# Patient Record
Sex: Male | Born: 1950
Health system: Southern US, Community
[De-identification: ages and names within clinical notes are randomized; demographics above are authoritative.]

## PROBLEM LIST (undated history)

## (undated) DIAGNOSIS — E119 Type 2 diabetes mellitus without complications: Secondary | ICD-10-CM

## (undated) DIAGNOSIS — I10 Essential (primary) hypertension: Secondary | ICD-10-CM

## (undated) DIAGNOSIS — I255 Ischemic cardiomyopathy: Secondary | ICD-10-CM

## (undated) DIAGNOSIS — Z1159 Encounter for screening for other viral diseases: Secondary | ICD-10-CM

## (undated) DIAGNOSIS — I25119 Atherosclerotic heart disease of native coronary artery with unspecified angina pectoris: Secondary | ICD-10-CM

## (undated) DIAGNOSIS — K219 Gastro-esophageal reflux disease without esophagitis: Secondary | ICD-10-CM

## (undated) DIAGNOSIS — E7800 Pure hypercholesterolemia, unspecified: Secondary | ICD-10-CM

## (undated) DIAGNOSIS — N400 Enlarged prostate without lower urinary tract symptoms: Secondary | ICD-10-CM

## (undated) DIAGNOSIS — Z87442 Personal history of urinary calculi: Secondary | ICD-10-CM

## (undated) DIAGNOSIS — R310 Gross hematuria: Secondary | ICD-10-CM

## (undated) DIAGNOSIS — E785 Hyperlipidemia, unspecified: Secondary | ICD-10-CM

## (undated) DIAGNOSIS — Z951 Presence of aortocoronary bypass graft: Secondary | ICD-10-CM

## (undated) DIAGNOSIS — I2510 Atherosclerotic heart disease of native coronary artery without angina pectoris: Secondary | ICD-10-CM

## (undated) DIAGNOSIS — C801 Malignant (primary) neoplasm, unspecified: Secondary | ICD-10-CM

## (undated) DIAGNOSIS — R351 Nocturia: Secondary | ICD-10-CM

## (undated) DIAGNOSIS — N200 Calculus of kidney: Secondary | ICD-10-CM

## (undated) DIAGNOSIS — R350 Frequency of micturition: Secondary | ICD-10-CM

## (undated) DIAGNOSIS — Z87898 Personal history of other specified conditions: Secondary | ICD-10-CM

## (undated) DIAGNOSIS — I252 Old myocardial infarction: Secondary | ICD-10-CM

## (undated) DIAGNOSIS — I509 Heart failure, unspecified: Secondary | ICD-10-CM

## (undated) DIAGNOSIS — I219 Acute myocardial infarction, unspecified: Secondary | ICD-10-CM

## (undated) DIAGNOSIS — Z8679 Personal history of other diseases of the circulatory system: Secondary | ICD-10-CM

---

## 1974-09-12 HISTORY — PX: URETEROLITHOTOMY: SHX71

## 1976-09-12 HISTORY — PX: KNEE ARTHROSCOPY: SUR90

## 1977-09-12 HISTORY — PX: PERCUTANEOUS NEPHROSTOLITHOTOMY: SHX2207

## 1999-10-08 HISTORY — PX: OTHER SURGICAL HISTORY: SHX169

## 2000-09-12 HISTORY — PX: OTHER SURGICAL HISTORY: SHX169

## 2001-03-21 ENCOUNTER — Encounter: Admission: RE | Admit: 2001-03-21 | Discharge: 2001-03-21 | Payer: Self-pay | Admitting: Family Medicine

## 2001-03-21 ENCOUNTER — Encounter: Payer: Self-pay | Admitting: Family Medicine

## 2004-09-12 DIAGNOSIS — Z951 Presence of aortocoronary bypass graft: Secondary | ICD-10-CM

## 2004-09-12 HISTORY — DX: Presence of aortocoronary bypass graft: Z95.1

## 2004-10-11 ENCOUNTER — Ambulatory Visit (HOSPITAL_BASED_OUTPATIENT_CLINIC_OR_DEPARTMENT_OTHER): Admission: RE | Admit: 2004-10-11 | Discharge: 2004-10-11 | Payer: Self-pay | Admitting: Orthopedic Surgery

## 2004-10-11 ENCOUNTER — Ambulatory Visit (HOSPITAL_COMMUNITY): Admission: RE | Admit: 2004-10-11 | Discharge: 2004-10-11 | Payer: Self-pay | Admitting: Orthopedic Surgery

## 2004-10-11 HISTORY — PX: SHOULDER ARTHROSCOPY W/ SUBACROMIAL DECOMPRESSION AND DISTAL CLAVICLE EXCISION: SHX2401

## 2005-04-12 HISTORY — PX: CORONARY ARTERY BYPASS GRAFT: SHX141

## 2005-05-19 ENCOUNTER — Encounter (HOSPITAL_COMMUNITY): Admission: RE | Admit: 2005-05-19 | Discharge: 2005-08-17 | Payer: Self-pay | Admitting: Cardiology

## 2005-09-02 ENCOUNTER — Encounter: Admission: RE | Admit: 2005-09-02 | Discharge: 2005-09-02 | Payer: Self-pay | Admitting: Family Medicine

## 2005-09-12 DIAGNOSIS — Z8679 Personal history of other diseases of the circulatory system: Secondary | ICD-10-CM

## 2005-09-12 HISTORY — DX: Personal history of other diseases of the circulatory system: Z86.79

## 2007-11-02 ENCOUNTER — Emergency Department (HOSPITAL_COMMUNITY): Admission: EM | Admit: 2007-11-02 | Discharge: 2007-11-02 | Payer: Self-pay | Admitting: Emergency Medicine

## 2008-01-17 HISTORY — PX: CARDIAC CATHETERIZATION: SHX172

## 2008-01-29 ENCOUNTER — Encounter: Admission: RE | Admit: 2008-01-29 | Discharge: 2008-01-29 | Payer: Self-pay | Admitting: Sports Medicine

## 2008-02-22 ENCOUNTER — Encounter: Admission: RE | Admit: 2008-02-22 | Discharge: 2008-02-22 | Payer: Self-pay | Admitting: Sports Medicine

## 2008-08-26 ENCOUNTER — Ambulatory Visit (HOSPITAL_COMMUNITY): Admission: RE | Admit: 2008-08-26 | Discharge: 2008-08-26 | Payer: Self-pay | Admitting: Orthopedic Surgery

## 2008-09-10 ENCOUNTER — Inpatient Hospital Stay (HOSPITAL_COMMUNITY): Admission: RE | Admit: 2008-09-10 | Discharge: 2008-09-11 | Payer: Self-pay | Admitting: Orthopedic Surgery

## 2008-09-10 HISTORY — PX: LUMBAR FUSION: SHX111

## 2009-05-23 ENCOUNTER — Emergency Department (HOSPITAL_COMMUNITY): Admission: EM | Admit: 2009-05-23 | Discharge: 2009-05-23 | Payer: Self-pay | Admitting: Emergency Medicine

## 2009-05-26 ENCOUNTER — Ambulatory Visit (HOSPITAL_COMMUNITY): Admission: RE | Admit: 2009-05-26 | Discharge: 2009-05-26 | Payer: Self-pay | Admitting: Urology

## 2009-10-07 ENCOUNTER — Ambulatory Visit (HOSPITAL_COMMUNITY): Admission: RE | Admit: 2009-10-07 | Discharge: 2009-10-07 | Payer: Self-pay | Admitting: Urology

## 2009-10-15 ENCOUNTER — Ambulatory Visit (HOSPITAL_COMMUNITY): Admission: RE | Admit: 2009-10-15 | Discharge: 2009-10-15 | Payer: Self-pay | Admitting: Urology

## 2009-10-15 HISTORY — PX: EXTRACORPOREAL SHOCK WAVE LITHOTRIPSY: SHX1557

## 2010-01-14 ENCOUNTER — Ambulatory Visit (HOSPITAL_COMMUNITY): Admission: RE | Admit: 2010-01-14 | Discharge: 2010-01-14 | Payer: Self-pay | Admitting: Urology

## 2010-01-14 ENCOUNTER — Emergency Department (HOSPITAL_COMMUNITY): Admission: EM | Admit: 2010-01-14 | Discharge: 2010-01-14 | Payer: Self-pay | Admitting: Emergency Medicine

## 2010-01-14 HISTORY — PX: OTHER SURGICAL HISTORY: SHX169

## 2010-01-19 HISTORY — PX: TURP VAPORIZATION: SUR1397

## 2010-01-20 ENCOUNTER — Inpatient Hospital Stay (HOSPITAL_COMMUNITY): Admission: RE | Admit: 2010-01-20 | Discharge: 2010-01-21 | Payer: Self-pay | Admitting: Urology

## 2010-11-28 LAB — COMPREHENSIVE METABOLIC PANEL
AST: 24 U/L (ref 0–37)
Albumin: 3.9 g/dL (ref 3.5–5.2)
Alkaline Phosphatase: 68 U/L (ref 39–117)
Chloride: 105 mEq/L (ref 96–112)
GFR calc Af Amer: >60 mL/min (ref >60)
Potassium: 5.2 mEq/L — ABNORMAL HIGH (ref 3.5–5.1)
Total Bilirubin: 1.5 mg/dL — ABNORMAL HIGH (ref 0.3–1.2)

## 2010-11-28 LAB — GLUCOSE, CAPILLARY: Glucose-Capillary: 154 mg/dL — ABNORMAL HIGH (ref 70–99)

## 2010-11-30 LAB — BASIC METABOLIC PANEL
BUN: 10 mg/dL (ref 6–23)
BUN: 12 mg/dL (ref 6–23)
CO2: 28 mEq/L (ref 19–32)
CO2: 28 mEq/L (ref 19–32)
Chloride: 105 mEq/L (ref 96–112)
GFR calc non Af Amer: >60 mL/min (ref >60)
Glucose, Bld: 145 mg/dL — ABNORMAL HIGH (ref 70–99)
Glucose, Bld: 190 mg/dL — ABNORMAL HIGH (ref 70–99)
Potassium: 4 mEq/L (ref 3.5–5.1)
Potassium: 5 mEq/L (ref 3.5–5.1)
Sodium: 137 mEq/L (ref 135–145)

## 2010-11-30 LAB — COMPREHENSIVE METABOLIC PANEL
AST: 22 U/L (ref 0–37)
Albumin: 3.8 g/dL (ref 3.5–5.2)
Alkaline Phosphatase: 66 U/L (ref 39–117)
Chloride: 106 mEq/L (ref 96–112)
Creatinine, Ser: 1 mg/dL (ref 0.4–1.5)
GFR calc Af Amer: >60 mL/min (ref >60)
Potassium: 4.3 mEq/L (ref 3.5–5.1)
Total Bilirubin: 1.8 mg/dL — ABNORMAL HIGH (ref 0.3–1.2)

## 2010-11-30 LAB — GLUCOSE, CAPILLARY
Glucose-Capillary: 134 mg/dL — ABNORMAL HIGH (ref 70–99)
Glucose-Capillary: 140 mg/dL — ABNORMAL HIGH (ref 70–99)
Glucose-Capillary: 160 mg/dL — ABNORMAL HIGH (ref 70–99)
Glucose-Capillary: 160 mg/dL — ABNORMAL HIGH (ref 70–99)
Glucose-Capillary: 167 mg/dL — ABNORMAL HIGH (ref 70–99)
Glucose-Capillary: 201 mg/dL — ABNORMAL HIGH (ref 70–99)

## 2010-11-30 LAB — URINALYSIS, ROUTINE W REFLEX MICROSCOPIC
Glucose, UA: NEGATIVE mg/dL
Nitrite: NEGATIVE
Protein, ur: >300 mg/dL — AB

## 2010-11-30 LAB — URINE MICROSCOPIC-ADD ON

## 2010-12-17 LAB — COMPREHENSIVE METABOLIC PANEL
ALT: 20 U/L (ref 0–53)
AST: 23 U/L (ref 0–37)
CO2: 27 mEq/L (ref 19–32)
Calcium: 8.7 mg/dL (ref 8.4–10.5)
Chloride: 103 mEq/L (ref 96–112)
Creatinine, Ser: 1.41 mg/dL (ref 0.4–1.5)
GFR calc non Af Amer: 52 mL/min — ABNORMAL LOW (ref >60)
Glucose, Bld: 167 mg/dL — ABNORMAL HIGH (ref 70–99)
Total Bilirubin: 2.3 mg/dL — ABNORMAL HIGH (ref 0.3–1.2)

## 2010-12-17 LAB — LIPASE, BLOOD: Lipase: 20 U/L (ref 11–59)

## 2010-12-17 LAB — CBC
HCT: 40.8 % (ref 39.0–52.0)
Hemoglobin: 13.9 g/dL (ref 13.0–17.0)
MCHC: 34.2 g/dL (ref 30.0–36.0)
MCV: 90.8 fL (ref 78.0–100.0)
RBC: 4.49 MIL/uL (ref 4.22–5.81)
WBC: 10.1 10*3/uL (ref 4.0–10.5)

## 2010-12-17 LAB — URINE CULTURE
Colony Count: NO GROWTH
Culture: NO GROWTH

## 2010-12-17 LAB — URINALYSIS, ROUTINE W REFLEX MICROSCOPIC
Bilirubin Urine: NEGATIVE
Ketones, ur: NEGATIVE mg/dL
Nitrite: NEGATIVE
Urobilinogen, UA: 1 mg/dL (ref 0.0–1.0)
pH: 6.5 (ref 5.0–8.0)

## 2010-12-17 LAB — DIFFERENTIAL
Basophils Absolute: 0 10*3/uL (ref 0.0–0.1)
Eosinophils Absolute: 0.2 10*3/uL (ref 0.0–0.7)
Eosinophils Relative: 2 % (ref 0–5)
Lymphocytes Relative: 10 % — ABNORMAL LOW (ref 12–46)
Lymphs Abs: 1 10*3/uL (ref 0.7–4.0)
Neutrophils Relative %: 81 % — ABNORMAL HIGH (ref 43–77)

## 2010-12-17 LAB — GLUCOSE, CAPILLARY: Glucose-Capillary: 155 mg/dL — ABNORMAL HIGH (ref 70–99)

## 2011-01-25 NOTE — Op Note (Signed)
NAMEXYLER, METZE NO.:  [id number]   MEDICAL RECORD NO.:  [id number]          PATIENT TYPE:  INP   LOCATION:  5039                         FACILITY:  [facility]   PHYSICIAN:  Dimas Alexandria, MD      DATE OF BIRTH:  1950-12-21   DATE OF PROCEDURE:  09/10/2008  DATE OF DISCHARGE:                               OPERATIVE REPORT   SURGEON:  Dimas Alexandria, MD   ASSISTANT:  Gerri Lins, PA-C   PREOPERATIVE DIAGNOSIS:  L4-L5 stenosis.   POSTOPERATIVE DIAGNOSIS:  L4-L5 stenosis.   PROCEDURE:  Insertion of X-STOP (12 mm), L4-L5.   OPERATIVE NOTE:  The patient was placed under general endotracheal  anesthesia.  Sequential compression devices were placed on both lower  extremities.  He was positioned prone on the Wink frame.  Care was  taken to position the upper extremities so as to avoid hyperflexion and  abduction of the shoulders and so as to avoid hyperflexion of the  elbows.  The upper extremities were padded from hands to axilla with  foam.  The thighs, knees, shins, ankles were carefully supported on  pillows.  The patient received intravenous prophylactic antibiotics.   The lumbar area was prepped with DuraPrep and draped in rectangular  fashion.  The drapes were secured with Ioban.   A time-out was held at which point in time the usual information was  confirmed with regards to the patient's identity, underlying diagnosis,  proposed procedure, etc. etc.   With lateral fluoroscopy, the L4-L5 level was identified.  A midline  incision was made at that level after injecting the paraspinal fascia  and subcutaneous tissue with a mixture 0.5% plain Marcaine and 1%  lidocaine.  Dissection was carried down to the spinous processes at the  L4-L5 level.  We were careful to not disrupt the supraspinous ligament.  Paraspinal muscles were reflected bilaterally to about the medial  portion of the facet joints.   We then placed an initial small dilator between the  base of the L4 and  L5 spinous process and confirmed level again under lateral fluoroscopic  guidance.  We then placed the next fixed diameter dilator without  difficulty.  We then placed the expandable dilator.  The interspinous  space could be easily expanded to 12 mm with 2 fingers pressure on the  instrument.  At 14 mm, there was a definite marked increase in the  amount of pressure required and it was my sense that we would disrupt  the interspinous and supraspinous ligaments beyond 14 mm.  We chose a 12-  mm barrel X-STOP titanium prosthesis.  This was inserted without  difficulty and position confirmed with AP and lateral fluoroscopic views  after we had attached the wing to the left side having inserted the  barrel from the right side.   There was insignificant bleeding, less than 20 mL.  The thoracolumbar  fascia was closed using interrupted 0-Vicryl sutures.  Subcutaneous  tissue was closed using inverted 2-0 Vicryl sutures.  The skin was  closed using subcuticular  3-0 running undyed Vicryl suture.  The wound  edges were reinforced using  Steri-Strips.  An antibiotic ointment dressing was applied and secured  with OpSite.  The patient was transferred to his bed.  At the time of  dictation, he has not been examined in the recovery room.      Dimas Alexandria, MD  Electronically Signed     MT/MEDQ  D:  09/10/2008  T:  09/10/2008  Job:  [id number]

## 2011-01-28 NOTE — Op Note (Signed)
NAMETRANCE, TREFRY               ACCOUNT NO.:  [id number]   MEDICAL RECORD NO.:  [id number]          PATIENT TYPE:  AMB   LOCATION:  [place]                          FACILITY:  [facility]   PHYSICIAN:  Robert A. Noemi Chapel, M.D. DATE OF BIRTH:  04-15-1951   DATE OF PROCEDURE:  10/11/2004  DATE OF DISCHARGE:                                 OPERATIVE REPORT   PREOPERATIVE DIAGNOSES:  1.  Right shoulder partial rotator cuff tear.  2.  Right shoulder adhesive capsulitis.  3.  Right shoulder impingement.  4.  Right shoulder acromioclavicular joint spurring and degenerative joint      disease.   POSTOPERATIVE DIAGNOSES:  1.  Right shoulder partial rotator cuff tear.  2.  Right shoulder adhesive capsulitis.  3.  Right shoulder impingement.  4.  Right shoulder acromioclavicular joint spurring and degenerative joint      disease.   PROCEDURES:  1.  Right shoulder examination under anesthesia, followed by  manipulation.  2.  Right shoulder arthroscopic debridement, partial rotator cuff tear.  3.  Right shoulder arthroscopic lysis of adhesions.  4.  Right shoulder subacromial decompression.  5.  Right shoulder distal clavicle excision.   SURGEON:  Audree Camel. Noemi Chapel, M.D.   ASSISTANT:  Matthew Saras, P.A.   ANESTHESIA:  General.   OPERATIVE TIME:  45 minutes.   COMPLICATIONS:  None.   INDICATION FOR PROCEDURE:  Mr. Rohm is a 60 year old gentleman who was  injured at work on June 14, 2004, injuring his right shoulder at that  time.  Persistent pain with loss of motion with an MRI exam documenting  partial rotator cuff tear with impingement and AC joint spurring and  adhesive capsulitis, who has failed conservative care and is now to undergo  arthroscopy.   DESCRIPTION:  Mr. Keener was brought to the operating room on October 11, 2004, after an interscalene block had been placed in the holding room by  anesthesia for postoperative pain control.  He was placed on the operating  table  in supine position.  After being placed under general anesthesia, his  right shoulder was examined.  Initial range of motion showed forward flexion  of 90, abduction of 80, internal and external rotation of 50 degrees.  A  gentle manipulation was carried out, breaking up adhesions and improving  forward flexion to 175, abduction to 175, internal and external rotation of  85 degrees.  The shoulder remained stable to ligamentous exam.  He was then  placed in the beach chair position and the shoulder and arm were prepped  using sterile DuraPrep and draped using sterile technique.  He received  Ancef 1 g IV preoperatively for prophylaxis. Initially through a posterior  arthroscopic portal, the arthroscope with a pump attached was placed in  through an anterior portal, and arthroscopic probe was placed.  On initial  inspection the articular cartilage in the glenohumeral joint was intact.  The anterior labrum was intact.  Superior labrum, moderate fraying, which  was debrided partial.  Biceps tendon anchor and biceps tendon were intact.  The inferior labrum and anterior inferior glenohumeral  ligament were intact.  The posterior ligament was intact.  The rotator cuff on the articular  surface showed a partial tear of 25%, which was debrided.  It was otherwise  intact.  There was a large amount of hemorrhagic synovitis consistent with  his adhesive capsulitis, and this was partially debrided and the small  synovial bleeders were cauterized.  The inferior capsular recess was free of  pathology.  After this was done, the subacromial space was entered and a  lateral arthroscopic portal was made.  A large amount of bursitis was  resected.  The rotator cuff showed partial tearing on the bursal surface, 30-  40%, which was debrided.  Impingement was noted and a subacromial  decompression was carried out, removing 6-8 mm of the undersurface of the  anterior, anterolateral and anteromedial acromion, and CA  ligament release  carried out as well.  The [facility] joint showed significant spurring and  degenerative changes, and the distal 5-6 mm of clavicle was resected with a  6 mm bur.  After this was done, the shoulder could be brought through a full  range of motion with no impingement on the rotator cuff.  At this point it  was felt that all pathology had been satisfactorily addressed.  The  instruments were removed.  The portals were closed with 4-0 nylon suture,  sterile dressings and a sling applied, with the patient awakened and taken  to the recovery room in stable condition.   FOLLOW-UP CARE:  Mr. Weister will be followed as an outpatient on Percocet and  Naprosyn with early aggressive physical therapy.  He will be seen back in  the office in a week for sutures out and follow-up.      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

## 2011-06-17 LAB — URINALYSIS, ROUTINE W REFLEX MICROSCOPIC
Ketones, ur: NEGATIVE mg/dL
Nitrite: NEGATIVE
Protein, ur: NEGATIVE mg/dL
Protein, ur: NEGATIVE mg/dL
Urobilinogen, UA: 2 mg/dL — ABNORMAL HIGH (ref 0.0–1.0)
pH: 7 (ref 5.0–8.0)

## 2011-06-17 LAB — BASIC METABOLIC PANEL
BUN: 15 mg/dL (ref 6–23)
CO2: 29 mEq/L (ref 19–32)
Calcium: 8.1 mg/dL — ABNORMAL LOW (ref 8.4–10.5)
Chloride: 104 mEq/L (ref 96–112)
Creatinine, Ser: 0.95 mg/dL (ref 0.4–1.5)
Creatinine, Ser: 1.12 mg/dL (ref 0.4–1.5)
GFR calc Af Amer: >60 mL/min (ref >60)
GFR calc non Af Amer: >60 mL/min (ref >60)

## 2011-06-17 LAB — DIFFERENTIAL
Basophils Absolute: 0 10*3/uL (ref 0.0–0.1)
Basophils Relative: 0 % (ref 0–1)
Eosinophils Absolute: 0.1 10*3/uL (ref 0.0–0.7)
Lymphocytes Relative: 17 % (ref 12–46)
Lymphs Abs: 1.2 10*3/uL (ref 0.7–4.0)
Monocytes Absolute: 0.5 10*3/uL (ref 0.1–1.0)
Neutro Abs: 5 10*3/uL (ref 1.7–7.7)
Neutrophils Relative %: 70 % (ref 43–77)

## 2011-06-17 LAB — CBC
HCT: 42.6 % (ref 39.0–52.0)
Hemoglobin: 14.3 g/dL (ref 13.0–17.0)
MCHC: 33.5 g/dL (ref 30.0–36.0)
MCHC: 34.2 g/dL (ref 30.0–36.0)
MCV: 89.7 fL (ref 78.0–100.0)
MCV: 90 fL (ref 78.0–100.0)
Platelets: 168 10*3/uL (ref 150–400)
Platelets: 173 10*3/uL (ref 150–400)
RBC: 3.98 MIL/uL — ABNORMAL LOW (ref 4.22–5.81)
RBC: 4.73 MIL/uL (ref 4.22–5.81)
RDW: 14.4 % (ref 11.5–15.5)
RDW: 14.7 % (ref 11.5–15.5)
WBC: 7 10*3/uL (ref 4.0–10.5)
WBC: 8.7 10*3/uL (ref 4.0–10.5)

## 2011-06-17 LAB — VITAMIN D 25 HYDROXY (VIT D DEFICIENCY, FRACTURES): Vit D, 25-Hydroxy: 20 ng/mL — ABNORMAL LOW (ref 30–89)

## 2011-06-17 LAB — GLUCOSE, CAPILLARY
Glucose-Capillary: 102 mg/dL — ABNORMAL HIGH (ref 70–99)
Glucose-Capillary: 109 mg/dL — ABNORMAL HIGH (ref 70–99)
Glucose-Capillary: 197 mg/dL — ABNORMAL HIGH (ref 70–99)
Glucose-Capillary: 87 mg/dL (ref 70–99)

## 2011-06-17 LAB — COMPREHENSIVE METABOLIC PANEL
Albumin: 3.9 g/dL (ref 3.5–5.2)
BUN: 13 mg/dL (ref 6–23)
Calcium: 9.3 mg/dL (ref 8.4–10.5)
Chloride: 106 mEq/L (ref 96–112)
Creatinine, Ser: 0.92 mg/dL (ref 0.4–1.5)
GFR calc Af Amer: >60 mL/min (ref >60)
Total Bilirubin: 1.9 mg/dL — ABNORMAL HIGH (ref 0.3–1.2)
Total Protein: 7 g/dL (ref 6.0–8.3)

## 2011-06-17 LAB — HEPATIC FUNCTION PANEL
Albumin: 3.8 g/dL (ref 3.5–5.2)
Total Bilirubin: 2 mg/dL — ABNORMAL HIGH (ref 0.3–1.2)
Total Protein: 6.7 g/dL (ref 6.0–8.3)

## 2011-06-17 LAB — TYPE AND SCREEN
ABO/RH(D): A POS
Antibody Screen: NEGATIVE

## 2011-06-17 LAB — PROTIME-INR
INR: 1 (ref 0.00–1.49)
Prothrombin Time: 13.5 seconds (ref 11.6–15.2)

## 2011-06-17 LAB — URINE MICROSCOPIC-ADD ON

## 2011-06-17 LAB — APTT: aPTT: 31 seconds (ref 24–37)

## 2012-01-09 DIAGNOSIS — I519 Heart disease, unspecified: Secondary | ICD-10-CM | POA: Insufficient documentation

## 2012-01-09 DIAGNOSIS — I219 Acute myocardial infarction, unspecified: Secondary | ICD-10-CM | POA: Insufficient documentation

## 2012-01-09 DIAGNOSIS — I5189 Other ill-defined heart diseases: Secondary | ICD-10-CM | POA: Insufficient documentation

## 2012-01-09 HISTORY — PX: TRANSTHORACIC ECHOCARDIOGRAM: SHX275

## 2012-10-12 ENCOUNTER — Other Ambulatory Visit: Payer: Self-pay | Admitting: Urology

## 2012-10-15 ENCOUNTER — Encounter (HOSPITAL_BASED_OUTPATIENT_CLINIC_OR_DEPARTMENT_OTHER): Payer: Self-pay | Admitting: *Deleted

## 2012-10-15 NOTE — Progress Notes (Signed)
NPO AFTER MN. ARRIVES AT 0700. NEEDS ISTAT AND CXR.  LAST OFFICE NOTE, STRESS TEST AND EKG TO BE FAXED FROM DR Frederico Hamman AT [facility]. WILL TAKE COREG AM OF SURG W/ SIP OF WATER.

## 2012-10-15 NOTE — H&P (Signed)
Russell For Visit     Seen today for painless gross hematuria X 3 days.   Active Problems Problems  1. Benign Prostatic Hypertrophy With Urinary Obstruction 600.01 2. Foreign Body In The Bladder 939.0 3. Gross Hematuria 599.71 4. Nephrolithiasis Of Both Kidneys 592.0  History of Present Illness              62 YO male patient of Russell Trevino seen today for complaint of painless gross hematuria X 3 days.   GU HX:  S/P TURP on 01/18/10.  He had been on Avodart because of his very large prostate but now has been off X 2+ yrs.  Post TURP epididymitis.    Hx of stones. Stone analysis [id number]: calicum oxlate.  Hx of persistent microhematuria.       Hx of progressive ED with no effective erections and he has tried samples of a PDE5 without success.     Interval HX:  Today states that he developed painless gross hematuria 3 days ago. This am difficulty voiding and passing small clots. No flank pain, no recent passage of stone material, or f/c. Does take Plavix 75 mg 1 po daily but did not take this am. H/O remote tobacco use >25 yrs ago.   Past Medical History Problems  1. History of  Abdominal Pain In The Right Upper Belly (RUQ) 789.01 2. History of  Acute Urinary Retention 788.20 3. History of  Bladder Calculus V13.01 4. History of  Coronary Artery Disease V12.59 5. History of  Diabetes Mellitus 250.00 6. History of  Distal Ureteral Stone On The Right 592.1 7. History of  Epididymitis Left 604.90 8. History of  Gross Hematuria 599.71 9. History of  Nausea With Vomiting 787.01 10. History of  Nephrolithiasis V13.01 11. History of  Nephrolithiasis Of The Right Kidney V13.01 12. History of  Pyuria 791.9 13. History of  Urge Incontinence Of Urine 788.31  Surgical History Problems  1. History of  Back Surgery 2009 2. History of  CABG (CABG) V45.81 3. History of  Cystoscopy With Complicated Removal Of Object 4. History of  Cystoscopy With Insertion Of Ureteral Stent Right 5. History of   Cystoscopy With Removal Of Object 6. History of  Cystoscopy With Ureteroscopy 7. History of  Lithotomy Left 8. History of  Lithotripsy 9. History of  Transurethral Resection Of Prostate (TURP)  Current Meds 1. Avodart 0.5 MG Oral Capsule; Take one (1) capsule(s) once daily; Therapy: 20Jun2011 to  (Evaluate:26Jan2013)  Requested for: [phone]; Last Rx:01Feb2012; Status: ACTIVE -  Renewal Denied 2. HumaLOG Mix 75/25 KwikPen (75-25) 100 UNIT/ML Subcutaneous Suspension; Therapy:  [id number] to 3. Janumet TABS; Therapy: (Recorded:13Sep2010) to 4. Lipitor TABS; Therapy: (Recorded:13Sep2010) to 5. Lisinopril TABS; Therapy: (Recorded:13Sep2010) to 6. Plavix TABS; Therapy: (Recorded:13Sep2010) to  Family History Problems  1. Family history of  Death In The Family Father died age 59-heart attack 2. Maternal history of  Diabetes Mellitus V18.0 3. Paternal history of  Diabetes Mellitus V18.0 4. Family history of  Family Health Status Number Of Children 1 son and 3 daughters 63. Paternal history of  Nephrolithiasis 6. Maternal history of  Renal Failure  Social History Problems  1. Being A Social Drinker 2. Caffeine Use 4 per day 3. Former Smoker smoked less than a ppd for 5 yrs and quit 23 yrs ago 4. Marital History - Currently Married 5. Occupation: retired  Review of Systems Genitourinary, constitutional, skin, eye, otolaryngeal, hematologic/lymphatic, cardiovascular, pulmonary, endocrine, musculoskeletal, gastrointestinal, neurological and psychiatric system(s) were  reviewed and pertinent findings if present are noted.  Genitourinary: difficulty starting the urinary stream, hematuria and initiating urination requires straining.    Vitals Vital Signs [Data Includes: Last 1 Day]  [id number] 08:24AM  Blood Pressure: 151 / 83 Temperature: 98.1 F Heart Rate: 70  Physical Exam Constitutional: Well nourished and well developed . No acute distress. The patient appears well hydrated.   Abdomen: The abdomen is obese. The abdomen is soft and nontender. No suprapubic tenderness. No CVA tenderness. No hernias are palpable.  Rectal: Rectal exam demonstrates normal sphincter tone, the anus is normal on inspection. and no tenderness. Estimated prostate size is 3+. Normal rectal tone, no rectal masses, prostate is smooth, symmetric and non-tender. The perineum is normal on inspection, no perineal tenderness.  Genitourinary: Examination of the penis demonstrates a normal meatus. The penis is circumcised. The scrotum is normal in appearance. The right testis is palpably normal, not enlarged and non-tender. The left testis is normal, not enlarged and non-tender.  Skin: Normal skin turgor and normal skin color and pigmentation.  Neuro/Psych:. Mood and affect are appropriate.    Results/Data Urine [Data Includes: Last 1 Day]   [id number]  COLOR RED   APPEARANCE TURBID   SQUAMOUS EPITHELIAL/HPF RARE   WBC 0-2 WBC/hpf  RBC TNTC RBC/hpf  BACTERIA FEW   CRYSTALS NONE SEEN   CASTS NONE SEEN    The following images/tracing/specimen were independently visualized:  CT hematuria protocol: bilateral non-obstructing renal calculi. Bilateral renal cysts. Large prostate seen indenting bladder with large bladder mass vs clot material. Post contrast shows no filling defect of bilateral ureters.  The following clinical lab reports were reviewed:  Stat CBC/diff, BUN/creatitine, CMET, PSA, PT/INR, PTT.  PVR: Ultrasound PVR 122 ml.    Assessment Assessed  1. Gross Hematuria 599.71 2. Benign Prostatic Hypertrophy With Urinary Obstruction 600.01 3. Foreign Body In The Bladder 939.0 4. Nephrolithiasis Of Both Kidneys 592.0      Will have pt resume Avodart 0.5 mg 1 po daily. Will hold Plavix X 5 days. Will send urine for cytology/reflex to [facility]. Instruted to push po fluids to flush bladder. Will have pt RTC for cysto w/Dr. Jeffie Pollock. Instructed to go to [facility] ER if he develops clot retention.   Plan  Benign  Prostatic Hypertrophy With Urinary Obstruction (600.01)  1. Avodart 0.5 MG Oral Capsule; TAKE 1 CAPSULE EVERY DAY; Therapy: [id number] to (Last  [id number])  Requested for: [id number]; Edited 2. PSA REFLEX TO FREE  Done: [id number] 3. PVR U/S  Done: [id number] Foreign Body In The Bladder (939.0)  4. Follow-up Office  Follow-up  Requested for: [id number] Gross Hematuria (599.71)  5. CBC W/DIFF  Done: [id number] 08:46AM 6. CMP-CT  Done: [id number] 08:49AM 7. COMPREHENSIVE METABOLIC PANEL 8. PROTHROMBIN TIME (PT)  Done: [id number] 08:46AM 9. VENIPUNCTURE  [id number] Gross Hematuria (599.71), Foreign Body In The Bladder (939.0)  10. Cysto  Requested for: [id number] 11. URINE CYTOLOGY W/REFLEX FISH  Done: [id number]         Stat CBC/diff, BUN/creatitine, CMET, PT/INR, PTT, and PSA today Avodart 0.5 mg 1 po daily #30/2RF RTC for cysto

## 2012-10-16 ENCOUNTER — Ambulatory Visit (HOSPITAL_COMMUNITY): Payer: BC Managed Care – PPO

## 2012-10-16 ENCOUNTER — Ambulatory Visit (HOSPITAL_BASED_OUTPATIENT_CLINIC_OR_DEPARTMENT_OTHER)
Admission: RE | Admit: 2012-10-16 | Discharge: 2012-10-16 | Disposition: A | Discharge status: Home / Self Care | Payer: BC Managed Care – PPO | Source: Ambulatory Visit | Attending: Urology | Admitting: Urology

## 2012-10-16 ENCOUNTER — Ambulatory Visit (HOSPITAL_BASED_OUTPATIENT_CLINIC_OR_DEPARTMENT_OTHER): Payer: BC Managed Care – PPO | Admitting: Anesthesiology

## 2012-10-16 ENCOUNTER — Encounter (HOSPITAL_BASED_OUTPATIENT_CLINIC_OR_DEPARTMENT_OTHER): Payer: Self-pay | Admitting: *Deleted

## 2012-10-16 ENCOUNTER — Encounter (HOSPITAL_BASED_OUTPATIENT_CLINIC_OR_DEPARTMENT_OTHER): Payer: Self-pay | Admitting: Anesthesiology

## 2012-10-16 ENCOUNTER — Encounter (HOSPITAL_BASED_OUTPATIENT_CLINIC_OR_DEPARTMENT_OTHER)
Admission: RE | Disposition: A | Discharge status: Home / Self Care | Payer: Self-pay | Source: Ambulatory Visit | Attending: Urology

## 2012-10-16 DIAGNOSIS — Z951 Presence of aortocoronary bypass graft: Secondary | ICD-10-CM | POA: Insufficient documentation

## 2012-10-16 DIAGNOSIS — C61 Malignant neoplasm of prostate: Secondary | ICD-10-CM | POA: Insufficient documentation

## 2012-10-16 DIAGNOSIS — N201 Calculus of ureter: Secondary | ICD-10-CM | POA: Insufficient documentation

## 2012-10-16 DIAGNOSIS — Z01810 Encounter for preprocedural cardiovascular examination: Secondary | ICD-10-CM | POA: Insufficient documentation

## 2012-10-16 DIAGNOSIS — N4283 Cyst of prostate: Secondary | ICD-10-CM | POA: Insufficient documentation

## 2012-10-16 DIAGNOSIS — E119 Type 2 diabetes mellitus without complications: Secondary | ICD-10-CM | POA: Insufficient documentation

## 2012-10-16 DIAGNOSIS — R972 Elevated prostate specific antigen [PSA]: Secondary | ICD-10-CM | POA: Insufficient documentation

## 2012-10-16 DIAGNOSIS — I2510 Atherosclerotic heart disease of native coronary artery without angina pectoris: Secondary | ICD-10-CM | POA: Insufficient documentation

## 2012-10-16 DIAGNOSIS — Z79899 Other long term (current) drug therapy: Secondary | ICD-10-CM | POA: Insufficient documentation

## 2012-10-16 DIAGNOSIS — N3289 Other specified disorders of bladder: Secondary | ICD-10-CM | POA: Insufficient documentation

## 2012-10-16 HISTORY — DX: Gross hematuria: R31.0

## 2012-10-16 HISTORY — DX: Type 2 diabetes mellitus without complications: E11.9

## 2012-10-16 HISTORY — DX: Personal history of other diseases of the circulatory system: Z86.79

## 2012-10-16 HISTORY — DX: Old myocardial infarction: I25.2

## 2012-10-16 HISTORY — DX: Ischemic cardiomyopathy: I25.5

## 2012-10-16 HISTORY — DX: Atherosclerotic heart disease of native coronary artery without angina pectoris: I25.10

## 2012-10-16 HISTORY — DX: Calculus of kidney: N20.0

## 2012-10-16 HISTORY — DX: Nocturia: R35.1

## 2012-10-16 HISTORY — DX: Presence of aortocoronary bypass graft: Z95.1

## 2012-10-16 HISTORY — DX: Personal history of other specified conditions: Z87.898

## 2012-10-16 HISTORY — PX: CYSTOSCOPY: SHX5120

## 2012-10-16 HISTORY — DX: Hyperlipidemia, unspecified: E78.5

## 2012-10-16 HISTORY — PX: PROSTATE BIOPSY: SHX241

## 2012-10-16 HISTORY — DX: Benign prostatic hyperplasia without lower urinary tract symptoms: N40.0

## 2012-10-16 HISTORY — DX: Personal history of urinary calculi: Z87.442

## 2012-10-16 HISTORY — DX: Frequency of micturition: R35.0

## 2012-10-16 LAB — POCT I-STAT, CHEM 8
HCT: 35 % — ABNORMAL LOW (ref 39.0–52.0)
Hemoglobin: 11.9 g/dL — ABNORMAL LOW (ref 13.0–17.0)
Potassium: 3.9 mEq/L (ref 3.5–5.1)
Sodium: 139 mEq/L (ref 135–145)

## 2012-10-16 SURGERY — CYSTOSCOPY
Anesthesia: General | Site: Prostate | Wound class: Clean Contaminated

## 2012-10-16 MED ORDER — CEFAZOLIN SODIUM 1-5 GM-% IV SOLN
1.0000 g | INTRAVENOUS | Status: DC
  Filled 2012-10-16: qty 50

## 2012-10-16 MED ORDER — SODIUM CHLORIDE 0.9 % IV SOLN
250.0000 mL | INTRAVENOUS | Status: DC | PRN
  Filled 2012-10-16: qty 250

## 2012-10-16 MED ORDER — SODIUM CHLORIDE 0.9 % IJ SOLN
3.0000 mL | INTRAMUSCULAR | Status: DC | PRN
  Filled 2012-10-16: qty 3

## 2012-10-16 MED ORDER — ACETAMINOPHEN 325 MG PO TABS
650.0000 mg | ORAL_TABLET | ORAL | Status: DC | PRN
  Filled 2012-10-16: qty 2

## 2012-10-16 MED ORDER — PROMETHAZINE HCL 25 MG/ML IJ SOLN
6.2500 mg | INTRAMUSCULAR | Status: DC | PRN
  Filled 2012-10-16: qty 1

## 2012-10-16 MED ORDER — FENTANYL CITRATE 0.05 MG/ML IJ SOLN
25.0000 ug | INTRAMUSCULAR | Status: DC | PRN
  Filled 2012-10-16: qty 1

## 2012-10-16 MED ORDER — LIDOCAINE HCL (CARDIAC) 20 MG/ML IV SOLN
INTRAVENOUS | Status: DC | PRN
  Administered 2012-10-16: 100 mg via INTRAVENOUS

## 2012-10-16 MED ORDER — CEFAZOLIN SODIUM-DEXTROSE 2-3 GM-% IV SOLR
2.0000 g | INTRAVENOUS | Status: AC
  Administered 2012-10-16: 2 g via INTRAVENOUS
  Filled 2012-10-16: qty 50

## 2012-10-16 MED ORDER — TRAMADOL HCL 50 MG PO TABS: 50.0000 mg | ORAL_TABLET | Freq: Four times a day (QID) | ORAL | Status: DC | PRN

## 2012-10-16 MED ORDER — ONDANSETRON HCL 4 MG/2ML IJ SOLN
INTRAMUSCULAR | Status: DC | PRN
  Administered 2012-10-16: 4 mg via INTRAVENOUS

## 2012-10-16 MED ORDER — DEXTROSE 5 % IV SOLN
2.0000 g | INTRAVENOUS | Status: DC | PRN
  Administered 2012-10-16: 2 g via INTRAVENOUS

## 2012-10-16 MED ORDER — LACTATED RINGERS IV SOLN
INTRAVENOUS | Status: DC | PRN
  Administered 2012-10-16: 08:00:00 via INTRAVENOUS

## 2012-10-16 MED ORDER — FLEET ENEMA 7-19 GM/118ML RE ENEM
1.0000 | ENEMA | Freq: Once | RECTAL | Status: DC
  Filled 2012-10-16: qty 1

## 2012-10-16 MED ORDER — DEXTROSE 5 % IV SOLN
1.0000 g | Freq: Once | INTRAVENOUS | Status: DC
  Filled 2012-10-16: qty 10

## 2012-10-16 MED ORDER — LACTATED RINGERS IV SOLN
INTRAVENOUS | Status: DC
  Administered 2012-10-16: 08:00:00 via INTRAVENOUS
  Filled 2012-10-16: qty 1000

## 2012-10-16 MED ORDER — ACETAMINOPHEN 650 MG RE SUPP
650.0000 mg | RECTAL | Status: DC | PRN
  Filled 2012-10-16: qty 1

## 2012-10-16 MED ORDER — PROPOFOL 10 MG/ML IV BOLUS
INTRAVENOUS | Status: DC | PRN
  Administered 2012-10-16: 300 mg via INTRAVENOUS

## 2012-10-16 MED ORDER — OXYCODONE HCL 5 MG PO TABS
5.0000 mg | ORAL_TABLET | ORAL | Status: DC | PRN
  Filled 2012-10-16: qty 2

## 2012-10-16 MED ORDER — FENTANYL CITRATE 0.05 MG/ML IJ SOLN
INTRAMUSCULAR | Status: DC | PRN
  Administered 2012-10-16 (×4): 50 ug via INTRAVENOUS

## 2012-10-16 MED ORDER — CIPROFLOXACIN IN D5W 400 MG/200ML IV SOLN
INTRAVENOUS | Status: DC | PRN
  Administered 2012-10-16: 400 mg via INTRAVENOUS

## 2012-10-16 MED ORDER — KETOROLAC TROMETHAMINE 30 MG/ML IJ SOLN
INTRAMUSCULAR | Status: DC | PRN
  Administered 2012-10-16: 30 mg via INTRAVENOUS

## 2012-10-16 MED ORDER — SODIUM CHLORIDE 0.9 % IJ SOLN
3.0000 mL | Freq: Two times a day (BID) | INTRAMUSCULAR | Status: DC
  Filled 2012-10-16: qty 3

## 2012-10-16 MED ORDER — STERILE WATER FOR IRRIGATION IR SOLN
Status: DC | PRN
  Administered 2012-10-16: 3000 mL

## 2012-10-16 MED ORDER — ONDANSETRON HCL 4 MG/2ML IJ SOLN
4.0000 mg | Freq: Four times a day (QID) | INTRAMUSCULAR | Status: DC | PRN
  Filled 2012-10-16: qty 2

## 2012-10-16 MED ORDER — CIPROFLOXACIN HCL 500 MG PO TABS: 500.0000 mg | ORAL_TABLET | Freq: Two times a day (BID) | ORAL | Status: DC

## 2012-10-16 MED ORDER — MIDAZOLAM HCL 5 MG/5ML IJ SOLN
INTRAMUSCULAR | Status: DC | PRN
  Administered 2012-10-16: 2 mg via INTRAVENOUS

## 2012-10-16 MED ORDER — CEFTRIAXONE SODIUM 2 G IJ SOLR
2.0000 g | Freq: Once | INTRAMUSCULAR | Status: DC
  Filled 2012-10-16: qty 2

## 2012-10-16 SURGICAL SUPPLY — 24 items
BAG DRAIN URO-CYSTO SKYTR STRL (DRAIN) ×3 IMPLANT
BAG DRN UROCATH (DRAIN) ×2
CANISTER SUCT LVC 12 LTR MEDI- (MISCELLANEOUS) ×3 IMPLANT
CATH FOLEY 2WAY SLVR  5CC 16FR (CATHETERS)
CATH FOLEY 2WAY SLVR 5CC 16FR (CATHETERS) IMPLANT
CATH URET 5FR 28IN OPEN ENDED (CATHETERS) IMPLANT
CLOTH BEACON ORANGE TIMEOUT ST (SAFETY) ×3 IMPLANT
DRAPE CAMERA CLOSED 9X96 (DRAPES) ×3 IMPLANT
ELECT REM PT RETURN 9FT ADLT (ELECTROSURGICAL)
ELECTRODE REM PT RTRN 9FT ADLT (ELECTROSURGICAL) IMPLANT
GLOVE BIOGEL M 6.5 STRL (GLOVE) ×3 IMPLANT
GLOVE ECLIPSE 6.0 STRL STRAW (GLOVE) ×3 IMPLANT
GLOVE SURG SS PI 8.0 STRL IVOR (GLOVE) ×3 IMPLANT
GOWN STRL REIN XL XLG (GOWN DISPOSABLE) ×3 IMPLANT
GOWN XL W/COTTON TOWEL STD (GOWNS) ×3 IMPLANT
GUIDEWIRE STR DUAL SENSOR (WIRE) IMPLANT
NDL SAFETY ECLIPSE 18X1.5 (NEEDLE) IMPLANT
NEEDLE HYPO 18GX1.5 SHARP (NEEDLE)
NEEDLE HYPO 22GX1.5 SAFETY (NEEDLE) IMPLANT
NS IRRIG 500ML POUR BTL (IV SOLUTION) IMPLANT
PACK CYSTOSCOPY (CUSTOM PROCEDURE TRAY) ×3 IMPLANT
SYR 20CC LL (SYRINGE) IMPLANT
SYR BULB IRRIGATION 50ML (SYRINGE) IMPLANT
WATER STERILE IRR 3000ML UROMA (IV SOLUTION) ×3 IMPLANT

## 2012-10-16 NOTE — Anesthesia Postprocedure Evaluation (Signed)
  Anesthesia Post-op Note  Patient: Russell Trevino  Procedure(s) Performed: Procedure(s) (LRB): CYSTOSCOPY (N/A) PROSTATE BIOPSY (N/A)  Patient Location: PACU  Anesthesia Type: General  Level of Consciousness: awake and alert   Airway and Oxygen Therapy: Patient Spontanous Breathing  Post-op Pain: mild  Post-op Assessment: Post-op Vital signs reviewed, Patient's Cardiovascular Status Stable, Respiratory Function Stable, Patent Airway and No signs of Nausea or vomiting  Last Vitals:  Filed Vitals:   10/16/12 1030  BP: 143/69  Pulse: 63  Temp:   Resp: 10    Post-op Vital Signs: stable   Complications: No apparent anesthesia complications. FBS 211. He will manage this at home today.

## 2012-10-16 NOTE — Transfer of Care (Signed)
Immediate Anesthesia Transfer of Care Note  Patient: Russell Trevino  Procedure(s) Performed: Procedure(s) (LRB): CYSTOSCOPY (N/A) PROSTATE BIOPSY (N/A)  Patient Location: PACU  Anesthesia Type: General  Level of Consciousness: awake, sedated, patient cooperative and responds to stimulation  Airway & Oxygen Therapy: Patient Spontanous Breathing and Patient connected to face mask oxygen  Post-op Assessment: Report given to PACU RN, Post -op Vital signs reviewed and stable and Patient moving all extremities  Post vital signs: Reviewed and stable  Complications: No apparent anesthesia complications

## 2012-10-16 NOTE — Op Note (Signed)
NAMEJAMESROBERT, BRESCIA               ACCOUNT NO.:  [id number]  MEDICAL RECORD NO.:  [id number]  LOCATION:                                 FACILITY:  PHYSICIAN:  Marshall Cork. Jeffie Pollock, M.D.    DATE OF BIRTH:  Jul 24, 1951  DATE OF PROCEDURE: DATE OF DISCHARGE:                              OPERATIVE REPORT   PROCEDURE: 1. Prostate ultrasound and ultrasound-guided biopsy. 2. Cystoscopy with evacuation of clots and fulguration of prostatic     urethral polyps.  PREOPERATIVE DIAGNOSIS:  Gross hematuria with clots and an elevated PSA.  POSTOPERATIVE DIAGNOSIS:  Gross hematuria with clots and an elevated PSA with prostatic urethral polyp.  SURGEON:  Marshall Cork. Jeffie Pollock, M.D.  ANESTHESIA:  General.  SPECIMENS:  Fourteen core prostate biopsy.  DRAINS:  None.  BLOOD LOSS:  Minimal.  COMPLICATIONS:  None.  INDICATIONS:  Russell Trevino is a 62 year old white male with a prior history of stones and BPH with a prior transurethral electrovaporization who presented last week with gross hematuria.  He was on Plavix at that time.  A CT scan demonstrated a large clot in the bladder.  It was felt that cystoscopy was indicated to further evaluate the bleeding and that it should be done in the operating room for greater ease at clot evacuation.  He was also found on lab work from that visit to have a PSA of 10.4.  It has been unable to find any historical PSAs and his prior TUR had been done with electrovaporization, so no specimen was available, so it was felt that prostate ultrasound and biopsy were indicated.  FINDINGS OF PROCEDURE:  He was initially given 2 g of Ancef, but with the addition of the prostate ultrasound, I felt further coverage was indicated so he was given 400 mg of Cipro and 2 g of Rocephin, which is more standard for the prostate ultrasound and biopsy.  He was also given a Fleets enema.  I spoke to the patient and his wife and gave them full informed consent regarding the prostate biopsy  which was added to the schedule this morning.  He was then taken to the operating room where general anesthetic was induced.  He was placed in lithotomy position and fitted with PAS hose.  The 10 MHz transrectal ultrasound probe was then assembled and inserted and scanning was performed.  The seminal vesicles were somewhat difficult to visualize, but were unremarkable.  His prostate was markedly enlarged in a normal-appearing peripheral zone, but very, very large transitional zone.  His prostatic volume was 260.6 mL with a width of 6.35 cm, height of 8.10 cm, and a length of 9.6 cm.  Once the diagnostic scan had been performed, transrectal ultrasound- guided core biopsies were obtained using the 18-gauge spring-loaded needle.  The standard 12 core pattern of biopsies were obtained with biopsies from the lateral and medial left and right base, mid and apical prostate.  Two additional deep cores were obtained from the transitional zone, but the transitional zone in itself had a normal heterogeneous echotexture without suspicious lesions.  There was a cyst on the right base in the transitional zone.  Once the biopsies had been completed,  he did have some minor rectal bleeding.  A gauze sponge was applied to the prostate bed and pressure was held for 3 minutes.  The gauze was then left in place during the 2nd portion of the procedure.  At this point, the patient was prepped with Betadine solution and draped in usual sterile fashion and cystoscopy was performed using a 22-French scope with 12 and 70 degree lenses.  Examination revealed a normal urethra.  The external sphincter was intact.  The prostatic urethra was very elongated, approximately 7-8 cm in length with bilobar hyperplasia. There was evidence of his prior transurethral electrovaporization and he had a reasonably good prostatic TUR channel.  There were some benign- appearing polyps in the mid and proximal prostatic urethra as  well as some prominent vessels at the bladder neck.  Examination of bladder revealed mild-to-moderate trabeculation.  Ureteral orifices were unremarkable effluxing clear urine.  No stones were seen.  No tumors were seen.  I did have some difficulty getting the scope far enough into the bladder, but with the 70-degree lens in addition to the 12, I felt I had thoroughly inspected the bladder wall, I was just at the extent of the scope with his long prostate.  During the initial cystoscopy, he was noted to have some matured right clot without active bleeding, and that was evacuated from the bladder without difficulty.  No obvious point source of bleeding was noted within the bladder and it was felt that the prostatic urethra was the most likely source of blood.  Once the initial cystoscopic inspection was performed, the Bugbee electrode was used to fulgurate the prominent blood vessels and prostatic urethral polyps.  The bladder was drained and the scope was removed.  The drapes were then removed and the gauze was removed from the rectum with no further active bleeding.  He was taken down from lithotomy position.  His anesthetic was reversed and he was moved to recovery room in stable condition.  There were no complications.     Marshall Cork. Jeffie Pollock, M.D.     JJW/MEDQ  D:  10/16/2012  T:  10/16/2012  Job:  [id number]

## 2012-10-16 NOTE — Anesthesia Procedure Notes (Signed)
Procedure Name: LMA Insertion Date/Time: 10/16/2012 9:05 AM Performed by: Justice Rocher Pre-anesthesia Checklist: Patient identified, Emergency Drugs available, Suction available and Patient being monitored Patient Re-evaluated:Patient Re-evaluated prior to inductionOxygen Delivery Method: Circle System Utilized Preoxygenation: Pre-oxygenation with 100% oxygen Intubation Type: IV induction Ventilation: Mask ventilation without difficulty LMA: LMA with gastric port inserted LMA Size: 5.0 Number of attempts: 1 Airway Equipment and Method: bite block Placement Confirmation: positive ETCO2 Tube secured with: Tape Dental Injury: Teeth and Oropharynx as per pre-operative assessment

## 2012-10-16 NOTE — Anesthesia Preprocedure Evaluation (Addendum)
Anesthesia Evaluation  Patient identified by MRN, date of birth, ID band Patient awake    Reviewed: Allergy & Precautions, H&P , NPO status , Patient's Chart, lab work & pertinent test results  Airway Mallampati: II TM Distance: >3 FB Neck ROM: Full    Dental No notable dental hx.    Pulmonary neg pulmonary ROS,  breath sounds clear to auscultation  Pulmonary exam normal       Cardiovascular Exercise Tolerance: Good + CAD Rhythm:Regular Rate:Normal  Sees Dr. Frederico Hamman in [place]. Last visit 01/09/12. EF 48% at that time. No current cardiopulmonary symptoms.  S/P CABG 2006  ECG: NSR, septal q waves. CXR: cardiomegaly, lungs clear.   Neuro/Psych negative neurological ROS  negative psych ROS   GI/Hepatic negative GI ROS, Neg liver ROS,   Endo/Other  diabetes, Type 2, Oral Hypoglycemic Agents and Insulin Dependent  Renal/GU Renal diseaseKidney stones.  negative genitourinary   Musculoskeletal negative musculoskeletal ROS (+)   Abdominal (+) + obese,   Peds negative pediatric ROS (+)  Hematology negative hematology ROS (+)   Anesthesia Other Findings   Reproductive/Obstetrics negative OB ROS                         Anesthesia Physical Anesthesia Plan  ASA: III  Anesthesia Plan: General   Post-op Pain Management:    Induction: Intravenous  Airway Management Planned: LMA  Additional Equipment:   Intra-op Plan:   Post-operative Plan: Extubation in OR  Informed Consent: I have reviewed the patients History and Physical, chart, labs and discussed the procedure including the risks, benefits and alternatives for the proposed anesthesia with the patient or authorized representative who has indicated his/her understanding and acceptance.   Dental advisory given  Plan Discussed with: CRNA  Anesthesia Plan Comments:         Anesthesia Quick Evaluation

## 2012-10-16 NOTE — Brief Op Note (Signed)
10/16/2012  9:39 AM  PATIENT:  [name]  62 y.o. male  PRE-OPERATIVE DIAGNOSIS:  gross hematuria  POST-OPERATIVE DIAGNOSIS:  Elevated PSA and gross hematuria  PROCEDURE:  Procedure(s) (LRB) with comments: CYSTOSCOPY (N/A) - Cystoscopy with Clot Evacuation and fulguration bladder neck. PROSTATE BIOPSY (N/A) - Through ultrasound.  SURGEON:  Surgeon(s) and Role:    * Malka So, MD - Primary  PHYSICIAN ASSISTANT:   ASSISTANTS: none   ANESTHESIA:   general  EBL:  Total I/O In: 200 [I.V.:200] Out: -   BLOOD ADMINISTERED:none  DRAINS: none   LOCAL MEDICATIONS USED:  NONE  SPECIMEN:  Source of Specimen:  Prostate biopsy cores x 14  DISPOSITION OF SPECIMEN:  PATHOLOGY  COUNTS:  YES  TOURNIQUET:  * No tourniquets in log *  DICTATION: .Other Dictation: Dictation Number [id number]  PLAN OF CARE: Discharge to home after PACU  PATIENT DISPOSITION:  PACU - hemodynamically stable.   Delay start of Pharmacological VTE agent (>24hrs) due to surgical blood loss or risk of bleeding: not applicable

## 2012-10-16 NOTE — Interval H&P Note (Signed)
History and Physical Interval Note:  10/16/2012 7:36 AM  Russell Trevino  has presented today for surgery, with the diagnosis of gross hematuria  The various methods of treatment have been discussed with the patient and family. After consideration of risks, benefits and other options for treatment, the patient has consented to  Procedure(s) (LRB) with comments: CYSTOSCOPY (N/A) - Cystoscopy with Clot Evacuation as a surgical intervention .  The patient's history has been reviewed, patient examined, no change in status, stable for surgery.  I have reviewed the patient's chart and labs.  Questions were answered to the patient's satisfaction.     [name]  His PSA was 10 on preop labs.   His culture and cytology were negative.  He reports passing a clot and now has clear urine.    He is going to need a prostate [place] and biopsy and I will add that today.   I reviewed the risks of bleeding, infection and voiding difficulty.  He will be given a fleets enema and rocephin will be added to his preop antibiotics.

## 2012-10-17 ENCOUNTER — Encounter (HOSPITAL_BASED_OUTPATIENT_CLINIC_OR_DEPARTMENT_OTHER): Payer: Self-pay | Admitting: Urology

## 2013-03-25 DIAGNOSIS — I2510 Atherosclerotic heart disease of native coronary artery without angina pectoris: Secondary | ICD-10-CM | POA: Insufficient documentation

## 2013-03-25 DIAGNOSIS — I429 Cardiomyopathy, unspecified: Secondary | ICD-10-CM | POA: Insufficient documentation

## 2013-09-16 ENCOUNTER — Ambulatory Visit (HOSPITAL_COMMUNITY): Payer: BC Managed Care – PPO

## 2013-09-16 ENCOUNTER — Other Ambulatory Visit: Payer: Self-pay | Admitting: Urology

## 2013-09-16 ENCOUNTER — Encounter (HOSPITAL_COMMUNITY)
Admission: RE | Disposition: A | Discharge status: Home / Self Care | Payer: Self-pay | Source: Ambulatory Visit | Attending: Urology

## 2013-09-16 ENCOUNTER — Encounter (HOSPITAL_COMMUNITY): Payer: Self-pay | Admitting: General Practice

## 2013-09-16 ENCOUNTER — Ambulatory Visit (HOSPITAL_COMMUNITY)
Admission: RE | Admit: 2013-09-16 | Discharge: 2013-09-16 | Disposition: A | Discharge status: Home / Self Care | Payer: BC Managed Care – PPO | Source: Ambulatory Visit | Attending: Urology | Admitting: Urology

## 2013-09-16 DIAGNOSIS — Z794 Long term (current) use of insulin: Secondary | ICD-10-CM | POA: Insufficient documentation

## 2013-09-16 DIAGNOSIS — C61 Malignant neoplasm of prostate: Secondary | ICD-10-CM | POA: Insufficient documentation

## 2013-09-16 DIAGNOSIS — Z951 Presence of aortocoronary bypass graft: Secondary | ICD-10-CM | POA: Insufficient documentation

## 2013-09-16 DIAGNOSIS — Z79899 Other long term (current) drug therapy: Secondary | ICD-10-CM | POA: Insufficient documentation

## 2013-09-16 DIAGNOSIS — N201 Calculus of ureter: Secondary | ICD-10-CM | POA: Insufficient documentation

## 2013-09-16 DIAGNOSIS — I252 Old myocardial infarction: Secondary | ICD-10-CM | POA: Insufficient documentation

## 2013-09-16 DIAGNOSIS — I10 Essential (primary) hypertension: Secondary | ICD-10-CM | POA: Insufficient documentation

## 2013-09-16 DIAGNOSIS — E119 Type 2 diabetes mellitus without complications: Secondary | ICD-10-CM | POA: Insufficient documentation

## 2013-09-16 DIAGNOSIS — I2510 Atherosclerotic heart disease of native coronary artery without angina pectoris: Secondary | ICD-10-CM | POA: Insufficient documentation

## 2013-09-16 DIAGNOSIS — Z87891 Personal history of nicotine dependence: Secondary | ICD-10-CM | POA: Insufficient documentation

## 2013-09-16 LAB — GLUCOSE, CAPILLARY: Glucose-Capillary: 103 mg/dL — ABNORMAL HIGH (ref 70–99)

## 2013-09-16 SURGERY — LITHOTRIPSY, ESWL
Anesthesia: LOCAL | Laterality: Left

## 2013-09-16 MED ORDER — DIAZEPAM 5 MG PO TABS
10.0000 mg | ORAL_TABLET | ORAL | Status: AC
  Administered 2013-09-16: 10 mg via ORAL
  Filled 2013-09-16: qty 2

## 2013-09-16 MED ORDER — SODIUM CHLORIDE 0.9 % IV SOLN: 250.0000 mL | INTRAVENOUS | Status: DC | PRN

## 2013-09-16 MED ORDER — SODIUM CHLORIDE 0.9 % IJ SOLN: 3.0000 mL | INTRAMUSCULAR | Status: DC | PRN

## 2013-09-16 MED ORDER — DEXTROSE-NACL 5-0.45 % IV SOLN
INTRAVENOUS | Status: DC
  Administered 2013-09-16: 16:00:00 via INTRAVENOUS

## 2013-09-16 MED ORDER — CIPROFLOXACIN HCL 500 MG PO TABS
500.0000 mg | ORAL_TABLET | ORAL | Status: AC
Start: 2013-09-16 — End: 2013-09-16
  Administered 2013-09-16: 500 mg via ORAL
  Filled 2013-09-16: qty 1

## 2013-09-16 MED ORDER — SODIUM CHLORIDE 0.9 % IJ SOLN: 3.0000 mL | Freq: Two times a day (BID) | INTRAMUSCULAR | Status: DC

## 2013-09-16 MED ORDER — ACETAMINOPHEN 325 MG PO TABS: 650.0000 mg | ORAL_TABLET | ORAL | Status: DC | PRN

## 2013-09-16 MED ORDER — ACETAMINOPHEN 650 MG RE SUPP
650.0000 mg | RECTAL | Status: DC | PRN
  Filled 2013-09-16: qty 1

## 2013-09-16 MED ORDER — FENTANYL CITRATE 0.05 MG/ML IJ SOLN: 25.0000 ug | INTRAMUSCULAR | Status: DC | PRN

## 2013-09-16 MED ORDER — OXYCODONE-ACETAMINOPHEN 5-325 MG PO TABS: 1.0000 | ORAL_TABLET | ORAL | Status: DC | PRN

## 2013-09-16 MED ORDER — DIPHENHYDRAMINE HCL 25 MG PO CAPS
25.0000 mg | ORAL_CAPSULE | ORAL | Status: AC
  Administered 2013-09-16: 25 mg via ORAL
  Filled 2013-09-16: qty 1

## 2013-09-16 MED ORDER — ONDANSETRON HCL 4 MG/2ML IJ SOLN: 4.0000 mg | Freq: Four times a day (QID) | INTRAMUSCULAR | Status: DC | PRN

## 2013-09-16 MED ORDER — OXYCODONE HCL 5 MG PO TABS: 5.0000 mg | ORAL_TABLET | ORAL | Status: DC | PRN
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
## 2013-09-16 NOTE — Discharge Instructions (Signed)
Lithotripsy Care After Refer to this sheet for the next few weeks. These discharge instructions provide you with general information on caring for yourself after you leave the hospital. Your caregiver may also give you specific instructions. Your treatment has been planned according to the most current medical practices available, but unavoidable complications sometimes occur. If you have any problems or questions after discharge, please call your caregiver. AFTER THE PROCEDURE   The recovery time will vary with the procedure done.  You will be taken to the recovery area. A nurse will watch and check your progress. Once you are awake, stable, and taking fluids well, you will be allowed to go home as long as there are no problems.  Your urine may have a red tinge for a few days after treatment. Blood loss is usually minimal.  You may have soreness in the back or flank area. This usually goes away after a few days. The procedure can cause blotches or bruises on the back where the pressure wave enters the skin. These marks usually cause only minimal discomfort and should disappear in a short time.  Stone fragments should begin to pass within 24 hours of treatment. However, a delayed passage is not unusual.  You may have pain, discomfort, and feel sick to your stomach (nauseous) when the crushed (pulverized) fragments of stone are passed down the tube from the kidney to the bladder. Stone fragments can pass soon after the procedure and may last for up to 4 to 8 weeks.  A small number of patients may have severe pain when stone fragments are not able to pass, which leads to an obstruction.  If your stone is greater than 1 inch/2.5 centimeters in diameter or if you have multiple stones that have a combined diameter greater than 1 inch/2.5 centimeters, you may require more than 1 treatment.  You must have someone drive you home. HOME CARE INSTRUCTIONS   Rest at home until you feel your energy  improving.  Only take over-the-counter or prescription medicines for pain, discomfort, or fever as directed by your caregiver. Depending on the type of lithotripsy, you may need to take medicines (antibiotics) that kill germs and anti-inflammatory medicines for a few days.  Drink enough water and fluids to keep your urine clear or pale yellow. This helps "flush" your kidneys. It helps pass any remaining pieces of stone and prevents stones from coming back.  Most people can resume daily activities within 1 or 2 days after standard lithotripsy. It can take longer to recover from laser and percutaneous lithotripsy.  If the stones are in your urinary system, you may be asked to strain your urine at home to look for stones. Any stones that are found can be sent to a medical lab for examination.  Visit your caregiver for a follow-up appointment in a few weeks. Your doctor may remove your stent if you have one. Your caregiver will also check to see whether stone particles still remain. SEEK MEDICAL CARE IF:   Your pain is not relieved by medicine.  You have a lasting nauseous feeling.  You feel there is too much blood in the urine.  You develop persistent problems with frequent and/or painful urination that does not at least partially improve after 2 days following the procedure.  You have a congested cough.  You feel lightheaded.  You develop a rash or any other signs that might suggest an allergic problem.  You develop any reaction or side effects to your medicine(s).  SEEK IMMEDIATE MEDICAL CARE IF:   You experience severe back and/or flank pain.  You see nothing but blood when you urinate.  You cannot pass any urine at all.  You have a fever.  You develop shortness of breath, difficulty breathing, or chest pain.  You develop vomiting that will not stop after 6 to 8 hours.  You have a fainting episode. MAKE SURE YOU:   Understand these instructions.  Will watch your  condition.  Will get help right away if you are not doing well or get worse. Document Released: 09/18/2007 Document Revised: 11/21/2011 Document Reviewed: 03/14/2013 [facility] Patient Information 2014 [place], [place].

## 2013-11-26 ENCOUNTER — Other Ambulatory Visit: Payer: Self-pay | Admitting: Physical Medicine and Rehabilitation

## 2013-11-26 DIAGNOSIS — M545 Low back pain, unspecified: Secondary | ICD-10-CM

## 2013-12-05 ENCOUNTER — Ambulatory Visit
Admission: RE | Admit: 2013-12-05 | Discharge: 2013-12-05 | Disposition: A | Discharge status: Home / Self Care | Payer: BC Managed Care – PPO | Source: Ambulatory Visit | Attending: Physical Medicine and Rehabilitation | Admitting: Physical Medicine and Rehabilitation

## 2013-12-05 DIAGNOSIS — M545 Low back pain, unspecified: Secondary | ICD-10-CM

## 2013-12-05 MED ORDER — GADOBENATE DIMEGLUMINE 529 MG/ML IV SOLN
20.0000 mL | Freq: Once | INTRAVENOUS | Status: AC | PRN
  Administered 2013-12-05: 20 mL via INTRAVENOUS

## 2014-03-03 ENCOUNTER — Ambulatory Visit (HOSPITAL_BASED_OUTPATIENT_CLINIC_OR_DEPARTMENT_OTHER)
Admission: RE | Admit: 2014-03-03 | Discharge: 2014-03-03 | Disposition: A | Discharge status: Home / Self Care | Payer: BC Managed Care – PPO | Source: Ambulatory Visit | Attending: Physician Assistant | Admitting: Physician Assistant

## 2014-03-03 ENCOUNTER — Other Ambulatory Visit (HOSPITAL_BASED_OUTPATIENT_CLINIC_OR_DEPARTMENT_OTHER): Payer: Self-pay | Admitting: Physician Assistant

## 2014-03-03 DIAGNOSIS — H539 Unspecified visual disturbance: Secondary | ICD-10-CM | POA: Insufficient documentation

## 2014-03-03 DIAGNOSIS — I6789 Other cerebrovascular disease: Secondary | ICD-10-CM | POA: Insufficient documentation

## 2016-05-26 ENCOUNTER — Ambulatory Visit (INDEPENDENT_AMBULATORY_CARE_PROVIDER_SITE_OTHER): Payer: Medicare Other | Admitting: Internal Medicine

## 2016-05-26 ENCOUNTER — Other Ambulatory Visit (INDEPENDENT_AMBULATORY_CARE_PROVIDER_SITE_OTHER): Payer: Medicare Other

## 2016-05-26 ENCOUNTER — Encounter: Payer: Self-pay | Admitting: Internal Medicine

## 2016-05-26 VITALS — BP 198/82 | HR 64 | Temp 98.1°F | Resp 16 | Wt 246.0 lb

## 2016-05-26 DIAGNOSIS — I2510 Atherosclerotic heart disease of native coronary artery without angina pectoris: Secondary | ICD-10-CM

## 2016-05-26 DIAGNOSIS — Z794 Long term (current) use of insulin: Secondary | ICD-10-CM

## 2016-05-26 DIAGNOSIS — E1169 Type 2 diabetes mellitus with other specified complication: Secondary | ICD-10-CM | POA: Insufficient documentation

## 2016-05-26 DIAGNOSIS — Z951 Presence of aortocoronary bypass graft: Secondary | ICD-10-CM | POA: Diagnosis not present

## 2016-05-26 DIAGNOSIS — I10 Essential (primary) hypertension: Secondary | ICD-10-CM | POA: Insufficient documentation

## 2016-05-26 DIAGNOSIS — E1142 Type 2 diabetes mellitus with diabetic polyneuropathy: Secondary | ICD-10-CM

## 2016-05-26 DIAGNOSIS — E782 Mixed hyperlipidemia: Secondary | ICD-10-CM | POA: Insufficient documentation

## 2016-05-26 DIAGNOSIS — Z114 Encounter for screening for human immunodeficiency virus [HIV]: Secondary | ICD-10-CM

## 2016-05-26 DIAGNOSIS — C61 Malignant neoplasm of prostate: Secondary | ICD-10-CM

## 2016-05-26 DIAGNOSIS — Z1159 Encounter for screening for other viral diseases: Secondary | ICD-10-CM

## 2016-05-26 DIAGNOSIS — E785 Hyperlipidemia, unspecified: Secondary | ICD-10-CM

## 2016-05-26 DIAGNOSIS — I152 Hypertension secondary to endocrine disorders: Secondary | ICD-10-CM | POA: Insufficient documentation

## 2016-05-26 LAB — LIPID PANEL
CHOLESTEROL: 249 mg/dL — AB (ref 0–200)
HDL: 52.1 mg/dL (ref >39.00)
LDL Cholesterol: 159 mg/dL — ABNORMAL HIGH (ref 0–99)
NonHDL: 197.3
TRIGLYCERIDES: 190 mg/dL — AB (ref 0.0–149.0)
Total CHOL/HDL Ratio: 5
VLDL: 38 mg/dL (ref 0.0–40.0)

## 2016-05-26 LAB — TSH: TSH: 2.63 u[IU]/mL (ref 0.35–4.50)

## 2016-05-26 LAB — CBC WITH DIFFERENTIAL/PLATELET
BASOS ABS: 0 10*3/uL (ref 0.0–0.1)
BASOS PCT: 0.5 % (ref 0.0–3.0)
EOS ABS: 0.2 10*3/uL (ref 0.0–0.7)
Eosinophils Relative: 3.1 % (ref 0.0–5.0)
HCT: 43.7 % (ref 39.0–52.0)
HEMOGLOBIN: 15.1 g/dL (ref 13.0–17.0)
Lymphocytes Relative: 15.9 % (ref 12.0–46.0)
Lymphs Abs: 1.1 10*3/uL (ref 0.7–4.0)
MCHC: 34.6 g/dL (ref 30.0–36.0)
MCV: 89.7 fl (ref 78.0–100.0)
MONO ABS: 0.5 10*3/uL (ref 0.1–1.0)
Monocytes Relative: 7.3 % (ref 3.0–12.0)
NEUTROS ABS: 5 10*3/uL (ref 1.4–7.7)
Neutrophils Relative %: 73.2 % (ref 43.0–77.0)
PLATELETS: 171 10*3/uL (ref 150.0–400.0)
RBC: 4.87 Mil/uL (ref 4.22–5.81)
RDW: 14.3 % (ref 11.5–15.5)
WBC: 6.9 10*3/uL (ref 4.0–10.5)

## 2016-05-26 LAB — COMPREHENSIVE METABOLIC PANEL
ALBUMIN: 4.1 g/dL (ref 3.5–5.2)
ALT: 18 U/L (ref 0–53)
AST: 11 U/L (ref 0–37)
Alkaline Phosphatase: 87 U/L (ref 39–117)
BILIRUBIN TOTAL: 1.6 mg/dL — AB (ref 0.2–1.2)
BUN: 18 mg/dL (ref 6–23)
CALCIUM: 9.5 mg/dL (ref 8.4–10.5)
CHLORIDE: 101 meq/L (ref 96–112)
CO2: 30 mEq/L (ref 19–32)
CREATININE: 1.01 mg/dL (ref 0.40–1.50)
GFR: 78.57 mL/min (ref >60.00)
Glucose, Bld: 360 mg/dL — ABNORMAL HIGH (ref 70–99)
Potassium: 5.3 mEq/L — ABNORMAL HIGH (ref 3.5–5.1)
SODIUM: 137 meq/L (ref 135–145)
Total Protein: 7.1 g/dL (ref 6.0–8.3)

## 2016-05-26 LAB — MICROALBUMIN / CREATININE URINE RATIO
CREATININE, U: 99.3 mg/dL
MICROALB UR: 266.9 mg/dL — AB (ref 0.0–1.9)
MICROALB/CREAT RATIO: 268.8 mg/g — AB (ref 0.0–30.0)

## 2016-05-26 LAB — PSA: PSA: 4.43 ng/mL — ABNORMAL HIGH (ref 0.10–4.00)

## 2016-05-26 LAB — HEMOGLOBIN A1C: HEMOGLOBIN A1C: 10 % — AB (ref 4.6–6.5)

## 2016-05-26 MED ORDER — METFORMIN HCL 1000 MG PO TABS: 1000.0000 mg | ORAL_TABLET | Freq: Two times a day (BID) | ORAL | 1 refills | Status: DC

## 2016-05-26 MED ORDER — INSULIN NPH ISOPHANE & REGULAR (70-30) 100 UNIT/ML ~~LOC~~ SUSP: SUBCUTANEOUS | 11 refills | Status: DC

## 2016-05-26 MED ORDER — LISINOPRIL 30 MG PO TABS: 30.0000 mg | ORAL_TABLET | Freq: Every day | ORAL | 3 refills | Status: DC

## 2016-05-26 MED ORDER — FINASTERIDE 5 MG PO TABS: 5.0000 mg | ORAL_TABLET | Freq: Every day | ORAL | 3 refills | Status: DC

## 2016-05-26 MED ORDER — ATORVASTATIN CALCIUM 80 MG PO TABS: 80.0000 mg | ORAL_TABLET | Freq: Every day | ORAL | 3 refills | Status: DC

## 2016-05-26 NOTE — Assessment & Plan Note (Signed)
He states his blood pressure is often high in the doctor's office He feels his blood pressure is typically much lower than is here today and believes it is controlled usually This is his first visit here so it will not change his medication Advised him to start monitoring his blood pressure he can Stressed good control of his blood pressure Continue current medications

## 2016-05-26 NOTE — Assessment & Plan Note (Addendum)
Continue statin Check blood work

## 2016-05-26 NOTE — Patient Instructions (Signed)
  Test(s) ordered today. Your results will be released to [place] (or called to you) after review, usually within 72hours after test completion. If any changes need to be made, you will be notified at that same time.  All other Health Maintenance issues reviewed.   All recommended immunizations and age-appropriate screenings are up-to-date or discussed.  No immunizations administered today.   Medications reviewed and updated.  No changes recommended at this time.  Your prescription(s) have been submitted to your pharmacy. Please take as directed and contact our office if you believe you are having problem(s) with the medication(s).  Please followup in 3 months, sooner if needed

## 2016-05-26 NOTE — Progress Notes (Signed)
Pre visit review using our clinic review tool, if applicable. No additional management support is needed unless otherwise documented below in the visit note. 

## 2016-05-26 NOTE — Assessment & Plan Note (Signed)
Following with urology and has an appointment scheduled He requested PSA to be checked today-ordered

## 2016-05-26 NOTE — Assessment & Plan Note (Addendum)
He has a history of MI and CABG He is experiencing some chest tightness Recommended that he call his cardiologist and set up a sooner appointment for further evaluation of his chest tightness Continue aspirin, Lipitor, current blood pressure medications

## 2016-05-26 NOTE — Assessment & Plan Note (Signed)
Not well controlled, but not taking his medication appropriately Stressed taking the metformin and insulin twice daily every day Stressed regular exercise and weight loss Check A1c today with microalbumin Follow-up in 3 months and recheck A1c

## 2016-05-26 NOTE — Progress Notes (Signed)
Subjective:    Patient ID: Russell Trevino, male    DOB: [birth year], 65 y.o.   MRN: [id number]  HPI He is here to establish with a new pcp.     Diabetes: He is taking his medication daily, but often forgets his metformin in the evening.  He takes 50 units of his insulin before dinner. He sometimes takes his insulin twice a day.  He is not always compliant with a diabetic diet. He is exercising regularly. He monitors his sugars and they have been running 176 this morning without insulin yesterday or today.  His sugars can go up to the 270's on occasion. He has had one episode of hypoglycemia in the past couple of months - 60. He checks his feet daily and denies foot lesions.  He does have some tingling in his toes. He is up-to-date with an ophthalmology examination.   CAD, h/o CABG:  He is following with cardiology. He is taking his aspirin daily along with his Lipitor and carvedilol. He does experience some chest tightness at times. He last saw his cardiologist about 6 months ago. He had a suboptimal stress test approximately one year ago-he was told it looked okay, but his cardiologist would prefer him to be on the treadmill walker and he was. He is not currently exercising regularly. He does not eat a very healthy diet.  Hypertension: He is taking his medication daily. He is somewhat compliant with a low sodium diet.  He denies palpitations regular headaches. He is not exercising regularly.  He does not monitor his blood pressure at home.    Hyperlipidemia: He is taking his medication daily. He is somewhat compliant with a low fat/cholesterol diet. He is not exercising regularly. He denies myalgias.    Medications and allergies reviewed with patient and updated if appropriate.  Patient Active Problem List   Diagnosis Date Noted  . Hx of CABG 05/26/2016  . Prostate cancer ([place]) 05/26/2016  . Essential hypertension, benign 05/26/2016  . Hyperlipidemia 05/26/2016  . Cardiomyopathy ([place])  03/25/2013  . Chronic coronary artery disease 03/25/2013  . Diabetes mellitus, type II ([place]) 01/09/2012  . Left ventricular systolic dysfunction 16/38/4665  . MI (myocardial infarction) ([place]) 01/09/2012    No current outpatient prescriptions on file prior to visit.   No current facility-administered medications on file prior to visit.     Past Medical History:  Diagnosis Date  . BPH (benign prostatic hypertrophy)   . Cardiomyopathy, ischemic   . Coronary artery disease CARDIOLOGIST- DR Frederico Hamman  ([facility])--  LAST VISIT APRIL 2013--  REQUEST FOR RECORD FAXED   LAST CARDIAC CATH 2009 (GRAFTS PATENT)/ LAST STRESS TEST 2010-  PER PT OK  . Diabetes mellitus, type 2   . Frequency of urination   . Gross hematuria   . History of CHF (congestive heart failure) 2007  . History of kidney stones   . History of myocardial infarction 2006 --  NON ST ELEVATED  MI  S/P CABG  , [facility]  . History of urinary retention   . Hyperlipidemia   . Nocturia   . Renal calculi BILATERAL PER CT SCAN--  NON-OBSTRUCTIVE  . S/P CABG x 4 2006  . Urgency of urination     Past Surgical History:  Procedure Laterality Date  . CARDIAC CATHETERIZATION  01-17-2008  DR SANTOS ([facility])   GRAFTS PATENT / EF 40%  . CORONARY ARTERY BYPASS GRAFT  AUG 2006   X4  VESSEL  . CYSTO /  RIGHT URETERAL STENT PLACEMENT  10-08-1999  . CYSTO/  REMOVAL BLADDER STONES  01-14-2010  . CYSTOSCOPY  10/16/2012   Procedure: CYSTOSCOPY;  Surgeon: Malka So, MD;  Location: [facility];  Service: Urology;  Laterality: N/A;  Cystoscopy with Clot Evacuation and fulguration bladder neck.  Marland Kitchen EXTRACORPOREAL SHOCK WAVE LITHOTRIPSY  10-15-2009   RIGHT  . KNEE ARTHROSCOPY  1978   RIGHT  . LEFT SHOULDER SURGERY   2002  . LUMBAR FUSION  09-10-2008   L4 - L5  . PERCUTANEOUS NEPHROSTOLITHOTOMY  1979  . PROSTATE BIOPSY  10/16/2012   Procedure: PROSTATE BIOPSY;  Surgeon: Malka So, MD;  Location: [facility];   Service: Urology;  Laterality: N/A;  Through ultrasound.  Marland Kitchen SHOULDER ARTHROSCOPY W/ SUBACROMIAL DECOMPRESSION AND DISTAL CLAVICLE EXCISION  10-11-2004   AND PARTIAL ROTATOR CUFF REPAIR  . TRANSTHORACIC ECHOCARDIOGRAM  01-09-2012  [facility]   LV SYSTOLIC FUNCTION MILDLY REDUCED/ EF 48%/ LV FILLING PATTERN  IS IMPAIRED/ HYPOKINESIS IN THE MID AND BASALAR SEPTUM & ANTEROSEPTUM  . TURP VAPORIZATION  01-19-2010   BPH W/ BOO  . URETEROLITHOTOMY  1976    Social History   Social History  . Marital status: Married    Spouse name: N/A  . Number of children: N/A  . Years of education: N/A   Social History Main Topics  . Smoking status: Never Smoker  . Smokeless tobacco: Never Used  . Alcohol use No  . Drug use: No  . Sexual activity: Not on file   Other Topics Concern  . Not on file   Social History Narrative  . No narrative on file    No family history on file.  Review of Systems  Constitutional: Negative for chills and fever.  HENT: Positive for congestion (in morning only) and sneezing (morning only).   Eyes: Negative for visual disturbance.  Respiratory: Positive for cough, shortness of breath (sometimes) and wheezing (sometimes when sleeping).   Cardiovascular: Positive for chest pain (tightness with and without exercise) and leg swelling (leg ankle edema). Negative for palpitations.  Gastrointestinal: Negative for abdominal pain and nausea.       Rare gerd  Endocrine: Negative for polydipsia and polyuria.  Musculoskeletal: Negative for arthralgias.  Neurological: Positive for numbness (tingling in toes). Negative for dizziness, light-headedness and headaches.  Psychiatric/Behavioral: Negative for dysphoric mood. The patient is not nervous/anxious.        Objective:   Vitals:   05/26/16 0904  BP: (!) 198/82  Pulse: 64  Resp: 16  Temp: 98.1 F (36.7 C)   Filed Weights   05/26/16 0904  Weight: 246 lb (111.6 kg)   Body mass index is 33.36 kg/m.   Physical  Exam Constitutional: He appears well-developed and well-nourished. No distress.  HENT:  Head: Normocephalic and atraumatic.  Right Ear: External ear normal.  Left Ear: External ear normal.  Mouth/Throat: Oropharynx is clear and moist.  Normal ear canals and TM b/l  Eyes: Conjunctivae normal.  Neck: Neck supple. No tracheal deviation present. No thyromegaly present.  No carotid bruit  Cardiovascular: Normal rate, regular rhythm, normal heart sounds and intact distal pulses.   No murmur heard. Pulmonary/Chest: Effort normal and breath sounds normal. No respiratory distress. He has no wheezes. He has no rales.  Abdominal: Soft. Bowel sounds are normal. He exhibits no distension. There is no tenderness.  Musculoskeletal: He exhibits no edema.  Lymphadenopathy:    He has no cervical adenopathy.  Skin: Skin is  warm and dry. He is not diaphoretic.  Psychiatric: He has a normal mood and affect. His behavior is normal.         Assessment & Plan:   See Problem List for Assessment and Plan of chronic medical problems.

## 2016-05-27 LAB — HIV ANTIBODY (ROUTINE TESTING W REFLEX): HIV 1&2 Ab, 4th Generation: NONREACTIVE

## 2016-05-27 LAB — HEPATITIS C ANTIBODY: HCV AB: NEGATIVE

## 2016-05-30 ENCOUNTER — Telehealth: Payer: Self-pay | Admitting: Internal Medicine

## 2016-05-30 MED ORDER — SILDENAFIL CITRATE 20 MG PO TABS
20.0000 mg | ORAL_TABLET | Freq: Three times a day (TID) | ORAL | 2 refills | Status: DC
Start: 2016-05-30 — End: 2016-07-04

## 2016-05-30 MED ORDER — SILDENAFIL CITRATE 20 MG PO TABS: 20.0000 mg | ORAL_TABLET | Freq: Three times a day (TID) | ORAL | 2 refills | Status: DC

## 2016-05-30 NOTE — Telephone Encounter (Signed)
Patient returned phone call on labs.  Please follow back up.

## 2016-05-30 NOTE — Telephone Encounter (Signed)
Patient is also requesting refill on sildenasil 20mg  take as needed to be sent to [facility].

## 2016-05-30 NOTE — Telephone Encounter (Signed)
RX sent to pharmacy pt requested.

## 2016-05-30 NOTE — Telephone Encounter (Signed)
Please advise on refill on Sildenafil. Spoke with pt, states his last pcp was filling this.

## 2016-05-30 NOTE — Telephone Encounter (Signed)
Ok to fill 

## 2016-06-21 ENCOUNTER — Encounter: Payer: Self-pay | Admitting: Internal Medicine

## 2016-06-21 ENCOUNTER — Ambulatory Visit (INDEPENDENT_AMBULATORY_CARE_PROVIDER_SITE_OTHER): Payer: Medicare Other | Admitting: Internal Medicine

## 2016-06-21 VITALS — BP 180/90 | HR 61 | Temp 98.3°F | Resp 12 | Ht 72.0 in | Wt 256.0 lb

## 2016-06-21 DIAGNOSIS — M546 Pain in thoracic spine: Secondary | ICD-10-CM | POA: Diagnosis not present

## 2016-06-21 DIAGNOSIS — Z23 Encounter for immunization: Secondary | ICD-10-CM

## 2016-06-21 MED ORDER — CYCLOBENZAPRINE HCL 5 MG PO TABS: 5.0000 mg | ORAL_TABLET | Freq: Three times a day (TID) | ORAL | 0 refills | Status: DC | PRN

## 2016-06-21 MED ORDER — MELOXICAM 15 MG PO TABS: 15.0000 mg | ORAL_TABLET | Freq: Every day | ORAL | 0 refills | Status: DC

## 2016-06-21 NOTE — Patient Instructions (Signed)
We have sent in meloxicam for the anti-inflammatory. Take 1 pill daily (with food) for the next 1-2 weeks to help heal the muscles.   We have also sent in flexeril which is a muscle relaxer that you can use for pain up to 3 times per day.

## 2016-06-21 NOTE — Assessment & Plan Note (Signed)
Muscle sprain, rx for mobic and flexeril. Encouraged not to take medications not prescribed for pain during the episode.

## 2016-06-21 NOTE — Progress Notes (Signed)
   Subjective:    Patient ID: Russell Trevino, male    DOB: 03-11-1951, 65 y.o.   MRN: [id number]  HPI The patient is a 65 YO man coming in for back pain for about 4 days. Was doing some yard work for multiple days. He took some Oxycodone left over from a prior surgery and is out of those. They did help with the pain. He is now taking aspirin several times per day. This helps mildly but he is still having pain. In the mid back and radiates in the back but not in the arms or legs. No bowel or bladder changes. No falls. No numbness. Overall is stable since onset.    Review of Systems  Constitutional: Positive for activity change. Negative for appetite change, chills, fatigue, fever and unexpected weight change.  Respiratory: Negative.   Cardiovascular: Negative.   Gastrointestinal: Negative.   Musculoskeletal: Positive for arthralgias, back pain and myalgias. Negative for gait problem, joint swelling, neck pain and neck stiffness.  Skin: Negative.   Neurological: Negative.       Objective:   Physical Exam  Constitutional: He is oriented to person, place, and time. He appears well-developed and well-nourished.  HENT:  Head: Normocephalic and atraumatic.  Eyes: EOM are normal.  Neck: Normal range of motion.  Cardiovascular: Normal rate and regular rhythm.   Pulmonary/Chest: Effort normal and breath sounds normal. No respiratory distress. He has no wheezes.  Abdominal: Soft.  Musculoskeletal: He exhibits tenderness.  Pain in the thoracic paraspinal region right, not left, no radiation.   Neurological: He is alert and oriented to person, place, and time.  Skin: Skin is warm and dry.   Vitals:   06/21/16 0822  BP: (!) 180/90  Pulse: 61  Resp: 12  Temp: 98.3 F (36.8 C)  TempSrc: Oral  SpO2: 98%  Weight: 256 lb (116.1 kg)  Height: 6' (1.829 m)      Assessment & Plan:  Flu shot given at visit.

## 2016-06-21 NOTE — Progress Notes (Signed)
Pre visit review using our clinic review tool, if applicable. No additional management support is needed unless otherwise documented below in the visit note. 

## 2016-06-23 ENCOUNTER — Telehealth: Payer: Self-pay | Admitting: Internal Medicine

## 2016-06-23 NOTE — Telephone Encounter (Signed)
Rec'd from [place] forward 13 pages to Dr. Quay Burow

## 2016-07-01 ENCOUNTER — Telehealth: Payer: Self-pay | Admitting: *Deleted

## 2016-07-01 NOTE — Telephone Encounter (Signed)
Rec'd call pt states he is needing refill on the Sildenafil. MD only gave him #10 and he use to get 90 pills before. He states he has to take up to 5 pills at a time for med to work. Is it ok to change quantity to # 90...Russell Trevino

## 2016-07-01 NOTE — Telephone Encounter (Signed)
Yes, ok to change.  thanks

## 2016-07-04 MED ORDER — SILDENAFIL CITRATE 20 MG PO TABS: 20.0000 mg | ORAL_TABLET | Freq: Three times a day (TID) | ORAL | 2 refills | Status: DC

## 2016-07-04 NOTE — Telephone Encounter (Signed)
Called pt no answer LMOM md ok quantity change rx sent to [facility]...Russell Trevino

## 2016-07-07 ENCOUNTER — Telehealth: Payer: Self-pay | Admitting: Internal Medicine

## 2016-07-07 NOTE — Telephone Encounter (Signed)
Records received form [facility] sent interoffice to [facility] on 07/07/16. CN

## 2016-07-11 ENCOUNTER — Telehealth: Payer: Self-pay | Admitting: Internal Medicine

## 2016-07-11 NOTE — Telephone Encounter (Signed)
Rec'd from [facility], [facility], [facility] forward 25 pages to Dr.Burns

## 2016-08-17 ENCOUNTER — Ambulatory Visit (INDEPENDENT_AMBULATORY_CARE_PROVIDER_SITE_OTHER): Payer: Medicare Other | Admitting: Internal Medicine

## 2016-08-17 ENCOUNTER — Ambulatory Visit (INDEPENDENT_AMBULATORY_CARE_PROVIDER_SITE_OTHER)
Admission: RE | Admit: 2016-08-17 | Discharge: 2016-08-17 | Disposition: A | Discharge status: Home / Self Care | Payer: Medicare Other | Source: Ambulatory Visit | Attending: Internal Medicine | Admitting: Internal Medicine

## 2016-08-17 ENCOUNTER — Encounter: Payer: Self-pay | Admitting: Internal Medicine

## 2016-08-17 VITALS — BP 152/70 | HR 75 | Temp 100.8°F | Resp 16 | Wt 255.0 lb

## 2016-08-17 DIAGNOSIS — R05 Cough: Secondary | ICD-10-CM | POA: Diagnosis not present

## 2016-08-17 DIAGNOSIS — R509 Fever, unspecified: Secondary | ICD-10-CM | POA: Diagnosis not present

## 2016-08-17 DIAGNOSIS — R6889 Other general symptoms and signs: Secondary | ICD-10-CM | POA: Insufficient documentation

## 2016-08-17 DIAGNOSIS — E1142 Type 2 diabetes mellitus with diabetic polyneuropathy: Secondary | ICD-10-CM

## 2016-08-17 DIAGNOSIS — R059 Cough, unspecified: Secondary | ICD-10-CM

## 2016-08-17 DIAGNOSIS — Z794 Long term (current) use of insulin: Secondary | ICD-10-CM

## 2016-08-17 LAB — POCT INFLUENZA A/B
Influenza A, POC: NEGATIVE
Influenza B, POC: NEGATIVE

## 2016-08-17 MED ORDER — HYDROCODONE-HOMATROPINE 5-1.5 MG/5ML PO SYRP: 5.0000 mL | ORAL_SOLUTION | Freq: Three times a day (TID) | ORAL | 0 refills | Status: DC | PRN

## 2016-08-17 MED ORDER — DOXYCYCLINE HYCLATE 100 MG PO TABS: 100.0000 mg | ORAL_TABLET | Freq: Two times a day (BID) | ORAL | 0 refills | Status: DC

## 2016-08-17 NOTE — Patient Instructions (Addendum)
A chest was ordered.  Test(s) ordered today. Your results will be released to [place] (or called to you) after review, usually within 72hours after test completion. If any changes need to be made, you will be notified at that same time.   Medications reviewed and updated.  Changes include a cough syrup - Hycodan. This may make you drowsy.  An antibiotic was sent to your pharmacy -doxycyline.   Your prescription(s) have been submitted to your pharmacy. Please take as directed and contact our office if you believe you are having problem(s) with the medication(s).   Please followup if your symptoms worsen or do not improve

## 2016-08-17 NOTE — Progress Notes (Signed)
Subjective:    Patient ID: Russell Trevino, male    DOB: Dec 06, 1950, 65 y.o.   MRN: [id number]  HPI He is here for an acute visit.   Symptoms started a couple of days ago.  He has a cough and his chest hurts from coughing.  He is bringing up some phlegm.  He denies any shortness of breath or wheeze. He states fatigue, fevers up to 102, ear pain, runny nose, sore throat, diarrhea, nausea, headaches, lightheadedness/dizziness. He has been taking Excedrin, Tylenol and cough syrup that helps some.  He denies significant nasal congestion, sinus pain, abdominal pain and body aches.   Medications and allergies reviewed with patient and updated if appropriate.  Patient Active Problem List   Diagnosis Date Noted  . Thoracic back pain 06/21/2016  . Hx of CABG 05/26/2016  . Prostate cancer ([place]) 05/26/2016  . Essential hypertension, benign 05/26/2016  . Hyperlipidemia 05/26/2016  . Cardiomyopathy ([place]) 03/25/2013  . Chronic coronary artery disease 03/25/2013  . Diabetes mellitus, type II ([place]) 01/09/2012  . Left ventricular systolic dysfunction 03/11/1600  . MI (myocardial infarction) 01/09/2012    Current Outpatient Prescriptions on File Prior to Visit  Medication Sig Dispense Refill  . aspirin 81 MG tablet Take 81 mg by mouth daily.    Marland Kitchen atorvastatin (LIPITOR) 80 MG tablet Take 1 tablet (80 mg total) by mouth daily. 90 tablet 3  . carvedilol (COREG) 6.25 MG tablet Take 6.25 mg by mouth daily.    . cyclobenzaprine (FLEXERIL) 5 MG tablet Take 1 tablet (5 mg total) by mouth 3 (three) times daily as needed for muscle spasms. 60 tablet 0  . finasteride (PROSCAR) 5 MG tablet Take 1 tablet (5 mg total) by mouth daily. 90 tablet 3  . insulin NPH-regular Human (HUMULIN 70/30) (70-30) 100 UNIT/ML injection Take 50 units in AM, 45 units in PM 10 mL 11  . lisinopril (PRINIVIL,ZESTRIL) 30 MG tablet Take 1 tablet (30 mg total) by mouth daily. 90 tablet 3  . meloxicam (MOBIC) 15 MG tablet Take 1  tablet (15 mg total) by mouth daily. 30 tablet 0  . metFORMIN (GLUCOPHAGE) 1000 MG tablet Take 1 tablet (1,000 mg total) by mouth 2 (two) times daily with a meal. 180 tablet 1  . sildenafil (REVATIO) 20 MG tablet Take 1 tablet (20 mg total) by mouth 3 (three) times daily. 90 tablet 2   No current facility-administered medications on file prior to visit.     Past Medical History:  Diagnosis Date  . BPH (benign prostatic hypertrophy)   . Cardiomyopathy, ischemic   . Coronary artery disease CARDIOLOGIST- DR Frederico Hamman  ([facility])--  LAST VISIT APRIL 2013--  REQUEST FOR RECORD FAXED   LAST CARDIAC CATH 2009 (GRAFTS PATENT)/ LAST STRESS TEST 2010-  PER PT OK  . Diabetes mellitus, type 2 ([place])   . Frequency of urination   . Gross hematuria   . History of CHF (congestive heart failure) 2007  . History of kidney stones   . History of myocardial infarction 2006 --  NON ST ELEVATED  MI  S/P CABG  , [facility]  . History of urinary retention   . Hyperlipidemia   . Nocturia   . Renal calculi BILATERAL PER CT SCAN--  NON-OBSTRUCTIVE  . S/P CABG x 4 2006  . Urgency of urination     Past Surgical History:  Procedure Laterality Date  . CARDIAC CATHETERIZATION  01-17-2008  DR SANTOS ([facility])   GRAFTS PATENT / EF  40%  . CORONARY ARTERY BYPASS GRAFT  AUG 2006   X4  VESSEL  . CYSTO / RIGHT URETERAL STENT PLACEMENT  10-08-1999  . CYSTO/  REMOVAL BLADDER STONES  01-14-2010  . CYSTOSCOPY  10/16/2012   Procedure: CYSTOSCOPY;  Surgeon: Malka So, MD;  Location: [facility];  Service: Urology;  Laterality: N/A;  Cystoscopy with Clot Evacuation and fulguration bladder neck.  Marland Kitchen EXTRACORPOREAL SHOCK WAVE LITHOTRIPSY  10-15-2009   RIGHT  . KNEE ARTHROSCOPY  1978   RIGHT  . LEFT SHOULDER SURGERY   2002  . LUMBAR FUSION  09-10-2008   L4 - L5  . PERCUTANEOUS NEPHROSTOLITHOTOMY  1979  . PROSTATE BIOPSY  10/16/2012   Procedure: PROSTATE BIOPSY;  Surgeon: Malka So, MD;  Location: [facility];  Service: Urology;  Laterality: N/A;  Through ultrasound.  Marland Kitchen SHOULDER ARTHROSCOPY W/ SUBACROMIAL DECOMPRESSION AND DISTAL CLAVICLE EXCISION  10-11-2004   AND PARTIAL ROTATOR CUFF REPAIR  . TRANSTHORACIC ECHOCARDIOGRAM  01-09-2012  [facility]   LV SYSTOLIC FUNCTION MILDLY REDUCED/ EF 48%/ LV FILLING PATTERN  IS IMPAIRED/ HYPOKINESIS IN THE MID AND BASALAR SEPTUM & ANTEROSEPTUM  . TURP VAPORIZATION  01-19-2010   BPH W/ BOO  . URETEROLITHOTOMY  1976    Social History   Social History  . Marital status: Married    Spouse name: N/A  . Number of children: N/A  . Years of education: N/A   Social History Main Topics  . Smoking status: Never Smoker  . Smokeless tobacco: Never Used  . Alcohol use No  . Drug use: No  . Sexual activity: Not on file   Other Topics Concern  . Not on file   Social History Narrative  . No narrative on file    No family history on file.  Review of Systems  Constitutional: Positive for fatigue and fever (102).  HENT: Positive for ear pain, rhinorrhea and sore throat. Negative for congestion, sinus pain and sinus pressure.   Respiratory: Positive for cough. Negative for shortness of breath and wheezing.   Cardiovascular: Positive for chest pain (from coughing).  Gastrointestinal: Positive for diarrhea and nausea. Negative for abdominal pain.  Musculoskeletal: Negative for myalgias.  Neurological: Positive for dizziness, light-headedness and headaches.       Objective:   Vitals:   08/17/16 1618  BP: (!) 152/70  Pulse: 75  Resp: 16  Temp: (!) 100.8 F (38.2 C)   Filed Weights   08/17/16 1618  Weight: 255 lb (115.7 kg)   Body mass index is 34.58 kg/m.   Physical Exam GENERAL APPEARANCE: Appears stated age, moderately ill appearing, NAD EYES: conjunctiva clear, no icterus HEENT: bilateral tympanic membranes and ear canals normal, oropharynx with mild erythema, no thyromegaly, trachea midline, no cervical or supraclavicular  lymphadenopathy LUNGS: Clear to auscultation without wheeze or crackles, unlabored breathing, good air entry bilaterally HEART: Normal S1,S2 without murmurs EXTREMITIES: Without clubbing, cyanosis, or edema        Assessment & Plan:   See Problem List for Assessment and Plan of chronic medical problems.

## 2016-08-17 NOTE — Progress Notes (Signed)
Pre visit review using our clinic review tool, if applicable. No additional management support is needed unless otherwise documented below in the visit note. 

## 2016-08-17 NOTE — Assessment & Plan Note (Signed)
As above.

## 2016-08-17 NOTE — Assessment & Plan Note (Signed)
He is eating less since being sick and depending on how much he is eating he may need to decrease his insulin Monitor sugars closely Call with questions

## 2016-08-17 NOTE — Assessment & Plan Note (Signed)
Productive cough, fever and appears ill Rapid flu negative Concern for early pneumonia We'll check chest x-ray Start antibiotic-doxycycline twice a day 10 days Hycodan cough syrup-advised this will cause drowsiness Rest, fluids Continue over-the-counter medications for symptom relief Call if no improvement

## 2016-08-17 NOTE — Assessment & Plan Note (Signed)
His symptoms are consistent with a possible flu Rapid flu test negative

## 2016-08-24 LAB — HM DIABETES EYE EXAM

## 2016-08-25 ENCOUNTER — Encounter: Payer: Self-pay | Admitting: Internal Medicine

## 2016-08-30 NOTE — Progress Notes (Signed)
Subjective:    Patient ID: Russell Trevino, male    DOB: [birth year], 65 y.o.   MRN: [id number]  HPI NO SHOW   Medications and allergies reviewed with patient and updated if appropriate.  Patient Active Problem List   Diagnosis Date Noted  . Flu-like symptoms 08/17/2016  . Cough 08/17/2016  . Fever 08/17/2016  . Thoracic back pain 06/21/2016  . Hx of CABG 05/26/2016  . Prostate cancer ([place]) 05/26/2016  . Essential hypertension, benign 05/26/2016  . Hyperlipidemia 05/26/2016  . Cardiomyopathy ([place]) 03/25/2013  . Chronic coronary artery disease 03/25/2013  . Diabetes mellitus, type II ([place]) 01/09/2012  . Left ventricular systolic dysfunction 57/84/6962  . MI (myocardial infarction) 01/09/2012    Current Outpatient Prescriptions on File Prior to Visit  Medication Sig Dispense Refill  . aspirin 81 MG tablet Take 81 mg by mouth daily.    Marland Kitchen atorvastatin (LIPITOR) 80 MG tablet Take 1 tablet (80 mg total) by mouth daily. 90 tablet 3  . carvedilol (COREG) 6.25 MG tablet Take 6.25 mg by mouth daily.    . cyclobenzaprine (FLEXERIL) 5 MG tablet Take 1 tablet (5 mg total) by mouth 3 (three) times daily as needed for muscle spasms. 60 tablet 0  . doxycycline (VIBRA-TABS) 100 MG tablet Take 1 tablet (100 mg total) by mouth 2 (two) times daily. 20 tablet 0  . finasteride (PROSCAR) 5 MG tablet Take 1 tablet (5 mg total) by mouth daily. 90 tablet 3  . HYDROcodone-homatropine (HYCODAN) 5-1.5 MG/5ML syrup Take 5 mLs by mouth every 8 (eight) hours as needed for cough. 120 mL 0  . insulin NPH-regular Human (HUMULIN 70/30) (70-30) 100 UNIT/ML injection Take 50 units in AM, 45 units in PM 10 mL 11  . lisinopril (PRINIVIL,ZESTRIL) 30 MG tablet Take 1 tablet (30 mg total) by mouth daily. 90 tablet 3  . meloxicam (MOBIC) 15 MG tablet Take 1 tablet (15 mg total) by mouth daily. 30 tablet 0  . metFORMIN (GLUCOPHAGE) 1000 MG tablet Take 1 tablet (1,000 mg total) by mouth 2 (two) times daily with a meal.  180 tablet 1  . sildenafil (REVATIO) 20 MG tablet Take 1 tablet (20 mg total) by mouth 3 (three) times daily. 90 tablet 2   No current facility-administered medications on file prior to visit.     Past Medical History:  Diagnosis Date  . BPH (benign prostatic hypertrophy)   . Cardiomyopathy, ischemic   . Coronary artery disease CARDIOLOGIST- DR Frederico Hamman  ([facility])--  LAST VISIT APRIL 2013--  REQUEST FOR RECORD FAXED   LAST CARDIAC CATH 2009 (GRAFTS PATENT)/ LAST STRESS TEST 2010-  PER PT OK  . Diabetes mellitus, type 2 ([place])   . Frequency of urination   . Gross hematuria   . History of CHF (congestive heart failure) 2007  . History of kidney stones   . History of myocardial infarction 2006 --  NON ST ELEVATED  MI  S/P CABG  , [facility]  . History of urinary retention   . Hyperlipidemia   . Nocturia   . Renal calculi BILATERAL PER CT SCAN--  NON-OBSTRUCTIVE  . S/P CABG x 4 2006  . Urgency of urination     Past Surgical History:  Procedure Laterality Date  . CARDIAC CATHETERIZATION  01-17-2008  DR SANTOS ([facility])   GRAFTS PATENT / EF 40%  . CORONARY ARTERY BYPASS GRAFT  AUG 2006   X4  VESSEL  . CYSTO / RIGHT URETERAL STENT PLACEMENT  10-08-1999  .  CYSTO/  REMOVAL BLADDER STONES  01-14-2010  . CYSTOSCOPY  10/16/2012   Procedure: CYSTOSCOPY;  Surgeon: Malka So, MD;  Location[facility];  Service: Urology;  Laterality: N/A;  Cystoscopy with Clot Evacuation and fulguration bladder neck.  Marland Kitchen EXTRACORPOREAL SHOCK WAVE LITHOTRIPSY  10-15-2009   RIGHT  . KNEE ARTHROSCOPY  1978   RIGHT  . LEFT SHOULDER SURGERY   2002  . LUMBAR FUSION  09-10-2008   L4 - L5  . PERCUTANEOUS NEPHROSTOLITHOTOMY  1979  . PROSTATE BIOPSY  10/16/2012   Procedure: PROSTATE BIOPSY;  Surgeon: Malka So, MD;  Location: [facility];  Service: Urology;  Laterality: N/A;  Through ultrasound.  Marland Kitchen SHOULDER ARTHROSCOPY W/ SUBACROMIAL DECOMPRESSION AND DISTAL CLAVICLE EXCISION   10-11-2004   AND PARTIAL ROTATOR CUFF REPAIR  . TRANSTHORACIC ECHOCARDIOGRAM  01-09-2012  [facility]   LV SYSTOLIC FUNCTION MILDLY REDUCED/ EF 48%/ LV FILLING PATTERN  IS IMPAIRED/ HYPOKINESIS IN THE MID AND BASALAR SEPTUM & ANTEROSEPTUM  . TURP VAPORIZATION  01-19-2010   BPH W/ BOO  . URETEROLITHOTOMY  1976    Social History   Social History  . Marital status: Married    Spouse name: N/A  . Number of children: N/A  . Years of education: N/A   Social History Main Topics  . Smoking status: Never Smoker  . Smokeless tobacco: Never Used  . Alcohol use No  . Drug use: No  . Sexual activity: Not on file   Other Topics Concern  . Not on file   Social History Narrative  . No narrative on file    No family history on file.  Review of Systems     Objective:  There were no vitals filed for this visit. There were no vitals filed for this visit. There is no height or weight on file to calculate BMI.   Physical Exam          Assessment & Plan:       This encounter was created in error - please disregard.

## 2016-08-31 ENCOUNTER — Encounter: Payer: Medicare Other | Admitting: Internal Medicine

## 2016-10-03 DIAGNOSIS — H35033 Hypertensive retinopathy, bilateral: Secondary | ICD-10-CM | POA: Diagnosis not present

## 2016-10-03 DIAGNOSIS — H43821 Vitreomacular adhesion, right eye: Secondary | ICD-10-CM | POA: Diagnosis not present

## 2016-10-03 DIAGNOSIS — E113391 Type 2 diabetes mellitus with moderate nonproliferative diabetic retinopathy without macular edema, right eye: Secondary | ICD-10-CM | POA: Diagnosis not present

## 2016-10-03 DIAGNOSIS — E113312 Type 2 diabetes mellitus with moderate nonproliferative diabetic retinopathy with macular edema, left eye: Secondary | ICD-10-CM | POA: Diagnosis not present

## 2016-11-10 DIAGNOSIS — E119 Type 2 diabetes mellitus without complications: Secondary | ICD-10-CM | POA: Diagnosis not present

## 2016-11-10 DIAGNOSIS — I2510 Atherosclerotic heart disease of native coronary artery without angina pectoris: Secondary | ICD-10-CM | POA: Diagnosis not present

## 2016-11-10 DIAGNOSIS — R0789 Other chest pain: Secondary | ICD-10-CM | POA: Diagnosis not present

## 2016-11-10 DIAGNOSIS — Z794 Long term (current) use of insulin: Secondary | ICD-10-CM | POA: Diagnosis not present

## 2016-11-10 DIAGNOSIS — Z951 Presence of aortocoronary bypass graft: Secondary | ICD-10-CM | POA: Diagnosis not present

## 2016-11-10 DIAGNOSIS — I255 Ischemic cardiomyopathy: Secondary | ICD-10-CM | POA: Diagnosis not present

## 2016-11-10 DIAGNOSIS — I10 Essential (primary) hypertension: Secondary | ICD-10-CM | POA: Diagnosis not present

## 2016-11-10 DIAGNOSIS — Z7902 Long term (current) use of antithrombotics/antiplatelets: Secondary | ICD-10-CM | POA: Diagnosis not present

## 2016-11-10 DIAGNOSIS — Z79899 Other long term (current) drug therapy: Secondary | ICD-10-CM | POA: Diagnosis not present

## 2016-11-10 DIAGNOSIS — E785 Hyperlipidemia, unspecified: Secondary | ICD-10-CM | POA: Diagnosis not present

## 2016-11-10 DIAGNOSIS — Z7982 Long term (current) use of aspirin: Secondary | ICD-10-CM | POA: Diagnosis not present

## 2016-11-10 DIAGNOSIS — R9431 Abnormal electrocardiogram [ECG] [EKG]: Secondary | ICD-10-CM | POA: Diagnosis not present

## 2016-11-11 DIAGNOSIS — M7711 Lateral epicondylitis, right elbow: Secondary | ICD-10-CM | POA: Diagnosis not present

## 2016-11-17 DIAGNOSIS — H3582 Retinal ischemia: Secondary | ICD-10-CM | POA: Diagnosis not present

## 2016-11-17 DIAGNOSIS — H35033 Hypertensive retinopathy, bilateral: Secondary | ICD-10-CM | POA: Diagnosis not present

## 2016-11-17 DIAGNOSIS — E113413 Type 2 diabetes mellitus with severe nonproliferative diabetic retinopathy with macular edema, bilateral: Secondary | ICD-10-CM | POA: Diagnosis not present

## 2016-12-01 ENCOUNTER — Other Ambulatory Visit: Payer: Self-pay | Admitting: Internal Medicine

## 2016-12-08 ENCOUNTER — Other Ambulatory Visit: Payer: Self-pay | Admitting: Internal Medicine

## 2017-01-13 DIAGNOSIS — R310 Gross hematuria: Secondary | ICD-10-CM | POA: Diagnosis not present

## 2017-01-13 DIAGNOSIS — N200 Calculus of kidney: Secondary | ICD-10-CM | POA: Diagnosis not present

## 2017-01-26 DIAGNOSIS — N401 Enlarged prostate with lower urinary tract symptoms: Secondary | ICD-10-CM | POA: Diagnosis not present

## 2017-01-26 DIAGNOSIS — N200 Calculus of kidney: Secondary | ICD-10-CM | POA: Diagnosis not present

## 2017-01-26 DIAGNOSIS — R310 Gross hematuria: Secondary | ICD-10-CM | POA: Diagnosis not present

## 2017-05-02 ENCOUNTER — Other Ambulatory Visit: Payer: Self-pay | Admitting: Internal Medicine

## 2017-05-22 ENCOUNTER — Other Ambulatory Visit: Payer: Self-pay | Admitting: Internal Medicine

## 2017-06-06 ENCOUNTER — Other Ambulatory Visit: Payer: Self-pay | Admitting: Internal Medicine

## 2017-07-04 ENCOUNTER — Other Ambulatory Visit: Payer: Self-pay | Admitting: Internal Medicine

## 2017-07-06 ENCOUNTER — Telehealth: Payer: Self-pay | Admitting: Internal Medicine

## 2017-07-11 NOTE — Telephone Encounter (Signed)
Patient called back in to find out status.   Gave Lucy's response to patient.

## 2017-07-11 NOTE — Telephone Encounter (Signed)
Pt can wait until Thursday for renewal.../lmb

## 2017-07-11 NOTE — Telephone Encounter (Signed)
Pt called requesting this medication. He is scheduled for an appointment on 07/13/2017.

## 2017-07-12 NOTE — Progress Notes (Signed)
Subjective:    Patient ID: Russell Trevino, male    DOB: [birth year], 66 y.o.   MRN: [id number]  HPI  He is here for a physical exam.   Diabetes:  He is taking his medications daily.  He is checking his sugars and they are very high - in the 300-400's.  He eats 3 meals a day 85% of the time, the other days he eats twice a day.  He has not been exercising.  In general he avoids a lot of sugar.  He does not eat much carbohydrates.  He recently sprained his ankle and was not exercising and just sitting around and eating.  He states he gained 20 lbs in 6 weeks.    Hypertension:  He did not take his BP medication yet today, but usually takes them around 8-9 am. He does not check his BP at home.  He has experienced occasional chest pain, but cardiology several months ago.  He has SOB with exertion. He denies palpitations and regular headaches.  He has been experiencing a cough that is dry.  He has also had chest pain that cardiology did not feel was cardiac in nature and may be GERD.  He occasionally will burp a lot.  He denies GERD.  Medications and allergies reviewed with patient and updated if appropriate.  Patient Active Problem List   Diagnosis Date Noted  . Flu-like symptoms 08/17/2016  . Cough 08/17/2016  . Fever 08/17/2016  . Thoracic back pain 06/21/2016  . Hx of CABG 05/26/2016  . Prostate cancer ([place]) 05/26/2016  . Essential hypertension, benign 05/26/2016  . Hyperlipidemia 05/26/2016  . Cardiomyopathy ([place]) 03/25/2013  . Chronic coronary artery disease 03/25/2013  . Diabetes mellitus, type II ([place]) 01/09/2012  . Left ventricular systolic dysfunction 86/76/1950  . MI (myocardial infarction) ([place]) 01/09/2012    Current Outpatient Prescriptions on File Prior to Visit  Medication Sig Dispense Refill  . aspirin 81 MG tablet Take 81 mg by mouth daily.    Marland Kitchen atorvastatin (LIPITOR) 80 MG tablet Take 1 tablet (80 mg total) by mouth daily. 90 tablet 3  . BD VEO INSULIN SYR  ULTRAFINE 31G X 15/64" 1 ML MISC use as directed TO INJECT INSULIN  twice a day 10 each 0  . carvedilol (COREG) 6.25 MG tablet Take 6.25 mg by mouth daily.    . cyclobenzaprine (FLEXERIL) 5 MG tablet take 1 tablet by mouth three times a day if needed for muscle spasm 60 tablet 0  . finasteride (PROSCAR) 5 MG tablet Take 1 tablet (5 mg total) by mouth daily. 90 tablet 3  . HUMULIN 70/30 (70-30) 100 UNIT/ML injection inject 50 units subcutaneously every morning and then inject 45 units every evening 10 mL 0  . lisinopril (PRINIVIL,ZESTRIL) 30 MG tablet Take 1 tablet (30 mg total) by mouth daily. 90 tablet 3  . meloxicam (MOBIC) 15 MG tablet Take 1 tablet (15 mg total) by mouth daily. 30 tablet 0  . metFORMIN (GLUCOPHAGE) 1000 MG tablet Take 1 tablet (1,000 mg total) by mouth 2 (two) times daily with a meal. -- Office visit needed for further refills 60 tablet 0  . sildenafil (REVATIO) 20 MG tablet Take 1 tablet (20 mg total) by mouth 3 (three) times daily. -- Office visit needed for further refills 90 tablet 0   No current facility-administered medications on file prior to visit.     Past Medical History:  Diagnosis Date  . BPH (benign prostatic hypertrophy)   .  Cardiomyopathy, ischemic   . Coronary artery disease CARDIOLOGIST- DR Frederico Hamman  ([facility])--  LAST VISIT APRIL 2013--  REQUEST FOR RECORD FAXED   LAST CARDIAC CATH 2009 (GRAFTS PATENT)/ LAST STRESS TEST 2010-  PER PT OK  . Diabetes mellitus, type 2 ([place])   . Frequency of urination   . Gross hematuria   . History of CHF (congestive heart failure) 2007  . History of kidney stones   . History of myocardial infarction 2006 --  NON ST ELEVATED  MI  S/P CABG  , [facility]  . History of urinary retention   . Hyperlipidemia   . Nocturia   . Renal calculi BILATERAL PER CT SCAN--  NON-OBSTRUCTIVE  . S/P CABG x 4 2006  . Urgency of urination     Past Surgical History:  Procedure Laterality Date  . CARDIAC CATHETERIZATION  01-17-2008  DR  SANTOS ([facility])   GRAFTS PATENT / EF 40%  . CORONARY ARTERY BYPASS GRAFT  AUG 2006   X4  VESSEL  . CYSTO / RIGHT URETERAL STENT PLACEMENT  10-08-1999  . CYSTO/  REMOVAL BLADDER STONES  01-14-2010  . CYSTOSCOPY  10/16/2012   Procedure: CYSTOSCOPY;  Surgeon: Malka So, MD;  Location: [facility];  Service: Urology;  Laterality: N/A;  Cystoscopy with Clot Evacuation and fulguration bladder neck.  Marland Kitchen EXTRACORPOREAL SHOCK WAVE LITHOTRIPSY  10-15-2009   RIGHT  . KNEE ARTHROSCOPY  1978   RIGHT  . LEFT SHOULDER SURGERY   2002  . LUMBAR FUSION  09-10-2008   L4 - L5  . PERCUTANEOUS NEPHROSTOLITHOTOMY  1979  . PROSTATE BIOPSY  10/16/2012   Procedure: PROSTATE BIOPSY;  Surgeon: Malka So, MD;  Location: [facility];  Service: Urology;  Laterality: N/A;  Through ultrasound.  Marland Kitchen SHOULDER ARTHROSCOPY W/ SUBACROMIAL DECOMPRESSION AND DISTAL CLAVICLE EXCISION  10-11-2004   AND PARTIAL ROTATOR CUFF REPAIR  . TRANSTHORACIC ECHOCARDIOGRAM  01-09-2012  [facility]   LV SYSTOLIC FUNCTION MILDLY REDUCED/ EF 48%/ LV FILLING PATTERN  IS IMPAIRED/ HYPOKINESIS IN THE MID AND BASALAR SEPTUM & ANTEROSEPTUM  . TURP VAPORIZATION  01-19-2010   BPH W/ BOO  . URETEROLITHOTOMY  1976    Social History   Social History  . Marital status: Married    Spouse name: N/A  . Number of children: N/A  . Years of education: N/A   Social History Main Topics  . Smoking status: Never Smoker  . Smokeless tobacco: Never Used  . Alcohol use No  . Drug use: No  . Sexual activity: Not Asked   Other Topics Concern  . None   Social History Narrative  . None    No family history on file.  Review of Systems  Constitutional: Negative for chills and fever.  Eyes: Negative for visual disturbance.  Respiratory: Positive for cough (dry, occ) and shortness of breath (with exertion). Negative for wheezing.   Cardiovascular: Positive for chest pain (occasional). Negative for palpitations and leg  swelling.  Gastrointestinal: Negative for abdominal pain, blood in stool, constipation, diarrhea and nausea.       Burps rare, no gerd  Genitourinary: Negative for dysuria and hematuria.  Musculoskeletal: Positive for arthralgias (shoulder pain). Negative for back pain.  Skin: Negative for color change and rash.  Neurological: Positive for numbness (numbness/ tingling in feet). Negative for light-headedness and headaches.  Hematological: Bruises/bleeds easily.  Psychiatric/Behavioral: Negative for dysphoric mood. The patient is not nervous/anxious.        Objective:  Vitals:   07/13/17 0918  BP: (!) 184/80  Pulse: 65  Resp: 16  Temp: 98.6 F (37 C)  SpO2: 98%   Filed Weights   07/13/17 0918  Weight: 255 lb (115.7 kg)   Body mass index is 34.58 kg/m.  Wt Readings from Last 3 Encounters:  07/13/17 255 lb (115.7 kg)  08/17/16 255 lb (115.7 kg)  06/21/16 256 lb (116.1 kg)     Physical Exam Constitutional: He appears well-developed and well-nourished. No distress.  HENT:  Head: Normocephalic and atraumatic.  Right Ear: External ear normal.  Left Ear: External ear normal.  Mouth/Throat: Oropharynx is clear and moist.  Normal ear canals and TM b/l  Eyes: Conjunctivae and EOM are normal.  Neck: Neck supple. No tracheal deviation present. No thyromegaly present.  No carotid bruit  Cardiovascular: Normal rate, regular rhythm, normal heart sounds and intact distal pulses.   No murmur heard. Pulmonary/Chest: Effort normal and breath sounds normal. No respiratory distress. He has no wheezes. He has no rales.  Abdominal: Soft. Umbilical hernia that is reducible, non tender.  He exhibits no distension. There is no tenderness.  Genitourinary: deferred  Musculoskeletal: He exhibits no edema.  Lymphadenopathy:   He has no cervical adenopathy.  Skin: Skin is warm and dry. He is not diaphoretic.  Psychiatric: He has a normal mood and affect. His behavior is normal.           Assessment & Plan:   Physical exam: Screening blood work ordered Immunizations  prevnar today , shingrix discussed Colonoscopy  ? Due 2019 -- he will be out of town most of the next few months so we will refer to GI next year for a colonoscopy Eye exams   Up to date  EKG   Last done 2014 Exercise  None - stressed regular exercise - trying to increase Weight  Obese - stressed weight loss Skin no concerns Substance abuse  none  See Problem List for Assessment and Plan of chronic medical problems.  FU in 3 weeks.

## 2017-07-13 ENCOUNTER — Telehealth: Payer: Self-pay | Admitting: Internal Medicine

## 2017-07-13 ENCOUNTER — Ambulatory Visit (INDEPENDENT_AMBULATORY_CARE_PROVIDER_SITE_OTHER): Payer: Medicare Other | Admitting: Internal Medicine

## 2017-07-13 ENCOUNTER — Other Ambulatory Visit: Payer: Self-pay | Admitting: Internal Medicine

## 2017-07-13 ENCOUNTER — Other Ambulatory Visit (INDEPENDENT_AMBULATORY_CARE_PROVIDER_SITE_OTHER): Payer: Medicare Other

## 2017-07-13 ENCOUNTER — Encounter: Payer: Self-pay | Admitting: Internal Medicine

## 2017-07-13 VITALS — BP 184/80 | HR 65 | Temp 98.6°F | Resp 16 | Ht 72.0 in | Wt 255.0 lb

## 2017-07-13 DIAGNOSIS — Z23 Encounter for immunization: Secondary | ICD-10-CM | POA: Diagnosis not present

## 2017-07-13 DIAGNOSIS — I2510 Atherosclerotic heart disease of native coronary artery without angina pectoris: Secondary | ICD-10-CM | POA: Diagnosis not present

## 2017-07-13 DIAGNOSIS — I429 Cardiomyopathy, unspecified: Secondary | ICD-10-CM | POA: Diagnosis not present

## 2017-07-13 DIAGNOSIS — Z0000 Encounter for general adult medical examination without abnormal findings: Secondary | ICD-10-CM

## 2017-07-13 DIAGNOSIS — Z0001 Encounter for general adult medical examination with abnormal findings: Secondary | ICD-10-CM | POA: Diagnosis not present

## 2017-07-13 DIAGNOSIS — I10 Essential (primary) hypertension: Secondary | ICD-10-CM | POA: Diagnosis not present

## 2017-07-13 DIAGNOSIS — E7849 Other hyperlipidemia: Secondary | ICD-10-CM

## 2017-07-13 DIAGNOSIS — E1165 Type 2 diabetes mellitus with hyperglycemia: Secondary | ICD-10-CM

## 2017-07-13 DIAGNOSIS — K219 Gastro-esophageal reflux disease without esophagitis: Secondary | ICD-10-CM | POA: Diagnosis not present

## 2017-07-13 DIAGNOSIS — C61 Malignant neoplasm of prostate: Secondary | ICD-10-CM

## 2017-07-13 LAB — COMPREHENSIVE METABOLIC PANEL
ALBUMIN: 3.5 g/dL (ref 3.5–5.2)
ALT: 15 U/L (ref 0–53)
AST: 12 U/L (ref 0–37)
Alkaline Phosphatase: 71 U/L (ref 39–117)
BILIRUBIN TOTAL: 1.2 mg/dL (ref 0.2–1.2)
BUN: 18 mg/dL (ref 6–23)
CALCIUM: 9.5 mg/dL (ref 8.4–10.5)
CO2: 31 meq/L (ref 19–32)
CREATININE: 1.22 mg/dL (ref 0.40–1.50)
Chloride: 101 mEq/L (ref 96–112)
GFR: 62.96 mL/min (ref >60.00)
Glucose, Bld: 390 mg/dL — ABNORMAL HIGH (ref 70–99)
Potassium: 5 mEq/L (ref 3.5–5.1)
Sodium: 136 mEq/L (ref 135–145)
Total Protein: 6.6 g/dL (ref 6.0–8.3)

## 2017-07-13 LAB — CBC WITH DIFFERENTIAL/PLATELET
Basophils Absolute: 0.1 10*3/uL (ref 0.0–0.1)
Basophils Relative: 1.4 % (ref 0.0–3.0)
Eosinophils Absolute: 0.2 10*3/uL (ref 0.0–0.7)
Eosinophils Relative: 2.7 % (ref 0.0–5.0)
HCT: 41.4 % (ref 39.0–52.0)
HEMOGLOBIN: 14.3 g/dL (ref 13.0–17.0)
LYMPHS PCT: 21.1 % (ref 12.0–46.0)
Lymphs Abs: 1.4 10*3/uL (ref 0.7–4.0)
MCHC: 34.6 g/dL (ref 30.0–36.0)
MCV: 90.6 fl (ref 78.0–100.0)
MONO ABS: 0.6 10*3/uL (ref 0.1–1.0)
MONOS PCT: 8.6 % (ref 3.0–12.0)
Neutro Abs: 4.3 10*3/uL (ref 1.4–7.7)
Neutrophils Relative %: 66.2 % (ref 43.0–77.0)
Platelets: 157 10*3/uL (ref 150.0–400.0)
RBC: 4.57 Mil/uL (ref 4.22–5.81)
RDW: 13.9 % (ref 11.5–15.5)
WBC: 6.4 10*3/uL (ref 4.0–10.5)

## 2017-07-13 LAB — LIPID PANEL
CHOL/HDL RATIO: 5
CHOLESTEROL: 255 mg/dL — AB (ref 0–200)
HDL: 48.2 mg/dL (ref >39.00)
NonHDL: 206.62
Triglycerides: 268 mg/dL — ABNORMAL HIGH (ref 0.0–149.0)
VLDL: 53.6 mg/dL — ABNORMAL HIGH (ref 0.0–40.0)

## 2017-07-13 LAB — TSH: TSH: 1.86 u[IU]/mL (ref 0.35–4.50)

## 2017-07-13 LAB — HEMOGLOBIN A1C: Hgb A1c MFr Bld: 12.3 % — ABNORMAL HIGH (ref 4.6–6.5)

## 2017-07-13 LAB — LDL CHOLESTEROL, DIRECT: LDL DIRECT: 155 mg/dL

## 2017-07-13 MED ORDER — SILDENAFIL CITRATE 20 MG PO TABS: 20.0000 mg | ORAL_TABLET | Freq: Three times a day (TID) | ORAL | 0 refills | Status: DC

## 2017-07-13 MED ORDER — INSULIN NPH ISOPHANE & REGULAR (70-30) 100 UNIT/ML ~~LOC~~ SUSP: SUBCUTANEOUS | 3 refills | Status: DC

## 2017-07-13 MED ORDER — OMEPRAZOLE 20 MG PO CPDR: 20.0000 mg | DELAYED_RELEASE_CAPSULE | Freq: Every day | ORAL | 5 refills | Status: DC

## 2017-07-13 MED ORDER — AMLODIPINE BESYLATE 5 MG PO TABS: 5.0000 mg | ORAL_TABLET | Freq: Every day | ORAL | 3 refills | Status: DC

## 2017-07-13 NOTE — Assessment & Plan Note (Addendum)
Not controlled Add amlodipine 5 mg daily Continue coreg 6.25 mg BID and lisinopril 30 mg daily Increase exercise, work on weight loss FU in 3 weeks

## 2017-07-13 NOTE — Assessment & Plan Note (Signed)
Likely the cough, chest pain and burping is related to GERD Start omeprazole 20 mg daily GERD diet

## 2017-07-13 NOTE — Assessment & Plan Note (Signed)
Following with Dr Jeffie Pollock

## 2017-07-13 NOTE — Assessment & Plan Note (Signed)
Following with cardiology - visits up to date If chest pain continues advised him to discuss this with cardiology again -- possibly related to GERD so we will treat that

## 2017-07-13 NOTE — Telephone Encounter (Signed)
Patient requesting sildenafil to be sent to [facility].  Patient would like to know if this can be sent in before 5pm if possible.

## 2017-07-13 NOTE — Patient Instructions (Addendum)
Test(s) ordered today. Your results will be released to [place] (or called to you) after review, usually within 72hours after test completion. If any changes need to be made, you will be notified at that same time.  All other Health Maintenance issues reviewed.   All recommended immunizations and age-appropriate screenings are up-to-date or discussed.  prevnar immunization administered today.   Medications reviewed and updated.  Changes include starting amlodipine 5 mg for your blood pressure.  Start omeprazole 20 mg for your stomach.  Increase your insulin to 55 units and 50 units.    Your prescription(s) have been submitted to your pharmacy. Please take as directed and contact our office if you believe you are having problem(s) with the medication(s).  A referral was ordered for endocrionology  Please followup in 3 weeks    Health Maintenance, Male A healthy lifestyle and preventive care is important for your health and wellness. Ask your health care provider about what schedule of regular examinations is right for you. What should I know about weight and diet? Eat a Healthy Diet  Eat plenty of vegetables, fruits, whole grains, low-fat dairy products, and lean protein.  Do not eat a lot of foods high in solid fats, added sugars, or salt.  Maintain a Healthy Weight Regular exercise can help you achieve or maintain a healthy weight. You should:  Do at least 150 minutes of exercise each week. The exercise should increase your heart rate and make you sweat (moderate-intensity exercise).  Do strength-training exercises at least twice a week.  Watch Your Levels of Cholesterol and Blood Lipids  Have your blood tested for lipids and cholesterol every 5 years starting at 67 years of age. If you are at high risk for heart disease, you should start having your blood tested when you are 67 years old. You may need to have your cholesterol levels checked more often if: ? Your lipid or  cholesterol levels are high. ? You are older than 67 years of age. ? You are at high risk for heart disease.  What should I know about cancer screening? Many types of cancers can be detected early and may often be prevented. Lung Cancer  You should be screened every year for lung cancer if: ? You are a current smoker who has smoked for at least 30 years. ? You are a former smoker who has quit within the past 15 years.  Talk to your health care provider about your screening options, when you should start screening, and how often you should be screened.  Colorectal Cancer  Routine colorectal cancer screening usually begins at 67 years of age and should be repeated every 5-10 years until you are 67 years old. You may need to be screened more often if early forms of precancerous polyps or small growths are found. Your health care provider may recommend screening at an earlier age if you have risk factors for colon cancer.  Your health care provider may recommend using home test kits to check for hidden blood in the stool.  A small camera at the end of a tube can be used to examine your colon (sigmoidoscopy or colonoscopy). This checks for the earliest forms of colorectal cancer.  Prostate and Testicular Cancer  Depending on your age and overall health, your health care provider may do certain tests to screen for prostate and testicular cancer.  Talk to your health care provider about any symptoms or concerns you have about testicular or prostate cancer.  Skin  Cancer  Check your skin from head to toe regularly.  Tell your health care provider about any new moles or changes in moles, especially if: ? There is a change in a mole's size, shape, or color. ? You have a mole that is larger than a pencil eraser.  Always use sunscreen. Apply sunscreen liberally and repeat throughout the day.  Protect yourself by wearing long sleeves, pants, a wide-brimmed hat, and sunglasses when  outside.  What should I know about heart disease, diabetes, and high blood pressure?  If you are 40-68 years of age, have your blood pressure checked every 3-5 years. If you are 67 years of age or older, have your blood pressure checked every year. You should have your blood pressure measured twice-once when you are at a hospital or clinic, and once when you are not at a hospital or clinic. Record the average of the two measurements. To check your blood pressure when you are not at a hospital or clinic, you can use: ? An automated blood pressure machine at a pharmacy. ? A home blood pressure monitor.  Talk to your health care provider about your target blood pressure.  If you are between 27-49 years old, ask your health care provider if you should take aspirin to prevent heart disease.  Have regular diabetes screenings by checking your fasting blood sugar level. ? If you are at a normal weight and have a low risk for diabetes, have this test once every three years after the age of 24. ? If you are overweight and have a high risk for diabetes, consider being tested at a younger age or more often.  A one-time screening for abdominal aortic aneurysm (AAA) by ultrasound is recommended for men aged 65-75 years who are current or former smokers. What should I know about preventing infection? Hepatitis B If you have a higher risk for hepatitis B, you should be screened for this virus. Talk with your health care provider to find out if you are at risk for hepatitis B infection. Hepatitis C Blood testing is recommended for:  Everyone born from [birth year] through 1966.  Anyone with known risk factors for hepatitis C.  Sexually Transmitted Diseases (STDs)  You should be screened each year for STDs including gonorrhea and chlamydia if: ? You are sexually active and are younger than 67 years of age. ? You are older than 66 years of age and your health care provider tells you that you are at risk for this  type of infection. ? Your sexual activity has changed since you were last screened and you are at an increased risk for chlamydia or gonorrhea. Ask your health care provider if you are at risk.  Talk with your health care provider about whether you are at high risk of being infected with HIV. Your health care provider may recommend a prescription medicine to help prevent HIV infection.  What else can I do?  Schedule regular health, dental, and eye exams.  Stay current with your vaccines (immunizations).  Do not use any tobacco products, such as cigarettes, chewing tobacco, and e-cigarettes. If you need help quitting, ask your health care provider.  Limit alcohol intake to no more than 2 drinks per day. One drink equals 12 ounces of beer, 5 ounces of wine, or 1 ounces of hard liquor.  Do not use street drugs.  Do not share needles.  Ask your health care provider for help if you need support or information about quitting drugs.  Tell your health care provider if you often feel depressed.  Tell your health care provider if you have ever been abused or do not feel safe at home. This information is not intended to replace advice given to you by your health care provider. Make sure you discuss any questions you have with your health care provider. Document Released: 02/25/2008 Document Revised: 04/27/2016 Document Reviewed: 06/02/2015 Elsevier Interactive Patient Education  [employer].

## 2017-07-13 NOTE — Assessment & Plan Note (Signed)
Uncontrolled Has not followed up as instructed Sugars at home 300-400's Stressed regular exercise and weight loss Diabetic diet Check a1c today Refer to endo Will increase insulin by 5 units in morning and night

## 2017-07-13 NOTE — Assessment & Plan Note (Addendum)
last TTE Apr, 2013, EF 48%. euvolemnic on exam Continue current medications Work on increasing exercise and weight loss

## 2017-07-13 NOTE — Assessment & Plan Note (Signed)
Check lipid panel  Continue daily statin Regular exercise and healthy diet encouraged  

## 2017-07-14 ENCOUNTER — Other Ambulatory Visit: Payer: Self-pay | Admitting: Emergency Medicine

## 2017-07-14 MED ORDER — CARVEDILOL 6.25 MG PO TABS: 6.2500 mg | ORAL_TABLET | Freq: Every day | ORAL | 3 refills | Status: DC

## 2017-07-14 MED ORDER — ATORVASTATIN CALCIUM 80 MG PO TABS: 80.0000 mg | ORAL_TABLET | Freq: Every day | ORAL | 3 refills | Status: DC

## 2017-07-14 MED ORDER — METFORMIN HCL 1000 MG PO TABS: 1000.0000 mg | ORAL_TABLET | Freq: Two times a day (BID) | ORAL | 1 refills | Status: DC

## 2017-07-14 MED ORDER — FINASTERIDE 5 MG PO TABS: 5.0000 mg | ORAL_TABLET | Freq: Every day | ORAL | 3 refills | Status: DC

## 2017-07-14 MED ORDER — SILDENAFIL CITRATE 20 MG PO TABS: 20.0000 mg | ORAL_TABLET | Freq: Three times a day (TID) | ORAL | 2 refills | Status: DC

## 2017-07-14 MED ORDER — AMLODIPINE BESYLATE 5 MG PO TABS: 5.0000 mg | ORAL_TABLET | Freq: Every day | ORAL | 3 refills | Status: DC

## 2017-07-14 MED ORDER — LISINOPRIL 30 MG PO TABS: 30.0000 mg | ORAL_TABLET | Freq: Every day | ORAL | 3 refills | Status: DC

## 2017-07-29 NOTE — Progress Notes (Signed)
Subjective:    Patient ID: Russell Trevino, male    DOB: July 12, 1951, 66 y.o.   MRN: [id number]  HPI The patient is here for follow up.  Hypertension: we added amlodipine 5 mg daily 3 weeks ago.  He is taking his medication daily. He is compliant with a low sodium diet.  He has leg edema and it may be slightly worse than prior.  He has been on his feet more.  He has some SOB with some activities - he is more active.  He denies chest pain, palpitations, and regular headaches. He is exercising some - walking, 2.2 miles daily.  He is also remodeling a beach house he just bought.  He does not monitor his blood pressure at home regularly, but this morning it was about what it was here.    Diabetes: we increased his insulin by 5 units in the morning and 5 units in the evening 3 weeks ago.  He has been taking 60 units in the morning and 50 units in the evening.  He was referred to endo and has an appointment next month.  .  He is taking his medication daily as prescribed. He is more compliant with a diabetic diet. He is exercising. He monitors his sugars and they have been running 200's in the morning, lower at night - 160 at night.    GERD:  He was having a dry cough and chest pain that sounded like atypical gerd and he was started on omeprazole 20 mg daily.  He is taking his medication daily as prescribed.  He has not seen much improvement - he still is burping a lot.   Hyperlipidemia: He is taking his medication daily. He is compliant with a low fat/cholesterol diet. He is exercising regularly. He denies myalgias.    Medications and allergies reviewed with patient and updated if appropriate.  Patient Active Problem List   Diagnosis Date Noted  . GERD (gastroesophageal reflux disease) 07/13/2017  . Cough 08/17/2016  . Thoracic back pain 06/21/2016  . Hx of CABG 05/26/2016  . Prostate cancer ([place]) 05/26/2016  . Essential hypertension, benign 05/26/2016  . Hyperlipidemia 05/26/2016  .  Cardiomyopathy ([place]) 03/25/2013  . Chronic coronary artery disease 03/25/2013  . Uncontrolled diabetes mellitus ([place]) 01/09/2012  . Left ventricular systolic dysfunction [id number]/81/8299  . MI (myocardial infarction) ([place]) 01/09/2012    Current Outpatient Medications on File Prior to Visit  Medication Sig Dispense Refill  . amLODipine (NORVASC) 5 MG tablet Take 1 tablet (5 mg total) by mouth daily. 90 tablet 3  . aspirin 81 MG tablet Take 81 mg by mouth daily.    Marland Kitchen atorvastatin (LIPITOR) 80 MG tablet Take 1 tablet (80 mg total) by mouth daily. 90 tablet 3  . BD VEO INSULIN SYR ULTRAFINE 31G X 15/64" 1 ML MISC use as directed TO INJECT INSULIN  twice a day 10 each 0  . carvedilol (COREG) 6.25 MG tablet Take 1 tablet (6.25 mg total) by mouth daily. 90 tablet 3  . cyclobenzaprine (FLEXERIL) 5 MG tablet take 1 tablet by mouth three times a day if needed for muscle spasm 60 tablet 0  . finasteride (PROSCAR) 5 MG tablet Take 1 tablet (5 mg total) by mouth daily. 90 tablet 3  . insulin NPH-regular Human (HUMULIN 70/30) (70-30) 100 UNIT/ML injection Inject 55 units in the morning and 50 units at night 40 mL 3  . lisinopril (PRINIVIL,ZESTRIL) 30 MG tablet Take 1 tablet (30 mg total)  by mouth daily. 90 tablet 3  . metFORMIN (GLUCOPHAGE) 1000 MG tablet Take 1 tablet (1,000 mg total) by mouth 2 (two) times daily with a meal. 180 tablet 1  . omeprazole (PRILOSEC) 20 MG capsule Take 1 capsule (20 mg total) by mouth daily. Take 30 min prior to a meal 30 capsule 5  . sildenafil (REVATIO) 20 MG tablet Take 1 tablet (20 mg total) by mouth 3 (three) times daily. 90 tablet 2   No current facility-administered medications on file prior to visit.     Past Medical History:  Diagnosis Date  . BPH (benign prostatic hypertrophy)   . Cardiomyopathy, ischemic   . Coronary artery disease CARDIOLOGIST- DR Frederico Hamman  ([facility])--  LAST VISIT APRIL 2013--  REQUEST FOR RECORD FAXED   LAST CARDIAC CATH 2009 (GRAFTS PATENT)/ LAST  STRESS TEST 2010-  PER PT OK  . Diabetes mellitus, type 2 ([place])   . Frequency of urination   . Gross hematuria   . History of CHF (congestive heart failure) 2007  . History of kidney stones   . History of myocardial infarction 2006 --  NON ST ELEVATED  MI  S/P CABG  , [facility]  . History of urinary retention   . Hyperlipidemia   . Nocturia   . Renal calculi BILATERAL PER CT SCAN--  NON-OBSTRUCTIVE  . S/P CABG x 4 2006  . Urgency of urination     Past Surgical History:  Procedure Laterality Date  . CARDIAC CATHETERIZATION  01-17-2008  DR SANTOS ([facility])   GRAFTS PATENT / EF 40%  . CORONARY ARTERY BYPASS GRAFT  AUG 2006   X4  VESSEL  . CYSTO / RIGHT URETERAL STENT PLACEMENT  10-08-1999  . CYSTO/  REMOVAL BLADDER STONES  01-14-2010  . CYSTOSCOPY N/A 10/16/2012   Per[facility]ine Seal[facility] at [facility]  . EXTRACORPOREAL SHOCK WAVE LITHOTRIPSY  10-15-2009   RIGHT  . KNEE ARTHROSCOPY  1978   RIGHT  . LEFT SHOULDER SURGERY   2002  . LUMBAR FUSION  09-10-2008   L4 - L5  . PERCUTANEOUS NEPHROSTOLITHOTOMY  1979  . PROSTATE BIOPSY N/A 10/16/2012   Performed by Irine Seal[facility] MD at [facility]  . SHOULDER ARTHROSCOPY W/ SUBACROMIAL DECOMPRESSION AND DISTAL CLAVICLE EXCISION  10-11-2004   AND PARTIAL ROTATOR CUFF REPAIR  . TRANSTHORACIC ECHOCARDIOGRAM  01-09-2012  [facility]   LV SYSTOLIC FUNCTION MILDLY REDUCED/ EF 48%/ LV FILLING PATTERN  IS IMPAIRED/ HYPOKINESIS IN THE MID AND BASALAR SEPTUM & ANTEROSEPTUM  . TURP VAPORIZATION  01-19-2010   BPH W/ BOO  . URETEROLITHOTOMY  1976    Social History   Socioeconomic History  . Marital status: Married    Spouse name: None  . Number of children: None  . Years of education: None  . Highest education level: None  Social Needs  . Financial resource strain: None  . Food insecurity - worry: None  . Food insecurity - inability: None  . Transportation needs - medical: None  . Transportation needs -  non-medical: None  Occupational History  . None  Tobacco Use  . Smoking status: Never Smoker  . Smokeless tobacco: Never Used  Substance and Sexual Activity  . Alcohol use: No  . Drug use: No  . Sexual activity: None  Other Topics Concern  . None  Social History Narrative  . None    Family History  Problem Relation Age of Onset  . Diabetes Father  Review of Systems  Constitutional: Negative for chills and fever.  Respiratory: Positive for shortness of breath (sometimes). Negative for cough and wheezing.   Cardiovascular: Positive for leg swelling. Negative for chest pain and palpitations.  Gastrointestinal:       Increased burping  Neurological: Positive for light-headedness (one episode). Negative for headaches.       Objective:   Vitals:   07/31/17 1045  BP: (!) 152/60  Pulse: 73  Resp: 16  Temp: 98.5 F (36.9 C)  SpO2: 97%   Wt Readings from Last 3 Encounters:  07/31/17 261 lb (118.4 kg)  07/13/17 255 lb (115.7 kg)  08/17/16 255 lb (115.7 kg)   Body mass index is 35.4 kg/m.   Physical Exam    Constitutional: Appears well-developed and well-nourished. No distress.  HENT:  Head: Normocephalic and atraumatic.  Neck: Neck supple. No tracheal deviation present. No thyromegaly present.  No cervical lymphadenopathy Cardiovascular: Normal rate, regular rhythm and normal heart sounds.   No murmur heard. No carotid bruit .  1 + pitting edema LLE > RLE Pulmonary/Chest: Effort normal and breath sounds normal. No respiratory distress. No has no wheezes. No rales.  Skin: Skin is warm and dry. Not diaphoretic.  Psychiatric: Normal mood and affect. Behavior is normal.      Assessment & Plan:    See Problem List for Assessment and Plan of chronic medical problems.

## 2017-07-29 NOTE — Patient Instructions (Addendum)
  Medications reviewed and updated.  Changes include increasing lisinopril to 40 mg daily and increasing omeprazole to 40 mg daily.   Increase your insulin to 55 units at night.   Your prescription(s) have been submitted to your pharmacy. Please take as directed and contact our office if you believe you are having problem(s) with the medication(s).   Please followup in 3 months

## 2017-07-31 ENCOUNTER — Encounter: Payer: Self-pay | Admitting: Internal Medicine

## 2017-07-31 ENCOUNTER — Ambulatory Visit: Payer: Medicare Other | Admitting: Internal Medicine

## 2017-07-31 VITALS — BP 152/60 | HR 73 | Temp 98.5°F | Resp 16 | Wt 261.0 lb

## 2017-07-31 DIAGNOSIS — E1165 Type 2 diabetes mellitus with hyperglycemia: Secondary | ICD-10-CM

## 2017-07-31 DIAGNOSIS — K219 Gastro-esophageal reflux disease without esophagitis: Secondary | ICD-10-CM

## 2017-07-31 DIAGNOSIS — N401 Enlarged prostate with lower urinary tract symptoms: Secondary | ICD-10-CM | POA: Diagnosis not present

## 2017-07-31 DIAGNOSIS — C61 Malignant neoplasm of prostate: Secondary | ICD-10-CM | POA: Diagnosis not present

## 2017-07-31 DIAGNOSIS — Z87442 Personal history of urinary calculi: Secondary | ICD-10-CM | POA: Diagnosis not present

## 2017-07-31 DIAGNOSIS — E7849 Other hyperlipidemia: Secondary | ICD-10-CM

## 2017-07-31 DIAGNOSIS — I10 Essential (primary) hypertension: Secondary | ICD-10-CM | POA: Diagnosis not present

## 2017-07-31 DIAGNOSIS — R972 Elevated prostate specific antigen [PSA]: Secondary | ICD-10-CM | POA: Diagnosis not present

## 2017-07-31 MED ORDER — OMEPRAZOLE 40 MG PO CPDR: 40.0000 mg | DELAYED_RELEASE_CAPSULE | Freq: Every day | ORAL | 1 refills | Status: DC

## 2017-07-31 MED ORDER — INSULIN NPH ISOPHANE & REGULAR (70-30) 100 UNIT/ML ~~LOC~~ SUSP: SUBCUTANEOUS | 3 refills | Status: DC

## 2017-07-31 MED ORDER — LISINOPRIL 40 MG PO TABS: 40.0000 mg | ORAL_TABLET | Freq: Every day | ORAL | 3 refills | Status: DC

## 2017-08-01 NOTE — Assessment & Plan Note (Signed)
Lifestyle has improved Sugars improving Continue 60 units in morning and increase to 55 units at night Sees endo next month Stressed continuing regular exercise, diabetic diet Work on weight loss

## 2017-08-01 NOTE — Assessment & Plan Note (Signed)
Not controlled Increase omeprazole to 40 mg daily

## 2017-08-01 NOTE — Assessment & Plan Note (Signed)
BP still elevated Increase lisinopril to 40 mg daily, continue other meds Stressed low sodium diet Continue regular exercise Work on weight loss

## 2017-08-01 NOTE — Assessment & Plan Note (Signed)
Was not taking his statin, but is taking it now Continue statin

## 2017-08-24 ENCOUNTER — Ambulatory Visit: Payer: Medicare Other | Admitting: Endocrinology

## 2017-08-24 ENCOUNTER — Encounter: Payer: Self-pay | Admitting: Endocrinology

## 2017-08-24 DIAGNOSIS — Z794 Long term (current) use of insulin: Secondary | ICD-10-CM

## 2017-08-24 DIAGNOSIS — E1122 Type 2 diabetes mellitus with diabetic chronic kidney disease: Secondary | ICD-10-CM

## 2017-08-24 DIAGNOSIS — N183 Chronic kidney disease, stage 3 unspecified: Secondary | ICD-10-CM

## 2017-08-24 MED ORDER — INSULIN NPH ISOPHANE & REGULAR (70-30) 100 UNIT/ML ~~LOC~~ SUSP: SUBCUTANEOUS | 3 refills | Status: DC

## 2017-08-24 NOTE — Patient Instructions (Addendum)
good diet and exercise significantly improve the control of your diabetes.  please let me know if you wish to be referred to a dietician.  high blood sugar is very risky to your health.  you should see an eye doctor and dentist every year.  It is very important to get all recommended vaccinations.  Controlling your blood pressure and cholesterol drastically reduces the damage diabetes does to your body.  Those who smoke should quit.  Please discuss these with your doctor.  check your blood sugar twice a day.  vary the time of day when you check, between before the 3 meals, and at bedtime.  also check if you have symptoms of your blood sugar being too high or too low.  please keep a record of the readings and bring it to your next appointment here (or you can bring the meter itself).  You can write it on any piece of paper.  please call us sooner if your blood sugar goes below 70, or if you have a lot of readings over 200.   For now, please: Increase the insulin to 80 units with breakfast, and 55 units with supper.   Please continue the same metformin.   Please come back for a follow-up appointment in 1 month.  Please call or message [place] next week, to tell us how the blood sugar is doing.

## 2017-08-24 NOTE — Progress Notes (Signed)
Subjective:    Patient ID: Russell Trevino, male    DOB: [birth year], 66 y.o.   MRN: [id number]  HPI pt is referred by Dr Quay Burow, for diabetes.  Pt states DM was dx'ed in 1996; he has mild neuropathy of the lower extremities; he has associated DR, renal insuff, and CAD; he has been on insulin since 2001; pt says his diet and exercise are fair; he has never had pancreatitis, pancreatic surgery, or DKA.  Last episode of severe hypoglycemia was in 2013.  He takes 70/30 insulin (60 units qam and 55 units qpm), and metformin.  He says cbg's vary from [place].  It is in general higher as the day goes on.   Past Medical History:  Diagnosis Date  . BPH (benign prostatic hypertrophy)   . Cardiomyopathy, ischemic   . Coronary artery disease CARDIOLOGIST- DR Frederico Hamman  ([facility])--  LAST VISIT APRIL 2013--  REQUEST FOR RECORD FAXED   LAST CARDIAC CATH 2009 (GRAFTS PATENT)/ LAST STRESS TEST 2010-  PER PT OK  . Diabetes mellitus, type 2 ([place])   . Frequency of urination   . Gross hematuria   . History of CHF (congestive heart failure) 2007  . History of kidney stones   . History of myocardial infarction 2006 --  NON ST ELEVATED  MI  S/P CABG  , [facility]  . History of urinary retention   . Hyperlipidemia   . Nocturia   . Renal calculi BILATERAL PER CT SCAN--  NON-OBSTRUCTIVE  . S/P CABG x 4 2006  . Urgency of urination     Past Surgical History:  Procedure Laterality Date  . CARDIAC CATHETERIZATION  01-17-2008  DR SANTOS ([facility])   GRAFTS PATENT / EF 40%  . CORONARY ARTERY BYPASS GRAFT  AUG 2006   X4  VESSEL  . CYSTO / RIGHT URETERAL STENT PLACEMENT  10-08-1999  . CYSTO/  REMOVAL BLADDER STONES  01-14-2010  . CYSTOSCOPY  10/16/2012   Procedure: CYSTOSCOPY;  Surgeon: Malka So, MD;  Location: [facility];  Service: Urology;  Laterality: N/A;  Cystoscopy with Clot Evacuation and fulguration bladder neck.  Marland Kitchen EXTRACORPOREAL SHOCK WAVE LITHOTRIPSY  10-15-2009   RIGHT  . KNEE  ARTHROSCOPY  1978   RIGHT  . LEFT SHOULDER SURGERY   2002  . LUMBAR FUSION  09-10-2008   L4 - L5  . PERCUTANEOUS NEPHROSTOLITHOTOMY  1979  . PROSTATE BIOPSY  10/16/2012   Procedure: PROSTATE BIOPSY;  Surgeon: Malka So, MD;  Location: [facility];  Service: Urology;  Laterality: N/A;  Through ultrasound.  Marland Kitchen SHOULDER ARTHROSCOPY W/ SUBACROMIAL DECOMPRESSION AND DISTAL CLAVICLE EXCISION  10-11-2004   AND PARTIAL ROTATOR CUFF REPAIR  . TRANSTHORACIC ECHOCARDIOGRAM  01-09-2012  [facility]   LV SYSTOLIC FUNCTION MILDLY REDUCED/ EF 48%/ LV FILLING PATTERN  IS IMPAIRED/ HYPOKINESIS IN THE MID AND BASALAR SEPTUM & ANTEROSEPTUM  . TURP VAPORIZATION  01-19-2010   BPH W/ BOO  . URETEROLITHOTOMY  1976    Social History   Socioeconomic History  . Marital status: Married    Spouse name: Not on file  . Number of children: Not on file  . Years of education: Not on file  . Highest education level: Not on file  Social Needs  . Financial resource strain: Not on file  . Food insecurity - worry: Not on file  . Food insecurity - inability: Not on file  . Transportation needs - medical: Not on file  . Transportation needs -  non-medical: Not on file  Occupational History  . Not on file  Tobacco Use  . Smoking status: Never Smoker  . Smokeless tobacco: Never Used  Substance and Sexual Activity  . Alcohol use: No  . Drug use: No  . Sexual activity: Not on file  Other Topics Concern  . Not on file  Social History Narrative  . Not on file    Current Outpatient Medications on File Prior to Visit  Medication Sig Dispense Refill  . amLODipine (NORVASC) 5 MG tablet Take 1 tablet (5 mg total) by mouth daily. 90 tablet 3  . aspirin 81 MG tablet Take 81 mg by mouth daily.    Marland Kitchen atorvastatin (LIPITOR) 80 MG tablet Take 1 tablet (80 mg total) by mouth daily. 90 tablet 3  . BD VEO INSULIN SYR ULTRAFINE 31G X 15/64" 1 ML MISC use as directed TO INJECT INSULIN  twice a day 10 each 0  .  carvedilol (COREG) 6.25 MG tablet Take 1 tablet (6.25 mg total) by mouth daily. 90 tablet 3  . cyclobenzaprine (FLEXERIL) 5 MG tablet take 1 tablet by mouth three times a day if needed for muscle spasm 60 tablet 0  . finasteride (PROSCAR) 5 MG tablet Take 1 tablet (5 mg total) by mouth daily. 90 tablet 3  . lisinopril (PRINIVIL,ZESTRIL) 40 MG tablet Take 1 tablet (40 mg total) daily by mouth. 90 tablet 3  . metFORMIN (GLUCOPHAGE) 1000 MG tablet Take 1 tablet (1,000 mg total) by mouth 2 (two) times daily with a meal. 180 tablet 1  . omeprazole (PRILOSEC) 40 MG capsule Take 1 capsule (40 mg total) daily by mouth. 90 capsule 1  . sildenafil (REVATIO) 20 MG tablet Take 1 tablet (20 mg total) by mouth 3 (three) times daily. 90 tablet 2   No current facility-administered medications on file prior to visit.     No Known Allergies  Family History  Problem Relation Age of Onset  . Diabetes Father     BP (!) 160/72 (BP Location: Left Arm, Patient Position: Sitting, Cuff Size: Normal)   Pulse 71   Wt 257 lb 3.2 oz (116.7 kg)   SpO2 97%   BMI 34.88 kg/m    Review of Systems denies weight loss, blurry vision, headache, chest pain, sob, n/v, muscle cramps, excessive diaphoresis, memory loss, depression, cold intolerance, and rhinorrhea. he has easy bruising and frequent urination.     Objective:   Physical Exam VS: see vs page GEN: no distress HEAD: head: no deformity eyes: no periorbital swelling, no proptosis external nose and ears are normal mouth: no lesion seen NECK: supple, thyroid is not enlarged CHEST WALL: no deformity.  Old healed surgical scar (median sternotomy) LUNGS: clear to auscultation CV: reg rate and rhythm, no murmur ABD: abdomen is soft, nontender.  no hepatosplenomegaly.  not distended.  Small periumbilical hernia MUSCULOSKELETAL: muscle bulk and strength are grossly normal.  no obvious joint swelling.  gait is normal and steady EXTEMITIES: no deformity.  no ulcer  on the feet.  feet are of normal color and temp.  1+ bilat leg edema PULSES: dorsalis pedis intact bilat.  no carotid bruit NEURO:  cn 2-12 grossly intact.   readily moves all 4's.  sensation is intact to touch on the feet, but decreased from normal SKIN:  Normal texture and temperature.  No rash or suspicious lesion is visible.   NODES:  None palpable at the neck PSYCH: alert, well-oriented.  Does not appear anxious nor  depressed.  Lab Results  Component Value Date   HGBA1C 12.3 (H) 07/13/2017   Lab Results  Component Value Date   CREATININE 1.22 07/13/2017   BUN 18 07/13/2017   NA 136 07/13/2017   K 5.0 07/13/2017   CL 101 07/13/2017   CO2 31 07/13/2017   I have reviewed outside records, and summarized: Pt was noted to have severely elevated a1c, and referred here.  Other probs addressed were HTN, dyslipidemia, and GERD.  Insulin was increased.  I personally reviewed electrocardiogram tracing (10/16/12): Indication: preop Impression: NSR.  Anterior QS complexes.  No hypertrophy. No prior is available for comparison     Assessment & Plan:  Insulin-requiring type 2 DM, with CAD: severe exacerbation.  We discussed.  He declines multiple daily injections Renal insuff, new to me: in this setting, he needs less PM insulin.  Pattern of cbg's supports this.   Patient Instructions  good diet and exercise significantly improve the control of your diabetes.  please let me know if you wish to be referred to a dietician.  high blood sugar is very risky to your health.  you should see an eye doctor and dentist every year.  It is very important to get all recommended vaccinations.  Controlling your blood pressure and cholesterol drastically reduces the damage diabetes does to your body.  Those who smoke should quit.  Please discuss these with your doctor.  check your blood sugar twice a day.  vary the time of day when you check, between before the 3 meals, and at bedtime.  also check if you  have symptoms of your blood sugar being too high or too low.  please keep a record of the readings and bring it to your next appointment here (or you can bring the meter itself).  You can write it on any piece of paper.  please call us sooner if your blood sugar goes below 70, or if you have a lot of readings over 200.   For now, please: Increase the insulin to 80 units with breakfast, and 55 units with supper.   Please continue the same metformin.   Please come back for a follow-up appointment in 1 month.  Please call or message [place] next week, to tell us how the blood sugar is doing.

## 2017-08-26 DIAGNOSIS — E119 Type 2 diabetes mellitus without complications: Secondary | ICD-10-CM | POA: Insufficient documentation

## 2017-08-26 DIAGNOSIS — E1140 Type 2 diabetes mellitus with diabetic neuropathy, unspecified: Secondary | ICD-10-CM | POA: Insufficient documentation

## 2017-08-26 DIAGNOSIS — Z794 Long term (current) use of insulin: Principal | ICD-10-CM

## 2017-08-26 DIAGNOSIS — N183 Chronic kidney disease, stage 3 (moderate): Principal | ICD-10-CM

## 2017-08-26 DIAGNOSIS — E1122 Type 2 diabetes mellitus with diabetic chronic kidney disease: Secondary | ICD-10-CM | POA: Insufficient documentation

## 2017-10-02 ENCOUNTER — Ambulatory Visit: Payer: Medicare Other | Admitting: Endocrinology

## 2017-10-17 ENCOUNTER — Telehealth: Payer: Self-pay | Admitting: Internal Medicine

## 2017-10-17 DIAGNOSIS — J40 Bronchitis, not specified as acute or chronic: Secondary | ICD-10-CM | POA: Diagnosis not present

## 2017-10-17 NOTE — Telephone Encounter (Signed)
Erroneous phone encounter

## 2017-10-23 ENCOUNTER — Ambulatory Visit: Payer: Medicare Other | Admitting: Endocrinology

## 2017-11-03 ENCOUNTER — Ambulatory Visit: Payer: Medicare Other | Admitting: Endocrinology

## 2017-11-03 ENCOUNTER — Encounter: Payer: Self-pay | Admitting: Endocrinology

## 2017-11-03 VITALS — BP 210/92 | HR 79 | Wt 262.6 lb

## 2017-11-03 DIAGNOSIS — E1122 Type 2 diabetes mellitus with diabetic chronic kidney disease: Secondary | ICD-10-CM

## 2017-11-03 DIAGNOSIS — N183 Chronic kidney disease, stage 3 (moderate): Secondary | ICD-10-CM | POA: Diagnosis not present

## 2017-11-03 DIAGNOSIS — Z794 Long term (current) use of insulin: Secondary | ICD-10-CM

## 2017-11-03 LAB — POCT GLYCOSYLATED HEMOGLOBIN (HGB A1C): Hemoglobin A1C: 8.8

## 2017-11-03 MED ORDER — INSULIN NPH ISOPHANE & REGULAR (70-30) 100 UNIT/ML ~~LOC~~ SUSP: SUBCUTANEOUS | 3 refills | Status: DC

## 2017-11-03 NOTE — Patient Instructions (Addendum)
check your blood sugar twice a day.  vary the time of day when you check, between before the 3 meals, and at bedtime.  also check if you have symptoms of your blood sugar being too high or too low.  please keep a record of the readings and bring it to your next appointment here (or you can bring the meter itself).  You can write it on any piece of paper.  please call us sooner if your blood sugar goes below 70, or if you have a lot of readings over 200.   For now, please: Increase the insulin to 90 units with breakfast, and 55 units with supper.   On this type of insulin schedule, you should eat meals on a regular schedule.  If a meal is missed or significantly delayed, your blood sugar could go low. Please continue the same metformin.   Please come back for a follow-up appointment in 2 months.

## 2017-11-03 NOTE — Progress Notes (Signed)
Subjective:    Patient ID: Russell Trevino, male    DOB: [birth year], 67 y.o.   MRN: [id number]  HPI Pt returns for f/u of diabetes mellitus: DM type: Insulin-requiring type 2 Dx'ed: 3151 Complications: polyneuropathy, DR, renal insuff, and CAD.  Therapy: insulin since DKA: never Severe hypoglycemia: last episode was 2013 Pancreatitis: never Pancreatic imaging: normal on 2010 CT Other: he declines multiple daily injections Interval history: no cbg record, but states cbg's vary from 128-210.  It is in general higher as the day goes on.  He feels better, since recent resp infection.  He seldom has hypoglycemia--this happens when he misses lunch Past Medical History:  Diagnosis Date  . BPH (benign prostatic hypertrophy)   . Cardiomyopathy, ischemic   . Coronary artery disease CARDIOLOGIST- DR Frederico Hamman  ([facility])--  LAST VISIT APRIL 2013--  REQUEST FOR RECORD FAXED   LAST CARDIAC CATH 2009 (GRAFTS PATENT)/ LAST STRESS TEST 2010-  PER PT OK  . Diabetes mellitus, type 2 ([place])   . Frequency of urination   . Gross hematuria   . History of CHF (congestive heart failure) 2007  . History of kidney stones   . History of myocardial infarction 2006 --  NON ST ELEVATED  MI  S/P CABG  , [facility]  . History of urinary retention   . Hyperlipidemia   . Nocturia   . Renal calculi BILATERAL PER CT SCAN--  NON-OBSTRUCTIVE  . S/P CABG x 4 2006  . Urgency of urination     Past Surgical History:  Procedure Laterality Date  . CARDIAC CATHETERIZATION  01-17-2008  DR SANTOS ([facility])   GRAFTS PATENT / EF 40%  . CORONARY ARTERY BYPASS GRAFT  AUG 2006   X4  VESSEL  . CYSTO / RIGHT URETERAL STENT PLACEMENT  10-08-1999  . CYSTO/  REMOVAL BLADDER STONES  01-14-2010  . CYSTOSCOPY  10/16/2012   Procedure: CYSTOSCOPY;  Surgeon: Malka So, MD;  Location: [facility];  Service: Urology;  Laterality: N/A;  Cystoscopy with Clot Evacuation and fulguration bladder neck.  Marland Kitchen EXTRACORPOREAL SHOCK  WAVE LITHOTRIPSY  10-15-2009   RIGHT  . KNEE ARTHROSCOPY  1978   RIGHT  . LEFT SHOULDER SURGERY   2002  . LUMBAR FUSION  09-10-2008   L4 - L5  . PERCUTANEOUS NEPHROSTOLITHOTOMY  1979  . PROSTATE BIOPSY  10/16/2012   Procedure: PROSTATE BIOPSY;  Surgeon: Malka So, MD;  Location: [facility];  Service: Urology;  Laterality: N/A;  Through ultrasound.  Marland Kitchen SHOULDER ARTHROSCOPY W/ SUBACROMIAL DECOMPRESSION AND DISTAL CLAVICLE EXCISION  10-11-2004   AND PARTIAL ROTATOR CUFF REPAIR  . TRANSTHORACIC ECHOCARDIOGRAM  01-09-2012  [facility]   LV SYSTOLIC FUNCTION MILDLY REDUCED/ EF 48%/ LV FILLING PATTERN  IS IMPAIRED/ HYPOKINESIS IN THE MID AND BASALAR SEPTUM & ANTEROSEPTUM  . TURP VAPORIZATION  01-19-2010   BPH W/ BOO  . URETEROLITHOTOMY  1976    Social History   Socioeconomic History  . Marital status: Married    Spouse name: Not on file  . Number of children: Not on file  . Years of education: Not on file  . Highest education level: Not on file  Social Needs  . Financial resource strain: Not on file  . Food insecurity - worry: Not on file  . Food insecurity - inability: Not on file  . Transportation needs - medical: Not on file  . Transportation needs - non-medical: Not on file  Occupational History  . Not on  file  Tobacco Use  . Smoking status: Never Smoker  . Smokeless tobacco: Never Used  Substance and Sexual Activity  . Alcohol use: No  . Drug use: No  . Sexual activity: Not on file  Other Topics Concern  . Not on file  Social History Narrative  . Not on file    Current Outpatient Medications on File Prior to Visit  Medication Sig Dispense Refill  . amLODipine (NORVASC) 5 MG tablet Take 1 tablet (5 mg total) by mouth daily. 90 tablet 3  . aspirin 81 MG tablet Take 81 mg by mouth daily.    Marland Kitchen atorvastatin (LIPITOR) 80 MG tablet Take 1 tablet (80 mg total) by mouth daily. 90 tablet 3  . BD VEO INSULIN SYR ULTRAFINE 31G X 15/64" 1 ML MISC use as directed TO  INJECT INSULIN  twice a day 10 each 0  . carvedilol (COREG) 6.25 MG tablet Take 1 tablet (6.25 mg total) by mouth daily. 90 tablet 3  . cyclobenzaprine (FLEXERIL) 5 MG tablet take 1 tablet by mouth three times a day if needed for muscle spasm 60 tablet 0  . finasteride (PROSCAR) 5 MG tablet Take 1 tablet (5 mg total) by mouth daily. 90 tablet 3  . lisinopril (PRINIVIL,ZESTRIL) 40 MG tablet Take 1 tablet (40 mg total) daily by mouth. 90 tablet 3  . metFORMIN (GLUCOPHAGE) 1000 MG tablet Take 1 tablet (1,000 mg total) by mouth 2 (two) times daily with a meal. 180 tablet 1  . omeprazole (PRILOSEC) 40 MG capsule Take 1 capsule (40 mg total) daily by mouth. 90 capsule 1  . sildenafil (REVATIO) 20 MG tablet Take 1 tablet (20 mg total) by mouth 3 (three) times daily. 90 tablet 2   No current facility-administered medications on file prior to visit.     No Known Allergies  Family History  Problem Relation Age of Onset  . Diabetes Father     BP (!) 210/92 (BP Location: Left Arm, Patient Position: Sitting, Cuff Size: Normal)   Pulse 79   Wt 262 lb 9.6 oz (119.1 kg)   SpO2 98%   BMI 35.61 kg/m   Review of Systems He denies LOC    Objective:   Physical Exam VITAL SIGNS:  See vs page GENERAL: no distress Pulses: dorsalis pedis intact bilat.   MSK: no deformity of the feet CV: trace bilat leg edema Skin:  no ulcer on the feet.  normal color and temp on the feet. Neuro: sensation is intact to touch on the feet, but decreased from normal     A1c=8.8%     Assessment & Plan:  Insulin-requiring type 2 DM, with renal insuff.   Patient Instructions  check your blood sugar twice a day.  vary the time of day when you check, between before the 3 meals, and at bedtime.  also check if you have symptoms of your blood sugar being too high or too low.  please keep a record of the readings and bring it to your next appointment here (or you can bring the meter itself).  You can write it on any piece  of paper.  please call us sooner if your blood sugar goes below 70, or if you have a lot of readings over 200.   For now, please: Increase the insulin to 90 units with breakfast, and 55 units with supper.   On this type of insulin schedule, you should eat meals on a regular schedule.  If a meal is  missed or significantly delayed, your blood sugar could go low. Please continue the same metformin.   Please come back for a follow-up appointment in 2 months.

## 2017-11-16 DIAGNOSIS — Z79899 Other long term (current) drug therapy: Secondary | ICD-10-CM | POA: Diagnosis not present

## 2017-11-16 DIAGNOSIS — R0601 Orthopnea: Secondary | ICD-10-CM | POA: Diagnosis not present

## 2017-11-16 DIAGNOSIS — Z951 Presence of aortocoronary bypass graft: Secondary | ICD-10-CM | POA: Diagnosis not present

## 2017-11-16 DIAGNOSIS — R600 Localized edema: Secondary | ICD-10-CM | POA: Diagnosis not present

## 2017-11-16 DIAGNOSIS — E8770 Fluid overload, unspecified: Secondary | ICD-10-CM | POA: Diagnosis not present

## 2017-11-16 DIAGNOSIS — I2510 Atherosclerotic heart disease of native coronary artery without angina pectoris: Secondary | ICD-10-CM | POA: Diagnosis not present

## 2017-11-16 DIAGNOSIS — I10 Essential (primary) hypertension: Secondary | ICD-10-CM | POA: Diagnosis not present

## 2017-11-16 DIAGNOSIS — E119 Type 2 diabetes mellitus without complications: Secondary | ICD-10-CM | POA: Diagnosis not present

## 2017-11-16 DIAGNOSIS — I255 Ischemic cardiomyopathy: Secondary | ICD-10-CM | POA: Diagnosis not present

## 2018-01-02 ENCOUNTER — Ambulatory Visit: Payer: Self-pay | Admitting: *Deleted

## 2018-01-02 NOTE — Telephone Encounter (Signed)
He called in from out of town c/o having shortness of breath intermittently for 2-3 weeks or longer now.  He is also experiencing wheezing that is new for him.   He has an appt with his cardiologist on 01/04/18 at 11:00 and also getting a 2-D echocardiogram done too at [facility].    He had a CABG 2006.  He is requesting an appt for Thursday or Friday while he is in town if possible with his PCP. See triage notes.  Per protocol he needs to be seen within 4 hours.   He wants to wait until seeing his doctors here and his cardiologist on Thursday.   He is not in distress.     Dr. Quay Burow is not available this week so I made him an appt with Dr. Sharlet Salina for 01/04/18 at 8:15am.   This way he can make his 11:00 am appt in [facility]   Reason for Disposition . [1] MILD difficulty breathing (e.g., minimal/no SOB at rest, SOB with walking, pulse <100) AND [2] NEW-onset or WORSE than normal  Answer Assessment - Initial Assessment Questions 1. RESPIRATORY STATUS: "Describe your breathing?" (e.g., wheezing, shortness of breath, unable to speak, severe coughing)      Been short of breath on and off for 2-3 weeks.   I wheeze a lot lately.  I don't normally wheeze.   I've been taking decongestants without any relief.   I see a heart doctor in [facility].   I have a yearly appt on Thurs. At 11:00 with heart doctor.   2. ONSET: "When did this breathing problem begin?"      2-3 weeks.   Probably 4-5 weeks.  3. PATTERN "Does the difficult breathing come and go, or has it been constant since it started?"      It comes and goes. 4. SEVERITY: "How bad is your breathing?" (e.g., mild, moderate, severe)    - MILD: No SOB at rest, mild SOB with walking, speaks normally in sentences, can lay down, no retractions, pulse < 100.    - MODERATE: SOB at rest, SOB with minimal exertion and prefers to sit, cannot lie down flat, speaks in phrases, mild retractions, audible wheezing, pulse 100-120.   - SEVERE: Very SOB at rest, speaks in single words, struggling to breathe, sitting hunched forward, retractions, pulse > 120      Just short of breath walking,  Going up steps a lot quicker.   5. RECURRENT SYMPTOM: "Have you had difficulty breathing before?" If so, ask: "When was the last time?" and "What happened that time?"      No 6. CARDIAC HISTORY: "Do you have any history of heart disease?" (e.g., heart attack, angina, bypass surgery, angioplasty)      I had a CABG in 2006.   This Thursday I'm for 2-D echocardiogram. 7. LUNG HISTORY: "Do you have any history of lung disease?"  (e.g., pulmonary embolus, asthma, emphysema)     No lung history.   No blood clots.   My feet have been swelling too. 8. CAUSE: "What do you think is causing the breathing problem?"      I really don't know.   9. OTHER SYMPTOMS: "Do you have any other symptoms? (e.g., dizziness, runny nose, cough, chest pain, fever)     None of the above 10. PREGNANCY: "Is there any chance you are pregnant?" "When was your last menstrual period?"       N/A 11. TRAVEL: "Have you traveled  out of the country in the last month?" (e.g., travel history, exposures)       No  Protocols used: BREATHING DIFFICULTY-A-AH

## 2018-01-02 NOTE — Telephone Encounter (Signed)
noted 

## 2018-01-04 ENCOUNTER — Telehealth: Payer: Self-pay

## 2018-01-04 ENCOUNTER — Encounter: Payer: Self-pay | Admitting: Internal Medicine

## 2018-01-04 ENCOUNTER — Ambulatory Visit (INDEPENDENT_AMBULATORY_CARE_PROVIDER_SITE_OTHER)
Admission: RE | Admit: 2018-01-04 | Discharge: 2018-01-04 | Disposition: A | Discharge status: Home / Self Care | Payer: Medicare Other | Source: Ambulatory Visit | Attending: Internal Medicine | Admitting: Internal Medicine

## 2018-01-04 ENCOUNTER — Other Ambulatory Visit (INDEPENDENT_AMBULATORY_CARE_PROVIDER_SITE_OTHER): Payer: Medicare Other

## 2018-01-04 ENCOUNTER — Ambulatory Visit: Payer: Medicare Other | Admitting: Internal Medicine

## 2018-01-04 VITALS — BP 120/60 | HR 76 | Temp 98.7°F | Ht 72.0 in | Wt 262.0 lb

## 2018-01-04 DIAGNOSIS — I255 Ischemic cardiomyopathy: Secondary | ICD-10-CM | POA: Diagnosis not present

## 2018-01-04 DIAGNOSIS — I110 Hypertensive heart disease with heart failure: Secondary | ICD-10-CM | POA: Diagnosis not present

## 2018-01-04 DIAGNOSIS — R918 Other nonspecific abnormal finding of lung field: Secondary | ICD-10-CM | POA: Diagnosis not present

## 2018-01-04 DIAGNOSIS — I517 Cardiomegaly: Secondary | ICD-10-CM | POA: Diagnosis not present

## 2018-01-04 DIAGNOSIS — R079 Chest pain, unspecified: Secondary | ICD-10-CM

## 2018-01-04 DIAGNOSIS — J90 Pleural effusion, not elsewhere classified: Secondary | ICD-10-CM | POA: Diagnosis not present

## 2018-01-04 DIAGNOSIS — E119 Type 2 diabetes mellitus without complications: Secondary | ICD-10-CM | POA: Diagnosis not present

## 2018-01-04 DIAGNOSIS — I5033 Acute on chronic diastolic (congestive) heart failure: Secondary | ICD-10-CM | POA: Diagnosis not present

## 2018-01-04 DIAGNOSIS — R5383 Other fatigue: Secondary | ICD-10-CM | POA: Diagnosis not present

## 2018-01-04 DIAGNOSIS — I214 Non-ST elevation (NSTEMI) myocardial infarction: Secondary | ICD-10-CM | POA: Diagnosis not present

## 2018-01-04 DIAGNOSIS — R7989 Other specified abnormal findings of blood chemistry: Secondary | ICD-10-CM | POA: Diagnosis not present

## 2018-01-04 DIAGNOSIS — E65 Localized adiposity: Secondary | ICD-10-CM | POA: Diagnosis not present

## 2018-01-04 DIAGNOSIS — R0602 Shortness of breath: Secondary | ICD-10-CM | POA: Diagnosis not present

## 2018-01-04 DIAGNOSIS — J810 Acute pulmonary edema: Secondary | ICD-10-CM | POA: Diagnosis not present

## 2018-01-04 DIAGNOSIS — Z951 Presence of aortocoronary bypass graft: Secondary | ICD-10-CM | POA: Diagnosis not present

## 2018-01-04 DIAGNOSIS — R0600 Dyspnea, unspecified: Secondary | ICD-10-CM | POA: Diagnosis not present

## 2018-01-04 DIAGNOSIS — I2582 Chronic total occlusion of coronary artery: Secondary | ICD-10-CM | POA: Diagnosis not present

## 2018-01-04 DIAGNOSIS — I2510 Atherosclerotic heart disease of native coronary artery without angina pectoris: Secondary | ICD-10-CM | POA: Diagnosis not present

## 2018-01-04 DIAGNOSIS — I25119 Atherosclerotic heart disease of native coronary artery with unspecified angina pectoris: Secondary | ICD-10-CM | POA: Diagnosis not present

## 2018-01-04 DIAGNOSIS — I25719 Atherosclerosis of autologous vein coronary artery bypass graft(s) with unspecified angina pectoris: Secondary | ICD-10-CM | POA: Diagnosis not present

## 2018-01-04 DIAGNOSIS — E785 Hyperlipidemia, unspecified: Secondary | ICD-10-CM | POA: Diagnosis not present

## 2018-01-04 DIAGNOSIS — I10 Essential (primary) hypertension: Secondary | ICD-10-CM | POA: Diagnosis not present

## 2018-01-04 LAB — COMPREHENSIVE METABOLIC PANEL
ALBUMIN: 3.4 g/dL — AB (ref 3.5–5.2)
ALT: 18 U/L (ref 0–53)
AST: 16 U/L (ref 0–37)
Alkaline Phosphatase: 82 U/L (ref 39–117)
BUN: 22 mg/dL (ref 6–23)
CALCIUM: 9.2 mg/dL (ref 8.4–10.5)
CHLORIDE: 106 meq/L (ref 96–112)
CO2: 27 mEq/L (ref 19–32)
CREATININE: 1.07 mg/dL (ref 0.40–1.50)
GFR: 73.15 mL/min (ref >60.00)
Glucose, Bld: 87 mg/dL (ref 70–99)
Potassium: 4.1 mEq/L (ref 3.5–5.1)
Sodium: 141 mEq/L (ref 135–145)
Total Bilirubin: 1 mg/dL (ref 0.2–1.2)
Total Protein: 6.5 g/dL (ref 6.0–8.3)

## 2018-01-04 LAB — CBC
HCT: 38.8 % — ABNORMAL LOW (ref 39.0–52.0)
HEMOGLOBIN: 13.3 g/dL (ref 13.0–17.0)
MCHC: 34.3 g/dL (ref 30.0–36.0)
MCV: 87.2 fl (ref 78.0–100.0)
Platelets: 200 10*3/uL (ref 150.0–400.0)
RBC: 4.45 Mil/uL (ref 4.22–5.81)
RDW: 14.2 % (ref 11.5–15.5)
WBC: 8 10*3/uL (ref 4.0–10.5)

## 2018-01-04 LAB — BRAIN NATRIURETIC PEPTIDE: Pro B Natriuretic peptide (BNP): 398 pg/mL — ABNORMAL HIGH (ref 0.0–100.0)

## 2018-01-04 LAB — TROPONIN I: TNIDX: 0.18 ug/L — AB (ref 0.00–0.06)

## 2018-01-04 MED ORDER — FUROSEMIDE 40 MG PO TABS: 40.0000 mg | ORAL_TABLET | Freq: Two times a day (BID) | ORAL | 0 refills | Status: DC

## 2018-01-04 NOTE — Progress Notes (Signed)
   Subjective:    Patient ID: Russell Trevino, male    DOB: 15-Dec-1950, 67 y.o.   MRN: [id number]  HPI The patient is a 67 YO man coming in for 2-3 weeks of SOB on exertion and chest pains on exertion. He normally is not SOB with activity. Has to stop with climbing stairs to rest at the top. He has been doing some work restoring a new property in [employer]. Tries to adequately ventilate and cannot recall being exposed to fumes. Gradually worsening over the 2-3 weeks. Is also having new swelling in his legs which is not usual. Does have concurrent diabetes and denies change in blood sugar lately and they are fairly controlled. He has a visit with cardiology later today at [facility]. Does have mild cough in the morning which is productive of clear mucus. Denies sinus symptoms.   Review of Systems  Constitutional: Positive for activity change. Negative for appetite change, diaphoresis, fatigue and unexpected weight change.  HENT: Negative.   Eyes: Negative.   Respiratory: Positive for shortness of breath. Negative for cough and chest tightness.   Cardiovascular: Positive for chest pain and leg swelling. Negative for palpitations.  Gastrointestinal: Negative for abdominal distention, abdominal pain, constipation, diarrhea, nausea and vomiting.  Musculoskeletal: Negative.   Skin: Negative.   Neurological: Negative.   Psychiatric/Behavioral: Negative.       Objective:   Physical Exam  Constitutional: He is oriented to person, place, and time. He appears well-developed and well-nourished.  HENT:  Head: Normocephalic and atraumatic.  Eyes: EOM are normal.  Neck: Normal range of motion.  Cardiovascular: Normal rate and regular rhythm.  Pulmonary/Chest: Effort normal and breath sounds normal. No respiratory distress. He has no wheezes. He has no rales.  Abdominal: Soft. Bowel sounds are normal. He exhibits no distension. There is no tenderness. There is no rebound.  Musculoskeletal:       Right lower  leg: He exhibits edema.       Left lower leg: He exhibits edema.  2+ pitting edema bilaterally to the knees  Neurological: He is alert and oriented to person, place, and time. Coordination normal.  Skin: Skin is warm and dry.  Psychiatric: He has a normal mood and affect.   Vitals:   01/04/18 0813  BP: 120/60  Pulse: 76  Temp: 98.7 F (37.1 C)  TempSrc: Oral  SpO2: 95%  Weight: 262 lb (118.8 kg)  Height: 6' (1.829 m)   EKG: Rate 67, axis normal, interval normal, sinus, some stable st changes, no change from prior 2014 significant    Assessment & Plan:

## 2018-01-04 NOTE — Patient Instructions (Signed)
The EKG of the heart is not changed from before.  We are checking the blood work and the chest x-ray today.  We have sent in furosemide (lasix) to take 1 pill twice a day for the next 1 week. Call back if no improvement.   We have given you a copy of the EKG today to show the heart doctor and you can tell them to look in care everywhere to see the lab results from this morning.

## 2018-01-04 NOTE — Telephone Encounter (Signed)
Patient informed of results and was told to leave his appointment he was at and go to ER. Patient stated awareness

## 2018-01-04 NOTE — Assessment & Plan Note (Addendum)
New serious concern. Given history there is possibility of new heart attack. EKG without changes. Checking troponin, BNP, CXR. Has some appointment with cardiology today so given EKG copy to take with him (could just be an echo he is not sure). Rx for lasix 40 mg BID for edema on exam which is likely related to SOB.

## 2018-01-04 NOTE — Telephone Encounter (Signed)
CRITICAL VALUE STICKER  CRITICAL VALUE: Troponin 0.18  RECEIVER (on-site recipient of call): [place] NOTIFIED: 10:39  MESSENGER (representative from lab): Santiago Glad  MD NOTIFIED: Yes  TIME OF NOTIFICATION: 10:40  RESPONSE:

## 2018-01-04 NOTE — Telephone Encounter (Signed)
Please call patient and let him know that labs indicate he could be having new heart attack symptoms and we would like him to go to ER now.

## 2018-01-08 ENCOUNTER — Ambulatory Visit: Payer: Medicare Other | Admitting: Endocrinology

## 2018-01-08 DIAGNOSIS — Z0289 Encounter for other administrative examinations: Secondary | ICD-10-CM

## 2018-04-12 DIAGNOSIS — E083313 Diabetes mellitus due to underlying condition with moderate nonproliferative diabetic retinopathy with macular edema, bilateral: Secondary | ICD-10-CM | POA: Diagnosis not present

## 2018-04-12 DIAGNOSIS — H2513 Age-related nuclear cataract, bilateral: Secondary | ICD-10-CM | POA: Diagnosis not present

## 2018-04-12 DIAGNOSIS — H40013 Open angle with borderline findings, low risk, bilateral: Secondary | ICD-10-CM | POA: Diagnosis not present

## 2018-04-12 LAB — HM DIABETES EYE EXAM

## 2018-04-26 ENCOUNTER — Encounter: Payer: Self-pay | Admitting: Internal Medicine

## 2018-05-31 DIAGNOSIS — B3742 Candidal balanitis: Secondary | ICD-10-CM | POA: Diagnosis not present

## 2018-05-31 DIAGNOSIS — E119 Type 2 diabetes mellitus without complications: Secondary | ICD-10-CM | POA: Diagnosis not present

## 2018-05-31 DIAGNOSIS — R208 Other disturbances of skin sensation: Secondary | ICD-10-CM | POA: Diagnosis not present

## 2018-06-19 NOTE — Progress Notes (Signed)
Subjective:    Patient ID: Russell Trevino, male    DOB: 1950-12-18, 67 y.o.   MRN: [id number]  HPI The patient is here for an acute visit.   Right buttock pain:  It started at least one week ago.  He did go to a ball game and slipped and sat down hard on the bleachers.  The pain started two days later - he is unsure if it is related or not.  He denies other falls or injures.  It bothers him most when he lays and sits.  He has less pain with standing and walking.  The pain is located in the right buttock region and does not radiate.  He denies numbness/tingling/weakness or pain in the leg.  He denies lower back pain. He denies similar in the past.  He has tried tylenol Pm at night and tylenol during the day - it helps minimally.  His pain can reach a 8/10.    Left leg swelling:  His left leg is swollen most of the time.  He elevates it at night, which helps.  He has minimal swelling in the left leg.  He was on lasix in the past and it helped.  He is not compliant with a low sodium diet -  he eats out all the time.  He wondered if he could restart the Lasix.  He also wondered if there is anything else he could do.  Diabetes: He has not followed up with Dr. Loanne Drilling for his sugars.  He states they are likely high.  He has not been as compliant with a diabetic diet.  He has not been monitoring his sugars regularly.    Medications and allergies reviewed with patient and updated if appropriate.  Patient Active Problem List   Diagnosis Date Noted  . Leg swelling 06/20/2018  . Chest pain on exertion 01/04/2018  . Diabetes ([place]) 08/26/2017  . GERD (gastroesophageal reflux disease) 07/13/2017  . Cough 08/17/2016  . Thoracic back pain 06/21/2016  . Hx of CABG 05/26/2016  . Prostate cancer ([place]) 05/26/2016  . Essential hypertension, benign 05/26/2016  . Hyperlipidemia 05/26/2016  . Cardiomyopathy ([place]) 03/25/2013  . Chronic coronary artery disease 03/25/2013  . Left ventricular systolic  dysfunction 62/22/9798  . MI (myocardial infarction) ([place]) 01/09/2012    Current Outpatient Medications on File Prior to Visit  Medication Sig Dispense Refill  . amLODipine (NORVASC) 5 MG tablet Take 1 tablet (5 mg total) by mouth daily. 90 tablet 3  . aspirin 81 MG tablet Take 81 mg by mouth daily.    Marland Kitchen atorvastatin (LIPITOR) 80 MG tablet Take 1 tablet (80 mg total) by mouth daily. 90 tablet 3  . BD VEO INSULIN SYR ULTRAFINE 31G X 15/64" 1 ML MISC use as directed TO INJECT INSULIN  twice a day 10 each 0  . carvedilol (COREG) 6.25 MG tablet Take 1 tablet (6.25 mg total) by mouth daily. 90 tablet 3  . finasteride (PROSCAR) 5 MG tablet Take 1 tablet (5 mg total) by mouth daily. 90 tablet 3  . insulin NPH-regular Human (HUMULIN 70/30) (70-30) 100 UNIT/ML injection 90 units with breakfast, and 55 units with supper 50 mL 3  . lisinopril (PRINIVIL,ZESTRIL) 40 MG tablet Take 1 tablet (40 mg total) daily by mouth. 90 tablet 3  . metFORMIN (GLUCOPHAGE) 1000 MG tablet Take 1 tablet (1,000 mg total) by mouth 2 (two) times daily with a meal. 180 tablet 1  . omeprazole (PRILOSEC) 40 MG capsule  Take 1 capsule (40 mg total) daily by mouth. 90 capsule 1  . sildenafil (REVATIO) 20 MG tablet Take 1 tablet (20 mg total) by mouth 3 (three) times daily. 90 tablet 2  . BRILINTA 90 MG TABS tablet   10   No current facility-administered medications on file prior to visit.     Past Medical History:  Diagnosis Date  . BPH (benign prostatic hypertrophy)   . Cardiomyopathy, ischemic   . Coronary artery disease CARDIOLOGIST- DR Frederico Hamman  ([facility])--  LAST VISIT APRIL 2013--  REQUEST FOR RECORD FAXED   LAST CARDIAC CATH 2009 (GRAFTS PATENT)/ LAST STRESS TEST 2010-  PER PT OK  . Diabetes mellitus, type 2 ([place])   . Frequency of urination   . Gross hematuria   . History of CHF (congestive heart failure) 2007  . History of kidney stones   . History of myocardial infarction 2006 --  NON ST ELEVATED  MI  S/P CABG  ,  [facility]  . History of urinary retention   . Hyperlipidemia   . Nocturia   . Renal calculi BILATERAL PER CT SCAN--  NON-OBSTRUCTIVE  . S/P CABG x 4 2006  . Urgency of urination     Past Surgical History:  Procedure Laterality Date  . CARDIAC CATHETERIZATION  01-17-2008  DR SANTOS ([facility])   GRAFTS PATENT / EF 40%  . CORONARY ARTERY BYPASS GRAFT  AUG 2006   X4  VESSEL  . CYSTO / RIGHT URETERAL STENT PLACEMENT  10-08-1999  . CYSTO/  REMOVAL BLADDER STONES  01-14-2010  . CYSTOSCOPY  10/16/2012   Procedure: CYSTOSCOPY;  Surgeon: Malka So, MD;  Location: [facility];  Service: Urology;  Laterality: N/A;  Cystoscopy with Clot Evacuation and fulguration bladder neck.  Marland Kitchen EXTRACORPOREAL SHOCK WAVE LITHOTRIPSY  10-15-2009   RIGHT  . KNEE ARTHROSCOPY  1978   RIGHT  . LEFT SHOULDER SURGERY   2002  . LUMBAR FUSION  09-10-2008   L4 - L5  . PERCUTANEOUS NEPHROSTOLITHOTOMY  1979  . PROSTATE BIOPSY  10/16/2012   Procedure: PROSTATE BIOPSY;  Surgeon: Malka So, MD;  Location: [facility];  Service: Urology;  Laterality: N/A;  Through ultrasound.  Marland Kitchen SHOULDER ARTHROSCOPY W/ SUBACROMIAL DECOMPRESSION AND DISTAL CLAVICLE EXCISION  10-11-2004   AND PARTIAL ROTATOR CUFF REPAIR  . TRANSTHORACIC ECHOCARDIOGRAM  01-09-2012  [facility]   LV SYSTOLIC FUNCTION MILDLY REDUCED/ EF 48%/ LV FILLING PATTERN  IS IMPAIRED/ HYPOKINESIS IN THE MID AND BASALAR SEPTUM & ANTEROSEPTUM  . TURP VAPORIZATION  01-19-2010   BPH W/ BOO  . URETEROLITHOTOMY  1976    Social History   Socioeconomic History  . Marital status: Married    Spouse name: Not on file  . Number of children: Not on file  . Years of education: Not on file  . Highest education level: Not on file  Occupational History  . Not on file  Social Needs  . Financial resource strain: Not on file  . Food insecurity:    Worry: Not on file    Inability: Not on file  . Transportation needs:    Medical: Not on file     Non-medical: Not on file  Tobacco Use  . Smoking status: Never Smoker  . Smokeless tobacco: Never Used  Substance and Sexual Activity  . Alcohol use: No  . Drug use: No  . Sexual activity: Not on file  Lifestyle  . Physical activity:    Days per week: Not  on file    Minutes per session: Not on file  . Stress: Not on file  Relationships  . Social connections:    Talks on phone: Not on file    Gets together: Not on file    Attends religious service: Not on file    Active member of club or organization: Not on file    Attends meetings of clubs or organizations: Not on file    Relationship status: Not on file  Other Topics Concern  . Not on file  Social History Narrative  . Not on file    Family History  Problem Relation Age of Onset  . Diabetes Father     Review of Systems  Constitutional: Negative for chills and fever.  Respiratory: Positive for cough (allergies related) and wheezing (allergies related). Negative for shortness of breath.   Cardiovascular: Positive for leg swelling. Negative for chest pain and palpitations.  Skin: Negative for color change.  Neurological: Negative for light-headedness and headaches.       Objective:   Vitals:   06/20/18 0856  BP: (!) 152/72  Pulse: 71  Resp: 16  Temp: 98.4 F (36.9 C)  SpO2: 97%   BP Readings from Last 3 Encounters:  06/20/18 (!) 152/72  01/04/18 120/60  11/03/17 (!) 210/92   Wt Readings from Last 3 Encounters:  06/20/18 265 lb (120.2 kg)  01/04/18 262 lb (118.8 kg)  11/03/17 262 lb 9.6 oz (119.1 kg)   Body mass index is 35.94 kg/m.   Physical Exam  Constitutional: He appears well-developed and well-nourished. No distress.  Cardiovascular: Normal rate and regular rhythm.  No murmur heard. Mild pitting right lower extremity <mild pitting left lower extremity.  Left lower extremity appears larger than right lower extremity  Pulmonary/Chest: Effort normal. No respiratory distress. He has no wheezes. He  has no rales.  Musculoskeletal:  No lower back pain or lateral right hip pain with palpation, right buttock pain with palpation.  Bilateral calf muscles nontender  Neurological:  Normal sensation and strength bilateral lower extremities  Skin: Skin is warm and dry. No rash noted. He is not diaphoretic. No erythema.           Assessment & Plan:    See Problem List for Assessment and Plan of chronic medical problems.

## 2018-06-20 ENCOUNTER — Ambulatory Visit: Payer: Medicare Other | Admitting: Internal Medicine

## 2018-06-20 ENCOUNTER — Encounter: Payer: Self-pay | Admitting: Internal Medicine

## 2018-06-20 ENCOUNTER — Other Ambulatory Visit (INDEPENDENT_AMBULATORY_CARE_PROVIDER_SITE_OTHER): Payer: Medicare Other

## 2018-06-20 VITALS — BP 152/72 | HR 71 | Temp 98.4°F | Resp 16 | Ht 72.0 in | Wt 265.0 lb

## 2018-06-20 DIAGNOSIS — I10 Essential (primary) hypertension: Secondary | ICD-10-CM

## 2018-06-20 DIAGNOSIS — E7849 Other hyperlipidemia: Secondary | ICD-10-CM | POA: Diagnosis not present

## 2018-06-20 DIAGNOSIS — N183 Chronic kidney disease, stage 3 unspecified: Secondary | ICD-10-CM

## 2018-06-20 DIAGNOSIS — M7918 Myalgia, other site: Secondary | ICD-10-CM

## 2018-06-20 DIAGNOSIS — E1122 Type 2 diabetes mellitus with diabetic chronic kidney disease: Secondary | ICD-10-CM | POA: Diagnosis not present

## 2018-06-20 DIAGNOSIS — M7989 Other specified soft tissue disorders: Secondary | ICD-10-CM

## 2018-06-20 DIAGNOSIS — Z794 Long term (current) use of insulin: Secondary | ICD-10-CM

## 2018-06-20 DIAGNOSIS — Z23 Encounter for immunization: Secondary | ICD-10-CM | POA: Diagnosis not present

## 2018-06-20 LAB — COMPREHENSIVE METABOLIC PANEL
ALT: 18 U/L (ref 0–53)
AST: 14 U/L (ref 0–37)
Albumin: 3.6 g/dL (ref 3.5–5.2)
Alkaline Phosphatase: 68 U/L (ref 39–117)
BUN: 20 mg/dL (ref 6–23)
CALCIUM: 9 mg/dL (ref 8.4–10.5)
CO2: 29 mEq/L (ref 19–32)
Chloride: 101 mEq/L (ref 96–112)
Creatinine, Ser: 1.31 mg/dL (ref 0.40–1.50)
GFR: 57.84 mL/min — AB (ref >60.00)
GLUCOSE: 376 mg/dL — AB (ref 70–99)
POTASSIUM: 4.7 meq/L (ref 3.5–5.1)
Sodium: 136 mEq/L (ref 135–145)
TOTAL PROTEIN: 6.9 g/dL (ref 6.0–8.3)
Total Bilirubin: 1.2 mg/dL (ref 0.2–1.2)

## 2018-06-20 LAB — LIPID PANEL
CHOLESTEROL: 220 mg/dL — AB (ref 0–200)
HDL: 43.8 mg/dL (ref >39.00)
LDL CALC: 141 mg/dL — AB (ref 0–99)
NONHDL: 176.35
Total CHOL/HDL Ratio: 5
Triglycerides: 178 mg/dL — ABNORMAL HIGH (ref 0.0–149.0)
VLDL: 35.6 mg/dL (ref 0.0–40.0)

## 2018-06-20 LAB — HEMOGLOBIN A1C: HEMOGLOBIN A1C: 10.6 % — AB (ref 4.6–6.5)

## 2018-06-20 MED ORDER — FUROSEMIDE 20 MG PO TABS: 20.0000 mg | ORAL_TABLET | Freq: Every day | ORAL | 5 refills | Status: DC

## 2018-06-20 MED ORDER — GABAPENTIN 100 MG PO CAPS: 200.0000 mg | ORAL_CAPSULE | Freq: Every day | ORAL | 3 refills | Status: DC

## 2018-06-20 NOTE — Assessment & Plan Note (Signed)
Left lower extremity > right lower extremity Will restart Lasix at 20 mg daily-if this is not effective we will increase to 40 mg CMP today Stressed low-sodium diet, which she is not compliant with Elevate legs Regular exercise and weight loss Follow-up in 2 months

## 2018-06-20 NOTE — Assessment & Plan Note (Signed)
Will check lipid panel, CMP since he is getting blood work done Continue statin given his heart disease and diabetes

## 2018-06-20 NOTE — Assessment & Plan Note (Signed)
Blood pressure slightly elevated here today We will be adding furosemide daily for leg swelling Return in 2 months to see if blood pressure still elevated and to adjust medications if needed CMP

## 2018-06-20 NOTE — Assessment & Plan Note (Signed)
No obvious cause, no radiculopathy Want to avoid steroids given uncontrolled sugars and unable to take NSAIDs He denies any muscle spasms or tightness so we will hold off on a muscle relaxer Trial of gabapentin 200 mg at bedtime-we will titrate up if needed If no improvement needs to see sports medicine

## 2018-06-20 NOTE — Assessment & Plan Note (Signed)
Stressed the importance of following up with Dr. Loanne Drilling Check A1c

## 2018-06-20 NOTE — Patient Instructions (Addendum)
  Tests ordered today. Your results will be released to [facility] (or called to you) after review, usually within 72hours after test completion. If any changes need to be made, you will be notified at that same time.   Medications reviewed and updated.  Changes include :   Starting lasix 20 mg daily and take gabapentin 200 mg at night for your hip pain.    Your prescription(s) have been submitted to your pharmacy. Please take as directed and contact our office if you believe you are having problem(s) with the medication(s).   Please followup in 2 months

## 2018-07-10 ENCOUNTER — Other Ambulatory Visit: Payer: Self-pay | Admitting: Internal Medicine

## 2018-07-17 ENCOUNTER — Other Ambulatory Visit: Payer: Self-pay | Admitting: Internal Medicine

## 2018-07-19 ENCOUNTER — Other Ambulatory Visit: Payer: Self-pay | Admitting: Internal Medicine

## 2018-08-03 ENCOUNTER — Telehealth: Payer: Self-pay | Admitting: Internal Medicine

## 2018-08-03 ENCOUNTER — Other Ambulatory Visit: Payer: Self-pay | Admitting: Internal Medicine

## 2018-08-03 NOTE — Telephone Encounter (Signed)
Attempted to call patient to schedule AWV, but patient did not answer. Will try to call patient at a later time. SF

## 2018-08-14 ENCOUNTER — Telehealth: Payer: Self-pay | Admitting: Medical Oncology

## 2018-08-14 NOTE — Telephone Encounter (Signed)
err

## 2018-08-24 ENCOUNTER — Ambulatory Visit: Payer: Medicare Other | Admitting: Internal Medicine

## 2018-08-29 ENCOUNTER — Ambulatory Visit: Payer: Medicare Other | Admitting: Internal Medicine

## 2018-09-13 ENCOUNTER — Ambulatory Visit: Payer: Medicare Other | Admitting: Internal Medicine

## 2018-09-19 ENCOUNTER — Encounter

## 2018-09-19 ENCOUNTER — Ambulatory Visit (INDEPENDENT_AMBULATORY_CARE_PROVIDER_SITE_OTHER): Payer: Medicare Other | Admitting: Internal Medicine

## 2018-09-19 ENCOUNTER — Encounter: Payer: Self-pay | Admitting: Internal Medicine

## 2018-09-19 VITALS — BP 168/78 | HR 82 | Temp 98.8°F | Resp 18 | Ht 72.0 in | Wt 258.0 lb

## 2018-09-19 DIAGNOSIS — I10 Essential (primary) hypertension: Secondary | ICD-10-CM

## 2018-09-19 DIAGNOSIS — M7918 Myalgia, other site: Secondary | ICD-10-CM

## 2018-09-19 DIAGNOSIS — M7989 Other specified soft tissue disorders: Secondary | ICD-10-CM

## 2018-09-19 DIAGNOSIS — E7849 Other hyperlipidemia: Secondary | ICD-10-CM

## 2018-09-19 DIAGNOSIS — E1122 Type 2 diabetes mellitus with diabetic chronic kidney disease: Secondary | ICD-10-CM | POA: Diagnosis not present

## 2018-09-19 DIAGNOSIS — N183 Chronic kidney disease, stage 3 unspecified: Secondary | ICD-10-CM

## 2018-09-19 DIAGNOSIS — Z794 Long term (current) use of insulin: Secondary | ICD-10-CM

## 2018-09-19 DIAGNOSIS — L03116 Cellulitis of left lower limb: Secondary | ICD-10-CM

## 2018-09-19 DIAGNOSIS — K219 Gastro-esophageal reflux disease without esophagitis: Secondary | ICD-10-CM

## 2018-09-19 MED ORDER — ATORVASTATIN CALCIUM 80 MG PO TABS: 80.0000 mg | ORAL_TABLET | Freq: Every day | ORAL | 1 refills | Status: DC

## 2018-09-19 MED ORDER — DOXYCYCLINE HYCLATE 100 MG PO TABS: 100.0000 mg | ORAL_TABLET | Freq: Two times a day (BID) | ORAL | 0 refills | Status: DC

## 2018-09-19 MED ORDER — GABAPENTIN 100 MG PO CAPS: 200.0000 mg | ORAL_CAPSULE | Freq: Every day | ORAL | 1 refills | Status: DC

## 2018-09-19 MED ORDER — FINASTERIDE 5 MG PO TABS: 5.0000 mg | ORAL_TABLET | Freq: Every day | ORAL | 1 refills | Status: DC

## 2018-09-19 MED ORDER — AMLODIPINE BESYLATE 5 MG PO TABS: 5.0000 mg | ORAL_TABLET | Freq: Every day | ORAL | 1 refills | Status: DC

## 2018-09-19 MED ORDER — PANTOPRAZOLE SODIUM 40 MG PO TBEC: 40.0000 mg | DELAYED_RELEASE_TABLET | Freq: Two times a day (BID) | ORAL | 5 refills | Status: DC

## 2018-09-19 MED ORDER — LISINOPRIL 40 MG PO TABS: 40.0000 mg | ORAL_TABLET | Freq: Every day | ORAL | 1 refills | Status: DC

## 2018-09-19 MED ORDER — FUROSEMIDE 20 MG PO TABS: 20.0000 mg | ORAL_TABLET | Freq: Every day | ORAL | 1 refills | Status: DC

## 2018-09-19 MED ORDER — METOPROLOL SUCCINATE ER 50 MG PO TB24: 50.0000 mg | ORAL_TABLET | Freq: Every day | ORAL | 3 refills | Status: DC

## 2018-09-19 NOTE — Patient Instructions (Addendum)
Follow up with Dr Loanne Drilling.   Stop the prilosec  Start pantoprazole twice daily  - 30 minutes prior to meal.   Start the antibiotic for the left leg infection.  Doxycycline.    Stop coreg.  Start metoprolol 50 mg once a day.     Your medications were sent to your pharmacy.   Follow up in 1-2 months   Gastroesophageal Reflux Disease, Adult Gastroesophageal reflux (GER) happens when acid from the stomach flows up into the tube that connects the mouth and the stomach (esophagus). Normally, food travels down the esophagus and stays in the stomach to be digested. However, when a person has GER, food and stomach acid sometimes move back up into the esophagus. If this becomes a more serious problem, the person may be diagnosed with a disease called gastroesophageal reflux disease (GERD). GERD occurs when the reflux:  Happens often.  Causes frequent or severe symptoms.  Causes problems such as damage to the esophagus. When stomach acid comes in contact with the esophagus, the acid may cause soreness (inflammation) in the esophagus. Over time, GERD may create small holes (ulcers) in the lining of the esophagus. What are the causes? This condition is caused by a problem with the muscle between the esophagus and the stomach (lower esophageal sphincter, or LES). Normally, the LES muscle closes after food passes through the esophagus to the stomach. When the LES is weakened or abnormal, it does not close properly, and that allows food and stomach acid to go back up into the esophagus. The LES can be weakened by certain dietary substances, medicines, and medical conditions, including:  Tobacco use.  Pregnancy.  Having a hiatal hernia.  Alcohol use.  Certain foods and beverages, such as coffee, chocolate, onions, and peppermint. What increases the risk? You are more likely to develop this condition if you:  Have an increased body weight.  Have a connective tissue disorder.  Use NSAID  medicines. What are the signs or symptoms? Symptoms of this condition include:  Heartburn.  Difficult or painful swallowing.  The feeling of having a lump in the throat.  Abitter taste in the mouth.  Bad breath.  Having a large amount of saliva.  Having an upset or bloated stomach.  Belching.  Chest pain. Different conditions can cause chest pain. Make sure you see your health care provider if you experience chest pain.  Shortness of breath or wheezing.  Ongoing (chronic) cough or a night-time cough.  Wearing away of tooth enamel.  Weight loss. How is this diagnosed? Your health care provider will take a medical history and perform a physical exam. To determine if you have mild or severe GERD, your health care provider may also monitor how you respond to treatment. You may also have tests, including:  A test to examine your stomach and esophagus with a small camera (endoscopy).  A test thatmeasures the acidity level in your esophagus.  A test thatmeasures how much pressure is on your esophagus.  A barium swallow or modified barium swallow test to show the shape, size, and functioning of your esophagus. How is this treated? The goal of treatment is to help relieve your symptoms and to prevent complications. Treatment for this condition may vary depending on how severe your symptoms are. Your health care provider may recommend:  Changes to your diet.  Medicine.  Surgery. Follow these instructions at home: Eating and drinking   Follow a diet as recommended by your health care provider. This may involve  avoiding foods and drinks such as: ? Coffee and tea (with or without caffeine). ? Drinks that containalcohol. ? Energy drinks and sports drinks. ? Carbonated drinks or sodas. ? Chocolate and cocoa. ? Peppermint and mint flavorings. ? Garlic and onions. ? Horseradish. ? Spicy and acidic foods, including peppers, chili powder, curry powder, vinegar, hot  sauces, and barbecue sauce. ? Citrus fruit juices and citrus fruits, such as oranges, lemons, and limes. ? Tomato-based foods, such as red sauce, chili, salsa, and pizza with red sauce. ? Fried and fatty foods, such as donuts, french fries, potato chips, and high-fat dressings. ? High-fat meats, such as hot dogs and fatty cuts of red and white meats, such as rib eye steak, sausage, ham, and bacon. ? High-fat dairy items, such as whole milk, butter, and cream cheese.  Eat small, frequent meals instead of large meals.  Avoid drinking large amounts of liquid with your meals.  Avoid eating meals during the 2-3 hours before bedtime.  Avoid lying down right after you eat.  Do not exercise right after you eat. Lifestyle   Do not use any products that contain nicotine or tobacco, such as cigarettes, e-cigarettes, and chewing tobacco. If you need help quitting, ask your health care provider.  Try to reduce your stress by using methods such as yoga or meditation. If you need help reducing stress, ask your health care provider.  If you are overweight, reduce your weight to an amount that is healthy for you. Ask your health care provider for guidance about a safe weight loss goal. General instructions  Pay attention to any changes in your symptoms.  Take over-the-counter and prescription medicines only as told by your health care provider. Do not take aspirin, ibuprofen, or other NSAIDs unless your health care provider told you to do so.  Wear loose-fitting clothing. Do not wear anything tight around your waist that causes pressure on your abdomen.  Raise (elevate) the head of your bed about 6 inches (15 cm).  Avoid bending over if this makes your symptoms worse.  Keep all follow-up visits as told by your health care provider. This is important. Contact a health care provider if:  You have: ? New symptoms. ? Unexplained weight loss. ? Difficulty swallowing or it hurts to  swallow. ? Wheezing or a persistent cough. ? A hoarse voice.  Your symptoms do not improve with treatment. Get help right away if you:  Have pain in your arms, neck, jaw, teeth, or back.  Feel sweaty, dizzy, or light-headed.  Have chest pain or shortness of breath.  Vomit and your vomit looks like blood or coffee grounds.  Faint.  Have stool that is bloody or black.  Cannot swallow, drink, or eat. Summary  Gastroesophageal reflux happens when acid from the stomach flows up into the esophagus. GERD is a disease in which the reflux happens often, causes frequent or severe symptoms, or causes problems such as damage to the esophagus.  Treatment for this condition may vary depending on how severe your symptoms are. Your health care provider may recommend diet and lifestyle changes, medicine, or surgery.  Contact a health care provider if you have new or worsening symptoms.  Take over-the-counter and prescription medicines only as told by your health care provider. Do not take aspirin, ibuprofen, or other NSAIDs unless your health care provider told you to do so.  Keep all follow-up visits as told by your health care provider. This is important. This information is not intended  to replace advice given to you by your health care provider. Make sure you discuss any questions you have with your health care provider. Document Released: 06/08/2005 Document Revised: 03/07/2018 Document Reviewed: 03/07/2018 Elsevier Interactive Patient Education  2019 [employer].

## 2018-09-19 NOTE — Assessment & Plan Note (Signed)
Persistent swelling in left lower extremity-since his last visit he has had some pain in his leg and on exam there is some mild erythema and warmth Symptoms consistent with cellulitis Start doxycycline twice daily x10 days Continue Lasix daily

## 2018-09-19 NOTE — Assessment & Plan Note (Signed)
Stressed importance of following up with Dr. Antony Odea about switching his insulin regimen to long-acting insulin, but I worry about compliance since he is not able to take any of his medications more than once a day not sure if he could tolerate multiple injections a day Stressed checking his sugars and taking a log to Dr. Loanne Drilling so we can adjust medication Taking metformin only once a day Stressed low sugar/carbohydrate diet Encouraged regular exercise

## 2018-09-19 NOTE — Assessment & Plan Note (Signed)
Has had severe, uncontrolled GERD for 1 week, but does have a history of uncontrolled heartburn in the past Was on omeprazole in the past, which he was not taking up until recently-taking over-the-counter omeprazole and typically taking 5 in 1 day Symptoms worse with eating and laying down Stressed GERD diet-information given and reviewed No eating within 3 hours of going to bed Elevate head of bed, weight loss Start pantoprazole 40 mg twice daily Unlikely cardiac in nature given recent catheterization Close follow-up

## 2018-09-19 NOTE — Progress Notes (Signed)
Subjective:    Patient ID: Russell Trevino, male    DOB: Jan 23, 1951, 68 y.o.   MRN: [id number]  HPI The patient is here for follow up.  Leg swelling:  He was seen here for three months ago and had mild LLE edema > RLE edema.  We restarted lasix which he is taking daily.  He states he is compliant with a low-sodium diet, but only eats out.  He is not cook at all at home.  He still has left leg swelling.  He denies any right leg swelling.  He has tenderness in the left lower leg, which concerns him.    Elevated BP:  He takes his medications daily.  He did go without his medications for 1 week, but over the past several days he has been taking all of his medications as prescribed.  He does follow with cardiology.  He does experience shortness of breath, chest pain, leg edema and palpitations.  He did have a cardiac catheterization within the past year, which showed disease in 1 blood vessel that needed to be treated medically.  He is not exercising regularly.  He does not monitor his blood pressure at home.    Buttock pain, hip pain:  It started three months ago. There was no radiculopathy.  He is taking gabapentin at night.    Diabetes: he has not seen Dr Loanne Drilling is almost a year. His last a1c was over 10.   He is taking his medication daily as prescribed. He is not compliant with a diabetic diet. He is not exercising regularly.    GERD:  He is having severe GERD that started one week ago.  He occasionally has it during the day, but has had it at night and it is severe.  He can not sleep.  He has to sit up at night to breath.  He first thought he was having a heart attack, but has continued to have it nightly.  He takes three Prilosec pills at night and it helps.  After a hour or hour and a half his symptoms resolve.  He takes another 2 pills often in the middle of the night for a total of 5 pills a day.  He denies any changes in diet that may have caused the problem and it seemed to have started  for no reason.  He does drink a lot of tea and eat out for all of his meals.  Certain foods will make his symptoms worse like 2 pieces of pepperoni pizza that he ate the other night, but he has symptoms with anything he eats.  Eating does make the symptoms worse.   Medications and allergies reviewed with patient and updated if appropriate.  Patient Active Problem List   Diagnosis Date Noted  . Leg swelling 06/20/2018  . Right buttock pain 06/20/2018  . Chest pain on exertion 01/04/2018  . Diabetes ([place]) 08/26/2017  . GERD (gastroesophageal reflux disease) 07/13/2017  . Cough 08/17/2016  . Thoracic back pain 06/21/2016  . Hx of CABG 05/26/2016  . Prostate cancer ([place]) 05/26/2016  . Essential hypertension, benign 05/26/2016  . Hyperlipidemia 05/26/2016  . Cardiomyopathy ([place]) 03/25/2013  . Chronic coronary artery disease 03/25/2013  . Left ventricular systolic dysfunction 44/[id number]/[id number]  . MI (myocardial infarction) ([place]) 01/09/2012    Current Outpatient Medications on File Prior to Visit  Medication Sig Dispense Refill  . amLODipine (NORVASC) 5 MG tablet TAKE 1 TABLET BY MOUTH ONCE DAILY 90 tablet 0  .  aspirin 81 MG tablet Take 81 mg by mouth daily.    Marland Kitchen atorvastatin (LIPITOR) 80 MG tablet Take 1 tablet (80 mg total) by mouth daily. 90 tablet 3  . BD VEO INSULIN SYR ULTRAFINE 31G X 15/64" 1 ML MISC use as directed TO INJECT INSULIN  twice a day 10 each 0  . BRILINTA 90 MG TABS tablet   10  . carvedilol (COREG) 6.25 MG tablet TAKE 1 TABLET BY MOUTH DAILY 90 tablet 0  . finasteride (PROSCAR) 5 MG tablet TAKE 1 TABLET BY MOUTH ONCE DAILY 90 tablet 0  . furosemide (LASIX) 20 MG tablet Take 1 tablet (20 mg total) by mouth daily. 30 tablet 5  . gabapentin (NEURONTIN) 100 MG capsule Take 2 capsules (200 mg total) by mouth at bedtime. 60 capsule 3  . insulin NPH-regular Human (HUMULIN 70/30) (70-30) 100 UNIT/ML injection 90 units with breakfast, and 55 units with supper 50 mL 3  . lisinopril  (PRINIVIL,ZESTRIL) 40 MG tablet Take 1 tablet (40 mg total) daily by mouth. 90 tablet 3  . metFORMIN (GLUCOPHAGE) 1000 MG tablet Take 1 tablet (1,000 mg total) by mouth 2 (two) times daily with a meal. 180 tablet 1  . sildenafil (REVATIO) 20 MG tablet TAKE 1 TABLET BY MOUTH THREE TIMES DAILY 90 tablet 2   No current facility-administered medications on file prior to visit.     Past Medical History:  Diagnosis Date  . BPH (benign prostatic hypertrophy)   . Cardiomyopathy, ischemic   . Coronary artery disease CARDIOLOGIST- DR Frederico Hamman  ([facility])--  LAST VISIT APRIL 2013--  REQUEST FOR RECORD FAXED   LAST CARDIAC CATH 2009 (GRAFTS PATENT)/ LAST STRESS TEST 2010-  PER PT OK  . Diabetes mellitus, type 2 ([place])   . Frequency of urination   . Gross hematuria   . History of CHF (congestive heart failure) 2007  . History of kidney stones   . History of myocardial infarction 2006 --  NON ST ELEVATED  MI  S/P CABG  , [facility]  . History of urinary retention   . Hyperlipidemia   . Nocturia   . Renal calculi BILATERAL PER CT SCAN--  NON-OBSTRUCTIVE  . S/P CABG x 4 2006  . Urgency of urination     Past Surgical History:  Procedure Laterality Date  . CARDIAC CATHETERIZATION  01-17-2008  DR SANTOS ([facility])   GRAFTS PATENT / EF 40%  . CORONARY ARTERY BYPASS GRAFT  AUG 2006   X4  VESSEL  . CYSTO / RIGHT URETERAL STENT PLACEMENT  10-08-1999  . CYSTO/  REMOVAL BLADDER STONES  01-14-2010  . CYSTOSCOPY  10/16/2012   Procedure: CYSTOSCOPY;  Surgeon: Malka So, MD;  Location[facility];  Service: Urology;  Laterality: N/A;  Cystoscopy with Clot Evacuation and fulguration bladder neck.  Marland Kitchen EXTRACORPOREAL SHOCK WAVE LITHOTRIPSY  10-15-2009   RIGHT  . KNEE ARTHROSCOPY  1978   RIGHT  . LEFT SHOULDER SURGERY   2002  . LUMBAR FUSION  09-10-2008   L4 - L5  . PERCUTANEOUS NEPHROSTOLITHOTOMY  1979  . PROSTATE BIOPSY  10/16/2012   Procedure: PROSTATE BIOPSY;  Surgeon: Malka So, MD;   Location: [facility];  Service: Urology;  Laterality: N/A;  Through ultrasound.  Marland Kitchen SHOULDER ARTHROSCOPY W/ SUBACROMIAL DECOMPRESSION AND DISTAL CLAVICLE EXCISION  10-11-2004   AND PARTIAL ROTATOR CUFF REPAIR  . TRANSTHORACIC ECHOCARDIOGRAM  01-09-2012  [facility]   LV SYSTOLIC FUNCTION MILDLY REDUCED/ EF 48%/ LV FILLING  PATTERN  IS IMPAIRED/ HYPOKINESIS IN THE MID AND BASALAR SEPTUM & ANTEROSEPTUM  . TURP VAPORIZATION  01-19-2010   BPH W/ BOO  . URETEROLITHOTOMY  1976    Social History   Socioeconomic History  . Marital status: Married    Spouse name: Not on file  . Number of children: Not on file  . Years of education: Not on file  . Highest education level: Not on file  Occupational History  . Not on file  Social Needs  . Financial resource strain: Not on file  . Food insecurity:    Worry: Not on file    Inability: Not on file  . Transportation needs:    Medical: Not on file    Non-medical: Not on file  Tobacco Use  . Smoking status: Never Smoker  . Smokeless tobacco: Never Used  Substance and Sexual Activity  . Alcohol use: No  . Drug use: No  . Sexual activity: Not on file  Lifestyle  . Physical activity:    Days per week: Not on file    Minutes per session: Not on file  . Stress: Not on file  Relationships  . Social connections:    Talks on phone: Not on file    Gets together: Not on file    Attends religious service: Not on file    Active member of club or organization: Not on file    Attends meetings of clubs or organizations: Not on file    Relationship status: Not on file  Other Topics Concern  . Not on file  Social History Narrative  . Not on file    Family History  Problem Relation Age of Onset  . Diabetes Father     Review of Systems  Constitutional: Negative for chills and fever.  Respiratory: Positive for cough (occ), shortness of breath (chronic,  no change) and wheezing (occ, no new).   Cardiovascular: Positive for chest pain,  palpitations and leg swelling.  Gastrointestinal: Positive for nausea (one night). Negative for abdominal pain, blood in stool (no black stool), constipation and diarrhea.  Neurological: Positive for dizziness (occ with bending over). Negative for light-headedness and headaches.       Objective:   Vitals:   09/19/18 1522  BP: (!) 168/78  Pulse: 82  Resp: 18  Temp: 98.8 F (37.1 C)  SpO2: 97%   BP Readings from Last 3 Encounters:  09/19/18 (!) 168/78  06/20/18 (!) 152/72  01/04/18 120/60   Wt Readings from Last 3 Encounters:  09/19/18 258 lb (117 kg)  06/20/18 265 lb (120.2 kg)  01/04/18 262 lb (118.8 kg)   Body mass index is 34.99 kg/m.   Physical Exam    Constitutional: Appears well-developed and well-nourished. No distress.  HENT:  Head: Normocephalic and atraumatic.  Neck: Neck supple. No tracheal deviation present. No thyromegaly present.  No cervical lymphadenopathy Cardiovascular: Normal rate, regular rhythm and normal heart sounds.   2/6 systolic murmur heard. No carotid bruit .  1+ left lower extremity edema with mild tenderness lower anterior left leg associated with some erythema and mild warmth, no fluctuance or break in skin; no right lower extremity edema Pulmonary/Chest: Effort normal and breath sounds normal. No respiratory distress. No has no wheezes. No rales. Abdomen: Soft, nontender, nondistended, obese Skin: Skin is warm and dry. Not diaphoretic.  Psychiatric: Normal mood and affect. Behavior is normal.      Assessment & Plan:    40 minutes were spent face-to-face with the  patient, over 50% of which was spent counseling regarding his multiple uncontrolled chronic and acute medical problems.  Treatment options were discussed and initiated.  Discussed etiology, change in lifestyle and importance of getting his medical problems better controlled.  Discussed importance of following up with a specialist-cardiology and endocrine.     See Problem  List for Assessment and Plan of chronic medical problems.

## 2018-09-19 NOTE — Assessment & Plan Note (Signed)
Uncontrolled despite taking all his medications, but has been noncompliant with his medications at times He states he takes all of his medications once a day only-he is only taking the carvedilol once a day and does not think that he can take it twice a day Discontinue carvedilol and change to metoprolol 50 mg daily-most likely will need to increase this Stressed that he needs to start monitoring his blood pressure and make lifestyle changes-decrease salt intake and eat more healthy Close follow-up to recheck blood pressure and adjust medication

## 2018-09-19 NOTE — Assessment & Plan Note (Signed)
Left leg swelling only-typically his left leg is more swollen than the right, which is chronic-has erythema, warmth and tenderness in the left lower leg-symptoms consistent with cellulitis and will treat with an antibiotic Continue Lasix daily Stressed compliance with a low-sodium diet Close follow-up

## 2018-09-19 NOTE — Assessment & Plan Note (Signed)
Taking gabapentin at night-can continue

## 2018-09-19 NOTE — Assessment & Plan Note (Signed)
Continue atorvastatin 80 mg daily. 

## 2018-09-27 ENCOUNTER — Ambulatory Visit: Payer: Self-pay | Admitting: *Deleted

## 2018-09-27 MED ORDER — DEXLANSOPRAZOLE 60 MG PO CPDR: 60.0000 mg | DELAYED_RELEASE_CAPSULE | Freq: Two times a day (BID) | ORAL | 5 refills | Status: DC

## 2018-09-27 NOTE — Telephone Encounter (Signed)
Spoke with pt to advise Dr. Quay Burow was out of the office for the rest of the afternoon but as soon as I get a response I will call him. Pt understood. Please advise on note below.

## 2018-09-27 NOTE — Telephone Encounter (Signed)
Pt aware.

## 2018-09-27 NOTE — Telephone Encounter (Signed)
Message from Vernona Rieger sent at 09/27/2018 11:41 AM EST   Patient feels like he is not getting any relief with the Pantoprazole Sodium 40 mg. He said he still has heartburn just about every time he eats. He would like to know if he could have something else?   LOV with Dr. Quay Burow on 09/19/18. Pt was started on Protonix 40 mg twice a day.

## 2018-09-27 NOTE — Telephone Encounter (Signed)
Stop protonix - start new medication - dexilant twice daily.  Make sure he is eating a GERD diet

## 2018-09-27 NOTE — Addendum Note (Signed)
Addended by: Binnie Rail on: 09/27/2018 02:48 PM   Modules accepted: Orders

## 2018-09-27 NOTE — Telephone Encounter (Signed)
  Reason for Disposition . [1] Follow-up call from patient regarding patient's clinical status AND [2] information urgent  Protocols used: PCP CALL - NO TRIAGE-A-AH

## 2018-09-27 NOTE — Addendum Note (Signed)
Addended by: Delice Bison E on: 09/27/2018 03:00 PM   Modules accepted: Orders

## 2018-10-16 LAB — HM DIABETES EYE EXAM

## 2018-10-17 ENCOUNTER — Telehealth: Payer: Self-pay | Admitting: *Deleted

## 2018-10-17 NOTE — Telephone Encounter (Signed)
LVM for patient to call back in regards to scheduling AWV with our health coach.  

## 2018-10-21 NOTE — Progress Notes (Signed)
Subjective:    Patient ID: Russell Trevino, male    DOB: 16-Jun-1951, 68 y.o.   MRN: [id number]  HPI The patient is here for follow up.   Left leg swelling:  He is taking lasix daily.  He is compliant with a low sodium diet.  He completed the antibiotic. His left leg swelling has resolved and it is no longer painful.  He feels like the swelling is well controlled.   Diabetes: He is taking his medication daily as prescribed. He is more compliant with a diabetic diet. He is not exercising regularly. He monitors his sugars and they have been running 150-160's.    GERD:  He tried taking the protonix 40 mg twice daily that was started one month ago-this did not help so he called in we will switch Dexilant 60 mg twice daily.  He is taking this 2-3 times a day.  It helps a little, but he is still having severe symptoms.  He has not tried taking any over-the-counter.  He has been compliant with a bland diet.   His heartburn increases when he exerts himself or when he lays down.  Shortness of breath: He is currently experiencing shortness of breath with exertion and when laying down at night.  Last night he was not able to get a deep breath.  If he laid down flat he had to get up.  He needs a few pillows to sleep.   The past couple nights he has not been able to sleep because of this.  He denies any chest pain, just discomfort from what he feels is heartburn.  Hypertension: He is taking his medication daily. He is compliant with a low sodium diet.   He is not exercising regularly.  He does not monitor his blood pressure at home.     Medications and allergies reviewed with patient and updated if appropriate.  Patient Active Problem List   Diagnosis Date Noted  . SOB (shortness of breath) 10/22/2018  . Left leg cellulitis 09/19/2018  . Leg swelling 06/20/2018  . Right buttock pain 06/20/2018  . Chest pain on exertion 01/04/2018  . Diabetes ([place]) 08/26/2017  . GERD (gastroesophageal reflux  disease) 07/13/2017  . Cough 08/17/2016  . Thoracic back pain 06/21/2016  . Hx of CABG 05/26/2016  . Prostate cancer ([place]) 05/26/2016  . Essential hypertension, benign 05/26/2016  . Hyperlipidemia 05/26/2016  . Cardiomyopathy ([place]) 03/25/2013  . Chronic coronary artery disease 03/25/2013  . Left ventricular systolic dysfunction 82/99/3716  . MI (myocardial infarction) ([place]) 01/09/2012    Current Outpatient Medications on File Prior to Visit  Medication Sig Dispense Refill  . amLODipine (NORVASC) 5 MG tablet Take 1 tablet (5 mg total) by mouth daily. 90 tablet 1  . aspirin 81 MG tablet Take 81 mg by mouth daily.    Marland Kitchen atorvastatin (LIPITOR) 80 MG tablet Take 1 tablet (80 mg total) by mouth daily. 90 tablet 1  . BD VEO INSULIN SYR ULTRAFINE 31G X 15/64" 1 ML MISC use as directed TO INJECT INSULIN  twice a day 10 each 0  . BRILINTA 90 MG TABS tablet   10  . dexlansoprazole (DEXILANT) 60 MG capsule Take 1 capsule (60 mg total) by mouth 2 (two) times daily before a meal. 60 capsule 5  . finasteride (PROSCAR) 5 MG tablet Take 1 tablet (5 mg total) by mouth daily. 90 tablet 1  . furosemide (LASIX) 20 MG tablet Take 1 tablet (20 mg total) by  mouth daily. 90 tablet 1  . gabapentin (NEURONTIN) 100 MG capsule Take 2 capsules (200 mg total) by mouth at bedtime. 60 capsule 1  . insulin NPH-regular Human (HUMULIN 70/30) (70-30) 100 UNIT/ML injection 90 units with breakfast, and 55 units with supper 50 mL 3  . lisinopril (PRINIVIL,ZESTRIL) 40 MG tablet Take 1 tablet (40 mg total) by mouth daily. 90 tablet 1  . metFORMIN (GLUCOPHAGE) 1000 MG tablet Take 1 tablet (1,000 mg total) by mouth 2 (two) times daily with a meal. 180 tablet 1  . metoprolol succinate (TOPROL-XL) 50 MG 24 hr tablet Take 1 tablet (50 mg total) by mouth daily. Take with or immediately following a meal. 90 tablet 3  . sildenafil (REVATIO) 20 MG tablet TAKE 1 TABLET BY MOUTH THREE TIMES DAILY 90 tablet 2   No current  facility-administered medications on file prior to visit.     Past Medical History:  Diagnosis Date  . BPH (benign prostatic hypertrophy)   . Cardiomyopathy, ischemic   . Coronary artery disease CARDIOLOGIST- DR Frederico Hamman  ([facility])--  LAST VISIT APRIL 2013--  REQUEST FOR RECORD FAXED   LAST CARDIAC CATH 2009 (GRAFTS PATENT)/ LAST STRESS TEST 2010-  PER PT OK  . Diabetes mellitus, type 2 ([place])   . Frequency of urination   . Gross hematuria   . History of CHF (congestive heart failure) 2007  . History of kidney stones   . History of myocardial infarction 2006 --  NON ST ELEVATED  MI  S/P CABG  , [facility]  . History of urinary retention   . Hyperlipidemia   . Nocturia   . Renal calculi BILATERAL PER CT SCAN--  NON-OBSTRUCTIVE  . S/P CABG x 4 2006  . Urgency of urination     Past Surgical History:  Procedure Laterality Date  . CARDIAC CATHETERIZATION  01-17-2008  DR SANTOS ([facility])   GRAFTS PATENT / EF 40%  . CORONARY ARTERY BYPASS GRAFT  AUG 2006   X4  VESSEL  . CYSTO / RIGHT URETERAL STENT PLACEMENT  10-08-1999  . CYSTO/  REMOVAL BLADDER STONES  01-14-2010  . CYSTOSCOPY  10/16/2012   Procedure: CYSTOSCOPY;  Surgeon: Malka So, MD;  Location: [facility];  Service: Urology;  Laterality: N/A;  Cystoscopy with Clot Evacuation and fulguration bladder neck.  Marland Kitchen EXTRACORPOREAL SHOCK WAVE LITHOTRIPSY  10-15-2009   RIGHT  . KNEE ARTHROSCOPY  1978   RIGHT  . LEFT SHOULDER SURGERY   2002  . LUMBAR FUSION  09-10-2008   L4 - L5  . PERCUTANEOUS NEPHROSTOLITHOTOMY  1979  . PROSTATE BIOPSY  10/16/2012   Procedure: PROSTATE BIOPSY;  Surgeon: Malka So, MD;  Location: [facility];  Service: Urology;  Laterality: N/A;  Through ultrasound.  Marland Kitchen SHOULDER ARTHROSCOPY W/ SUBACROMIAL DECOMPRESSION AND DISTAL CLAVICLE EXCISION  10-11-2004   AND PARTIAL ROTATOR CUFF REPAIR  . TRANSTHORACIC ECHOCARDIOGRAM  01-09-2012  [facility]   LV SYSTOLIC FUNCTION MILDLY REDUCED/ EF 48%/  LV FILLING PATTERN  IS IMPAIRED/ HYPOKINESIS IN THE MID AND BASALAR SEPTUM & ANTEROSEPTUM  . TURP VAPORIZATION  01-19-2010   BPH W/ BOO  . URETEROLITHOTOMY  1976    Social History   Socioeconomic History  . Marital status: Married    Spouse name: Not on file  . Number of children: Not on file  . Years of education: Not on file  . Highest education level: Not on file  Occupational History  . Not on file  Social  Needs  . Financial resource strain: Not on file  . Food insecurity:    Worry: Not on file    Inability: Not on file  . Transportation needs:    Medical: Not on file    Non-medical: Not on file  Tobacco Use  . Smoking status: Never Smoker  . Smokeless tobacco: Never Used  Substance and Sexual Activity  . Alcohol use: No  . Drug use: No  . Sexual activity: Not on file  Lifestyle  . Physical activity:    Days per week: Not on file    Minutes per session: Not on file  . Stress: Not on file  Relationships  . Social connections:    Talks on phone: Not on file    Gets together: Not on file    Attends religious service: Not on file    Active member of club or organization: Not on file    Attends meetings of clubs or organizations: Not on file    Relationship status: Not on file  Other Topics Concern  . Not on file  Social History Narrative  . Not on file    Family History  Problem Relation Age of Onset  . Diabetes Father     Review of Systems  Constitutional: Negative for fever.  Respiratory: Positive for cough, shortness of breath and wheezing (at night when laying flat).   Cardiovascular: Positive for leg swelling (improved). Negative for chest pain and palpitations.  Gastrointestinal: Negative for abdominal pain, blood in stool, constipation, diarrhea and nausea.       Increased burping  Neurological: Negative for light-headedness and headaches.       Objective:   Vitals:   10/22/18 1026  BP: (!) 158/80  Pulse: 84  Resp: 20  Temp: 98.8 F (37.1  C)  SpO2: 96%   BP Readings from Last 3 Encounters:  10/22/18 (!) 158/80  09/19/18 (!) 168/78  06/20/18 (!) 152/72   Wt Readings from Last 3 Encounters:  10/22/18 258 lb (117 kg)  09/19/18 258 lb (117 kg)  06/20/18 265 lb (120.2 kg)   Body mass index is 34.99 kg/m.   Physical Exam    Constitutional: Appears well-developed and well-nourished. No distress.  HENT:  Head: Normocephalic and atraumatic.  Neck: Neck supple. No tracheal deviation present. No thyromegaly present.  No cervical lymphadenopathy Cardiovascular: Normal rate, regular rhythm and normal heart sounds.   No murmur heard. No carotid bruit .  No edema Pulmonary/Chest: Effort normal and breath sounds normal. No respiratory distress. No has no wheezes. No rales.  Abdomen:  Obese, soft, NT Skin: Skin is warm and dry. Not diaphoretic.  Psychiatric: Normal mood and affect. Behavior is normal.      Assessment & Plan:    See Problem List for Assessment and Plan of chronic medical problems.

## 2018-10-22 ENCOUNTER — Encounter: Payer: Self-pay | Admitting: Internal Medicine

## 2018-10-22 ENCOUNTER — Other Ambulatory Visit (INDEPENDENT_AMBULATORY_CARE_PROVIDER_SITE_OTHER): Payer: Medicare Other

## 2018-10-22 ENCOUNTER — Ambulatory Visit (INDEPENDENT_AMBULATORY_CARE_PROVIDER_SITE_OTHER)
Admission: RE | Admit: 2018-10-22 | Discharge: 2018-10-22 | Disposition: A | Discharge status: Home / Self Care | Payer: Medicare Other | Source: Ambulatory Visit | Attending: Internal Medicine | Admitting: Internal Medicine

## 2018-10-22 ENCOUNTER — Ambulatory Visit (INDEPENDENT_AMBULATORY_CARE_PROVIDER_SITE_OTHER): Payer: Medicare Other | Admitting: Internal Medicine

## 2018-10-22 ENCOUNTER — Other Ambulatory Visit: Payer: Self-pay | Admitting: Internal Medicine

## 2018-10-22 VITALS — BP 158/80 | HR 84 | Temp 98.8°F | Resp 20 | Ht 72.0 in | Wt 258.0 lb

## 2018-10-22 DIAGNOSIS — E1122 Type 2 diabetes mellitus with diabetic chronic kidney disease: Secondary | ICD-10-CM | POA: Diagnosis not present

## 2018-10-22 DIAGNOSIS — E7849 Other hyperlipidemia: Secondary | ICD-10-CM

## 2018-10-22 DIAGNOSIS — I10 Essential (primary) hypertension: Secondary | ICD-10-CM

## 2018-10-22 DIAGNOSIS — Z794 Long term (current) use of insulin: Secondary | ICD-10-CM

## 2018-10-22 DIAGNOSIS — R079 Chest pain, unspecified: Secondary | ICD-10-CM | POA: Diagnosis not present

## 2018-10-22 DIAGNOSIS — N183 Chronic kidney disease, stage 3 unspecified: Secondary | ICD-10-CM

## 2018-10-22 DIAGNOSIS — R0602 Shortness of breath: Secondary | ICD-10-CM | POA: Insufficient documentation

## 2018-10-22 DIAGNOSIS — K219 Gastro-esophageal reflux disease without esophagitis: Secondary | ICD-10-CM

## 2018-10-22 LAB — COMPREHENSIVE METABOLIC PANEL
ALT: 13 U/L (ref 0–53)
AST: 23 U/L (ref 0–37)
Albumin: 3.7 g/dL (ref 3.5–5.2)
Alkaline Phosphatase: 81 U/L (ref 39–117)
BUN: 22 mg/dL (ref 6–23)
CHLORIDE: 102 meq/L (ref 96–112)
CO2: 28 mEq/L (ref 19–32)
Calcium: 9 mg/dL (ref 8.4–10.5)
Creatinine, Ser: 1.26 mg/dL (ref 0.40–1.50)
GFR: 56.86 mL/min — ABNORMAL LOW (ref >60.00)
Glucose, Bld: 246 mg/dL — ABNORMAL HIGH (ref 70–99)
Potassium: 3.9 mEq/L (ref 3.5–5.1)
Sodium: 137 mEq/L (ref 135–145)
Total Bilirubin: 1.8 mg/dL — ABNORMAL HIGH (ref 0.2–1.2)
Total Protein: 6.9 g/dL (ref 6.0–8.3)

## 2018-10-22 LAB — LIPID PANEL
CHOL/HDL RATIO: 6
Cholesterol: 227 mg/dL — ABNORMAL HIGH (ref 0–200)
HDL: 38.2 mg/dL — ABNORMAL LOW (ref >39.00)
LDL Cholesterol: 150 mg/dL — ABNORMAL HIGH (ref 0–99)
NonHDL: 188.69
TRIGLYCERIDES: 192 mg/dL — AB (ref 0.0–149.0)
VLDL: 38.4 mg/dL (ref 0.0–40.0)

## 2018-10-22 LAB — CBC WITH DIFFERENTIAL/PLATELET
BASOS PCT: 0.2 % (ref 0.0–3.0)
Basophils Absolute: 0 10*3/uL (ref 0.0–0.1)
EOS PCT: 1.6 % (ref 0.0–5.0)
Eosinophils Absolute: 0.2 10*3/uL (ref 0.0–0.7)
HCT: 39 % (ref 39.0–52.0)
Hemoglobin: 13.3 g/dL (ref 13.0–17.0)
Lymphocytes Relative: 13.4 % (ref 12.0–46.0)
Lymphs Abs: 1.3 10*3/uL (ref 0.7–4.0)
MCHC: 34.2 g/dL (ref 30.0–36.0)
MCV: 87.4 fl (ref 78.0–100.0)
Monocytes Absolute: 0.9 10*3/uL (ref 0.1–1.0)
Monocytes Relative: 8.6 % (ref 3.0–12.0)
Neutro Abs: 7.6 10*3/uL (ref 1.4–7.7)
Neutrophils Relative %: 76.2 % (ref 43.0–77.0)
Platelets: 183 10*3/uL (ref 150.0–400.0)
RBC: 4.46 Mil/uL (ref 4.22–5.81)
RDW: 14.5 % (ref 11.5–15.5)
WBC: 10 10*3/uL (ref 4.0–10.5)

## 2018-10-22 LAB — HEMOGLOBIN A1C: Hgb A1c MFr Bld: 9.9 % — ABNORMAL HIGH (ref 4.6–6.5)

## 2018-10-22 MED ORDER — SUCRALFATE 1 G PO TABS: 1.0000 g | ORAL_TABLET | Freq: Three times a day (TID) | ORAL | 0 refills | Status: DC

## 2018-10-22 NOTE — Assessment & Plan Note (Signed)
He describes the pain is more of a discomfort and feels it is related to his GERD EKG today shows sinus rhythm at 79 bpm, nonspecific ST depression and T wave abnormality, no significant change compared to prior EKG Chest pain seems to be related to GERD, but given cardiac history advised him to call his cardiologist today to discuss his symptoms.  He did have a Cardiac catheterization and echocardiogram within the last year We will start Carafate for GERD, continue Dexilant Referred to GI Chest x-ray today

## 2018-10-22 NOTE — Assessment & Plan Note (Signed)
Experiencing shortness of breath with exertion and when laying down Chest x-ray today CBC, CMP Less than 1 year ago had a cardiac catheterization and echocardiogram-does not seem to be cardiac in nature, but did ask him to call his cardiologist and let them know what symptoms he is having His nighttime shortness of breath seems to be related to uncontrolled GERD

## 2018-10-22 NOTE — Assessment & Plan Note (Signed)
He is eating better He is taking his medication more religiously, but still working on that Not currently exercising-stressed regular exercise Will check A1c

## 2018-10-22 NOTE — Assessment & Plan Note (Signed)
Not controlled Prevacid not effective Pantoprazole not effective Currently taking Dexilant 60 mg twice a day before meals and before bedtime Not taking anything over-the-counter recently he has been eating a bland diet and still having significant symptoms Will try Carafate Will refer to GI

## 2018-10-22 NOTE — Assessment & Plan Note (Signed)
Continue statin Check lipid panel, CMP 

## 2018-10-22 NOTE — Assessment & Plan Note (Signed)
BP ?  Controlled He is somewhat anxious today because of the chest pain and GERD symptoms he is having.  He is also not slept for the last couple of nights it is difficult to say if that is elevating his blood pressure Continue current medications at current doses Will need close follow-up CMP

## 2018-10-22 NOTE — Patient Instructions (Signed)
Have blood work and a chest x-ray today.   Tests ordered today. Your results will be released to [facility] (or called to you) after review, usually within 72hours after test completion. If any changes need to be made, you will be notified at that same time.   Medications reviewed and updated.  Changes include :   carafate before meals and bedtime  Your prescription(s) have been submitted to your pharmacy. Please take as directed and contact our office if you believe you are having problem(s) with the medication(s).

## 2018-10-24 ENCOUNTER — Encounter (INDEPENDENT_AMBULATORY_CARE_PROVIDER_SITE_OTHER): Payer: Self-pay

## 2018-10-24 ENCOUNTER — Encounter: Payer: Self-pay | Admitting: Physician Assistant

## 2018-10-24 ENCOUNTER — Ambulatory Visit: Payer: Medicare Other | Admitting: Physician Assistant

## 2018-10-24 VITALS — BP 134/86 | HR 78 | Ht 72.0 in | Wt 260.0 lb

## 2018-10-24 DIAGNOSIS — R0602 Shortness of breath: Secondary | ICD-10-CM

## 2018-10-24 DIAGNOSIS — R142 Eructation: Secondary | ICD-10-CM | POA: Diagnosis not present

## 2018-10-24 DIAGNOSIS — R079 Chest pain, unspecified: Secondary | ICD-10-CM | POA: Diagnosis not present

## 2018-10-24 MED ORDER — DEXLANSOPRAZOLE 60 MG PO CPDR: 60.0000 mg | DELAYED_RELEASE_CAPSULE | Freq: Every day | ORAL | 0 refills | Status: DC

## 2018-10-24 NOTE — Patient Instructions (Addendum)
  Please go immediately to the [facility] Emergency Room to be seen for a cardiac evaluation.  Please continue your Dexilant and Carafate for another month. I have sent in a refill on your Dexilant.   I appreciate the opportunity to care for you. Amy Esterwood, PA-C

## 2018-10-24 NOTE — Progress Notes (Signed)
____________________________________________________________  Attending physician addendum:  Thank you for sending this case to me. I have reviewed the entire note, and the outlined plan seems appropriate.  Yes, worrisome for escalating angina. ED most appropriate.  Wilfrid Lund, MD  ____________________________________________________________

## 2018-10-24 NOTE — Progress Notes (Signed)
Subjective:    Patient ID: Russell Trevino, male    DOB: Mar 09, 1951, 68 y.o.   MRN: [id number]  HPI Russell Trevino is a pleasant 68 year old white male new to GI today referred by Dr. Billey Gosling for evaluation of possible severe GERD.  Patient has not had any prior GI evaluation. He does have history of ischemic cardiomyopathy with last EF over a year ago documented at 60.  He has severe coronary artery disease is status post prior CABG, and underwent cardiac catheterization in April 2019 at [facility], which was done for exertional dyspnea.  He was felt to have had a non-STE MI.  Troponins were elevated and BNP elevated at 414.  Cath showed 100% proximal SVG-PDA which appeared chronic.  Patent LIMA-LAD and distal RCA/PDA filling via collaterals. Diagnosed with multivessel coronary artery disease, no intervention was done and was to maximize medical therapy. Patient has not been seen by cardiology since that time.  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 He presents now with acute onset about 3 weeks ago with burning chest discomfort associated with increased gas and belching.  Appendectomy was having symptoms intermittently throughout the day not necessarily aggravated by p.o. intake.  His symptoms were bothering him a lot at nighttime.  He says he was having a hard time lying down flat which made him feel short of breath and was having to sit up. He was seen by primary care and started on a PPI which did not seem to help.  He was seen back by Dr. Quay Burow last week and started on Dexilant 60 mg p.o. daily and Carafate 4 times daily. Over the past week or so he has developed exertional dyspnea.  He has had shortness of breath with significant activity including climbing a flight of stairs.  Activity is now bringing on his chest pain and burning.  He has having symptoms off and on throughout the day and at night.  When he develops an  episode of pain he will sit down and pain will generally resolve within 5 to 10 minutes.  He points to his upper mid chest. He has no complaints of abdominal pain, no nausea or vomiting.  He has no dysphagia or odynophagia and appetite is been fine. He does think that he has had some minimal relief with the Dexilant and Carafate.  Chest x-ray done 10/22/2018 showed no acute changes EKG last week by PCP interpreted as no acute changes  Review of Systems Pertinent positive and negative review of systems were noted in the above HPI section.  All other review of systems was otherwise negative.  Outpatient Encounter Medications as of 10/24/2018  Medication Sig  . amLODipine (NORVASC) 5 MG tablet Take 1 tablet (5 mg total) by mouth daily.  Marland Kitchen aspirin 81 MG tablet Take 81 mg by mouth daily.  Marland Kitchen atorvastatin (LIPITOR) 80 MG tablet Take 1 tablet (80 mg total) by mouth daily.  . BD VEO INSULIN SYR ULTRAFINE 31G X 15/64" 1 ML MISC use as directed TO INJECT INSULIN  twice a day  . BRILINTA 90 MG TABS tablet   . dexlansoprazole (DEXILANT) 60 MG capsule Take 1 capsule (60 mg total) by mouth daily before breakfast.  . finasteride (PROSCAR) 5 MG tablet Take 1 tablet (5 mg total) by mouth daily.  . furosemide (LASIX) 20 MG tablet Take 1 tablet (20 mg total) by mouth daily.  Marland Kitchen gabapentin (NEURONTIN) 100 MG  capsule Take 2 capsules (200 mg total) by mouth at bedtime.  . insulin NPH-regular Human (HUMULIN 70/30) (70-30) 100 UNIT/ML injection 90 units with breakfast, and 55 units with supper  . lisinopril (PRINIVIL,ZESTRIL) 40 MG tablet Take 1 tablet (40 mg total) by mouth daily.  . metFORMIN (GLUCOPHAGE) 1000 MG tablet Take 1 tablet (1,000 mg total) by mouth 2 (two) times daily with a meal.  . metoprolol succinate (TOPROL-XL) 50 MG 24 hr tablet Take 1 tablet (50 mg total) by mouth daily. Take with or immediately following a meal.  . sildenafil (REVATIO) 20 MG tablet TAKE 1 TABLET BY MOUTH THREE TIMES DAILY  .  sucralfate (CARAFATE) 1 g tablet TAKE 1 TABLET(1 GRAM) BY MOUTH FOUR TIMES DAILY AT BEDTIME WITH MEALS  . [DISCONTINUED] dexlansoprazole (DEXILANT) 60 MG capsule Take 1 capsule (60 mg total) by mouth 2 (two) times daily before a meal.   No facility-administered encounter medications on file as of 10/24/2018.    No Known Allergies Patient Active Problem List   Diagnosis Date Noted  . SOB (shortness of breath) 10/22/2018  . Chest pain 10/22/2018  . Left leg cellulitis 09/19/2018  . Leg swelling 06/20/2018  . Right buttock pain 06/20/2018  . Chest pain on exertion 01/04/2018  . Diabetes ([place]) 08/26/2017  . GERD (gastroesophageal reflux disease) 07/13/2017  . Cough 08/17/2016  . Thoracic back pain 06/21/2016  . Hx of CABG 05/26/2016  . Prostate cancer ([place]) 05/26/2016  . Essential hypertension, benign 05/26/2016  . Hyperlipidemia 05/26/2016  . Cardiomyopathy ([place]) 03/25/2013  . Chronic coronary artery disease 03/25/2013  . Left ventricular systolic dysfunction 61/44/3154  . MI (myocardial infarction) ([place]) 01/09/2012   Social History   Socioeconomic History  . Marital status: Married    Spouse name: Not on file  . Number of children: 4  . Years of education: Not on file  . Highest education level: Not on file  Occupational History  . Not on file  Social Needs  . Financial resource strain: Not on file  . Food insecurity:    Worry: Not on file    Inability: Not on file  . Transportation needs:    Medical: Not on file    Non-medical: Not on file  Tobacco Use  . Smoking status: Never Smoker  . Smokeless tobacco: Never Used  Substance and Sexual Activity  . Alcohol use: No  . Drug use: No  . Sexual activity: Not on file  Lifestyle  . Physical activity:    Days per week: Not on file    Minutes per session: Not on file  . Stress: Not on file  Relationships  . Social connections:    Talks on phone: Not on file    Gets together: Not on file    Attends religious service:  Not on file    Active member of club or organization: Not on file    Attends meetings of clubs or organizations: Not on file    Relationship status: Not on file  . Intimate partner violence:    Fear of current or ex partner: Not on file    Emotionally abused: Not on file    Physically abused: Not on file    Forced sexual activity: Not on file  Other Topics Concern  . Not on file  Social History Narrative  . Not on file    Mr. Dolman family history includes Diabetes in his father.      Objective:    Vitals:  10/24/18 1031  BP: 134/86  Pulse: 78    Physical Exam; well-developed older white male, in no acute distress, pleasant, obese.  Fatigued appearing.  Height 6 foot, weight 260, BMI 35.2.  HEENT; no appreciable JVD, nontraumatic normocephalic EOMI PERRLA sclera anicteric, oral mucosa moist.  Cardiovascular ;regular rate and rhythm with S1-S2, sternal incisional scar.  Pulmonary ;clear bilaterally Abdomen, soft, nondistended, obese no palpable mass or hepatosplenomegaly bowel sounds are present.  Rectal; exam not done, Extremities; 1+ edema in the feet and ankles bilaterally.  Neuropsych; alert and oriented, grossly nonfocal mood and affect appropriate       Assessment & Plan:   #36 68 year old white male with severe three-vessel coronary artery disease status post prior CABG.  Patient had admission April 2019 to [facility] with non-STEMI and cath at that time showed the left radial coming off from mid LIMA to OM and diagonal 100% proximal SVG/PDA did not appear acute. No intervention was done plan was to maximize medical therapy  Patient now presents with 2 to 3-week history of intermittent chest pain occurring off and on throughout the day and sometimes nocturnally.  This is been associated with belching and burping.  He is unable to lie flat to sleep due to sense of shortness of breath.  Over the past week he has developed exertional dyspnea and also is having  exertional exacerbation of the chest pain/burning.  He was placed on Dexilant and Carafate by primary care for possible severe GERD.  I do not think that his symptoms are consistent with GERD or esophagitis.  I am concerned that he is having progressive angina  #2 cardiomyopathy with prior EF documented at 45% 3.  Obesity 4.  Hypertension 5.  Adult onset diabetes mellitus 6.  History of prostate CA  Plan; Patient needs to undergo urgent cardiac evaluation.  I contacted his prior cardiology practice[facility].  He was unable to be worked in there. Patient says his prior cardiologist is no longer at that practice and he is happy having cardiology evaluation in [place]. I believe he will be best served by being to the emergency room today I am concerned that his symptoms are progressing will likely require admission for further cardiac evaluation. For now he will continue Dexilant 60 mg p.o. daily can continue Carafate 1 g 3 times daily between meals We are happy to see him back as needed. Patient will be established with Dr. Loletha Carrow.  Greater than 50% of visit was spent in review of outside records, coordination of care and counseling/patient education regarding his new symptoms.  Amy Genia Harold PA-C 10/24/2018   Cc: Binnie Rail, MD

## 2018-10-25 ENCOUNTER — Other Ambulatory Visit: Payer: Self-pay

## 2018-10-25 ENCOUNTER — Inpatient Hospital Stay (HOSPITAL_COMMUNITY)
Admission: EM | Admit: 2018-10-25 | Discharge: 2018-10-27 | DRG: 280 | Disposition: A | Discharge status: Home / Self Care | Payer: Medicare Other | Attending: Internal Medicine | Admitting: Internal Medicine

## 2018-10-25 ENCOUNTER — Encounter (HOSPITAL_COMMUNITY): Payer: Self-pay | Admitting: Emergency Medicine

## 2018-10-25 ENCOUNTER — Emergency Department (HOSPITAL_COMMUNITY): Payer: Medicare Other

## 2018-10-25 DIAGNOSIS — E1165 Type 2 diabetes mellitus with hyperglycemia: Secondary | ICD-10-CM | POA: Diagnosis present

## 2018-10-25 DIAGNOSIS — Z7189 Other specified counseling: Secondary | ICD-10-CM | POA: Diagnosis not present

## 2018-10-25 DIAGNOSIS — I272 Pulmonary hypertension, unspecified: Secondary | ICD-10-CM | POA: Diagnosis present

## 2018-10-25 DIAGNOSIS — I25810 Atherosclerosis of coronary artery bypass graft(s) without angina pectoris: Secondary | ICD-10-CM | POA: Diagnosis present

## 2018-10-25 DIAGNOSIS — Z7902 Long term (current) use of antithrombotics/antiplatelets: Secondary | ICD-10-CM | POA: Diagnosis not present

## 2018-10-25 DIAGNOSIS — I5043 Acute on chronic combined systolic (congestive) and diastolic (congestive) heart failure: Secondary | ICD-10-CM | POA: Diagnosis not present

## 2018-10-25 DIAGNOSIS — I77810 Thoracic aortic ectasia: Secondary | ICD-10-CM | POA: Diagnosis present

## 2018-10-25 DIAGNOSIS — E669 Obesity, unspecified: Secondary | ICD-10-CM | POA: Diagnosis present

## 2018-10-25 DIAGNOSIS — Z6833 Body mass index (BMI) 33.0-33.9, adult: Secondary | ICD-10-CM

## 2018-10-25 DIAGNOSIS — I340 Nonrheumatic mitral (valve) insufficiency: Secondary | ICD-10-CM | POA: Diagnosis not present

## 2018-10-25 DIAGNOSIS — I255 Ischemic cardiomyopathy: Secondary | ICD-10-CM | POA: Diagnosis present

## 2018-10-25 DIAGNOSIS — I5023 Acute on chronic systolic (congestive) heart failure: Secondary | ICD-10-CM | POA: Diagnosis present

## 2018-10-25 DIAGNOSIS — I10 Essential (primary) hypertension: Secondary | ICD-10-CM | POA: Diagnosis not present

## 2018-10-25 DIAGNOSIS — I130 Hypertensive heart and chronic kidney disease with heart failure and stage 1 through stage 4 chronic kidney disease, or unspecified chronic kidney disease: Secondary | ICD-10-CM | POA: Diagnosis present

## 2018-10-25 DIAGNOSIS — I214 Non-ST elevation (NSTEMI) myocardial infarction: Secondary | ICD-10-CM | POA: Diagnosis present

## 2018-10-25 DIAGNOSIS — Z951 Presence of aortocoronary bypass graft: Secondary | ICD-10-CM | POA: Diagnosis not present

## 2018-10-25 DIAGNOSIS — E782 Mixed hyperlipidemia: Secondary | ICD-10-CM | POA: Diagnosis present

## 2018-10-25 DIAGNOSIS — E1122 Type 2 diabetes mellitus with diabetic chronic kidney disease: Secondary | ICD-10-CM | POA: Diagnosis present

## 2018-10-25 DIAGNOSIS — E785 Hyperlipidemia, unspecified: Secondary | ICD-10-CM | POA: Diagnosis present

## 2018-10-25 DIAGNOSIS — Z7982 Long term (current) use of aspirin: Secondary | ICD-10-CM | POA: Diagnosis not present

## 2018-10-25 DIAGNOSIS — D649 Anemia, unspecified: Secondary | ICD-10-CM | POA: Diagnosis present

## 2018-10-25 DIAGNOSIS — I059 Rheumatic mitral valve disease, unspecified: Secondary | ICD-10-CM | POA: Diagnosis present

## 2018-10-25 DIAGNOSIS — Z794 Long term (current) use of insulin: Secondary | ICD-10-CM | POA: Diagnosis not present

## 2018-10-25 DIAGNOSIS — I2584 Coronary atherosclerosis due to calcified coronary lesion: Secondary | ICD-10-CM

## 2018-10-25 DIAGNOSIS — N183 Chronic kidney disease, stage 3 unspecified: Secondary | ICD-10-CM

## 2018-10-25 DIAGNOSIS — I252 Old myocardial infarction: Secondary | ICD-10-CM | POA: Diagnosis not present

## 2018-10-25 DIAGNOSIS — I2510 Atherosclerotic heart disease of native coronary artery without angina pectoris: Secondary | ICD-10-CM | POA: Diagnosis present

## 2018-10-25 DIAGNOSIS — R7989 Other specified abnormal findings of blood chemistry: Secondary | ICD-10-CM | POA: Diagnosis present

## 2018-10-25 DIAGNOSIS — E1169 Type 2 diabetes mellitus with other specified complication: Secondary | ICD-10-CM | POA: Diagnosis present

## 2018-10-25 DIAGNOSIS — E7849 Other hyperlipidemia: Secondary | ICD-10-CM | POA: Diagnosis not present

## 2018-10-25 DIAGNOSIS — R778 Other specified abnormalities of plasma proteins: Secondary | ICD-10-CM

## 2018-10-25 DIAGNOSIS — E1140 Type 2 diabetes mellitus with diabetic neuropathy, unspecified: Secondary | ICD-10-CM

## 2018-10-25 DIAGNOSIS — E119 Type 2 diabetes mellitus without complications: Secondary | ICD-10-CM

## 2018-10-25 DIAGNOSIS — N400 Enlarged prostate without lower urinary tract symptoms: Secondary | ICD-10-CM | POA: Diagnosis present

## 2018-10-25 DIAGNOSIS — I361 Nonrheumatic tricuspid (valve) insufficiency: Secondary | ICD-10-CM | POA: Diagnosis not present

## 2018-10-25 DIAGNOSIS — I152 Hypertension secondary to endocrine disorders: Secondary | ICD-10-CM | POA: Diagnosis present

## 2018-10-25 DIAGNOSIS — Z833 Family history of diabetes mellitus: Secondary | ICD-10-CM

## 2018-10-25 LAB — TROPONIN I
TROPONIN I: 1.33 ng/mL — AB (ref <0.03)
Troponin I: 1.36 ng/mL (ref <0.03)

## 2018-10-25 LAB — CBC
HCT: 42 % (ref 39.0–52.0)
Hemoglobin: 13.8 g/dL (ref 13.0–17.0)
MCH: 29.1 pg (ref 26.0–34.0)
MCHC: 32.9 g/dL (ref 30.0–36.0)
MCV: 88.6 fL (ref 80.0–100.0)
Platelets: 197 10*3/uL (ref 150–400)
RBC: 4.74 MIL/uL (ref 4.22–5.81)
RDW: 14.1 % (ref 11.5–15.5)
WBC: 9.3 10*3/uL (ref 4.0–10.5)
nRBC: 0 % (ref 0.0–0.2)

## 2018-10-25 LAB — BASIC METABOLIC PANEL
Anion gap: 14 (ref 5–15)
BUN: 13 mg/dL (ref 8–23)
CO2: 22 mmol/L (ref 22–32)
Calcium: 8.9 mg/dL (ref 8.9–10.3)
Chloride: 104 mmol/L (ref 98–111)
Creatinine, Ser: 1.21 mg/dL (ref 0.61–1.24)
GFR calc Af Amer: >60 mL/min (ref >60)
GFR calc non Af Amer: >60 mL/min (ref >60)
Glucose, Bld: 97 mg/dL (ref 70–99)
Potassium: 3.9 mmol/L (ref 3.5–5.1)
Sodium: 140 mmol/L (ref 135–145)

## 2018-10-25 LAB — I-STAT TROPONIN, ED: Troponin i, poc: 1.3 ng/mL (ref 0.00–0.08)

## 2018-10-25 LAB — GLUCOSE, CAPILLARY
Glucose-Capillary: 63 mg/dL — ABNORMAL LOW (ref 70–99)
Glucose-Capillary: 88 mg/dL (ref 70–99)
Glucose-Capillary: 145 mg/dL — ABNORMAL HIGH (ref 70–99)

## 2018-10-25 LAB — CBG MONITORING, ED: Glucose-Capillary: 83 mg/dL (ref 70–99)

## 2018-10-25 LAB — HEPARIN LEVEL (UNFRACTIONATED): Heparin Unfractionated: 0.35 IU/mL (ref 0.30–0.70)

## 2018-10-25 LAB — BRAIN NATRIURETIC PEPTIDE: B Natriuretic Peptide: 598 pg/mL — ABNORMAL HIGH (ref 0.0–100.0)

## 2018-10-25 MED ORDER — FUROSEMIDE 10 MG/ML IJ SOLN
80.0000 mg | Freq: Two times a day (BID) | INTRAMUSCULAR | Status: DC
  Administered 2018-10-25 – 2018-10-27 (×4): 80 mg via INTRAVENOUS
  Filled 2018-10-25 (×4): qty 8

## 2018-10-25 MED ORDER — HEPARIN BOLUS VIA INFUSION
4000.0000 [IU] | Freq: Once | INTRAVENOUS | Status: DC
  Filled 2018-10-25: qty 4000

## 2018-10-25 MED ORDER — GABAPENTIN 100 MG PO CAPS
200.0000 mg | ORAL_CAPSULE | Freq: Every day | ORAL | Status: DC
  Administered 2018-10-25 – 2018-10-26 (×2): 200 mg via ORAL
  Filled 2018-10-25 (×2): qty 2

## 2018-10-25 MED ORDER — FUROSEMIDE 20 MG PO TABS
20.0000 mg | ORAL_TABLET | Freq: Every day | ORAL | Status: DC
  Administered 2018-10-25: 20 mg via ORAL
  Filled 2018-10-25: qty 1

## 2018-10-25 MED ORDER — AMLODIPINE BESYLATE 5 MG PO TABS
5.0000 mg | ORAL_TABLET | Freq: Every day | ORAL | Status: DC
  Administered 2018-10-25 – 2018-10-27 (×3): 5 mg via ORAL
  Filled 2018-10-25 (×3): qty 1

## 2018-10-25 MED ORDER — SODIUM CHLORIDE 0.9 % IV SOLN: 250.0000 mL | INTRAVENOUS | Status: DC | PRN

## 2018-10-25 MED ORDER — SILDENAFIL CITRATE 20 MG PO TABS
20.0000 mg | ORAL_TABLET | Freq: Three times a day (TID) | ORAL | Status: DC
  Administered 2018-10-25 – 2018-10-27 (×6): 20 mg via ORAL
  Filled 2018-10-25 (×6): qty 1

## 2018-10-25 MED ORDER — NITROGLYCERIN IN D5W 200-5 MCG/ML-% IV SOLN
0.0000 ug/min | INTRAVENOUS | Status: DC
  Administered 2018-10-25: 5 ug/min via INTRAVENOUS
  Filled 2018-10-25: qty 250

## 2018-10-25 MED ORDER — DEXTROSE 50 % IV SOLN
INTRAVENOUS | Status: AC
  Administered 2018-10-25: 25 mL
  Filled 2018-10-25: qty 50

## 2018-10-25 MED ORDER — ATORVASTATIN CALCIUM 80 MG PO TABS
80.0000 mg | ORAL_TABLET | Freq: Every day | ORAL | Status: DC
  Administered 2018-10-25 – 2018-10-26 (×2): 80 mg via ORAL
  Filled 2018-10-25 (×2): qty 1

## 2018-10-25 MED ORDER — SODIUM CHLORIDE 0.9% FLUSH
3.0000 mL | Freq: Two times a day (BID) | INTRAVENOUS | Status: DC
  Administered 2018-10-25 – 2018-10-26 (×2): 3 mL via INTRAVENOUS

## 2018-10-25 MED ORDER — SODIUM CHLORIDE 0.9% FLUSH: 3.0000 mL | Freq: Once | INTRAVENOUS | Status: DC

## 2018-10-25 MED ORDER — SODIUM CHLORIDE 0.9% FLUSH: 3.0000 mL | INTRAVENOUS | Status: DC | PRN

## 2018-10-25 MED ORDER — HEPARIN SODIUM (PORCINE) 5000 UNIT/ML IJ SOLN
4000.0000 [IU] | Freq: Once | INTRAMUSCULAR | Status: AC
  Administered 2018-10-25: 4000 [IU] via INTRAVENOUS
  Filled 2018-10-25: qty 1

## 2018-10-25 MED ORDER — TICAGRELOR 90 MG PO TABS
90.0000 mg | ORAL_TABLET | Freq: Two times a day (BID) | ORAL | Status: DC
  Administered 2018-10-25 – 2018-10-27 (×4): 90 mg via ORAL
  Filled 2018-10-25 (×4): qty 1

## 2018-10-25 MED ORDER — ONDANSETRON HCL 4 MG/2ML IJ SOLN: 4.0000 mg | Freq: Four times a day (QID) | INTRAMUSCULAR | Status: DC | PRN

## 2018-10-25 MED ORDER — ONDANSETRON HCL 4 MG PO TABS: 4.0000 mg | ORAL_TABLET | Freq: Four times a day (QID) | ORAL | Status: DC | PRN

## 2018-10-25 MED ORDER — POTASSIUM CHLORIDE CRYS ER 20 MEQ PO TBCR: 40.0000 meq | EXTENDED_RELEASE_TABLET | Freq: Once | ORAL | Status: DC

## 2018-10-25 MED ORDER — POTASSIUM CHLORIDE CRYS ER 20 MEQ PO TBCR: 40.0000 meq | EXTENDED_RELEASE_TABLET | Freq: Two times a day (BID) | ORAL | Status: DC

## 2018-10-25 MED ORDER — FINASTERIDE 5 MG PO TABS
5.0000 mg | ORAL_TABLET | Freq: Every day | ORAL | Status: DC
  Administered 2018-10-25 – 2018-10-27 (×3): 5 mg via ORAL
  Filled 2018-10-25 (×3): qty 1

## 2018-10-25 MED ORDER — INSULIN ASPART 100 UNIT/ML ~~LOC~~ SOLN
0.0000 [IU] | SUBCUTANEOUS | Status: DC
  Administered 2018-10-25: 1 [IU] via SUBCUTANEOUS
  Administered 2018-10-26 – 2018-10-27 (×2): 2 [IU] via SUBCUTANEOUS

## 2018-10-25 MED ORDER — POTASSIUM CHLORIDE CRYS ER 20 MEQ PO TBCR
40.0000 meq | EXTENDED_RELEASE_TABLET | Freq: Two times a day (BID) | ORAL | Status: DC
  Administered 2018-10-25 – 2018-10-27 (×4): 40 meq via ORAL
  Filled 2018-10-25 (×4): qty 2

## 2018-10-25 MED ORDER — SPIRONOLACTONE 12.5 MG HALF TABLET
12.5000 mg | ORAL_TABLET | Freq: Every day | ORAL | Status: DC
  Administered 2018-10-25 – 2018-10-26 (×2): 12.5 mg via ORAL
  Filled 2018-10-25 (×3): qty 1

## 2018-10-25 MED ORDER — INSULIN GLARGINE 100 UNIT/ML ~~LOC~~ SOLN
20.0000 [IU] | Freq: Every day | SUBCUTANEOUS | Status: DC
  Administered 2018-10-25 – 2018-10-26 (×2): 20 [IU] via SUBCUTANEOUS
  Filled 2018-10-25 (×2): qty 0.2

## 2018-10-25 MED ORDER — HEPARIN (PORCINE) 25000 UT/250ML-% IV SOLN
1500.0000 [IU]/h | INTRAVENOUS | Status: DC
  Administered 2018-10-25: 1200 [IU]/h via INTRAVENOUS
  Administered 2018-10-26: 1500 [IU]/h via INTRAVENOUS
  Filled 2018-10-25 (×2): qty 250

## 2018-10-25 MED ORDER — ASPIRIN 81 MG PO CHEW
81.0000 mg | CHEWABLE_TABLET | ORAL | Status: AC
  Administered 2018-10-26: 81 mg via ORAL
  Filled 2018-10-25: qty 1

## 2018-10-25 MED ORDER — NITROGLYCERIN 2 % TD OINT
2.0000 [in_us] | TOPICAL_OINTMENT | Freq: Once | TRANSDERMAL | Status: AC
  Administered 2018-10-25: 2 [in_us] via TOPICAL
  Filled 2018-10-25: qty 1

## 2018-10-25 MED ORDER — FUROSEMIDE 10 MG/ML IJ SOLN: 40.0000 mg | Freq: Once | INTRAMUSCULAR | Status: DC

## 2018-10-25 MED ORDER — SODIUM CHLORIDE 0.9 % IV SOLN
INTRAVENOUS | Status: DC
  Administered 2018-10-26: 06:00:00 via INTRAVENOUS

## 2018-10-25 MED ORDER — ACETAMINOPHEN 650 MG RE SUPP: 650.0000 mg | Freq: Four times a day (QID) | RECTAL | Status: DC | PRN

## 2018-10-25 MED ORDER — ACETAMINOPHEN 325 MG PO TABS: 650.0000 mg | ORAL_TABLET | Freq: Four times a day (QID) | ORAL | Status: DC | PRN

## 2018-10-25 MED ORDER — METOPROLOL SUCCINATE ER 50 MG PO TB24
50.0000 mg | ORAL_TABLET | Freq: Every day | ORAL | Status: DC
  Administered 2018-10-25 – 2018-10-27 (×3): 50 mg via ORAL
  Filled 2018-10-25 (×3): qty 1

## 2018-10-25 MED ORDER — ASPIRIN 81 MG PO CHEW
324.0000 mg | CHEWABLE_TABLET | Freq: Once | ORAL | Status: AC
  Administered 2018-10-25: 324 mg via ORAL
  Filled 2018-10-25: qty 4

## 2018-10-25 MED ORDER — LISINOPRIL 40 MG PO TABS
40.0000 mg | ORAL_TABLET | Freq: Every day | ORAL | Status: DC
  Administered 2018-10-25 – 2018-10-27 (×3): 40 mg via ORAL
  Filled 2018-10-25 (×4): qty 1

## 2018-10-25 MED ORDER — METOPROLOL TARTRATE 5 MG/5ML IV SOLN
5.0000 mg | Freq: Once | INTRAVENOUS | Status: AC
  Administered 2018-10-25: 5 mg via INTRAVENOUS
  Filled 2018-10-25: qty 5

## 2018-10-25 NOTE — H&P (View-Only) (Signed)
Cardiology Consultation:   Patient ID: BRALON ANTKOWIAK; [id number]; [birth year]   Admit date: 10/25/2018 Date of Consult: 10/25/2018  Primary Care Provider: Binnie Rail, MD Primary Cardiologist: was Dr Denzil Magnuson, [facility] Primary Electrophysiologist:  None   Patient Profile:   Russell Trevino is a 68 y.o. male with a hx of NSTEMI>>CABG 2006 w/ LIMA-LAD, L radial-OM-D1 and SVG-PDA. ICM w/ EF now 50-55%, DM2, HTN, HLD, NSTEMI w/ CHF 12/2017 who is being seen today for the evaluation of SOB and chest pain at the request of Dr Maylene Roes.  He lives in [place], but has family and friends here. Has been here about a week ago to work on a friend's house.   History of Present Illness:   Russell Trevino was last seen by cardiology at d/c after his NSTEMI w/ CHF exacerbation, med rx, hospitalized 04/25-04/27/2019.   He reports increasing DOE x 6 weeks. LE edema has been intermittent for a long time, but became constant at some point recently. Denies orthopnea or PND.  Heartburn would start after meals or with exertion. It would improve w/ Prilosec 60 mg or with rest. Sx would resolve in time, variable time frame depending on how much he ate. Talked w/ PCP>>rx called in, no help>>called back and another rx called in>>Dexilant helped, Carafate also called in, these rx helped w/ eating.   However, he still was getting heartburn with exertion, never tried nitro for it.   About a week ago, DOE became much worse, could not go up 6 steps w/out gasping for breath.  Per his daughter, he was very short of breath with conversation, could not say 2 or 3 words before he had to stop.    He went to see PCP on 02/10. Was referred to GI. GI MD suggested he call cardiologist, Dr Tamala Julian is no longer at [facility]>>could not get appt for a couple of weeks. Since no urgent appt>>GI MD recommended he go to ER>>he did so this am.   Has been on Brilinta for a while, went without it for a week (2 weeks ago) because pharmacy  did not have it, restarted it once the pharmacy got it in.    Past Medical History:  Diagnosis Date  . BPH (benign prostatic hypertrophy)   . Cardiomyopathy, ischemic   . Coronary artery disease    at [facility], [place] 2009 (GRAFTS PATENT), cath 05/2018 w/ SVG-PDA 100%>>med rx  . Diabetes mellitus, type 2 ([place])   . Frequency of urination   . Gross hematuria   . History of CHF (congestive heart failure) 2007  . History of kidney stones   . History of myocardial infarction 2006, 2019   2006>>CABG, 2019 cath w/ med rx  . History of urinary retention   . Hyperlipidemia   . Nocturia   . Renal calculi    BILATERAL PER CT SCAN--  NON-OBSTRUCTIVE  . S/P CABG x 4 2006    Past Surgical History:  Procedure Laterality Date  . CARDIAC CATHETERIZATION  01/17/2008   GRAFTS PATENT / EF 40%,  DR SANTOS ([facility])  . CORONARY ARTERY BYPASS GRAFT  04/2005   LIMA-LAD, L radial-OM-D1 and SVG-PDA  . CYSTO / RIGHT URETERAL STENT PLACEMENT  10-08-1999  . CYSTO/  REMOVAL BLADDER STONES  01-14-2010  . CYSTOSCOPY  10/16/2012   Procedure: CYSTOSCOPY;  Surgeon: Malka So, MD;  Location: [facility];  Service: Urology;  Laterality: N/A;  Cystoscopy with Clot Evacuation and fulguration bladder neck.  Marland Kitchen  EXTRACORPOREAL SHOCK WAVE LITHOTRIPSY  10-15-2009   RIGHT  . KNEE ARTHROSCOPY  1978   RIGHT  . LEFT SHOULDER SURGERY   2002  . LUMBAR FUSION  09-10-2008   L4 - L5  . PERCUTANEOUS NEPHROSTOLITHOTOMY  1979  . PROSTATE BIOPSY  10/16/2012   Procedure: PROSTATE BIOPSY;  Surgeon: Malka So, MD;  Location: [facility];  Service: Urology;  Laterality: N/A;  Through ultrasound.  Marland Kitchen SHOULDER ARTHROSCOPY W/ SUBACROMIAL DECOMPRESSION AND DISTAL CLAVICLE EXCISION  10-11-2004   AND PARTIAL ROTATOR CUFF REPAIR  . TRANSTHORACIC ECHOCARDIOGRAM  63/09/6008   LV SYSTOLIC FUNCTION MILDLY REDUCED/ EF 48%/ LV FILLING PATTERN  IS IMPAIRED/ HYPOKINESIS IN THE MID AND BASALAR SEPTUM & ANTEROSEPTUM  .  TURP VAPORIZATION  01-19-2010   BPH W/ BOO  . URETEROLITHOTOMY  1976     Prior to Admission medications   Medication Sig Start Date End Date Taking? Authorizing Provider  amLODipine (NORVASC) 5 MG tablet Take 1 tablet (5 mg total) by mouth daily. Patient taking differently: Take 5 mg by mouth daily at 12 noon.  09/19/18  Yes Burns, Claudina Lick, MD  aspirin 81 MG tablet Take 81 mg by mouth daily at 12 noon.    Yes [provider]  atorvastatin (LIPITOR) 80 MG tablet Take 1 tablet (80 mg total) by mouth daily. Patient taking differently: Take 80 mg by mouth daily at 12 noon.  09/19/18  Yes Burns, Claudina Lick, MD  BRILINTA 90 MG TABS tablet Take 90 mg by mouth daily at 12 noon.  05/28/18  Yes [provider]  carvedilol (COREG) 6.25 MG tablet Take 6.25 mg by mouth daily at 12 noon.    Yes [provider]  dexlansoprazole (DEXILANT) 60 MG capsule Take 1 capsule (60 mg total) by mouth daily before breakfast. Patient taking differently: Take 60 mg by mouth daily at 12 noon. 12 noon 10/24/18  Yes Esterwood, Amy S, PA-C  finasteride (PROSCAR) 5 MG tablet Take 1 tablet (5 mg total) by mouth daily. Patient taking differently: Take 5 mg by mouth daily at 12 noon. 12 noon 09/19/18  Yes Burns, Claudina Lick, MD  furosemide (LASIX) 20 MG tablet Take 1 tablet (20 mg total) by mouth daily. Patient taking differently: Take 20 mg by mouth daily at 12 noon. 12 noon 09/19/18  Yes Burns, Claudina Lick, MD  gabapentin (NEURONTIN) 100 MG capsule Take 2 capsules (200 mg total) by mouth at bedtime. Patient taking differently: Take 100 mg by mouth daily at 12 noon. 12 noon 09/19/18  Yes Burns, Claudina Lick, MD  insulin NPH-regular Human (HUMULIN 70/30) (70-30) 100 UNIT/ML injection 90 units with breakfast, and 55 units with supper Patient taking differently: Inject 40-70 Units into the skin See admin instructions. 70 mg in the morning and 40 mg in the evening 11/03/17  Yes Renato Shin, MD  lisinopril (PRINIVIL,ZESTRIL) 40 MG  tablet Take 1 tablet (40 mg total) by mouth daily. Patient taking differently: Take 40 mg by mouth daily at 12 noon. 12 noon 09/19/18  Yes Burns, Claudina Lick, MD  metFORMIN (GLUCOPHAGE) 1000 MG tablet Take 1 tablet (1,000 mg total) by mouth 2 (two) times daily with a meal. Patient taking differently: Take 1,000 mg by mouth daily at 12 noon. 12 noon 07/14/17  Yes Burns, Claudina Lick, MD  metoprolol succinate (TOPROL-XL) 50 MG 24 hr tablet Take 1 tablet (50 mg total) by mouth daily. Take with or immediately following a meal. Patient taking differently: Take 50  mg by mouth daily at 12 noon. Take with or immediately following a meal. 12 noon 09/19/18  Yes Burns, Claudina Lick, MD  nitroGLYCERIN (NITROSTAT) 0.4 MG SL tablet Place 0.4 mg under the tongue as needed. 01/06/18  Yes [provider]  sildenafil (REVATIO) 20 MG tablet TAKE 1 TABLET BY MOUTH THREE TIMES DAILY Patient taking differently: Take 20 mg by mouth as needed.  07/10/18  Yes Burns, Claudina Lick, MD  sucralfate (CARAFATE) 1 g tablet TAKE 1 TABLET(1 GRAM) BY MOUTH FOUR TIMES DAILY AT BEDTIME WITH MEALS Patient taking differently: Take 1 g by mouth 3 (three) times daily. Last dose at bedtime 10/22/18  Yes Burns, Claudina Lick, MD  BD VEO INSULIN SYR ULTRAFINE 31G X 15/64" 1 ML MISC use as directed TO INJECT INSULIN  twice a day 06/06/17   Binnie Rail, MD    Inpatient Medications: Scheduled Meds: . amLODipine  5 mg Oral Daily  . atorvastatin  80 mg Oral q1800  . finasteride  5 mg Oral Daily  . furosemide  20 mg Oral Daily  . gabapentin  200 mg Oral QHS  . heparin  4,000 Units Intravenous Once  . insulin aspart  0-9 Units Subcutaneous Q4H  . insulin glargine  20 Units Subcutaneous QHS  . lisinopril  40 mg Oral Daily  . metoprolol succinate  50 mg Oral Daily  . sildenafil  20 mg Oral TID  . sodium chloride flush  3 mL Intravenous Once  . ticagrelor  90 mg Oral BID   Continuous Infusions: . heparin 1,200 Units/hr (10/25/18 1009)  . nitroGLYCERIN 10  mcg/min (10/25/18 1247)   PRN Meds: acetaminophen **OR** acetaminophen, ondansetron **OR** ondansetron (ZOFRAN) IV  Allergies:   No Known Allergies  Social History:   Social History   Socioeconomic History  . Marital status: Married    Spouse name: Not on file  . Number of children: 4  . Years of education: Not on file  . Highest education level: Not on file  Occupational History  . Not on file  Social Needs  . Financial resource strain: Not on file  . Food insecurity:    Worry: Not on file    Inability: Not on file  . Transportation needs:    Medical: Not on file    Non-medical: Not on file  Tobacco Use  . Smoking status: Never Smoker  . Smokeless tobacco: Never Used  Substance and Sexual Activity  . Alcohol use: No  . Drug use: No  . Sexual activity: Not on file  Lifestyle  . Physical activity:    Days per week: Not on file    Minutes per session: Not on file  . Stress: Not on file  Relationships  . Social connections:    Talks on phone: Not on file    Gets together: Not on file    Attends religious service: Not on file    Active member of club or organization: Not on file    Attends meetings of clubs or organizations: Not on file    Relationship status: Not on file  . Intimate partner violence:    Fear of current or ex partner: Not on file    Emotionally abused: Not on file    Physically abused: Not on file    Forced sexual activity: Not on file  Other Topics Concern  . Not on file  Social History Narrative   Lives in [place], [place], has family and friends in [place].  Family History:   Family History  Problem Relation Age of Onset  . Diabetes Father    Family Status:  Family Status  Relation Name Status  . Mother  Deceased  . Father  Deceased    ROS:  Please see the history of present illness.  All other ROS reviewed and negative.     Physical Exam/Data:   Vitals:   10/25/18 1315 10/25/18 1330 10/25/18 1345 10/25/18 1433  BP: 133/60 (!)  137/57 123/68 128/63  Pulse: 68 66 65 67  Resp: 20 19 15    Temp:    97.6 F (36.4 C)  TempSrc:    Oral  SpO2: 98% 98% 98% 97%  Weight:    115.3 kg  Height:    6' (1.829 m)    Intake/Output Summary (Last 24 hours) at 10/25/2018 1730 Last data filed at 10/25/2018 1500 Gross per 24 hour  Intake 64.87 ml  Output -  Net 64.87 ml   Filed Weights   10/25/18 0921 10/25/18 1433  Weight: 113.4 kg 115.3 kg   Body mass index is 34.46 kg/m.  General:  Well nourished, well developed, male in no acute distress HEENT: normal Lymph: no adenopathy Neck: no JVD seen, difficult to assess 2nd body habitus Endocrine:  No thryomegaly Vascular: No carotid bruits; 4/4 extremity pulses 2+, without bruits  Cardiac:  normal S1, S2; RRR; no murmur  Lungs:  Decreased BS bases auscultation bilaterally, no wheezing, rhonchi or rales  Abd: soft, nontender, no hepatomegaly  Ext: R>L trace-1+ LE edema Musculoskeletal:  No deformities, BUE and BLE strength normal and equal Skin: warm and dry  Neuro:  CNs 2-12 intact, no focal abnormalities noted Psych:  Normal affect   EKG:  The EKG was personally reviewed and demonstrates:  02/13, SR, HR 78, NS IVCD w/ QRS duration 125 ms, QRS duration was 106 ms 12/2017 and was 112 ms on 10/22/2018 Telemetry:  Telemetry was personally reviewed and demonstrates:  SR, occ multifocal PVCs and pairs  Relevant CV Studies:  ECHO: 01/05/2018 SUMMARY  The left ventricle is moderately dilated.  There is normal left ventricular wall thickness.    Left ventricular systolic function is borderline reduced.  LV ejection fraction = 45-50%.  There are regional wall motion abnormalities as specified below.  The right ventricle is normal in size and function.  There is no significant valvular stenosis or regurgitation  There is no pericardial effusion.  IVC size was mildly dilated.  Borderline dilated ascending aorta.  Probably no significant change in comparison with  the prior study of   01/09/2012.  -  FINDINGS:    LEFT VENTRICLE  The left ventricle is moderately dilated. There is normal left ventricular   wall thickness. Left ventricular systolic function is borderline reduced. LV   ejection fraction = 45-50%. Left ventricular filling pattern is prolonged   relaxation. There are regional wall motion abnormalities as specified below.   There is mid LV septal wall mild hypokinesis. There is mid LV anterior septal   hypokinesis. There is basal LV anterior wall mild hypokinesis.  -    RIGHT VENTRICLE  The right ventricle is normal in size and function.    LEFT ATRIUM  The left atrial size is normal.    RIGHT ATRIUM    Right atrial size is normal.  -  AORTIC VALVE  There is aortic valve sclerosis. The aortic valve is trileaflet. The aortic   valve opens well. There is no aortic stenosis. There is no  aortic   regurgitation.  -  MITRAL VALVE  The mitral valve is normal in structure and function. There is trace mitral   regurgitation.  -  TRICUSPID VALVE  The tricuspid valve is normal in structure and function. There is trace   tricuspid regurgitation.  -  PULMONIC VALVE  The pulmonic valve is not well visualized. Trace pulmonic valvular   regurgitation.  -  ARTERIES  The aortic root is normal size. Borderline dilated ascending aorta.  -  VENOUS  Pulmonary venous flow pattern is normal. IVC size was mildly dilated.  -  EFFUSION  There is no pericardial effusion.  -  -  MMode/2D Measurements & Calculations  IVSd: 0.62 cm  LVIDd: 6.5 cm  LVPWd: 0.70 cm  LVIDs: 5.6 cm  LA dim: 4.2 cm  Ao root: 2.9 cm  EDV(MOD-sp4): 89.3 ml  ESV(MOD-sp4): 23.2 ml  EDV(MOD-sp2): 145.3 ml  ESV(MOD-sp2): 73.1 ml  asc Aorta Diam: 3.6 cm  LVOT diam: 1.9 cm  SV(MOD-sp4): 66.1 ml  SI(MOD-sp4): 27.9 ml/m2  IVC 1: 1.9 cm  LA area A2: 17.5 cm2  LA area A4: 19.2 cm2  LA vol: 47.4  ml  LA vol index: 20.0 ml/m2  RA area A4: 11.8 cm2  Doppler Measurements & Calculations  MV E max vel: 107.4 cm/sec  MV A max vel: 54.8 cm/sec  MV E/A: 2.0  Med Peak E' Vel: 2.6 cm/sec  Lat Peak E' Vel: 8.0 cm/sec  E/Lat E`: 13.4  E/Med E`: 40.6  MV dec time: 0.22 sec  SV(LVOT): 67.7 ml  Ao V2 max: 144.1 cm/sec  Ao max PG: 8.3 mmHg  Ao V2 mean: 98.6 cm/sec  Ao mean PG: 4.5 mmHg  Ao V2 VTI: 27.7 cm  AVA (VTI): 2.4 cm2  LV V1 VTI: 24.1 cm  AS Dimensionless Index (VTI): 0.87  AVAi(VTI) cm^2/m^2: 1.0 cm2  SV index(LVOT): 28.6 ml/m2   CATH: 01/05/2018 In view of history of left radial harvest for CABG - access was via right  femoral artery. Ultrasound guided micro puncture technique used to insert 4F sheath via  right femoral artery. Patient has a lot of pannus and sheath is just above the bifurcation.  Native coronaries: 100% prox LAD 100% mid CX 100% prox RCA Grafts: Patent LIMA-LAD Patent left radial from coming off mid LIMA to OM and diagonal  100% proximal SVG-PDA - does not appear acute. Distal RCA/PDA fills via L-R collaterals. Did not see any region that would benefit from PCI DES Stopped at diagnostic.    Laboratory Data:  Chemistry Recent Labs  Lab 10/22/18 1149 10/25/18 0925  NA 137 140  K 3.9 3.9  CL 102 104  CO2 28 22  GLUCOSE 246* 97  BUN 22 13  CREATININE 1.26 1.21  CALCIUM 9.0 8.9  GFRNONAA  --  >60  GFRAA  --  >60  ANIONGAP  --  14    Lab Results  Component Value Date   ALT 13 10/22/2018   AST 23 10/22/2018   ALKPHOS 81 10/22/2018   BILITOT 1.8 (H) 10/22/2018   Hematology Recent Labs  Lab 10/22/18 1149 10/25/18 0925  WBC 10.0 9.3  RBC 4.46 4.74  HGB 13.3 13.8  HCT 39.0 42.0  MCV 87.4 88.6  MCH  --  29.1  MCHC 34.2 32.9  RDW 14.5 14.1  PLT 183.0 197   Cardiac Enzymes Recent Labs  Lab 10/25/18 1442  TROPONINI 1.33*    Recent Labs  Lab 10/25/18 0931  TROPIPOC 1.30*  BNP Recent Labs  Lab  10/25/18 0925  BNP 598.0*    TSH:  Lab Results  Component Value Date   TSH 1.86 07/13/2017   Lipids: Lab Results  Component Value Date   CHOL 227 (H) 10/22/2018   HDL 38.20 (L) 10/22/2018   LDLCALC 150 (H) 10/22/2018   LDLDIRECT 155.0 07/13/2017   TRIG 192.0 (H) 10/22/2018   CHOLHDL 6 10/22/2018   HgbA1c: Lab Results  Component Value Date   HGBA1C 9.9 (H) 10/22/2018   Magnesium: No results found for: MG   Radiology/Studies:  Dg Chest 2 View  Result Date: 10/25/2018 CLINICAL DATA:  Dry cough for 1 week. EXAM: CHEST - 2 VIEW COMPARISON:  10/22/2018 FINDINGS: Stable postsurgical changes from CABG. Cardiomediastinal silhouette is normal. Mediastinal contours appear intact. There is no evidence of focal airspace consolidation, pleural effusion or pneumothorax. Osseous structures are without acute abnormality. Soft tissues are grossly normal. IMPRESSION: No active cardiopulmonary disease. Electronically Signed   By: Fidela Salisbury M.D.   On: 10/25/2018 10:08   Dg Chest 2 View  Result Date: 10/22/2018 CLINICAL DATA:  Shortness of breath on exertion for 4 days. EXAM: CHEST - 2 VIEW COMPARISON:  January 04, 2018 and August 17, 2016 FINDINGS: No pneumothorax. Stable cardiomegaly. The hila and mediastinum are unchanged. Chronic scar atelectasis at the left base. No new infiltrate, nodule, or mass. IMPRESSION: No acute interval change. Electronically Signed   By: Dorise Bullion III M.D   On: 10/22/2018 15:12    Assessment and Plan:   1. NSTEMI:  -He received 81 mg x 4 aspirin in the emergency room plus IV beta-blocker and nitrates. -He is currently resting comfortably. -We will order echocardiogram - The risks and benefits of a cardiac catheterization including, but not limited to, death, stroke, MI, kidney damage and bleeding were discussed with the patient who indicates understanding and agrees to proceed.  -he is on the board for tomorrow, with Dr. [place]ique, orders written -At  his last cath, they went from the right groin, left radial was not used because his left radial was used for his bypass surgery  2.  Volume overload, likely acute on chronic combined systolic and diastolic CHF: -His BNP is elevated, he has lower extremity edema and dyspnea on exertion. -At the time of his non-STEMI 12/2017, he also had some problems with volume overload and required IV Lasix.  His EF at that time was 45-50%.  He was discharged on Lasix 40 mg twice daily -When he followed up with Dr. Quay Burow 06/2018, he was not on any Lasix, she started him on Lasix 20 mg daily for lower extremity edema - He has not been monitoring the sodium in his diet.  The amount of liquids that he drinks varies greatly depending on whether he is working outside in the heat or not. -He has not been doing daily weights but can start -Will give 1 dose of Lasix 40 mg IV with 40 mEq of potassium -Check echo -Check BMET in a.m.  3.  Hyperlipidemia:  -He has been on high-dose statin and states he has not been missing many doses - His cholesterol is still extremely high, he will need to be followed closely for this and may need PCSK9 inhibitor  Otherwise, per IM Principal Problem:   NSTEMI (non-ST elevated myocardial infarction) ([place]) Active Problems:   Hx of CABG   Chronic coronary artery disease   Essential hypertension, benign   Hyperlipidemia   Diabetes ([place])   For  questions or updates, please contact [place] Please consult [email or web address] for contact info under Cardiology/STEMI.   Signed, Rosaria Ferries, PA-C  10/25/2018 5:30 PM  Patient seen and examined with the above-signed Advanced Practice Provider and/or Housestaff. I personally reviewed laboratory data, imaging studies and relevant notes. I independently examined the patient and formulated the important aspects of the plan. I have edited the note to reflect any of my changes or salient points. I have personally discussed the plan with the  patient and/or family.  68 y/o obese male with h/o CAD s/p CABG x 4 in 2006. Underwent cath in 4/19 at [facility] in 4/19 after PCP checked a troponin at routine office visit and found it to be elevated. No symptoms at that time. Cath as above with severe native CAD and all grafts patent except chronic occlusion of SVG-PDA. EF 45-50%.   Did well since that time until about two weeks ago when he developed progressive heartburn, chest tightness, DOE, orthopnea and PND. (previous angina was chest and arm pain). Not having arm pain recently. Saw GI and now came to ER. BNP 598. Trop 1.3. ECG NSR with incomplete LBBB. CXR ok   On exam comfortable. Thick neck with jvp to jaw Cor RRR Lungs clear Ab obese NT ND EXT 1-2+ ankle edema  Given recent cath with stable CAD and symptom complex, I suspect the main issue here is HF and not ACS but accelerating heartburn with elevated troponin certainly a concern though very different from previous angina. Will start IV lasix. Check echo proceed with R/L cath tomorrow. Will need outpatient sleep study.   Glori Bickers, MD  7:38 PM

## 2018-10-25 NOTE — ED Notes (Signed)
Pt with relief of symptoms, VSS

## 2018-10-25 NOTE — Progress Notes (Signed)
ANTICOAGULATION CONSULT NOTE - Initial Consult  Pharmacy Consult for Heparin Indication: chest pain/ACS  No Known Allergies  Patient Measurements: Height: 6' (182.9 cm) Weight: 250 lb (113.4 kg) IBW/kg (Calculated) : 77.6 Heparin Dosing Weight: 101.9  Vital Signs: BP: 181/91 (02/13 0930) Pulse Rate: 79 (02/13 0920)  Labs: Recent Labs    10/22/18 1149 10/25/18 0925  HGB 13.3 13.8  HCT 39.0 42.0  PLT 183.0 197  CREATININE 1.26  --     Estimated Creatinine Clearance: 72.9 mL/min (by C-G formula based on SCr of 1.26 mg/dL).   Medical History: Past Medical History:  Diagnosis Date  . BPH (benign prostatic hypertrophy)   . Cardiomyopathy, ischemic   . Coronary artery disease CARDIOLOGIST- DR Frederico Hamman  ([facility])--  LAST VISIT APRIL 2013--  REQUEST FOR RECORD FAXED   LAST CARDIAC CATH 2009 (GRAFTS PATENT)/ LAST STRESS TEST 2010-  PER PT OK  . Diabetes mellitus, type 2 ([place])   . Frequency of urination   . Gross hematuria   . History of CHF (congestive heart failure) 2007  . History of kidney stones   . History of myocardial infarction 2006 --  NON ST ELEVATED  MI  S/P CABG  , [facility]  . History of urinary retention   . Hyperlipidemia   . Nocturia   . Renal calculi BILATERAL PER CT SCAN--  NON-OBSTRUCTIVE  . S/P CABG x 4 2006  . Urgency of urination     Assessment: Russell Trevino with exertional CP and dyspnea Pharmacy consulted for heparin drip for ACS. Patient only on Brilinta PTA.  Goal of Therapy:  Heparin level 0.3-0.7 units/ml Monitor platelets by anticoagulation protocol: Yes   Plan:  Give 4000 units bolus x 1 Start heparin infusion at 1200 units/hr Check anti-Xa level in 8 hours and daily while on heparin Continue to monitor H&H and platelets  Alanda Slim, PharmD, [facility] Clinical Pharmacist Please see AMION for all Pharmacists' Contact Phone Numbers 10/25/2018, 9:55 AM

## 2018-10-25 NOTE — ED Notes (Signed)
Pt c/o increased SOB and heart burn "like when I came in". Denies CP. Sats 96-97% RA. LSC bilat. VSS. Dr. Maylene Roes made aware.

## 2018-10-25 NOTE — Progress Notes (Signed)
[facility] for Heparin Indication: chest pain/ACS  No Known Allergies  Patient Measurements: Height: 6' (182.9 cm) Weight: 254 lb 1.6 oz (115.3 kg) IBW/kg (Calculated) : 77.6 Heparin Dosing Weight: 102 kg  Vital Signs: Temp: 97.6 F (36.4 C) (02/13 1433) Temp Source: Oral (02/13 1433) BP: 128/63 (02/13 1433) Pulse Rate: 67 (02/13 1433)  Labs: Recent Labs    10/25/18 0925 10/25/18 1442 10/25/18 1806  HGB 13.8  --   --   HCT 42.0  --   --   PLT 197  --   --   HEPARINUNFRC  --   --  0.35  CREATININE 1.21  --   --   TROPONINI  --  1.33*  --     Estimated Creatinine Clearance: 76.6 mL/min (by C-G formula based on SCr of 1.21 mg/dL).   Assessment: 25 YOM with exertional CP and dyspnea.  Pharmacy consulted to dose heparin for ACS.  Heparin level is therapeutic; no bleeding reported.  Goal of Therapy:  Heparin level 0.3-0.7 units/ml Monitor platelets by anticoagulation protocol: Yes   Plan:  Increase heparin gtt to 1300 units/hr F/U AM labs   Adden Strout D. Mina Marble, PharmD, BCPS, [place] 10/25/2018, 7:36 PM

## 2018-10-25 NOTE — Consult Note (Addendum)
Cardiology Consultation:   Patient ID: Russell Trevino; [id number]; 1951-03-10   Admit date: 10/25/2018 Date of Consult: 10/25/2018  Primary Care Provider: Binnie Rail, MD Primary Cardiologist: was Russell Trevino, [facility] Primary Electrophysiologist:  None   Patient Profile:   Russell Trevino is a 68 y.o. male with a hx of NSTEMI>>CABG 2006 w/ LIMA-LAD, L radial-OM-D1 and SVG-PDA. ICM w/ EF now 50-55%, DM2, HTN, HLD, NSTEMI w/ CHF 12/2017 who is being seen today for the evaluation of SOB and chest pain at the request of Russell Trevino.  He lives in [place], but has family and friends here. Has been here about a week ago to work on a friend's house.   History of Present Illness:   Russell Trevino was last seen by cardiology at d/c after his NSTEMI w/ CHF exacerbation, med rx, hospitalized 04/25-04/27/2019.   He reports increasing DOE x 6 weeks. LE edema has been intermittent for a long time, but became constant at some point recently. Denies orthopnea or PND.  Heartburn would start after meals or with exertion. It would improve w/ Prilosec 60 mg or with rest. Sx would resolve in time, variable time frame depending on how much he ate. Talked w/ PCP>>rx called in, no help>>called back and another rx called in>>Dexilant helped, Carafate also called in, these rx helped w/ eating.   However, he still was getting heartburn with exertion, never tried nitro for it.   About a week ago, DOE became much worse, could not go up 6 steps w/out gasping for breath.  Per his daughter, he was very short of breath with conversation, could not say 2 or 3 words before he had to stop.    He went to see PCP on 02/10. Was referred to GI. GI MD suggested he call cardiologist, Russell Trevino is no longer at [facility]>>could not get appt for a couple of weeks. Since no urgent appt>>GI MD recommended he go to ER>>he did Trevino this am.   Has been on Brilinta for a while, went without it for a week (2 weeks ago) because pharmacy  did not have it, restarted it once the pharmacy got it in.    Past Medical History:  Diagnosis Date  . BPH (benign prostatic hypertrophy)   . Cardiomyopathy, ischemic   . Coronary artery disease    at [facility], [place] 2009 (GRAFTS PATENT), cath 05/2018 w/ SVG-PDA 100%>>med rx  . Diabetes mellitus, type 2 ([place])   . Frequency of urination   . Gross hematuria   . History of CHF (congestive heart failure) 2007  . History of kidney stones   . History of myocardial infarction 2006, 2019   2006>>CABG, 2019 cath w/ med rx  . History of urinary retention   . Hyperlipidemia   . Nocturia   . Renal calculi    BILATERAL PER CT SCAN--  NON-OBSTRUCTIVE  . S/P CABG x 4 2006    Past Surgical History:  Procedure Laterality Date  . CARDIAC CATHETERIZATION  01/17/2008   GRAFTS PATENT / EF 40%,  Russell SANTOS ([facility])  . CORONARY ARTERY BYPASS GRAFT  04/2005   LIMA-LAD, L radial-OM-D1 and SVG-PDA  . CYSTO / RIGHT URETERAL STENT PLACEMENT  10-08-1999  . CYSTO/  REMOVAL BLADDER STONES  01-14-2010  . CYSTOSCOPY  10/16/2012   Procedure: CYSTOSCOPY;  Surgeon: Russell So, MD;  Location: [facility];  Service: Urology;  Laterality: N/A;  Cystoscopy with Clot Evacuation and fulguration bladder neck.  [place] Kitchen  EXTRACORPOREAL SHOCK WAVE LITHOTRIPSY  10-15-2009   RIGHT  . KNEE ARTHROSCOPY  1978   RIGHT  . LEFT SHOULDER SURGERY   2002  . LUMBAR FUSION  09-10-2008   L4 - L5  . PERCUTANEOUS NEPHROSTOLITHOTOMY  1979  . PROSTATE BIOPSY  10/16/2012   Procedure: PROSTATE BIOPSY;  Surgeon: Russell So, MD;  Location: [facility];  Service: Urology;  Laterality: N/A;  Through ultrasound.  [place] Kitchen SHOULDER ARTHROSCOPY W/ SUBACROMIAL DECOMPRESSION AND DISTAL CLAVICLE EXCISION  10-11-2004   AND PARTIAL ROTATOR CUFF REPAIR  . TRANSTHORACIC ECHOCARDIOGRAM  84/66/5993   LV SYSTOLIC FUNCTION MILDLY REDUCED/ EF 48%/ LV FILLING PATTERN  IS IMPAIRED/ HYPOKINESIS IN THE MID AND BASALAR SEPTUM & ANTEROSEPTUM  .  TURP VAPORIZATION  01-19-2010   BPH W/ BOO  . URETEROLITHOTOMY  1976     Prior to Admission medications   Medication Sig Start Date End Date Taking? Authorizing Provider  amLODipine (NORVASC) 5 MG tablet Take 1 tablet (5 mg total) by mouth daily. Patient taking differently: Take 5 mg by mouth daily at 12 noon.  09/19/18  Yes Trevino, Russell Lick, MD  aspirin 81 MG tablet Take 81 mg by mouth daily at 12 noon.    Yes [provider]  atorvastatin (LIPITOR) 80 MG tablet Take 1 tablet (80 mg total) by mouth daily. Patient taking differently: Take 80 mg by mouth daily at 12 noon.  09/19/18  Yes Trevino, Russell Lick, MD  BRILINTA 90 MG TABS tablet Take 90 mg by mouth daily at 12 noon.  05/28/18  Yes [provider]  carvedilol (COREG) 6.25 MG tablet Take 6.25 mg by mouth daily at 12 noon.    Yes [provider]  dexlansoprazole (DEXILANT) 60 MG capsule Take 1 capsule (60 mg total) by mouth daily before breakfast. Patient taking differently: Take 60 mg by mouth daily at 12 noon. 12 noon 10/24/18  Yes Trevino, Russell S, PA-C  finasteride (PROSCAR) 5 MG tablet Take 1 tablet (5 mg total) by mouth daily. Patient taking differently: Take 5 mg by mouth daily at 12 noon. 12 noon 09/19/18  Yes Trevino, Russell Lick, MD  furosemide (LASIX) 20 MG tablet Take 1 tablet (20 mg total) by mouth daily. Patient taking differently: Take 20 mg by mouth daily at 12 noon. 12 noon 09/19/18  Yes Trevino, Russell Lick, MD  gabapentin (NEURONTIN) 100 MG capsule Take 2 capsules (200 mg total) by mouth at bedtime. Patient taking differently: Take 100 mg by mouth daily at 12 noon. 12 noon 09/19/18  Yes Trevino, Russell Lick, MD  insulin NPH-regular Human (HUMULIN 70/30) (70-30) 100 UNIT/ML injection 90 units with breakfast, and 55 units with supper Patient taking differently: Inject 40-70 Units into the skin See admin instructions. 70 mg in the morning and 40 mg in the evening 11/03/17  Yes Russell Shin, MD  lisinopril (PRINIVIL,ZESTRIL) 40 MG  tablet Take 1 tablet (40 mg total) by mouth daily. Patient taking differently: Take 40 mg by mouth daily at 12 noon. 12 noon 09/19/18  Yes Trevino, Russell Lick, MD  metFORMIN (GLUCOPHAGE) 1000 MG tablet Take 1 tablet (1,000 mg total) by mouth 2 (two) times daily with a meal. Patient taking differently: Take 1,000 mg by mouth daily at 12 noon. 12 noon 07/14/17  Yes Trevino, Russell Lick, MD  metoprolol succinate (TOPROL-XL) 50 MG 24 hr tablet Take 1 tablet (50 mg total) by mouth daily. Take with or immediately following a meal. Patient taking differently: Take 50  mg by mouth daily at 12 noon. Take with or immediately following a meal. 12 noon 09/19/18  Yes Trevino, Russell Lick, MD  nitroGLYCERIN (NITROSTAT) 0.4 MG SL tablet Place 0.4 mg under the tongue as needed. 01/06/18  Yes [provider]  sildenafil (REVATIO) 20 MG tablet TAKE 1 TABLET BY MOUTH THREE TIMES DAILY Patient taking differently: Take 20 mg by mouth as needed.  07/10/18  Yes Trevino, Russell Lick, MD  sucralfate (CARAFATE) 1 g tablet TAKE 1 TABLET(1 GRAM) BY MOUTH FOUR TIMES DAILY AT BEDTIME WITH MEALS Patient taking differently: Take 1 g by mouth 3 (three) times daily. Last dose at bedtime 10/22/18  Yes Trevino, Russell Lick, MD  BD VEO INSULIN SYR ULTRAFINE 31G X 15/64" 1 ML MISC use as directed TO INJECT INSULIN  twice a day 06/06/17   Russell Rail, MD    Inpatient Medications: Scheduled Meds: . amLODipine  5 mg Oral Daily  . atorvastatin  80 mg Oral q1800  . finasteride  5 mg Oral Daily  . furosemide  20 mg Oral Daily  . gabapentin  200 mg Oral QHS  . heparin  4,000 Units Intravenous Once  . insulin aspart  0-9 Units Subcutaneous Q4H  . insulin glargine  20 Units Subcutaneous QHS  . lisinopril  40 mg Oral Daily  . metoprolol succinate  50 mg Oral Daily  . sildenafil  20 mg Oral TID  . sodium chloride flush  3 mL Intravenous Once  . ticagrelor  90 mg Oral BID   Continuous Infusions: . heparin 1,200 Units/hr (10/25/18 1009)  . nitroGLYCERIN 10  mcg/min (10/25/18 1247)   PRN Meds: acetaminophen **OR** acetaminophen, ondansetron **OR** ondansetron (ZOFRAN) IV  Allergies:   No Known Allergies  Social History:   Social History   Socioeconomic History  . Marital status: Married    Spouse name: Not on file  . Number of children: 4  . Years of education: Not on file  . Highest education level: Not on file  Occupational History  . Not on file  Social Needs  . Financial resource strain: Not on file  . Food insecurity:    Worry: Not on file    Inability: Not on file  . Transportation needs:    Medical: Not on file    Non-medical: Not on file  Tobacco Use  . Smoking status: Never Smoker  . Smokeless tobacco: Never Used  Substance and Sexual Activity  . Alcohol use: No  . Drug use: No  . Sexual activity: Not on file  Lifestyle  . Physical activity:    Days per week: Not on file    Minutes per session: Not on file  . Stress: Not on file  Relationships  . Social connections:    Talks on phone: Not on file    Gets together: Not on file    Attends religious service: Not on file    Active member of club or organization: Not on file    Attends meetings of clubs or organizations: Not on file    Relationship status: Not on file  . Intimate partner violence:    Fear of current or ex partner: Not on file    Emotionally abused: Not on file    Physically abused: Not on file    Forced sexual activity: Not on file  Other Topics Concern  . Not on file  Social History Narrative   Lives in [place], [place], has family and friends in [place].  Family History:   Family History  Problem Relation Age of Onset  . Diabetes Father    Family Status:  Family Status  Relation Name Status  . Mother  Deceased  . Father  Deceased    ROS:  Please see the history of present illness.  All other ROS reviewed and negative.     Physical Exam/Data:   Vitals:   10/25/18 1315 10/25/18 1330 10/25/18 1345 10/25/18 1433  BP: 133/60 (!)  137/57 123/68 128/63  Pulse: 68 66 65 67  Resp: 20 19 15    Temp:    97.6 F (36.4 C)  TempSrc:    Oral  SpO2: 98% 98% 98% 97%  Weight:    115.3 kg  Height:    6' (1.829 m)    Intake/Output Summary (Last 24 hours) at 10/25/2018 1730 Last data filed at 10/25/2018 1500 Gross per 24 hour  Intake 64.87 ml  Output -  Net 64.87 ml   Filed Weights   10/25/18 0921 10/25/18 1433  Weight: 113.4 kg 115.3 kg   Body mass index is 34.46 kg/m.  General:  Well nourished, well developed, male in no acute distress HEENT: normal Lymph: no adenopathy Neck: no JVD seen, difficult to assess 2nd body habitus Endocrine:  No thryomegaly Vascular: No carotid bruits; 4/4 extremity pulses 2+, without bruits  Cardiac:  normal S1, S2; RRR; no murmur  Lungs:  Decreased BS bases auscultation bilaterally, no wheezing, rhonchi or rales  Abd: soft, nontender, no hepatomegaly  Ext: R>L trace-1+ LE edema Musculoskeletal:  No deformities, BUE and BLE strength normal and equal Skin: warm and dry  Neuro:  CNs 2-12 intact, no focal abnormalities noted Psych:  Normal affect   EKG:  The EKG was personally reviewed and demonstrates:  02/13, SR, HR 78, NS IVCD w/ QRS duration 125 ms, QRS duration was 106 ms 12/2017 and was 112 ms on 10/22/2018 Telemetry:  Telemetry was personally reviewed and demonstrates:  SR, occ multifocal PVCs and pairs  Relevant CV Studies:  ECHO: 01/05/2018 SUMMARY  The left ventricle is moderately dilated.  There is normal left ventricular wall thickness.    Left ventricular systolic function is borderline reduced.  LV ejection fraction = 45-50%.  There are regional wall motion abnormalities as specified below.  The right ventricle is normal in size and function.  There is no significant valvular stenosis or regurgitation  There is no pericardial effusion.  IVC size was mildly dilated.  Borderline dilated ascending aorta.  Probably no significant change in comparison with  the prior study of   01/09/2012.  -  FINDINGS:    LEFT VENTRICLE  The left ventricle is moderately dilated. There is normal left ventricular   wall thickness. Left ventricular systolic function is borderline reduced. LV   ejection fraction = 45-50%. Left ventricular filling pattern is prolonged   relaxation. There are regional wall motion abnormalities as specified below.   There is mid LV septal wall mild hypokinesis. There is mid LV anterior septal   hypokinesis. There is basal LV anterior wall mild hypokinesis.  -    RIGHT VENTRICLE  The right ventricle is normal in size and function.    LEFT ATRIUM  The left atrial size is normal.    RIGHT ATRIUM    Right atrial size is normal.  -  AORTIC VALVE  There is aortic valve sclerosis. The aortic valve is trileaflet. The aortic   valve opens well. There is no aortic stenosis. There is no  aortic   regurgitation.  -  MITRAL VALVE  The mitral valve is normal in structure and function. There is trace mitral   regurgitation.  -  TRICUSPID VALVE  The tricuspid valve is normal in structure and function. There is trace   tricuspid regurgitation.  -  PULMONIC VALVE  The pulmonic valve is not well visualized. Trace pulmonic valvular   regurgitation.  -  ARTERIES  The aortic root is normal size. Borderline dilated ascending aorta.  -  VENOUS  Pulmonary venous flow pattern is normal. IVC size was mildly dilated.  -  EFFUSION  There is no pericardial effusion.  -  -  MMode/2D Measurements & Calculations  IVSd: 0.62 cm  LVIDd: 6.5 cm  LVPWd: 0.70 cm  LVIDs: 5.6 cm  LA dim: 4.2 cm  Ao root: 2.9 cm  EDV(MOD-sp4): 89.3 ml  ESV(MOD-sp4): 23.2 ml  EDV(MOD-sp2): 145.3 ml  ESV(MOD-sp2): 73.1 ml  asc Aorta Diam: 3.6 cm  LVOT diam: 1.9 cm  SV(MOD-sp4): 66.1 ml  SI(MOD-sp4): 27.9 ml/m2  IVC 1: 1.9 cm  LA area A2: 17.5 cm2  LA area A4: 19.2 cm2  LA vol: 47.4  ml  LA vol index: 20.0 ml/m2  RA area A4: 11.8 cm2  Doppler Measurements & Calculations  MV E max vel: 107.4 cm/sec  MV A max vel: 54.8 cm/sec  MV E/A: 2.0  Med Peak E' Vel: 2.6 cm/sec  Lat Peak E' Vel: 8.0 cm/sec  E/Lat E`: 13.4  E/Med E`: 40.6  MV Trevino time: 0.22 sec  SV(LVOT): 67.7 ml  Ao V2 max: 144.1 cm/sec  Ao max PG: 8.3 mmHg  Ao V2 mean: 98.6 cm/sec  Ao mean PG: 4.5 mmHg  Ao V2 VTI: 27.7 cm  AVA (VTI): 2.4 cm2  LV V1 VTI: 24.1 cm  AS Dimensionless Index (VTI): 0.87  AVAi(VTI) cm^2/m^2: 1.0 cm2  SV index(LVOT): 28.6 ml/m2   CATH: 01/05/2018 In view of history of left radial harvest for CABG - access was via right  femoral artery. Ultrasound guided micro puncture technique used to insert 48F sheath via  right femoral artery. Patient has a lot of pannus and sheath is just above the bifurcation.  Native coronaries: 100% prox LAD 100% mid CX 100% prox RCA Grafts: Patent LIMA-LAD Patent left radial from coming off mid LIMA to OM and diagonal  100% proximal SVG-PDA - does not appear acute. Distal RCA/PDA fills via L-R collaterals. Did not see any region that would benefit from PCI DES Stopped at diagnostic.    Laboratory Data:  Chemistry Recent Labs  Lab 10/22/18 1149 10/25/18 0925  NA 137 140  K 3.9 3.9  CL 102 104  CO2 28 22  GLUCOSE 246* 97  BUN 22 13  CREATININE 1.26 1.21  CALCIUM 9.0 8.9  GFRNONAA  --  >60  GFRAA  --  >60  ANIONGAP  --  14    Lab Results  Component Value Date   ALT 13 10/22/2018   AST 23 10/22/2018   ALKPHOS 81 10/22/2018   BILITOT 1.8 (H) 10/22/2018   Hematology Recent Labs  Lab 10/22/18 1149 10/25/18 0925  WBC 10.0 9.3  RBC 4.46 4.74  HGB 13.3 13.8  HCT 39.0 42.0  MCV 87.4 88.6  MCH  --  29.1  MCHC 34.2 32.9  RDW 14.5 14.1  PLT 183.0 197   Cardiac Enzymes Recent Labs  Lab 10/25/18 1442  TROPONINI 1.33*    Recent Labs  Lab 10/25/18 0931  TROPIPOC 1.30*  BNP Recent Labs  Lab  10/25/18 0925  BNP 598.0*    TSH:  Lab Results  Component Value Date   TSH 1.86 07/13/2017   Lipids: Lab Results  Component Value Date   CHOL 227 (H) 10/22/2018   HDL 38.20 (L) 10/22/2018   LDLCALC 150 (H) 10/22/2018   LDLDIRECT 155.0 07/13/2017   TRIG 192.0 (H) 10/22/2018   CHOLHDL 6 10/22/2018   HgbA1c: Lab Results  Component Value Date   HGBA1C 9.9 (H) 10/22/2018   Magnesium: No results found for: MG   Radiology/Studies:  Dg Chest 2 View  Result Date: 10/25/2018 CLINICAL DATA:  Dry cough for 1 week. EXAM: CHEST - 2 VIEW COMPARISON:  10/22/2018 FINDINGS: Stable postsurgical changes from CABG. Cardiomediastinal silhouette is normal. Mediastinal contours appear intact. There is no evidence of focal airspace consolidation, pleural effusion or pneumothorax. Osseous structures are without acute abnormality. Soft tissues are grossly normal. IMPRESSION: No active cardiopulmonary disease. Electronically Signed   By: Fidela Salisbury M.D.   On: 10/25/2018 10:08   Dg Chest 2 View  Result Date: 10/22/2018 CLINICAL DATA:  Shortness of breath on exertion for 4 days. EXAM: CHEST - 2 VIEW COMPARISON:  January 04, 2018 and August 17, 2016 FINDINGS: No pneumothorax. Stable cardiomegaly. The hila and mediastinum are unchanged. Chronic scar atelectasis at the left base. No new infiltrate, nodule, or mass. IMPRESSION: No acute interval change. Electronically Signed   By: Dorise Bullion III M.D   On: 10/22/2018 15:12    Assessment and Plan:   1. NSTEMI:  -He received 81 mg x 4 aspirin in the emergency room plus IV beta-blocker and nitrates. -He is currently resting comfortably. -We will order echocardiogram - The risks and benefits of a cardiac catheterization including, but not limited to, death, stroke, MI, kidney damage and bleeding were discussed with the patient who indicates understanding and agrees to proceed.  -he is on the board for tomorrow, with Russell. [place], orders written -At  his last cath, they went from the right groin, left radial was not used because his left radial was used for his bypass surgery  2.  Volume overload, likely acute on chronic combined systolic and diastolic CHF: -His BNP is elevated, he has lower extremity edema and dyspnea on exertion. -At the time of his non-STEMI 12/2017, he also had some problems with volume overload and required IV Lasix.  His EF at that time was 45-50%.  He was discharged on Lasix 40 mg twice daily -When he followed up with Russell. Quay Burow 06/2018, he was not on any Lasix, she started him on Lasix 20 mg daily for lower extremity edema - He has not been monitoring the sodium in his diet.  The amount of liquids that he drinks varies greatly depending on whether he is working outside in the heat or not. -He has not been doing daily weights but can start -Will give 1 dose of Lasix 40 mg IV with 40 mEq of potassium -Check echo -Check BMET in a.m.  3.  Hyperlipidemia:  -He has been on high-dose statin and states he has not been missing many doses - His cholesterol is still extremely high, he will need to be followed closely for this and may need PCSK9 inhibitor  Otherwise, per IM Principal Problem:   NSTEMI (non-ST elevated myocardial infarction) ([place]) Active Problems:   Hx of CABG   Chronic coronary artery disease   Essential hypertension, benign   Hyperlipidemia   Diabetes ([place])   For  questions or updates, please contact [place] Please consult [email or web address] for contact info under Cardiology/STEMI.   Signed, Rosaria Ferries, PA-C  10/25/2018 5:30 PM  Patient seen and examined with the above-signed Advanced Practice Provider and/or Housestaff. I personally reviewed laboratory data, imaging studies and relevant notes. I independently examined the patient and formulated the important aspects of the plan. I have edited the note to reflect any of my changes or salient points. I have personally discussed the plan with the  patient and/or family.  68 y/o obese male with h/o CAD s/p CABG x 4 in 2006. Underwent cath in 4/19 at [facility] in 4/19 after PCP checked a troponin at routine office visit and found it to be elevated. No symptoms at that time. Cath as above with severe native CAD and all grafts patent except chronic occlusion of SVG-PDA. EF 45-50%.   Did well since that time until about two weeks ago when he developed progressive heartburn, chest tightness, DOE, orthopnea and PND. (previous angina was chest and arm pain). Not having arm pain recently. Saw GI and now came to ER. BNP 598. Trop 1.3. ECG NSR with incomplete LBBB. CXR ok   On exam comfortable. Thick neck with jvp to jaw Cor RRR Lungs clear Ab obese NT ND EXT 1-2+ ankle edema  Given recent cath with stable CAD and symptom complex, I suspect the main issue here is HF and not ACS but accelerating heartburn with elevated troponin certainly a concern though very different from previous angina. Will start IV lasix. Check echo proceed with R/L cath tomorrow. Will need outpatient sleep study.   Glori Bickers, MD  7:38 PM

## 2018-10-25 NOTE — ED Notes (Signed)
Pt to x-ray with transporter .

## 2018-10-25 NOTE — H&P (Signed)
History and Physical    Russell Trevino FKC:[id number] DOB: [birth year] DOA: 10/25/2018  PCP: Binnie Rail, MD  Patient coming from: Home  Chief Complaint: Chest pain   HPI: Russell Trevino is a 68 y.o. male with medical history significant of CAD s/p CABG, DM, HLD who presents with 3-week history of heartburn.  He was evaluated by PCP, was referred to GI.  He states that his heartburn was constant in nature, associated with gas.  His over-the-counter medications did not help.  He then started having shortness of breath yesterday, worsening with exertion, barely able to walk up 3-4 steps which is not his usual baseline functional status.  He was recommended to be evaluated in the emergency department.  He has a cardiologist at [facility], who is no longer with [facility], and so patient needs a new cardiologist.  Currently in the emergency department, he is chest pain-free, shortness of breath has improved with Nitropaste.  Denies any nausea or vomiting.  Some abdominal soreness, no diarrhea.  He does have chronic left greater than right peripheral edema.  ED Course: Labs obtained which revealed BNP 598, troponin 1.3.  Cardiology was consulted.  Patient started on IV heparin.  Review of Systems: As per HPI otherwise 10 point review of systems negative.   Past Medical History:  Diagnosis Date  . BPH (benign prostatic hypertrophy)   . Cardiomyopathy, ischemic   . Coronary artery disease CARDIOLOGIST- DR Frederico Hamman  ([facility])--  LAST VISIT APRIL 2013--  REQUEST FOR RECORD FAXED   LAST CARDIAC CATH 2009 (GRAFTS PATENT)/ LAST STRESS TEST 2010-  PER PT OK  . Diabetes mellitus, type 2 ([place])   . Frequency of urination   . Gross hematuria   . History of CHF (congestive heart failure) 2007  . History of kidney stones   . History of myocardial infarction 2006 --  NON ST ELEVATED  MI  S/P CABG  , [facility]  . History of urinary retention   . Hyperlipidemia   . Nocturia   . Renal calculi BILATERAL  PER CT SCAN--  NON-OBSTRUCTIVE  . S/P CABG x 4 2006  . Urgency of urination     Past Surgical History:  Procedure Laterality Date  . CARDIAC CATHETERIZATION  01-17-2008  DR SANTOS ([facility])   GRAFTS PATENT / EF 40%  . CORONARY ARTERY BYPASS GRAFT  AUG 2006   X4  VESSEL  . CYSTO / RIGHT URETERAL STENT PLACEMENT  10-08-1999  . CYSTO/  REMOVAL BLADDER STONES  01-14-2010  . CYSTOSCOPY  10/16/2012   Procedure: CYSTOSCOPY;  Surgeon: Malka So, MD;  [facility]  Service: Urology;  Laterality: N/A;  Cystoscopy with Clot Evacuation and fulguration bladder neck.  Marland Kitchen EXTRACORPOREAL SHOCK WAVE LITHOTRIPSY  10-15-2009   RIGHT  . KNEE ARTHROSCOPY  1978   RIGHT  . LEFT SHOULDER SURGERY   2002  . LUMBAR FUSION  09-10-2008   L4 - L5  . PERCUTANEOUS NEPHROSTOLITHOTOMY  1979  . PROSTATE BIOPSY  10/16/2012   Procedure: PROSTATE BIOPSY;  Surgeon: Malka So, MD;  Location: [facility];  Service: Urology;  Laterality: N/A;  Through ultrasound.  Marland Kitchen SHOULDER ARTHROSCOPY W/ SUBACROMIAL DECOMPRESSION AND DISTAL CLAVICLE EXCISION  10-11-2004   AND PARTIAL ROTATOR CUFF REPAIR  . TRANSTHORACIC ECHOCARDIOGRAM  01-09-2012  [facility]   LV SYSTOLIC FUNCTION MILDLY REDUCED/ EF 48%/ LV FILLING PATTERN  IS IMPAIRED/ HYPOKINESIS IN THE MID AND BASALAR SEPTUM & ANTEROSEPTUM  . TURP  VAPORIZATION  01-19-2010   BPH W/ BOO  . URETEROLITHOTOMY  1976     reports that he has never smoked. He has never used smokeless tobacco. He reports that he does not drink alcohol or use drugs.  No Known Allergies  Family History  Problem Relation Age of Onset  . Diabetes Father     Prior to Admission medications   Medication Sig Start Date End Date Taking? Authorizing Provider  amLODipine (NORVASC) 5 MG tablet Take 1 tablet (5 mg total) by mouth daily. 09/19/18   Binnie Rail, MD  aspirin 81 MG tablet Take 81 mg by mouth daily.    [provider]  atorvastatin (LIPITOR) 80 MG  tablet Take 1 tablet (80 mg total) by mouth daily. 09/19/18   Binnie Rail, MD  BD VEO INSULIN SYR ULTRAFINE 31G X 15/64" 1 ML MISC use as directed TO INJECT INSULIN  twice a day 06/06/17   Binnie Rail, MD  BRILINTA 90 MG TABS tablet  05/28/18   [provider]  dexlansoprazole (DEXILANT) 60 MG capsule Take 1 capsule (60 mg total) by mouth daily before breakfast. 10/24/18   Esterwood, Amy S, PA-C  finasteride (PROSCAR) 5 MG tablet Take 1 tablet (5 mg total) by mouth daily. 09/19/18   Binnie Rail, MD  furosemide (LASIX) 20 MG tablet Take 1 tablet (20 mg total) by mouth daily. 09/19/18   Binnie Rail, MD  gabapentin (NEURONTIN) 100 MG capsule Take 2 capsules (200 mg total) by mouth at bedtime. 09/19/18   Binnie Rail, MD  insulin NPH-regular Human (HUMULIN 70/30) (70-30) 100 UNIT/ML injection 90 units with breakfast, and 55 units with supper 11/03/17   Renato Shin, MD  lisinopril (PRINIVIL,ZESTRIL) 40 MG tablet Take 1 tablet (40 mg total) by mouth daily. 09/19/18   Binnie Rail, MD  metFORMIN (GLUCOPHAGE) 1000 MG tablet Take 1 tablet (1,000 mg total) by mouth 2 (two) times daily with a meal. 07/14/17   Burns, Claudina Lick, MD  metoprolol succinate (TOPROL-XL) 50 MG 24 hr tablet Take 1 tablet (50 mg total) by mouth daily. Take with or immediately following a meal. 09/19/18   Burns, Claudina Lick, MD  sildenafil (REVATIO) 20 MG tablet TAKE 1 TABLET BY MOUTH THREE TIMES DAILY 07/10/18   Binnie Rail, MD  sucralfate (CARAFATE) 1 g tablet TAKE 1 TABLET(1 GRAM) BY MOUTH FOUR TIMES DAILY AT BEDTIME WITH MEALS 10/22/18   Binnie Rail, MD    Physical Exam: Vitals:   10/25/18 0920 10/25/18 0921 10/25/18 0930 10/25/18 1015  BP:   (!) 181/91 (!) 158/85  Pulse: 79   66  Resp:   (!) 35 20  SpO2: 98%   95%  Weight:  113.4 kg    Height:  6' (1.829 m)       Constitutional: NAD, calm, comfortable Eyes: PERRL, lids and conjunctivae normal ENMT: Mucous membranes are moist. Posterior pharynx clear of any  exudate or lesions.Normal dentition.  Neck: normal, supple, no masses, no thyromegaly Respiratory: clear to auscultation bilaterally, no wheezing, no crackles. Normal respiratory effort. No accessory muscle use.  Cardiovascular: Regular rate and rhythm, no murmurs / rubs / gallops. + Pitting edema left greater than right lower extremity Abdomen: no tenderness, no masses palpated. No hepatosplenomegaly. Bowel sounds positive.  Musculoskeletal: no clubbing / cyanosis. No joint deformity upper and lower extremities. Good ROM, no contractures. Normal muscle tone.  Skin: no rashes, lesions, ulcers. No induration Neurologic: CN 2-12 grossly  intact. Strength 5/5 in all 4.  Psychiatric: Normal judgment and insight. Alert and oriented x 3. Normal mood.   Labs on Admission: I have personally reviewed following labs and imaging studies  CBC: Recent Labs  Lab 10/22/18 1149 10/25/18 0925  WBC 10.0 9.3  NEUTROABS 7.6  --   HGB 13.3 13.8  HCT 39.0 42.0  MCV 87.4 88.6  PLT 183.0 628   Basic Metabolic Panel: Recent Labs  Lab 10/22/18 1149 10/25/18 0925  NA 137 140  K 3.9 3.9  CL 102 104  CO2 28 22  GLUCOSE 246* 97  BUN 22 13  CREATININE 1.26 1.21  CALCIUM 9.0 8.9   GFR: Estimated Creatinine Clearance: 76 mL/min (by C-G formula based on SCr of 1.21 mg/dL). Liver Function Tests: Recent Labs  Lab 10/22/18 1149  AST 23  ALT 13  ALKPHOS 81  BILITOT 1.8*  PROT 6.9  ALBUMIN 3.7   No results for input(s): LIPASE, AMYLASE in the last 168 hours. No results for input(s): AMMONIA in the last 168 hours. Coagulation Profile: No results for input(s): INR, PROTIME in the last 168 hours. Cardiac Enzymes: No results for input(s): CKTOTAL, CKMB, CKMBINDEX, TROPONINI in the last 168 hours. BNP (last 3 results) Recent Labs    01/04/18 0907  PROBNP 398.0*   HbA1C: No results for input(s): HGBA1C in the last 72 hours. CBG: Recent Labs  Lab 10/25/18 0957  GLUCAP 83   Lipid Profile: No  results for input(s): CHOL, HDL, LDLCALC, TRIG, CHOLHDL, LDLDIRECT in the last 72 hours. Thyroid Function Tests: No results for input(s): TSH, T4TOTAL, FREET4, T3FREE, THYROIDAB in the last 72 hours. Anemia Panel: No results for input(s): VITAMINB12, FOLATE, FERRITIN, TIBC, IRON, RETICCTPCT in the last 72 hours. Urine analysis:    Component Value Date/Time   COLORURINE RED BIOCHEMICALS MAY BE AFFECTED BY COLOR (A) 01/14/2010 2144   APPEARANCEUR CLOUDY (A) 01/14/2010 2144   LABSPEC 1.016 01/14/2010 2144   PHURINE 6.0 01/14/2010 2144   GLUCOSEU NEGATIVE 01/14/2010 2144   HGBUR LARGE (A) 01/14/2010 2144   BILIRUBINUR MODERATE (A) 01/14/2010 2144   KETONESUR TRACE (A) 01/14/2010 2144   PROTEINUR >300 (A) 01/14/2010 2144   UROBILINOGEN 1.0 01/14/2010 2144   NITRITE NEGATIVE 01/14/2010 2144   LEUKOCYTESUR SMALL (A) 01/14/2010 2144   Sepsis Labs: !!!!!!!!!!!!!!!!!!!!!!!!!!!!!!!!!!!!!!!!!!!! @LABRCNTIP (procalcitonin:4,lacticidven:4) )No results found for this or any previous visit (from the past 240 hour(s)).   Radiological Exams on Admission: Dg Chest 2 View  Result Date: 10/25/2018 CLINICAL DATA:  Dry cough for 1 week. EXAM: CHEST - 2 VIEW COMPARISON:  10/22/2018 FINDINGS: Stable postsurgical changes from CABG. Cardiomediastinal silhouette is normal. Mediastinal contours appear intact. There is no evidence of focal airspace consolidation, pleural effusion or pneumothorax. Osseous structures are without acute abnormality. Soft tissues are grossly normal. IMPRESSION: No active cardiopulmonary disease. Electronically Signed   By: Fidela Salisbury M.D.   On: 10/25/2018 10:08   Chest x-ray reviewed independently, no focal consolidation, pleural effusion.  Patient with cardiomegaly and minimal pulmonary vascular congestion.   EKG: Independently reviewed.  Normal sinus rhythm with right axis deviation, T wave inversion in inferior leads  Assessment/Plan Principal Problem:   NSTEMI (non-ST  elevated myocardial infarction) ([place]) Active Problems:   Hx of CABG   Chronic coronary artery disease   Essential hypertension, benign   Hyperlipidemia   Diabetes ([place])  NSTEMI -Cardiology consulted -Nitro paste, aspirin given in ED  -Heparin gtt -Continue Brilinta  Hyperlipidemia -LDL 150 -Continue Lipitor  Essential hypertension -  Continue Norvasc, lisinopril, Toprol  Chronic systolic heart failure -EF 45 to 50% in April 2019 -Continue Lasix  Diabetes mellitus type 2, uncontrolled with hyperglycemia -Hold metformin -Continue Lantus, sliding scale insulin NovoLog    DVT prophylaxis: Heparin gtt Code Status: Full  Family Communication: At bedside Disposition Plan: Pending cardiology evaluation  Consults called: Cardiology   Admission status: Inpatient   Severity of Illness: The appropriate patient status for this patient is INPATIENT. Inpatient status is judged to be reasonable and necessary in order to provide the required intensity of service to ensure the patient's safety. The patient's presenting symptoms, physical exam findings, and initial radiographic and laboratory data in the context of their chronic comorbidities is felt to place them at high risk for further clinical deterioration. Furthermore, it is not anticipated that the patient will be medically stable for discharge from the hospital within 2 midnights of admission. The following factors support the patient status of inpatient.   " The patient's presenting symptoms include heartburn and shortness of breath with exertion. " The worrisome physical exam findings include L>R peripheral edema, dyspnea. " The initial radiographic and laboratory data are worrisome because of troponin 1.30. " The chronic co-morbidities include CAD, DM, HTN, HLD    * I certify that at the point of admission it is my clinical judgment that the patient will require inpatient hospital care spanning beyond 2 midnights from the point  of admission due to high intensity of service, high risk for further deterioration and high frequency of surveillance required.Dessa Phi, DO [facility] 10/25/2018, 11:59 AM

## 2018-10-25 NOTE — ED Triage Notes (Signed)
Pt reports heartburn, gas pains for the last 3 weeks. Saw GI specialist yesterday. Began having SOB, 3 days ago, swelling to lower extremities recently, wheezing. HX CABG. Pt with substernal CP. Pt reports pain is worse with exertion.

## 2018-10-25 NOTE — ED Provider Notes (Signed)
Medical screening examination/treatment/procedure(s) were conducted as a shared visit with non-physician practitioner(s) and myself.  I personally evaluated the patient during the encounter.  EKG Interpretation  Date/Time:  Thursday October 25 2018 09:23:26 EST Ventricular Rate:  78 PR Interval:    QRS Duration: 125 QT Interval:  391 QTC Calculation: 446 R Axis:   103 Text Interpretation:  Sinus rhythm Consider left ventricular hypertrophy Non-specific ST-t changes Confirmed by Virgel Manifold [phone]) on 10/25/2018 9:30:40 AM   67yM with exertional CP and dyspnea. Worse with exertion. Orthopnea. Hx of CAD s/ CABG. Has had previous evaluation through [facility] but actually resides in [facility]. Daughter lives in [place] and he is currently staying with a friend to assist her with her health issues.   His symptoms are concerning. His troponin is >1. He is having some mild discomfort currently. ASA, heparin, nitro. Cardiology consultation. Admission.   CRITICAL CARE Performed by: Virgel Manifold Total critical care time: 35 minutes Critical care time was exclusive of separately billable procedures and treating other patients. Critical care was necessary to treat or prevent imminent or life-threatening deterioration. Critical care was time spent personally by me on the following activities: development of treatment plan with patient and/or surrogate as well as nursing, discussions with consultants, evaluation of patient's response to treatment, examination of patient, obtaining history from patient or surrogate, ordering and performing treatments and interventions, ordering and review of laboratory studies, ordering and review of radiographic studies, pulse oximetry and re-evaluation of patient's condition.    Virgel Manifold, MD 10/26/18 1357

## 2018-10-25 NOTE — ED Provider Notes (Signed)
[facility] EMERGENCY DEPARTMENT Provider Note   CSN: [id number] Arrival date & time: 10/25/18  0092     History   Chief Complaint No chief complaint on file.   HPI Russell Trevino is a 68 y.o. male with h/o CAD s/p CABG x 4 2006, recent NSTEMI, ischemic CM EF 45-50%, DM on insulin, HLD, GERD, chronic peripheral edema, HTN, obesity is here for evaluation of chest pain. Onset 2-3 weeks ago, worsening. It is exertional, substernal described as tightness.  Cannot go up 6 steps without chest pain.  It is associated with shortness of breath, minimally productive cough, orthopnea and PND.  Has to sit up in bed to sleep. Compliant with medications but missed brillinta for 1 week recently. Pt went to see his PCP for chest pain/GERD like symptoms who referred to GI.  Pt seen by GI yesterday who did not think it was GI process and documented concern for cardiac etiology recommending ER visit.  Currently having 5/10 substernal chest tightness which is non pleuritic, non positional.  His CP has not changed with food intake.  No interventions. Alleviated with rest. Worse with exertion.   Denies fevers, chills, changes to chronic peripheral edema, unilateral calf pain, recent prolonged travel, hemoptysis, h/o DVT/PE or recent surgery/immobilization, nausea, vomiting, abdominal pain.   HPI  Past Medical History:  Diagnosis Date  . BPH (benign prostatic hypertrophy)   . Cardiomyopathy, ischemic   . Coronary artery disease CARDIOLOGIST- DR Frederico Hamman  ([facility])--  LAST VISIT APRIL 2013--  REQUEST FOR RECORD FAXED   LAST CARDIAC CATH 2009 (GRAFTS PATENT)/ LAST STRESS TEST 2010-  PER PT OK  . Diabetes mellitus, type 2 ([place])   . Frequency of urination   . Gross hematuria   . History of CHF (congestive heart failure) 2007  . History of kidney stones   . History of myocardial infarction 2006 --  NON ST ELEVATED  MI  S/P CABG  , [facility]  . History of urinary retention   . Hyperlipidemia   .  Nocturia   . Renal calculi BILATERAL PER CT SCAN--  NON-OBSTRUCTIVE  . S/P CABG x 4 2006  . Urgency of urination     Patient Active Problem List   Diagnosis Date Noted  . NSTEMI (non-ST elevated myocardial infarction) ([place]) 10/25/2018  . SOB (shortness of breath) 10/22/2018  . Chest pain 10/22/2018  . Left leg cellulitis 09/19/2018  . Leg swelling 06/20/2018  . Right buttock pain 06/20/2018  . Chest pain on exertion 01/04/2018  . Diabetes ([place]) 08/26/2017  . GERD (gastroesophageal reflux disease) 07/13/2017  . Cough 08/17/2016  . Thoracic back pain 06/21/2016  . Hx of CABG 05/26/2016  . Prostate cancer ([place]) 05/26/2016  . Essential hypertension, benign 05/26/2016  . Hyperlipidemia 05/26/2016  . Cardiomyopathy ([place]) 03/25/2013  . Chronic coronary artery disease 03/25/2013  . Left ventricular systolic dysfunction 33/00/7622  . MI (myocardial infarction) ([place]) 01/09/2012    Past Surgical History:  Procedure Laterality Date  . CARDIAC CATHETERIZATION  01-17-2008  DR SANTOS ([facility])   GRAFTS PATENT / EF 40%  . CORONARY ARTERY BYPASS GRAFT  AUG 2006   X4  VESSEL  . CYSTO / RIGHT URETERAL STENT PLACEMENT  10-08-1999  . CYSTO/  REMOVAL BLADDER STONES  01-14-2010  . CYSTOSCOPY  10/16/2012   Procedure: CYSTOSCOPY;  Surgeon: Malka So, MD;  Location: [facility];  Service: Urology;  Laterality: N/A;  Cystoscopy with Clot Evacuation and fulguration bladder neck.  Marland Kitchen  EXTRACORPOREAL SHOCK WAVE LITHOTRIPSY  10-15-2009   RIGHT  . KNEE ARTHROSCOPY  1978   RIGHT  . LEFT SHOULDER SURGERY   2002  . LUMBAR FUSION  09-10-2008   L4 - L5  . PERCUTANEOUS NEPHROSTOLITHOTOMY  1979  . PROSTATE BIOPSY  10/16/2012   Procedure: PROSTATE BIOPSY;  Surgeon: Malka So, MD;  Location: [facility];  Service: Urology;  Laterality: N/A;  Through ultrasound.  Marland Kitchen SHOULDER ARTHROSCOPY W/ SUBACROMIAL DECOMPRESSION AND DISTAL CLAVICLE EXCISION  10-11-2004   AND PARTIAL ROTATOR  CUFF REPAIR  . TRANSTHORACIC ECHOCARDIOGRAM  01-09-2012  [facility]   LV SYSTOLIC FUNCTION MILDLY REDUCED/ EF 48%/ LV FILLING PATTERN  IS IMPAIRED/ HYPOKINESIS IN THE MID AND BASALAR SEPTUM & ANTEROSEPTUM  . TURP VAPORIZATION  01-19-2010   BPH W/ BOO  . URETEROLITHOTOMY  1976        Home Medications    Prior to Admission medications   Medication Sig Start Date End Date Taking? Authorizing Provider  amLODipine (NORVASC) 5 MG tablet Take 1 tablet (5 mg total) by mouth daily. 09/19/18   Binnie Rail, MD  aspirin 81 MG tablet Take 81 mg by mouth daily.    [provider]  atorvastatin (LIPITOR) 80 MG tablet Take 1 tablet (80 mg total) by mouth daily. 09/19/18   Binnie Rail, MD  BD VEO INSULIN SYR ULTRAFINE 31G X 15/64" 1 ML MISC use as directed TO INJECT INSULIN  twice a day 06/06/17   Binnie Rail, MD  BRILINTA 90 MG TABS tablet  05/28/18   [provider]  dexlansoprazole (DEXILANT) 60 MG capsule Take 1 capsule (60 mg total) by mouth daily before breakfast. 10/24/18   Esterwood, Amy S, PA-C  finasteride (PROSCAR) 5 MG tablet Take 1 tablet (5 mg total) by mouth daily. 09/19/18   Binnie Rail, MD  furosemide (LASIX) 20 MG tablet Take 1 tablet (20 mg total) by mouth daily. 09/19/18   Binnie Rail, MD  gabapentin (NEURONTIN) 100 MG capsule Take 2 capsules (200 mg total) by mouth at bedtime. 09/19/18   Binnie Rail, MD  insulin NPH-regular Human (HUMULIN 70/30) (70-30) 100 UNIT/ML injection 90 units with breakfast, and 55 units with supper 11/03/17   Renato Shin, MD  lisinopril (PRINIVIL,ZESTRIL) 40 MG tablet Take 1 tablet (40 mg total) by mouth daily. 09/19/18   Binnie Rail, MD  metFORMIN (GLUCOPHAGE) 1000 MG tablet Take 1 tablet (1,000 mg total) by mouth 2 (two) times daily with a meal. 07/14/17   Burns, Claudina Lick, MD  metoprolol succinate (TOPROL-XL) 50 MG 24 hr tablet Take 1 tablet (50 mg total) by mouth daily. Take with or immediately following a meal. 09/19/18   Burns, Claudina Lick,  MD  sildenafil (REVATIO) 20 MG tablet TAKE 1 TABLET BY MOUTH THREE TIMES DAILY 07/10/18   Binnie Rail, MD  sucralfate (CARAFATE) 1 g tablet TAKE 1 TABLET(1 GRAM) BY MOUTH FOUR TIMES DAILY AT BEDTIME WITH MEALS 10/22/18   Binnie Rail, MD    Family History Family History  Problem Relation Age of Onset  . Diabetes Father     Social History Social History   Tobacco Use  . Smoking status: Never Smoker  . Smokeless tobacco: Never Used  Substance Use Topics  . Alcohol use: No  . Drug use: No     Allergies   Patient has no known allergies.   Review of Systems Review of Systems  Respiratory: Positive for cough,  chest tightness and shortness of breath.   Cardiovascular: Positive for chest pain and palpitations (chronic L>R).  Hematological: Bruises/bleeds easily.  All other systems reviewed and are negative.    Physical Exam Updated Vital Signs BP (!) 158/85   Pulse 66   Resp 20   Ht 6' (1.829 m)   Wt 113.4 kg   SpO2 95%   BMI 33.91 kg/m   Physical Exam Constitutional:      Appearance: He is well-developed.     Comments: NAD. Non toxic.   HENT:     Head: Normocephalic and atraumatic.     Nose: Nose normal.  Eyes:     General: Lids are normal.     Conjunctiva/sclera: Conjunctivae normal.  Neck:     Musculoskeletal: Normal range of motion.     Trachea: Trachea normal.     Comments: Trachea midline.  Cardiovascular:     Rate and Rhythm: Normal rate and regular rhythm.     Pulses:          Carotid pulses are 2+ on the right side and 2+ on the left side.      Radial pulses are 2+ on the right side and 2+ on the left side.       Dorsalis pedis pulses are 2+ on the right side and 2+ on the left side.     Heart sounds: Normal heart sounds, S1 normal and S2 normal.     Comments: 1+ pitting edema to L extremity from knee down, no calf tenderness but mild erythema diffusely.  Trace edema in RLE.  Pulmonary:     Effort: Pulmonary effort is normal.     Breath  sounds: Decreased air movement present. Decreased breath sounds present.     Comments: Normal WOB. Diminished air entry in lower lobes difficult exam due to body habitus. No wheezing, crackles.  Abdominal:     General: Bowel sounds are normal.     Palpations: Abdomen is soft.     Tenderness: There is no abdominal tenderness.     Hernia: A hernia is present.     Comments: Obese abdomen. Umbilical hernia noted non tender easily reducible. No epigastric tenderness.   Musculoskeletal:     Right lower leg: Edema present.     Left lower leg: 1+ Pitting Edema present.  Skin:    General: Skin is warm and dry.     Capillary Refill: Capillary refill takes less than 2 seconds.     Comments: No rash to chest wall  Neurological:     Mental Status: He is alert.     GCS: GCS eye subscore is 4. GCS verbal subscore is 5. GCS motor subscore is 6.  Psychiatric:        Speech: Speech normal.        Behavior: Behavior normal.        Thought Content: Thought content normal.      ED Treatments / Results  Labs (all labs ordered are listed, but only abnormal results are displayed) Labs Reviewed  BRAIN NATRIURETIC PEPTIDE - Abnormal; Notable for the following components:      Result Value   B Natriuretic Peptide 598.0 (*)    All other components within normal limits  I-STAT TROPONIN, ED - Abnormal; Notable for the following components:   Troponin i, poc 1.30 (*)    All other components within normal limits  BASIC METABOLIC PANEL  CBC  HEPARIN LEVEL (UNFRACTIONATED)  CBG MONITORING, ED    EKG  EKG Interpretation  Date/Time:  Thursday October 25 2018 09:23:26 EST Ventricular Rate:  78 PR Interval:    QRS Duration: 125 QT Interval:  391 QTC Calculation: 446 R Axis:   103 Text Interpretation:  Sinus rhythm Consider left ventricular hypertrophy Non-specific ST-t changes Confirmed by Virgel Manifold [phone]) on 10/25/2018 9:30:40 AM Also confirmed by Virgel Manifold [phone]), editor Hattie Perch [phone])  on 10/25/2018 9:54:08 AM   Radiology Dg Chest 2 View  Result Date: 10/25/2018 CLINICAL DATA:  Dry cough for 1 week. EXAM: CHEST - 2 VIEW COMPARISON:  10/22/2018 FINDINGS: Stable postsurgical changes from CABG. Cardiomediastinal silhouette is normal. Mediastinal contours appear intact. There is no evidence of focal airspace consolidation, pleural effusion or pneumothorax. Osseous structures are without acute abnormality. Soft tissues are grossly normal. IMPRESSION: No active cardiopulmonary disease. Electronically Signed   By: Fidela Salisbury M.D.   On: 10/25/2018 10:08    Procedures .Critical Care Performed by: Kinnie Feil, PA-C Authorized by: Kinnie Feil, PA-C   Critical care provider statement:    Critical care time (minutes):  45   Critical care was necessary to treat or prevent imminent or life-threatening deterioration of the following conditions:  Cardiac failure (elevated troponin)   Critical care was time spent personally by me on the following activities:  Discussions with consultants, evaluation of patient's response to treatment, examination of patient, ordering and performing treatments and interventions, ordering and review of laboratory studies, ordering and review of radiographic studies, pulse oximetry, re-evaluation of patient's condition, obtaining history from patient or surrogate, review of old charts and development of treatment plan with patient or surrogate   I assumed direction of critical care for this patient from another provider in my specialty: no     (including critical care time)  Medications Ordered in ED Medications  sodium chloride flush (NS) 0.9 % injection 3 mL (0 mLs Intravenous Hold 10/25/18 0925)  heparin bolus via infusion 4,000 Units (0 Units Intravenous Hold 10/25/18 0958)  heparin ADULT infusion 100 units/mL (25000 units/250mL sodium chloride 0.45%) (1,200 Units/hr Intravenous New Bag/Given 10/25/18 1009)  aspirin  chewable tablet 324 mg (324 mg Oral Given 10/25/18 0931)  heparin injection 4,000 Units (4,000 Units Intravenous Given 10/25/18 0954)  nitroGLYCERIN (NITROGLYN) 2 % ointment 2 inch (2 inches Topical Given 10/25/18 0953)  metoprolol tartrate (LOPRESSOR) injection 5 mg (5 mg Intravenous Given 10/25/18 0959)     Initial Impression / Assessment and Plan / ED Course  I have reviewed the triage vital signs and the nursing notes.  Pertinent labs & imaging results that were available during my care of the patient were reviewed by me and considered in my medical decision making (see chart for details).  Clinical Course as of Oct 26 1055  Thu Oct 25, 2018  0934 TWI in inferior/lateral leads  EKG 12-Lead [CG]  1000 Troponin i, poc(!!): 1.30 [CG]  1037 Repage cards   [CG]    Clinical Course User Index [CG] Kinnie Feil, PA-C    Concern for ACS/unstable angina in this patient. Possible NSTEMI. I reviewed patient's last admission for NSTEMI at [facility].  LHC showed occluded proximal SVG-PDA that did not appear acute and aggressive med management was recommended by cardiology. Echo showed EF 45-50% and WMA. EKG at that admission did have TWI in inferior leads and LVH.    1000: EKG with TWI and LVH. Trop 1.30.  CXR with some vascular congestion.  We will consult cardiology for admission of elevated troponin,  NSTEMI unstable angina. HD stable, arrived with substernal 5/10 CP.  Pending BNP, CXR, BMP. Given nitroglycerin paste, ASA, heparin in ER.  Will f/u on CXR/BNP.    Hospitalist will admit to SDU.  Final Clinical Impressions(s) / ED Diagnoses   Final diagnoses:  Elevated troponin    ED Discharge Orders    None       Arlean Hopping 10/25/18 1057    Virgel Manifold, MD 10/26/18 1358

## 2018-10-26 ENCOUNTER — Inpatient Hospital Stay (HOSPITAL_COMMUNITY): Payer: Medicare Other

## 2018-10-26 ENCOUNTER — Encounter (HOSPITAL_COMMUNITY)
Admission: EM | Disposition: A | Discharge status: Home / Self Care | Payer: Self-pay | Source: Home / Self Care | Attending: Internal Medicine

## 2018-10-26 DIAGNOSIS — N183 Chronic kidney disease, stage 3 (moderate): Secondary | ICD-10-CM

## 2018-10-26 DIAGNOSIS — Z794 Long term (current) use of insulin: Secondary | ICD-10-CM

## 2018-10-26 DIAGNOSIS — Z7189 Other specified counseling: Secondary | ICD-10-CM

## 2018-10-26 DIAGNOSIS — I10 Essential (primary) hypertension: Secondary | ICD-10-CM

## 2018-10-26 DIAGNOSIS — I214 Non-ST elevation (NSTEMI) myocardial infarction: Principal | ICD-10-CM

## 2018-10-26 DIAGNOSIS — I2510 Atherosclerotic heart disease of native coronary artery without angina pectoris: Secondary | ICD-10-CM

## 2018-10-26 DIAGNOSIS — Z951 Presence of aortocoronary bypass graft: Secondary | ICD-10-CM

## 2018-10-26 DIAGNOSIS — E1122 Type 2 diabetes mellitus with diabetic chronic kidney disease: Secondary | ICD-10-CM

## 2018-10-26 DIAGNOSIS — I361 Nonrheumatic tricuspid (valve) insufficiency: Secondary | ICD-10-CM

## 2018-10-26 DIAGNOSIS — I340 Nonrheumatic mitral (valve) insufficiency: Secondary | ICD-10-CM

## 2018-10-26 DIAGNOSIS — R7989 Other specified abnormal findings of blood chemistry: Secondary | ICD-10-CM

## 2018-10-26 DIAGNOSIS — I5043 Acute on chronic combined systolic (congestive) and diastolic (congestive) heart failure: Secondary | ICD-10-CM

## 2018-10-26 DIAGNOSIS — E7849 Other hyperlipidemia: Secondary | ICD-10-CM

## 2018-10-26 HISTORY — PX: RIGHT/LEFT HEART CATH AND CORONARY/GRAFT ANGIOGRAPHY: CATH118267

## 2018-10-26 LAB — POCT I-STAT 7, (LYTES, BLD GAS, ICA,H+H)
Acid-base deficit: 1 mmol/L (ref 0.0–2.0)
Bicarbonate: 23.9 mmol/L (ref 20.0–28.0)
Calcium, Ion: 1.19 mmol/L (ref 1.15–1.40)
HCT: 35 % — ABNORMAL LOW (ref 39.0–52.0)
Hemoglobin: 11.9 g/dL — ABNORMAL LOW (ref 13.0–17.0)
O2 Saturation: 94 %
PO2 ART: 70 mmHg — AB (ref 83.0–108.0)
Potassium: 4 mmol/L (ref 3.5–5.1)
Sodium: 140 mmol/L (ref 135–145)
TCO2: 25 mmol/L (ref 22–32)
pCO2 arterial: 37.9 mmHg (ref 32.0–48.0)
pH, Arterial: 7.407 (ref 7.350–7.450)

## 2018-10-26 LAB — POCT I-STAT EG7
Bicarbonate: 24.9 mmol/L (ref 20.0–28.0)
Calcium, Ion: 1.19 mmol/L (ref 1.15–1.40)
HCT: 35 % — ABNORMAL LOW (ref 39.0–52.0)
Hemoglobin: 11.9 g/dL — ABNORMAL LOW (ref 13.0–17.0)
O2 Saturation: 64 %
PH VEN: 7.418 (ref 7.250–7.430)
Potassium: 4.1 mmol/L (ref 3.5–5.1)
SODIUM: 141 mmol/L (ref 135–145)
TCO2: 26 mmol/L (ref 22–32)
pCO2, Ven: 38.5 mmHg — ABNORMAL LOW (ref 44.0–60.0)
pO2, Ven: 33 mmHg (ref 32.0–45.0)

## 2018-10-26 LAB — CBC
HCT: 34.7 % — ABNORMAL LOW (ref 39.0–52.0)
Hemoglobin: 11.5 g/dL — ABNORMAL LOW (ref 13.0–17.0)
MCH: 29 pg (ref 26.0–34.0)
MCHC: 33.1 g/dL (ref 30.0–36.0)
MCV: 87.4 fL (ref 80.0–100.0)
Platelets: 165 10*3/uL (ref 150–400)
RBC: 3.97 MIL/uL — ABNORMAL LOW (ref 4.22–5.81)
RDW: 14.2 % (ref 11.5–15.5)
WBC: 8.7 10*3/uL (ref 4.0–10.5)
nRBC: 0 % (ref 0.0–0.2)

## 2018-10-26 LAB — BASIC METABOLIC PANEL
Anion gap: 8 (ref 5–15)
BUN: 15 mg/dL (ref 8–23)
CHLORIDE: 106 mmol/L (ref 98–111)
CO2: 25 mmol/L (ref 22–32)
Calcium: 8.6 mg/dL — ABNORMAL LOW (ref 8.9–10.3)
Creatinine, Ser: 1.43 mg/dL — ABNORMAL HIGH (ref 0.61–1.24)
GFR calc Af Amer: 58 mL/min — ABNORMAL LOW (ref >60)
GFR calc non Af Amer: 50 mL/min — ABNORMAL LOW (ref >60)
Glucose, Bld: 129 mg/dL — ABNORMAL HIGH (ref 70–99)
Potassium: 3.8 mmol/L (ref 3.5–5.1)
Sodium: 139 mmol/L (ref 135–145)

## 2018-10-26 LAB — HIV ANTIBODY (ROUTINE TESTING W REFLEX): HIV Screen 4th Generation wRfx: NONREACTIVE

## 2018-10-26 LAB — TROPONIN I: Troponin I: 1.03 ng/mL (ref <0.03)

## 2018-10-26 LAB — GLUCOSE, CAPILLARY
GLUCOSE-CAPILLARY: 280 mg/dL — AB (ref 70–99)
Glucose-Capillary: 118 mg/dL — ABNORMAL HIGH (ref 70–99)
Glucose-Capillary: 171 mg/dL — ABNORMAL HIGH (ref 70–99)
Glucose-Capillary: 79 mg/dL (ref 70–99)
Glucose-Capillary: 90 mg/dL (ref 70–99)
Glucose-Capillary: 90 mg/dL (ref 70–99)

## 2018-10-26 LAB — HEPARIN LEVEL (UNFRACTIONATED): Heparin Unfractionated: 0.27 IU/mL — ABNORMAL LOW (ref 0.30–0.70)

## 2018-10-26 LAB — ECHOCARDIOGRAM COMPLETE
Height: 72 in
Weight: 4003.55 oz

## 2018-10-26 SURGERY — RIGHT/LEFT HEART CATH AND CORONARY/GRAFT ANGIOGRAPHY
Anesthesia: LOCAL

## 2018-10-26 MED ORDER — HEPARIN (PORCINE) IN NACL 1000-0.9 UT/500ML-% IV SOLN
INTRAVENOUS | Status: DC | PRN
  Administered 2018-10-26 (×2): 500 mL

## 2018-10-26 MED ORDER — MIDAZOLAM HCL 2 MG/2ML IJ SOLN
INTRAMUSCULAR | Status: AC
  Filled 2018-10-26: qty 2

## 2018-10-26 MED ORDER — FENTANYL CITRATE (PF) 100 MCG/2ML IJ SOLN
INTRAMUSCULAR | Status: AC
  Filled 2018-10-26: qty 2

## 2018-10-26 MED ORDER — IOHEXOL 350 MG/ML SOLN
INTRAVENOUS | Status: DC | PRN
  Administered 2018-10-26: 65 mL via INTRA_ARTERIAL

## 2018-10-26 MED ORDER — FENTANYL CITRATE (PF) 100 MCG/2ML IJ SOLN
INTRAMUSCULAR | Status: DC | PRN
  Administered 2018-10-26: 25 ug via INTRAVENOUS

## 2018-10-26 MED ORDER — MIDAZOLAM HCL 2 MG/2ML IJ SOLN
INTRAMUSCULAR | Status: DC | PRN
  Administered 2018-10-26: 1 mg via INTRAVENOUS

## 2018-10-26 MED ORDER — LIDOCAINE HCL (PF) 1 % IJ SOLN
INTRAMUSCULAR | Status: DC | PRN
  Administered 2018-10-26: 15 mL via SUBCUTANEOUS

## 2018-10-26 MED ORDER — PERFLUTREN LIPID MICROSPHERE
1.0000 mL | INTRAVENOUS | Status: AC | PRN
  Administered 2018-10-26: 2 mL via INTRAVENOUS
  Filled 2018-10-26: qty 10

## 2018-10-26 MED ORDER — LIDOCAINE HCL (PF) 1 % IJ SOLN
INTRAMUSCULAR | Status: AC
  Filled 2018-10-26: qty 30

## 2018-10-26 MED ORDER — HEPARIN (PORCINE) IN NACL 1000-0.9 UT/500ML-% IV SOLN
INTRAVENOUS | Status: AC
  Filled 2018-10-26: qty 1000

## 2018-10-26 SURGICAL SUPPLY — 10 items
CATH INFINITI 5FR MULTPACK ANG (CATHETERS) ×2 IMPLANT
CATH SWAN GANZ 7F STRAIGHT (CATHETERS) ×2 IMPLANT
CLOSURE MYNX CONTROL 5F (Vascular Products) ×2 IMPLANT
KIT HEART LEFT (KITS) ×2 IMPLANT
PACK CARDIAC CATHETERIZATION (CUSTOM PROCEDURE TRAY) ×2 IMPLANT
SHEATH PINNACLE 5F 10CM (SHEATH) ×2 IMPLANT
SHEATH PINNACLE 7F 10CM (SHEATH) ×2 IMPLANT
SHEATH PROBE COVER 6X72 (BAG) ×2 IMPLANT
TRANSDUCER W/STOPCOCK (MISCELLANEOUS) ×2 IMPLANT
WIRE EMERALD 3MM-J .035X150CM (WIRE) ×2 IMPLANT

## 2018-10-26 NOTE — Progress Notes (Signed)
Site area: RFArtery Site Prior to Removal:  Level 0 Pressure Applied For:17 minutes Manual:   yes Patient Status During Pull:  Stable, tolerated well Post Pull Site:  Level 0 Post Pull Instructions Given  yes Post Pull Pulses Present: :right DPpalpable Dressing Applied:  tegaderm and gauze applied Bedrest begins @ 13:50 Comments:

## 2018-10-26 NOTE — Progress Notes (Signed)
Progress Note  Patient Name: Russell Trevino Date of Encounter: 10/26/2018  Primary Cardiologist: No primary care provider on file.  Dr. Mauri [place] at [facility] patient says that his breathing is better when he is up moving around however he did have some mild orthopnea last night.  His edema is slightly better but still present in his feet/ankles.  He is having no chest pain or pressure.  He is scheduled for a left and right heart cath today.   Inpatient Medications    Scheduled Meds: . amLODipine  5 mg Oral Daily  . atorvastatin  80 mg Oral q1800  . finasteride  5 mg Oral Daily  . furosemide  80 mg Intravenous BID  . gabapentin  200 mg Oral QHS  . insulin aspart  0-9 Units Subcutaneous Q4H  . insulin glargine  20 Units Subcutaneous QHS  . lisinopril  40 mg Oral Daily  . metoprolol succinate  50 mg Oral Daily  . potassium chloride  40 mEq Oral BID  . sildenafil  20 mg Oral TID  . sodium chloride flush  3 mL Intravenous Once  . sodium chloride flush  3 mL Intravenous Q12H  . spironolactone  12.5 mg Oral Daily  . ticagrelor  90 mg Oral BID   Continuous Infusions: . sodium chloride    . sodium chloride 50 mL/hr at 10/26/18 0553  . heparin 1,500 Units/hr (10/26/18 0548)  . nitroGLYCERIN 10 mcg/min (10/25/18 1247)   PRN Meds: sodium chloride, acetaminophen **OR** acetaminophen, ondansetron **OR** ondansetron (ZOFRAN) IV, perflutren lipid microspheres (DEFINITY) IV suspension, sodium chloride flush   Vital Signs    Vitals:   10/25/18 2030 10/26/18 0017 10/26/18 0456 10/26/18 0808  BP: 122/62 119/64 120/68 132/73  Pulse: 64 67 61 62  Resp: (!) 22 20 18 14   Temp: (!) 97.3 F (36.3 C) 97.8 F (36.6 C) 98 F (36.7 C) 97.8 F (36.6 C)  TempSrc: Oral Oral Oral Oral  SpO2: 96% 95% 98% 100%  Weight:   113.5 kg   Height:        Intake/Output Summary (Last 24 hours) at 10/26/2018 1116 Last data filed at 10/26/2018 0759 Gross per  24 hour  Intake 419.62 ml  Output 1500 ml  Net -1080.38 ml   Last 3 Weights 10/26/2018 10/25/2018 10/25/2018  Weight (lbs) 250 lb 3.6 oz 254 lb 1.6 oz 250 lb  Weight (kg) 113.5 kg 115.259 kg 113.399 kg      Telemetry    Sinus rhythm in the 80s- Personally Reviewed  ECG    No new tracings- Personally Reviewed  Physical Exam   GEN: No acute distress.   Neck: No JVD Cardiac: RRR, no murmurs, rubs, or gallops.  Respiratory: Clear to auscultation bilaterally. GI: Soft, nontender, non-distended  MS:  Trace-1+ lower leg edema; No deformity. Neuro:  Nonfocal  Psych: Normal affect   Labs    Chemistry Recent Labs  Lab 10/22/18 1149 10/25/18 0925 10/26/18 0220  NA 137 140 139  K 3.9 3.9 3.8  CL 102 104 106  CO2 28 22 25   GLUCOSE 246* 97 129*  BUN 22 13 15   CREATININE 1.26 1.21 1.43*  CALCIUM 9.0 8.9 8.6*  PROT 6.9  --   --   ALBUMIN 3.7  --   --   AST 23  --   --   ALT 13  --   --   ALKPHOS 81  --   --  BILITOT 1.8*  --   --   GFRNONAA  --  >60 50*  GFRAA  --  >60 58*  ANIONGAP  --  14 8     Hematology Recent Labs  Lab 10/22/18 1149 10/25/18 0925 10/26/18 0220  WBC 10.0 9.3 8.7  RBC 4.46 4.74 3.97*  HGB 13.3 13.8 11.5*  HCT 39.0 42.0 34.7*  MCV 87.4 88.6 87.4  MCH  --  29.1 29.0  MCHC 34.2 32.9 33.1  RDW 14.5 14.1 14.2  PLT 183.0 197 165    Cardiac Enzymes Recent Labs  Lab 10/25/18 1442 10/25/18 2043 10/26/18 0220  TROPONINI 1.33* 1.36* 1.03*    Recent Labs  Lab 10/25/18 0931  TROPIPOC 1.30*     BNP Recent Labs  Lab 10/25/18 0925  BNP 598.0*     DDimer No results for input(s): DDIMER in the last 168 hours.   Radiology    Dg Chest 2 View  Result Date: 10/25/2018 CLINICAL DATA:  Dry cough for 1 week. EXAM: CHEST - 2 VIEW COMPARISON:  10/22/2018 FINDINGS: Stable postsurgical changes from CABG. Cardiomediastinal silhouette is normal. Mediastinal contours appear intact. There is no evidence of focal airspace consolidation, pleural  effusion or pneumothorax. Osseous structures are without acute abnormality. Soft tissues are grossly normal. IMPRESSION: No active cardiopulmonary disease. Electronically Signed   By: Fidela Salisbury M.D.   On: 10/25/2018 10:08    Cardiac Studies   Echo in progress  Plan for right and left heart cath today  ECHO: 01/05/2018 SUMMARY  The left ventricle is moderately dilated.  There is normal left ventricular wall thickness.    Left ventricular systolic function is borderline reduced.  LV ejection fraction = 45-50%.  There are regional wall motion abnormalities as specified below.  The right ventricle is normal in size and function.  There is no significant valvular stenosis or regurgitation  There is no pericardial effusion.  IVC size was mildly dilated.  Borderline dilated ascending aorta.  Probably no significant change in comparison with the prior study of   01/09/2012.   FINDINGS:    LEFT VENTRICLE  The left ventricle is moderately dilated. There is normal left ventricular   wall thickness. Left ventricular systolic function is borderline reduced. LV   ejection fraction = 45-50%. Left ventricular filling pattern is prolonged   relaxation. There are regional wall motion abnormalities as specified below.   There is mid LV septal wall mild hypokinesis. There is mid LV anterior septal   hypokinesis. There is basal LV anterior wall mild hypokinesis.    RIGHT VENTRICLE  The right ventricle is normal in size and function.    LEFT ATRIUM  The left atrial size is normal.    RIGHT ATRIUM  Right atrial size is normal.  AORTIC VALVE  There is aortic valve sclerosis. The aortic valve is trileaflet. The aortic   valve opens well. There is no aortic stenosis. There is no aortic   regurgitation.  MITRAL VALVE  The mitral valve is normal in structure and function. There is trace mitral   regurgitation.  TRICUSPID VALVE  The tricuspid  valve is normal in structure and function. There is trace   tricuspid regurgitation.  PULMONIC VALVE  The pulmonic valve is not well visualized. Trace pulmonic valvular   regurgitation.  ARTERIES  The aortic root is normal size. Borderline dilated ascending aorta.  VENOUS  Pulmonary venous flow pattern is normal. IVC size was mildly dilated.  EFFUSION  There is no pericardial  effusion.   CATH: 01/05/2018 In view of history of left radial harvest for CABG - access was via right  femoral artery. Ultrasound guided micro puncture technique used to insert 21F sheath via  right femoral artery. Patient has a lot of pannus and sheath is just above the bifurcation.  Native coronaries: 100% prox LAD 100% mid CX 100% prox RCA Grafts: Patent LIMA-LAD Patent left radial from coming off mid LIMA to OM and diagonal  100% proximal SVG-PDA - does not appear acute. Distal RCA/PDA fills via L-R collaterals. Did not see any region that would benefit from PCI DES Stopped at diagnostic.   Patient Profile     68 y.o. male with a hx of NSTEMI>>CABG 2006 w/ LIMA-LAD, L radial-OM-D1 and SVG-PDA. ICM w/ EF now 50-55%, DM2, HTN, HLD, NSTEMI w/ CHF 12/2017  he was admitted for evaluation of SOB and chest pain.  Assessment & Plan    NSTEMI -Troponins mildly elevated with peak of 1.36, early flat pattern. -Patient was seen by Dr. Haroldine Laws yesterday for consultation who felt that patient's symptoms are most likely related to heart failure and not ACS but accelerating heartburn with elevated troponin raises a concern.  The patient is for right and left heart cath today. -Echocardiogram has just been done, pending results. -He continues on Brilinta, aspirin, statin, eta blocker  Volume overload, likely acute on chronic combined systolic and diastolic CHF -Elevated BNP 598, lower extremity edema and dyspnea on exertion. -Patient is being diuresed with Lasix 80 mg IV twice daily with good  urine output.  1.5 L urine output documented and there is at least 400 mL of urine in urinal at the bedside.  Weight is down 4 pounds from yesterday. -Dyspnea on exertion and lower extremity edema are improving. -Continue diuresis.  Awaiting results of right heart cath for further management plan.  Hypertension -On amlodipine, lisinopril and metoprolol succinate -Blood pressure is currently well controlled. (Pt also on sildenafil 20 mg TID)  Hyperlipidemia -Continue high intensity statin. -LDL goal should be <70 with his CAD.  His current LDL is 150.  The patient should be considered for PCSK9 inhibitor therapy as an outpatient to try to get to goal.  Diabetes -On insulin, management per primary team -Hemoglobin is 9.9 and has been as high as 12 in the past couple of years.  This is poorly controlled diabetes.  Recommend a target of A1c less than 7 to reduce his future cardiac risk.     For questions or updates, please contact [facility] Please consult [email or web address] for contact info under        Signed, Daune Perch, NP  10/26/2018, 11:16 AM

## 2018-10-26 NOTE — Progress Notes (Signed)
  Echocardiogram 2D Echocardiogram definitiy has been performed.  Darlina Sicilian M 10/26/2018, 11:05 AM

## 2018-10-26 NOTE — Plan of Care (Signed)
  Problem: Education: Goal: Knowledge of General Education information will improve Description Including pain rating scale, medication(s)/side effects and non-pharmacologic comfort measures Outcome: Progressing   Problem: Clinical Measurements: Goal: Ability to maintain clinical measurements within normal limits will improve Outcome: Progressing Note:  VSS. Goal: Will remain free from infection Outcome: Progressing Note:  No s/s of infection noted.

## 2018-10-26 NOTE — Progress Notes (Addendum)
PROGRESS NOTE    Russell Trevino  UXN:[id number] DOB: [birth year] DOA: 10/25/2018 PCP: Binnie Rail, MD   Brief Narrative:  HPI per Dr. Dessa Phi on 10/25/2018 Russell Trevino is a 68 y.o. male with medical history significant of CAD s/p CABG, DM, HLD who presents with 3-week history of heartburn.  He was evaluated by PCP, was referred to GI.  He states that his heartburn was constant in nature, associated with gas.  His over-the-counter medications did not help.  He then started having shortness of breath yesterday, worsening with exertion, barely able to walk up 3-4 steps which is not his usual baseline functional status.  He was recommended to be evaluated in the emergency department.  He has a cardiologist at [facility], who is no longer with [facility], and so patient needs a new cardiologist.  Currently in the emergency department, he is chest pain-free, shortness of breath has improved with Nitropaste.  Denies any nausea or vomiting.  Some abdominal soreness, no diarrhea.  He does have chronic left greater than right peripheral edema.  **Admitted for CP and underwent Cardiac Catheterization today showed mid LM to distal LM lesion that is 95% stenosed, proximal LAD lesion that is 100% stenosed, proximal circumflex lesion is 100% stenosed, and proximal RCA to distal RCA lesion is 100% stenosed.  Cardiology recommended medical therapy for CHF and switched IV Lasix to p.o. and start IV nitroglycerin and IV heparin.  Cardiology reviewed cath results with the patient and also discussed with the surgeon the patient has Entresto.  Assessment & Plan:   Principal Problem:   NSTEMI (non-ST elevated myocardial infarction) ([place]) Active Problems:   Hx of CABG   Chronic coronary artery disease   Essential hypertension, benign   Hyperlipidemia   Diabetes ([place])  NSTEMI -Cardiology consulted and took the patient for Cardiac Catheterization today -1.  1.36 and then trended  downwards -Echocardiogram done and pending results -Nitro paste, Aspirin given in ED  -Heparin gtt was initiated and now stopped  -Continue Brilinta. -Dr. Harrell Gave discussed about changing lisinopril to [facility] about the patient as well as a PCSK9 inhibitor -Continue with atorvastatin 80 mg p.o. daily, aspirin 81 mg p.o. daily, furosemide 80 mg mg p.o. twice daily, lisinopril 40 mg daily, and metoprolol succinate 50 mill daily -Further care per cardiology  Hyperlipidemia -LDL 150 -Continue atorvastatin -Cardiology discussing about PCSK 9 inhibitor as an outpatient to try to get a goal  Essential Hypertension -Continue furosemide 80 mg p.o. twice daily, Toprol succinate 50 mg p.o. daily, lisinopril 40 mg daily, and amlodipine -Patient also takes sildenafil 20 mg 3 times daily  Acute on chronic chronic systolic heart failure -EF 45 to 50% in April 2019 -Continue Lasix and his physician to p.o. to 80 mg twice daily -Strict I's and O's and daily weights -Echocardiogram done and pending read -Audiology discussing about stopping lisinopril and start Entresto  Diabetes mellitus type 2, uncontrolled with hyperglycemia -Hold metformin currently  -Hemoglobin A1c was 9.9 on 10/22/2018 -Continue Lantus, sliding scale insulin NovoLog -CBG's ranging from 118-171 -Check HbA1c in AM   Normocytic Anemia -Patient's hemoglobin/hematocrit went from 11.5/34.7 is now 11.9/35.7 -Check anemia panel in a.m. -Continue to monitor for signs and symptoms bleeding -Repeat CBC in the a.m.  Renal Insufficiency/CKD Stage 3 -Patient's creatinine bumped up from yesterday -Creatinine is now 1.40 -Continue monitor and avoid nephrotoxic medications possible -We will continue Neupro currently -Did receive contrast dye for his cardiac catheterization -We will repeat CMP in  a.m. and monitor renal function carefully  Obesity -Estimated body mass index is 33.94 kg/m as calculated from the following:    Height as of this encounter: 6' (1.829 m).   Weight as of this encounter: 113.5 kg. -Weight Loss Counseling given  DVT prophylaxis: Was on a heparin drip and this is now been discontinued; SCDs Code Status: FULL CODE Family Communication: No family present at bedside Disposition Plan: Anticipate discharge home in the next 24 to 48 hours pending cardiac clearance  Consultants:   Cardiology   Procedures:  CARDIAC CATHETERIZATION  Mid LM to Dist LM lesion is 95% stenosed.  Prox LAD lesion is 100% stenosed.  Prox Cx lesion is 100% stenosed.  Prox RCA to Dist RCA lesion is 100% stenosed.  LV end diastolic pressure is normal.  Hemodynamic findings consistent with mild pulmonary hypertension.   1. Occlusive native vessel CAD.     -95% distal left main    - 100% proximal LAD after the first septal perforator    - 100% LCx after a very small OM1.    - 100% proximal RCA 2. Patent LIMA to the LAD 3. Patent free radial graft arising from the mid LIMA graft and supplying the first diagonal and OM 4. SVG to RCA is known to be occluded. The distal RCA is supplied by collaterals.  5. Normal LV filling pressures 6. Mild pulmonary HTN. 7. Preserved cardiac output.  Plan: continue medical therapy for CHF. Will switch IV lasix to po. Stop IV Ntg and heparin.    ECHOCARDIOGRAM Done but pending read    Antimicrobials:  Anti-infectives (From admission, onward)   None     Subjective: Seen and Examined prior to cardiac catheterization and states his chest pain had much improved.  No nausea or vomiting.  States he had no lightheadedness or dizziness.  States that he is feeling better.  No other concerns or complaints at this time and ready for cardiac catheterization.  Objective: Vitals:   10/26/18 1350 10/26/18 1355 10/26/18 1400 10/26/18 1405  BP: (!) 165/73   (!) 160/107  Pulse: 66 65 60 75  Resp: 19 18 13 17   Temp:      TempSrc:      SpO2: 100% 99% 99% 100%  Weight:       Height:        Intake/Output Summary (Last 24 hours) at 10/26/2018 1807 Last data filed at 10/26/2018 1310 Gross per 24 hour  Intake 354.75 ml  Output 1800 ml  Net -1445.25 ml   Filed Weights   10/25/18 0921 10/25/18 1433 10/26/18 0456  Weight: 113.4 kg 115.3 kg 113.5 kg   Examination: Physical Exam:  Constitutional: WN/WD obese Caucasian male in NAD and appears calm and comfortable Eyes: PERRL, lids and conjunctivae normal, sclerae anicteric  ENMT: External Ears, Nose appear normal. Grossly normal hearing. Mucous membranes are moist.  Neck: Appears normal, supple, no cervical masses, normal ROM, no appreciable thyromegaly; no JVD Respiratory: Clear to auscultation bilaterally, no wheezing, rales, rhonchi or crackles. Normal respiratory effort and patient is not tachypenic. No accessory muscle use.  Cardiovascular: RRR, no murmurs / rubs / gallops. S1 and S2 auscultated. Trace-1+ LE extremity edema.  Abdomen: Soft, non-tender, Distended due to body habitus. No masses palpated. No appreciable hepatosplenomegaly. Bowel sounds positive x4.  GU: Deferred. Musculoskeletal: No clubbing / cyanosis of digits/nails. No joint deformity upper and lower extremities.  Skin: No rashes, lesions, ulcers on a limited skin evaluation. No induration; Warm and dry.  Neurologic: CN 2-12 grossly intact with no focal deficits.  Romberg sign and cerebellar reflexes not assessed.  Psychiatric: Normal judgment and insight. Alert and oriented x 3. Normal mood and appropriate affect.   Data Reviewed: I have personally reviewed following labs and imaging studies  CBC: Recent Labs  Lab 10/22/18 1149 10/25/18 0925 10/26/18 0220 10/26/18 1237 10/26/18 1239  WBC 10.0 9.3 8.7  --   --   NEUTROABS 7.6  --   --   --   --   HGB 13.3 13.8 11.5* 11.9* 11.9*  HCT 39.0 42.0 34.7* 35.0* 35.0*  MCV 87.4 88.6 87.4  --   --   PLT 183.0 197 165  --   --    Basic Metabolic Panel: Recent Labs  Lab 10/22/18 1149  10/25/18 0925 10/26/18 0220 10/26/18 1237 10/26/18 1239  NA 137 140 139 140 141  K 3.9 3.9 3.8 4.0 4.1  CL 102 104 106  --   --   CO2 28 22 25   --   --   GLUCOSE 246* 97 129*  --   --   BUN 22 13 15   --   --   CREATININE 1.26 1.21 1.43*  --   --   CALCIUM 9.0 8.9 8.6*  --   --    GFR: Estimated Creatinine Clearance: 64.3 mL/min (A) (by C-G formula based on SCr of 1.43 mg/dL (H)). Liver Function Tests: Recent Labs  Lab 10/22/18 1149  AST 23  ALT 13  ALKPHOS 81  BILITOT 1.8*  PROT 6.9  ALBUMIN 3.7   No results for input(s): LIPASE, AMYLASE in the last 168 hours. No results for input(s): AMMONIA in the last 168 hours. Coagulation Profile: No results for input(s): INR, PROTIME in the last 168 hours. Cardiac Enzymes: Recent Labs  Lab 10/25/18 1442 10/25/18 2043 10/26/18 0220  TROPONINI 1.33* 1.36* 1.03*   BNP (last 3 results) Recent Labs    01/04/18 0907  PROBNP 398.0*   HbA1C: No results for input(s): HGBA1C in the last 72 hours. CBG: Recent Labs  Lab 10/26/18 0012 10/26/18 0509 10/26/18 0807 10/26/18 1346 10/26/18 1708  GLUCAP 79 90 90 118* 171*   Lipid Profile: No results for input(s): CHOL, HDL, LDLCALC, TRIG, CHOLHDL, LDLDIRECT in the last 72 hours. Thyroid Function Tests: No results for input(s): TSH, T4TOTAL, FREET4, T3FREE, THYROIDAB in the last 72 hours. Anemia Panel: No results for input(s): VITAMINB12, FOLATE, FERRITIN, TIBC, IRON, RETICCTPCT in the last 72 hours. Sepsis Labs: No results for input(s): PROCALCITON, LATICACIDVEN in the last 168 hours.  No results found for this or any previous visit (from the past 240 hour(s)).    Radiology Studies: Dg Chest 2 View  Result Date: 10/25/2018 CLINICAL DATA:  Dry cough for 1 week. EXAM: CHEST - 2 VIEW COMPARISON:  10/22/2018 FINDINGS: Stable postsurgical changes from CABG. Cardiomediastinal silhouette is normal. Mediastinal contours appear intact. There is no evidence of focal airspace  consolidation, pleural effusion or pneumothorax. Osseous structures are without acute abnormality. Soft tissues are grossly normal. IMPRESSION: No active cardiopulmonary disease. Electronically Signed   By: Fidela Salisbury M.D.   On: 10/25/2018 10:08   Scheduled Meds: . amLODipine  5 mg Oral Daily  . atorvastatin  80 mg Oral q1800  . finasteride  5 mg Oral Daily  . furosemide  80 mg Intravenous BID  . gabapentin  200 mg Oral QHS  . insulin aspart  0-9 Units Subcutaneous Q4H  . insulin glargine  20 Units  Subcutaneous QHS  . lisinopril  40 mg Oral Daily  . metoprolol succinate  50 mg Oral Daily  . potassium chloride  40 mEq Oral BID  . sildenafil  20 mg Oral TID  . sodium chloride flush  3 mL Intravenous Once  . spironolactone  12.5 mg Oral Daily  . ticagrelor  90 mg Oral BID   Continuous Infusions:   LOS: 1 day   Kerney Elbe, DO [facility] PAGER is on [place]  If 7PM-7AM, please contact night-coverage [email or web address] Password [facility] 10/26/2018, 6:07 PM

## 2018-10-26 NOTE — Interval H&P Note (Signed)
History and Physical Interval Note:  10/26/2018 12:01 PM  Russell Trevino  has presented today for surgery, with the diagnosis of n stemi  The various methods of treatment have been discussed with the patient and family. After consideration of risks, benefits and other options for treatment, the patient has consented to  Procedure(s): RIGHT/LEFT HEART CATH AND CORONARY/GRAFT ANGIOGRAPHY (N/A) as a surgical intervention .  The patient's history has been reviewed, patient examined, no change in status, stable for surgery.  I have reviewed the patient's chart and labs.  Questions were answered to the patient's satisfaction.    Cath Lab Visit (complete for each Cath Lab visit)  Clinical Evaluation Leading to the Procedure:   ACS: Yes.    Non-ACS:    Anginal Classification: CCS III  Anti-ischemic medical therapy: Maximal Therapy (2 or more classes of medications)  Non-Invasive Test Results: No non-invasive testing performed  Prior CABG: Previous CABG       Collier Salina [facility] 10/26/2018 12:01 PM

## 2018-10-26 NOTE — Progress Notes (Signed)
ANTICOAGULATION CONSULT NOTE  Pharmacy Consult for Heparin Indication: chest pain/ACS  No Known Allergies  Patient Measurements: Height: 6' (182.9 cm) Weight: 254 lb 1.6 oz (115.3 kg) IBW/kg (Calculated) : 77.6 Heparin Dosing Weight: 102 kg  Vital Signs: Temp: 97.8 F (36.6 C) (02/14 0017) Temp Source: Oral (02/14 0017) BP: 119/64 (02/14 0017) Pulse Rate: 67 (02/14 0017)  Labs: Recent Labs    10/25/18 0925 10/25/18 1442 10/25/18 1806 10/25/18 2043 10/26/18 0220  HGB 13.8  --   --   --  11.5*  HCT 42.0  --   --   --  34.7*  PLT 197  --   --   --  165  HEPARINUNFRC  --   --  0.35  --  0.27*  CREATININE 1.21  --   --   --  1.43*  TROPONINI  --  1.33*  --  1.36*  --     Estimated Creatinine Clearance: 64.8 mL/min (A) (by C-G formula based on SCr of 1.43 mg/dL (H)).  Assessment: 78 YOM with exertional CP and dyspnea.  Pharmacy consulted to dose heparin for ACS.  Heparin level is now slightly subtherapeutic. No bleeding or other issues noted.   Goal of Therapy:  Heparin level 0.3-0.7 units/ml Monitor platelets by anticoagulation protocol: Yes   Plan:  Increase heparin gtt to 1500 units/hr Check an 8 hr heparin level Daily heparin level and CBC  Salome Arnt, PharmD, BCPS Please see AMION for all pharmacy numbers 10/26/2018 2:55 AM

## 2018-10-27 DIAGNOSIS — E782 Mixed hyperlipidemia: Secondary | ICD-10-CM

## 2018-10-27 LAB — CBC WITH DIFFERENTIAL/PLATELET
Abs Immature Granulocytes: 0.03 10*3/uL (ref 0.00–0.07)
Basophils Absolute: 0 10*3/uL (ref 0.0–0.1)
Basophils Relative: 1 %
Eosinophils Absolute: 0.3 10*3/uL (ref 0.0–0.5)
Eosinophils Relative: 3 %
HEMATOCRIT: 43.3 % (ref 39.0–52.0)
Hemoglobin: 14 g/dL (ref 13.0–17.0)
Immature Granulocytes: 0 %
LYMPHS PCT: 16 %
Lymphs Abs: 1.3 10*3/uL (ref 0.7–4.0)
MCH: 28.7 pg (ref 26.0–34.0)
MCHC: 32.3 g/dL (ref 30.0–36.0)
MCV: 88.9 fL (ref 80.0–100.0)
Monocytes Absolute: 0.8 10*3/uL (ref 0.1–1.0)
Monocytes Relative: 11 %
Neutro Abs: 5.5 10*3/uL (ref 1.7–7.7)
Neutrophils Relative %: 69 %
Platelets: 194 10*3/uL (ref 150–400)
RBC: 4.87 MIL/uL (ref 4.22–5.81)
RDW: 14.3 % (ref 11.5–15.5)
WBC: 7.9 10*3/uL (ref 4.0–10.5)
nRBC: 0 % (ref 0.0–0.2)

## 2018-10-27 LAB — COMPREHENSIVE METABOLIC PANEL
ALBUMIN: 3.3 g/dL — AB (ref 3.5–5.0)
ALT: 15 U/L (ref 0–44)
AST: 17 U/L (ref 15–41)
Alkaline Phosphatase: 77 U/L (ref 38–126)
Anion gap: 9 (ref 5–15)
BUN: 18 mg/dL (ref 8–23)
CO2: 27 mmol/L (ref 22–32)
Calcium: 9.2 mg/dL (ref 8.9–10.3)
Chloride: 103 mmol/L (ref 98–111)
Creatinine, Ser: 1.56 mg/dL — ABNORMAL HIGH (ref 0.61–1.24)
GFR calc Af Amer: 52 mL/min — ABNORMAL LOW (ref >60)
GFR, EST NON AFRICAN AMERICAN: 45 mL/min — AB (ref >60)
Glucose, Bld: 170 mg/dL — ABNORMAL HIGH (ref 70–99)
Potassium: 4.2 mmol/L (ref 3.5–5.1)
Sodium: 139 mmol/L (ref 135–145)
Total Bilirubin: 2 mg/dL — ABNORMAL HIGH (ref 0.3–1.2)
Total Protein: 7 g/dL (ref 6.5–8.1)

## 2018-10-27 LAB — GLUCOSE, CAPILLARY
GLUCOSE-CAPILLARY: 161 mg/dL — AB (ref 70–99)
Glucose-Capillary: 155 mg/dL — ABNORMAL HIGH (ref 70–99)
Glucose-Capillary: 165 mg/dL — ABNORMAL HIGH (ref 70–99)
Glucose-Capillary: 191 mg/dL — ABNORMAL HIGH (ref 70–99)

## 2018-10-27 LAB — PHOSPHORUS: Phosphorus: 3.5 mg/dL (ref 2.5–4.6)

## 2018-10-27 LAB — MAGNESIUM: Magnesium: 1.8 mg/dL (ref 1.7–2.4)

## 2018-10-27 MED ORDER — METOPROLOL SUCCINATE ER 50 MG PO TB24: 50.0000 mg | ORAL_TABLET | Freq: Every day | ORAL | 0 refills | Status: DC

## 2018-10-27 MED ORDER — FUROSEMIDE 80 MG PO TABS: 80.0000 mg | ORAL_TABLET | Freq: Every day | ORAL | Status: DC

## 2018-10-27 MED ORDER — FUROSEMIDE 80 MG PO TABS: 80.0000 mg | ORAL_TABLET | Freq: Every day | ORAL | 0 refills | Status: DC

## 2018-10-27 MED ORDER — SPIRONOLACTONE 25 MG PO TABS: 12.5000 mg | ORAL_TABLET | Freq: Every day | ORAL | 0 refills | Status: DC

## 2018-10-27 MED ORDER — POTASSIUM CHLORIDE CRYS ER 20 MEQ PO TBCR: 20.0000 meq | EXTENDED_RELEASE_TABLET | Freq: Two times a day (BID) | ORAL | 0 refills | Status: DC

## 2018-10-27 NOTE — Progress Notes (Signed)
Progress Note  Patient Name: Russell Trevino Date of Encounter: 10/27/2018  Primary Cardiologist: Dr. Harrell Gave (new)  Subjective   Doing well this AM. Reviewed medication plans from yesterday. Spent >25 min doing heart failure education teaching re: scales, sodium, fluid, medication, and activity. All questions answered.  Inpatient Medications    Scheduled Meds: . amLODipine  5 mg Oral Daily  . atorvastatin  80 mg Oral q1800  . finasteride  5 mg Oral Daily  . furosemide  80 mg Intravenous BID  . gabapentin  200 mg Oral QHS  . insulin aspart  0-9 Units Subcutaneous Q4H  . insulin glargine  20 Units Subcutaneous QHS  . lisinopril  40 mg Oral Daily  . metoprolol succinate  50 mg Oral Daily  . potassium chloride  40 mEq Oral BID  . sildenafil  20 mg Oral TID  . sodium chloride flush  3 mL Intravenous Once  . spironolactone  12.5 mg Oral Daily  . ticagrelor  90 mg Oral BID   Continuous Infusions:  PRN Meds: acetaminophen **OR** acetaminophen, ondansetron **OR** ondansetron (ZOFRAN) IV   Vital Signs    Vitals:   10/26/18 2137 10/27/18 0026 10/27/18 0351 10/27/18 0729  BP: (!) 145/64 139/85 (!) 144/73 117/90  Pulse: 68 67 70 69  Resp: 16 16 17  (!) 6  Temp: 98.7 F (37.1 C) 98.4 F (36.9 C) 98.4 F (36.9 C)   TempSrc: Oral Oral Oral   SpO2: 96% 99% 98%   Weight:   64.6 kg   Height:        Intake/Output Summary (Last 24 hours) at 10/27/2018 1035 Last data filed at 10/27/2018 0800 Gross per 24 hour  Intake -  Output 2000 ml  Net -2000 ml   Last 3 Weights 10/27/2018 10/26/2018 10/25/2018  Weight (lbs) 142 lb 8 oz 250 lb 3.6 oz 254 lb 1.6 oz  Weight (kg) 64.638 kg 113.5 kg 115.259 kg      Telemetry    NSR - Personally Reviewed  ECG    NSR, IVCD - Personally Reviewed  Physical Exam   GEN: No acute distress.   Neck: No JVD Cardiac: RRR, no murmurs, rubs, or gallops.  Respiratory: Clear to auscultation bilaterally. GI: Soft, nontender, non-distended    MS: trace LE bilateral edema; No deformity. Neuro:  Nonfocal  Psych: Normal affect   Labs    Chemistry Recent Labs  Lab 10/22/18 1149 10/25/18 0925 10/26/18 0220 10/26/18 1237 10/26/18 1239 10/27/18 0819  NA 137 140 139 140 141 139  K 3.9 3.9 3.8 4.0 4.1 4.2  CL 102 104 106  --   --  103  CO2 28 22 25   --   --  27  GLUCOSE 246* 97 129*  --   --  170*  BUN 22 13 15   --   --  18  CREATININE 1.26 1.21 1.43*  --   --  1.56*  CALCIUM 9.0 8.9 8.6*  --   --  9.2  PROT 6.9  --   --   --   --  7.0  ALBUMIN 3.7  --   --   --   --  3.3*  AST 23  --   --   --   --  17  ALT 13  --   --   --   --  15  ALKPHOS 81  --   --   --   --  77  BILITOT 1.8*  --   --   --   --  2.0*  GFRNONAA  --  >60 50*  --   --  45*  GFRAA  --  >60 58*  --   --  52*  ANIONGAP  --  14 8  --   --  9     Hematology Recent Labs  Lab 10/25/18 0925 10/26/18 0220 10/26/18 1237 10/26/18 1239 10/27/18 0819  WBC 9.3 8.7  --   --  7.9  RBC 4.74 3.97*  --   --  4.87  HGB 13.8 11.5* 11.9* 11.9* 14.0  HCT 42.0 34.7* 35.0* 35.0* 43.3  MCV 88.6 87.4  --   --  88.9  MCH 29.1 29.0  --   --  28.7  MCHC 32.9 33.1  --   --  32.3  RDW 14.1 14.2  --   --  14.3  PLT 197 165  --   --  194    Cardiac Enzymes Recent Labs  Lab 10/25/18 1442 10/25/18 2043 10/26/18 0220  TROPONINI 1.33* 1.36* 1.03*    Recent Labs  Lab 10/25/18 0931  TROPIPOC 1.30*     BNP Recent Labs  Lab 10/25/18 0925  BNP 598.0*     DDimer No results for input(s): DDIMER in the last 168 hours.   Radiology    No results found.  Cardiac Studies   Echo 10/26/18  1. The left ventricle has mild-moderately reduced systolic function, with an ejection fraction of 40-45%. The cavity size was severely dilated. Left ventricular diastolic Doppler parameters are consistent with pseudonormalization Elevated left  ventricular end-diastolic pressure.  2. Poor images preclude accurate assessment of wall motion even with definity.  3. The right  ventricle has normal systolic function. The cavity was normal. There is no increase in right ventricular wall thickness.  4. Left atrial size was moderately dilated.  5. The mitral valve is normal in structure. Mitral valve regurgitation is moderate by color flow Doppler.  6. The tricuspid valve is normal in structure.  7. The aortic valve is tricuspid.  8. The pulmonic valve was normal in structure.  9. There is mild dilatation of the ascending aorta measuring 38 mm. 10. Right atrial pressure is estimated at 3 mmHg.  Cath 10/26/18  Mid LM to Dist LM lesion is 95% stenosed.  Prox LAD lesion is 100% stenosed.  Prox Cx lesion is 100% stenosed.  Prox RCA to Dist RCA lesion is 100% stenosed.  LV end diastolic pressure is normal.  Hemodynamic findings consistent with mild pulmonary hypertension.   1. Occlusive native vessel CAD.     -95% distal left main    - 100% proximal LAD after the first septal perforator    - 100% LCx after a very small OM1.    - 100% proximal RCA 2. Patent LIMA to the LAD 3. Patent free radial graft arising from the mid LIMA graft and supplying the first diagonal and OM 4. SVG to RCA is known to be occluded. The distal RCA is supplied by collaterals.  5. Normal LV filling pressures 6. Mild pulmonary HTN. 7. Preserved cardiac output.  Plan: continue medical therapy for CHF. Will switch IV lasix to po. Stop IV Ntg and heparin.   Patient Profile     68 y.o. male with a hx of NSTEMI>>CABG 2006 w/LIMA-LAD,Lradial-OM-D1andSVG-PDA. ICM w/ EF now 40-45%, DM2, HTN, HLD, NSTEMI w/ CHF 12/2017 who is being followed for chest pain and dyspnea.  Assessment & Plan    NSTEMI: S/p cath 2/14, no acute intervention -continue aspirin,  ticagrelor, atorvastatin 80 mg, metoprolol succinate 50 mg daily -does not live locally, travels, not interested in cardiac rehab at this time -echo done as noted  Acute on chronic systolic and diastolic heart failure, ischemic  cardiomyopathy -continue lisinopril 40 mg, furosemide (changed to 80 mg daily), metoprolol succinate 50 mg daily, spironolactone 12.5 mg daily -see summary below re: plans for med adjustments, HF education  Hyperlipidemia: on max atorvastatin, LDL 150 with goal of 70 -see plans for addition of PCKS9 inhibitor, below -continue atorvastatin 80 mg daily  Hypertension: at goal today -continue meds as above  Diabetes, type II: management per primary team -Trulicity very expensive for him, not on anymore -see below re: jardiance  [facility] will sign off.   Medication Recommendations:  As ordered and noted above Other recommendations (labs, testing, etc):  BMET in 1 week Follow up as an outpatient:  I've requested for him to follow up with me, will be called with appt on 2/17.  Please include the following with his discharge information: Heart failure education: Do the following things EVERY DAY:  1. Weigh yourself EVERY morning after you go to the bathroom but before you eat or drink anything. Write this number down in a weight log/diary. If you gain 3 pounds overnight or 5 pounds in a week, call the office.  2. Take your medicines as prescribed. If you have concerns about your medications, please call us before you stop taking them.   3. Eat low salt foods-Limit salt (sodium) to 2000 mg per day. This will help prevent your body from holding onto fluid. Read food labels as many processed foods have a lot of sodium, especially canned goods and prepackaged meats. If you would like some assistance choosing low sodium foods, we would be happy to set you up with a nutritionist.  4. Stay as active as you can everyday. Staying active will give you more energy and make your muscles stronger. Start with 5 minutes at a time and work your way up to 30 minutes a day. Break up your activities--do some in the morning and some in the afternoon. Start with 3 days per week and work your way up to 5  days as you can.  If you have chest pain, feel short of breath, dizzy, or lightheaded, STOP. If you don't feel better after a short rest, call 911. If you do feel better, call the office to let us know you have symptoms with exercise.  5. Limit all fluids for the day to less than 2 liters. Fluid includes all drinks, coffee, juice, ice chips, soup, jello, and all other liquids.   Plan going forward is to discuss the below medications during outpatient follow up: -changing lisinopril to entresto. -PCSK9 inhibitor, given that LDL should be <70 and is currently on atorvastatin 80 mg.  -Vascepa given CAD/MI and triglycerides.  -Jardiance given CAD, HF, and diabetes.   For questions or updates, please contact [place] Please consult [email or web address] for contact info under     Signed, Buford Dresser, MD  10/27/2018, 10:35 AM   TIME SPENT WITH PATIENT: 25 minutes of direct patient care. More than 50% of that time was spent on coordination of care and counseling regarding heart failure mangement.  Buford Dresser, MD, PhD [facility]

## 2018-10-27 NOTE — Discharge Instructions (Signed)
Chronic Kidney Disease, Adult Chronic kidney disease (CKD) happens when the kidneys are damaged over a long period of time. The kidneys are two organs that help with:  Getting rid of waste and extra fluid from the blood.  Making hormones that maintain the amount of fluid in your tissues and blood vessels.  Making sure that the body has the right amount of fluids and chemicals. Most of the time, CKD does not go away, but it can usually be controlled. Steps must be taken to slow down the kidney damage or to stop it from getting worse. If this is not done, the kidneys may stop working. Follow these instructions at home: Medicines  Take over-the-counter and prescription medicines only as told by your doctor. You may need to change the amount of medicines you take.  Do not take any new medicines unless your doctor says it is okay. Many medicines can make your kidney damage worse.  Do not take any vitamin and supplements unless your doctor says it is okay. Many vitamins and supplements can make your kidney damage worse. General instructions  Follow a diet as told by your doctor. You may need to stay away from: ? Alcohol. ? Salty foods. ? Foods that are high in:  Potassium.  Calcium.  Protein.  Do not use any products that contain nicotine or tobacco, such as cigarettes and e-cigarettes. If you need help quitting, ask your doctor.  Keep track of your blood pressure at home. Tell your doctor about any changes.  If you have diabetes, keep track of your blood sugar as told by your doctor.  Try to stay at a healthy weight. If you need help, ask your doctor.  Exercise at least 30 minutes a day, 5 days a week.  Stay up-to-date with your shots (immunizations) as told by your doctor.  Keep all follow-up visits as told by your doctor. This is important. Contact a doctor if:  Your symptoms get worse.  You have new symptoms. Get help right away if:  You have symptoms of end-stage  kidney disease. These may include: ? Headaches. ? Numbness in your hands or feet. ? Easy bruising. ? Having hiccups often. ? Chest pain. ? Shortness of breath. ? Stopping of menstrual periods in women.  You have a fever.  You have very little pee (urine).  You have pain or bleeding when you pee. Summary  Chronic kidney disease (CKD) happens when the kidneys are damaged over a long period of time.  Most of the time, this condition does not go away, but it can usually be controlled. Steps must be taken to slow down the kidney damage or to stop it from getting worse.  Treatment may include a combination of medicines and lifestyle changes. This information is not intended to replace advice given to you by your health care provider. Make sure you discuss any questions you have with your health care provider. Document Released: 11/23/2009 Document Revised: 10/03/2016 Document Reviewed: 10/03/2016 [employer] Interactive Patient Education  2019 [employer]  

## 2018-10-29 ENCOUNTER — Telehealth: Payer: Self-pay | Admitting: *Deleted

## 2018-10-29 ENCOUNTER — Encounter (HOSPITAL_COMMUNITY): Payer: Self-pay | Admitting: Cardiology

## 2018-10-29 NOTE — Discharge Summary (Signed)
Physician Discharge Summary  Russell Trevino ZMO:[id number] DOB: July 25, 1951 DOA: 10/25/2018  PCP: Binnie Rail, MD  Admit date: 10/25/2018 Discharge date: 10/29/2018  Admitted From: Home Disposition: Home  Recommendations for Outpatient Follow-up:  1. Follow up with PCP in 1-2 weeks 2. Follow up with Cardiology within 1-2 weeks; Appointment already scheduled 10/29/2018 for for follow up for 3 weeks 3. Please obtain CMP/CBC, Mag, Phos in one week 4. Please follow up on the following pending results:  Home Health: No Equipment/Devices: None   Discharge Condition: Stable  CODE STATUS: FULL CODE Diet recommendation: Heart Healthy Carb Modified Diet   Brief/Interim Summary: HPI per Dr. Dessa Phi on 10/25/2018 Russell Trevino a 67 y.o.malewith medical history significant ofCAD s/p CABG, DM, HLD who presents with3-week history of heartburn.He was evaluated by PCP, was referred to GI. He states that his heartburn was constant in nature, associated with gas. His over-the-counter medications did not help. He then started having shortness of breath yesterday, worsening with exertion, barely able to walk up 3-4 steps which is not his usual baseline functional status. He was recommended to be evaluated in the emergency department. He has a cardiologist at [facility], who is no longer with [facility],and so patient needs a new cardiologist. Currently in the emergency department, he is chest pain-free, shortness of breath has improved with Nitropaste. Denies any nausea or vomiting. Some abdominal soreness, no diarrhea. He does have chronic left greater than right peripheral edema.  Hospital Course *Admitted for CP and underwent Cardiac Catheterization today showed mid LM to distal LM lesion that is 95% stenosed, proximal LAD lesion that is 100% stenosed, proximal circumflex lesion is 100% stenosed, and proximal RCA to distal RCA lesion is 100% stenosed.  Cardiology recommended  medical therapy for CHF and switched IV Lasix to p.o. and start IV nitroglycerin and IV heparin.  Cardiology reviewed cath results with the patient and also discussed with him about switching to [facility]. There was no intervention fromt he cath and he was medically managed. He was diuresed and improved. Cardiology followed and set up appointment and he is going to discuss with Cardiology about several medicine changes in the outpatient setting.  To follow-up with PCP as well as cardiology in outpatient setting  Discharge Diagnoses:  Principal Problem:   NSTEMI (non-ST elevated myocardial infarction) ([place]) Active Problems:   Hx of CABG   Chronic coronary artery disease   Essential hypertension, benign   Hyperlipidemia   Diabetes ([place])  NSTEMI -Cardiology consulted and took the patient for Cardiac Catheterization with no Intervention  -Troponin I peaked at 1.36 and then trended downwards -Echocardiogram done and showed ejection fraction of 40-45%. The cavity size was severely dilated. Left ventricular diastolic Doppler parameters are consistent with pseudonormalization Elevated left ventricular end-diastolic pressure -Nitro paste, Aspirin given in ED  -Heparin gtt was initiated and now stopped  -Continue Brilinta. -Dr. Harrell Gave discussed about changing lisinopril to [facility] about the patient as well as a PCSK9 inhibitor -Continue with atorvastatin 80 mg p.o. daily, aspirin 81 mg p.o. daily, furosemide 80 mg mg p.o. twice daily, lisinopril 40 mg daily, and metoprolol succinate 50 mg daily -Further care per cardiology and ECHO as below -Plan is to adjust medications as an outpatient   Hyperlipidemia -LDL 150 -Continue Atorvastatin 80 m po Daily  -Cardiology discussing about PCSK 9 inhibitor as an outpatient to try to get a goal  Essential Hypertension -Continue furosemide 80 mg p.o. twice daily, Toprol succinate 50 mg p.o. daily,  lisinopril 40 mg daily, and amlodipine -Patient also  takes sildenafil 20 mg 3 times daily -Cardiology to change to [facility] as an outapatient   Acute on chronic chronic systolic heart failure -EF 45 to 50% in April 2019 -BNP was 598.0 on Admission  -Continue Lasix and his physician to p.o. to 80 mg twice daily -Strict I's and O's and daily weights -Echocardiogram done and pending read -Audiology discussing about stopping lisinopril and start Entresto -Diuresed and will be placed on 80 mg po Daily -Follow up in Cardiac Clinic    Diabetes mellitus type 2, uncontrolled with hyperglycemia -Hold metformin currently  -Hemoglobin A1c was 9.9 on 10/22/2018 -Continue Lantus, sliding scale insulin NovoLog -CBG's ranging from 155-165 -Follow up with PCP; Cardiology will discuss about starting Jardiance   Normocytic Anemia -Patient's hemoglobin/hematocrit went from 11.5/34.7 is now 14.0/43.3 -Check anemia panel as an outpatient   -Continue to monitor for signs and symptoms bleeding -Repeat CBC as an outpatient   Renal Insufficiency/CKD Stage 3 -Patient's creatinine bumped up from yesterday -Creatinine is now worsened to 1.56 -Continue monitor and avoid nephrotoxic medications possible -We will continue Lisinopril currently -Did receive contrast dye for his cardiac catheterization -We will repeat CMP in a.m. and monitor renal function carefully -Follow up Renal Fxn at PCP and Cardiology office and if worsens will advised to hold Lisinopril; Continue Lasix per Cardiology   Obesity -Estimated body mass index is 19.33 kg/m as calculated from the following:   Height as of this encounter: 6' (1.829 m).   Weight as of this encounter: 64.6 kg. -Weight Loss Counseling given  Discharge Instructions  Discharge Instructions    (HEART FAILURE PATIENTS) Call MD:  Anytime you have any of the following symptoms: 1) 3 pound weight gain in 24 hours or 5 pounds in 1 week 2) shortness of breath, with or without a dry hacking cough 3) swelling in the  hands, feet or stomach 4) if you have to sleep on extra pillows at night in order to breathe.   Complete by:  As directed    Call MD for:   Complete by:  As directed    Call MD for:  difficulty breathing, headache or visual disturbances   Complete by:  As directed    Call MD for:  extreme fatigue   Complete by:  As directed    Call MD for:  hives   Complete by:  As directed    Call MD for:  persistant dizziness or light-headedness   Complete by:  As directed    Call MD for:  persistant nausea and vomiting   Complete by:  As directed    Call MD for:  redness, tenderness, or signs of infection (pain, swelling, redness, odor or green/yellow discharge around incision site)   Complete by:  As directed    Call MD for:  severe uncontrolled pain   Complete by:  As directed    Call MD for:  temperature >100.4   Complete by:  As directed    Diet - low sodium heart healthy   Complete by:  As directed    Diet Carb Modified   Complete by:  As directed    Discharge instructions   Complete by:  As directed    You were cared for by a hospitalist during your hospital stay. If you have any questions about your discharge medications or the care you received while you were in the hospital after you are discharged, you can call the unit  and ask to speak with the hospitalist on call if the hospitalist that took care of you is not available. Once you are discharged, your primary care physician will handle any further medical issues. Please note that NO REFILLS for any discharge medications will be authorized once you are discharged, as it is imperative that you return to your primary care physician (or establish a relationship with a primary care physician if you do not have one) for your aftercare needs so that they can reassess your need for medications and monitor your lab values.  Follow up with PCP and Cardiology in the outpatient. Take all medications as prescribed. If symptoms change or worsen please  return to the ED for evaluation   Increase activity slowly   Complete by:  As directed      Heart failure Education per Cardiology  Do the following things EVERY DAY:  1. Weigh yourself EVERY morning after you go to the bathroom but before you eat or drink anything. Write this number down in a weight log/diary. If you gain 3 pounds overnight or 5 pounds in a week, call the office.  2. Take your medicines as prescribed. If you have concerns about your medications, please call us before you stop taking them.   3. Eat low salt foods-Limit salt (sodium) to 2000 mg per day. This will help prevent your body from holding onto fluid. Read food labels as many processed foods have a lot of sodium, especially canned goods and prepackaged meats. If you would like some assistance choosing low sodium foods, we would be happy to set you up with a nutritionist.  4. Stay as active as you can everyday. Staying active will give you more energy and make your muscles stronger. Start with 5 minutes at a time and work your way up to 30 minutes a day. Break up your activities--do some in the morning and some in the afternoon. Start with 3 days per week and work your way up to 5 days as you can. If you have chest pain, feel short of breath, dizzy, or lightheaded, STOP. If you don't feel better after a short rest, call 911. If you do feel better, call the office to let us know you have symptoms with exercise.  5. Limit all fluids for the day to less than 2 liters. Fluid includes all drinks, coffee, juice, ice chips, soup, jello, and all other liquids.    Allergies as of 10/27/2018   No Known Allergies     Medication List    STOP taking these medications   carvedilol 6.25 MG tablet Commonly known as:  COREG     TAKE these medications   amLODipine 5 MG tablet Commonly known as:  NORVASC Take 1 tablet (5 mg total) by mouth daily. What changed:  when to take this   aspirin 81 MG tablet Take 81 mg by  mouth daily at 12 noon.   atorvastatin 80 MG tablet Commonly known as:  LIPITOR Take 1 tablet (80 mg total) by mouth daily. What changed:  when to take this   BD VEO INSULIN SYRINGE U/F 31G X 15/64" 1 ML Misc Generic drug:  Insulin Syringe-Needle U-100 use as directed TO INJECT INSULIN  twice a day   BRILINTA 90 MG Tabs tablet Generic drug:  ticagrelor Take 90 mg by mouth daily at 12 noon.   dexlansoprazole 60 MG capsule Commonly known as:  DEXILANT Take 1 capsule (60 mg total) by mouth daily before breakfast. What  changed:    when to take this  additional instructions   finasteride 5 MG tablet Commonly known as:  PROSCAR Take 1 tablet (5 mg total) by mouth daily. What changed:    when to take this  additional instructions   furosemide 80 MG tablet Commonly known as:  LASIX Take 1 tablet (80 mg total) by mouth daily. What changed:    medication strength  how much to take   gabapentin 100 MG capsule Commonly known as:  NEURONTIN Take 2 capsules (200 mg total) by mouth at bedtime. What changed:    how much to take  when to take this  additional instructions   insulin NPH-regular Human (70-30) 100 UNIT/ML injection Commonly known as:  HUMULIN 70/30 90 units with breakfast, and 55 units with supper What changed:    how much to take  how to take this  when to take this  additional instructions   lisinopril 40 MG tablet Commonly known as:  PRINIVIL,ZESTRIL Take 1 tablet (40 mg total) by mouth daily. What changed:    when to take this  additional instructions   metFORMIN 1000 MG tablet Commonly known as:  GLUCOPHAGE Take 1 tablet (1,000 mg total) by mouth 2 (two) times daily with a meal. What changed:    when to take this  additional instructions   metoprolol succinate 50 MG 24 hr tablet Commonly known as:  TOPROL-XL Take 1 tablet (50 mg total) by mouth daily. Take with or immediately following a meal. What changed:    when to take  this  additional instructions   nitroGLYCERIN 0.4 MG SL tablet Commonly known as:  NITROSTAT Place 0.4 mg under the tongue as needed.   potassium chloride SA 20 MEQ tablet Commonly known as:  K-DUR,KLOR-CON Take 1 tablet (20 mEq total) by mouth 2 (two) times daily.   sildenafil 20 MG tablet Commonly known as:  REVATIO TAKE 1 TABLET BY MOUTH THREE TIMES DAILY What changed:    when to take this  reasons to take this   spironolactone 25 MG tablet Commonly known as:  ALDACTONE Take 0.5 tablets (12.5 mg total) by mouth daily.   sucralfate 1 g tablet Commonly known as:  CARAFATE TAKE 1 TABLET(1 GRAM) BY MOUTH FOUR TIMES DAILY AT BEDTIME WITH MEALS What changed:  See the new instructions.       No Known Allergies  Consultations:  Cardiology  Procedures/Studies: Dg Chest 2 View  Result Date: 10/25/2018 CLINICAL DATA:  Dry cough for 1 week. EXAM: CHEST - 2 VIEW COMPARISON:  10/22/2018 FINDINGS: Stable postsurgical changes from CABG. Cardiomediastinal silhouette is normal. Mediastinal contours appear intact. There is no evidence of focal airspace consolidation, pleural effusion or pneumothorax. Osseous structures are without acute abnormality. Soft tissues are grossly normal. IMPRESSION: No active cardiopulmonary disease. Electronically Signed   By: Fidela Salisbury M.D.   On: 10/25/2018 10:08   Dg Chest 2 View  Result Date: 10/22/2018 CLINICAL DATA:  Shortness of breath on exertion for 4 days. EXAM: CHEST - 2 VIEW COMPARISON:  January 04, 2018 and August 17, 2016 FINDINGS: No pneumothorax. Stable cardiomegaly. The hila and mediastinum are unchanged. Chronic scar atelectasis at the left base. No new infiltrate, nodule, or mass. IMPRESSION: No acute interval change. Electronically Signed   By: Dorise Bullion III M.D   On: 10/22/2018 15:12    ECHOCARDIOGRAM IMPRESSIONS    1. The left ventricle has mild-moderately reduced systolic function, with an ejection fraction of  40-45%. The  cavity size was severely dilated. Left ventricular diastolic Doppler parameters are consistent with pseudonormalization Elevated left  ventricular end-diastolic pressure.  2. Poor images preclude accurate assessment of wall motion even with definity.  3. The right ventricle has normal systolic function. The cavity was normal. There is no increase in right ventricular wall thickness.  4. Left atrial size was moderately dilated.  5. The mitral valve is normal in structure. Mitral valve regurgitation is moderate by color flow Doppler.  6. The tricuspid valve is normal in structure.  7. The aortic valve is tricuspid.  8. The pulmonic valve was normal in structure.  9. There is mild dilatation of the ascending aorta measuring 38 mm. 10. Right atrial pressure is estimated at 3 mmHg.  FINDINGS  Left Ventricle: The left ventricle has mild-moderately reduced systolic function, with an ejection fraction of 40-45%. The cavity size was severely dilated. There is no increase in left ventricular wall thickness. Left ventricular diastolic Doppler  parameters are consistent with pseudonormalization Elevated left ventricular end-diastolic pressure Definity contrast agent was given IV to delineate the left ventricular endocardial borders. Poor images preclude accurate assessment of wall motion even  with definity. Right Ventricle: The right ventricle has normal systolic function. The cavity was normal. There is no increase in right ventricular wall thickness. Left Atrium: left atrial size was moderately dilated Right Atrium: right atrial size was normal in size Right atrial pressure is estimated at 3 mmHg. Interatrial Septum: No atrial level shunt detected by color flow Doppler. Pericardium: There is no evidence of pericardial effusion. Mitral Valve: The mitral valve is normal in structure. Mitral valve regurgitation is moderate by color flow Doppler. Tricuspid Valve: The tricuspid valve is normal  in structure. Tricuspid valve regurgitation is mild by color flow Doppler. Aortic Valve: The aortic valve is tricuspid Aortic valve regurgitation was not visualized by color flow Doppler. Pulmonic Valve: The pulmonic valve was normal in structure. Pulmonic valve regurgitation is trivial by color flow Doppler. Aorta: There is mild dilatation of the ascending aorta measuring 38 mm. Venous: The inferior vena cava is normal in size with greater than 50% respiratory variability.   LEFT VENTRICLE PLAX 2D (Teich)              Biplane EF (MOD) LV EF:          35.8 %       LV Biplane EF:   48.1 % LVIDd:          6.42 cm      LV A4C EF:       55.1 % LVIDs:          5.29 cm      LV A2C EF:       40.7 % LV PW:          1.04 cm LV IVS:         0.82 cm      Diastology LVOT diam:      1.90 cm      LV e' lateral:   6.06 cm/s LV SV:          75 ml        LV E/e' lateral: 19.3 LVOT Area:      2.84 cm     LV e' medial:    3.26 cm/s                              LV E/e'  medial:  35.9 LV Volumes (MOD) LV area d, A2C:    45.60 cm LV area d, A4C:    44.50 cm LV area s, A2C:    29.80 cm LV area s, A4C:    26.40 cm LV major d, A2C:   9.08 cm LV major d, A4C:   9.72 cm LV major s, A2C:   7.05 cm LV major s, A4C:   7.67 cm LV vol d, MOD A2C: 194.0 ml LV vol d, MOD A4C: 174.0 ml LV vol s, MOD A2C: 115.0 ml LV vol s, MOD A4C: 78.1 ml LV SV MOD A2C:     79.0 ml LV SV MOD A4C:     174.0 ml LV SV MOD BP:      91.4 ml  RIGHT VENTRICLE RVSP:           33.2 mmHg  LEFT ATRIUM             Index       RIGHT ATRIUM           Index LA diam:        4.70 cm 2.01 cm/m  RA Pressure: 3 mmHg LA Vol (A2C):   48.1 ml 20.53 ml/m RA Area:     15.60 cm LA Vol (A4C):   70.8 ml 30.21 ml/m RA Volume:   45.00 ml  19.20 ml/m LA Biplane Vol: 64.0 ml 27.31 ml/m  AORTIC VALVE LVOT Vmax:   79.10 cm/s LVOT Vmean:  58.900 cm/s LVOT VTI:    0.157 m   AORTA Ao Root diam: 3.20 cm Ao Asc diam:  3.80 cm  MITRAL VALVE                TRICUSPID VALVE MV Area (PHT): 5.84 cm    TR Peak grad:   30.2 mmHg MV PHT:        37.70 msec  TR Vmax:        275.00 cm/s MV Decel Time: 130 msec    RVSP:           33.2 mmHg MR Peak grad: 111.5 mmHg MR Mean grad: 74.0 mmHg MR Vmax:      528.00 cm/s MR Vmean:     411.0 cm/s MV E velocity: 117.00 cm/s MV A velocity: 56.30 cm/s MV E/A ratio:  2.08  CARDIAC CATHETERIZATION  Mid LM to Dist LM lesion is 95% stenosed.  Prox LAD lesion is 100% stenosed.  Prox Cx lesion is 100% stenosed.  Prox RCA to Dist RCA lesion is 100% stenosed.  LV end diastolic pressure is normal.  Hemodynamic findings consistent with mild pulmonary hypertension.   1. Occlusive native vessel CAD.     -95% distal left main    - 100% proximal LAD after the first septal perforator    - 100% LCx after a very small OM1.    - 100% proximal RCA 2. Patent LIMA to the LAD 3. Patent free radial graft arising from the mid LIMA graft and supplying the first diagonal and OM 4. SVG to RCA is known to be occluded. The distal RCA is supplied by collaterals.  5. Normal LV filling pressures 6. Mild pulmonary HTN. 7. Preserved cardiac output.  Plan: continue medical therapy for CHF. Will switch IV lasix to po. Stop IV Ntg and heparin.    Subjective: Seen and examined at bedside is doing well.  Denies chest pain, lightheadedness or dizziness now.  States his legs have "never looked better".  Felt better  and denied any other concerns or complaints at this time is ready to go home.  Follow-up with cardiology and will be discussing several medication changes and an appointment with Dr. Michiel Sites on 10/29/2018.  Discharge Exam: Vitals:   10/27/18 0351 10/27/18 0729  BP: (!) 144/73 117/90  Pulse: 70 69  Resp: 17 (!) 6  Temp: 98.4 F (36.9 C)   SpO2: 98%    Vitals:   10/26/18 2137 10/27/18 0026 10/27/18 0351 10/27/18 0729  BP: (!) 145/64 139/85 (!) 144/73 117/90  Pulse: 68 67 70 69  Resp: 16 16  17  (!) 6  Temp: 98.7 F (37.1 C) 98.4 F (36.9 C) 98.4 F (36.9 C)   TempSrc: Oral Oral Oral   SpO2: 96% 99% 98%   Weight:   64.6 kg   Height:       General: Pt is alert, awake, not in acute distress Cardiovascular: RRR, S1/S2 +, no rubs, no gallops Respiratory: CTA bilaterally, no wheezing, no rhonchi Abdominal: Soft, NT, ND, bowel sounds + Extremities: no edema, no cyanosis  The results of significant diagnostics from this hospitalization (including imaging, microbiology, ancillary and laboratory) are listed below for reference.    Microbiology: No results found for this or any previous visit (from the past 240 hour(s)).   Labs: BNP (last 3 results) Recent Labs    10/25/18 0925  BNP 811.9*   Basic Metabolic Panel: Recent Labs  Lab 10/25/18 0925 10/26/18 0220 10/26/18 1237 10/26/18 1239 10/27/18 0819  NA 140 139 140 141 139  K 3.9 3.8 4.0 4.1 4.2  CL 104 106  --   --  103  CO2 22 25  --   --  27  GLUCOSE 97 129*  --   --  170*  BUN 13 15  --   --  18  CREATININE 1.21 1.43*  --   --  1.56*  CALCIUM 8.9 8.6*  --   --  9.2  MG  --   --   --   --  1.8  PHOS  --   --   --   --  3.5   Liver Function Tests: Recent Labs  Lab 10/27/18 0819  AST 17  ALT 15  ALKPHOS 77  BILITOT 2.0*  PROT 7.0  ALBUMIN 3.3*   No results for input(s): LIPASE, AMYLASE in the last 168 hours. No results for input(s): AMMONIA in the last 168 hours. CBC: Recent Labs  Lab 10/25/18 0925 10/26/18 0220 10/26/18 1237 10/26/18 1239 10/27/18 0819  WBC 9.3 8.7  --   --  7.9  NEUTROABS  --   --   --   --  5.5  HGB 13.8 11.5* 11.9* 11.9* 14.0  HCT 42.0 34.7* 35.0* 35.0* 43.3  MCV 88.6 87.4  --   --  88.9  PLT 197 165  --   --  194   Cardiac Enzymes: Recent Labs  Lab 10/25/18 1442 10/25/18 2043 10/26/18 0220  TROPONINI 1.33* 1.36* 1.03*   BNP: Invalid input(s): POCBNP CBG: Recent Labs  Lab 10/26/18 2152 10/27/18 0028 10/27/18 0354 10/27/18 0726 10/27/18 1133  GLUCAP  280* 191* 155* 161* 165*   D-Dimer No results for input(s): DDIMER in the last 72 hours. Hgb A1c No results for input(s): HGBA1C in the last 72 hours. Lipid Profile No results for input(s): CHOL, HDL, LDLCALC, TRIG, CHOLHDL, LDLDIRECT in the last 72 hours. Thyroid function studies No results for input(s): TSH, T4TOTAL, T3FREE, THYROIDAB in the last  72 hours.  Invalid input(s): FREET3 Anemia work up No results for input(s): VITAMINB12, FOLATE, FERRITIN, TIBC, IRON, RETICCTPCT in the last 72 hours. Urinalysis    Component Value Date/Time   COLORURINE RED BIOCHEMICALS MAY BE AFFECTED BY COLOR (A) 01/14/2010 2144   APPEARANCEUR CLOUDY (A) 01/14/2010 2144   LABSPEC 1.016 01/14/2010 2144   PHURINE 6.0 01/14/2010 2144   GLUCOSEU NEGATIVE 01/14/2010 2144   HGBUR LARGE (A) 01/14/2010 2144   BILIRUBINUR MODERATE (A) 01/14/2010 2144   KETONESUR TRACE (A) 01/14/2010 2144   PROTEINUR >300 (A) 01/14/2010 2144   UROBILINOGEN 1.0 01/14/2010 2144   NITRITE NEGATIVE 01/14/2010 2144   LEUKOCYTESUR SMALL (A) 01/14/2010 2144   Sepsis Labs Invalid input(s): PROCALCITONIN,  WBC,  LACTICIDVEN Microbiology No results found for this or any previous visit (from the past 240 hour(s)).  Time coordinating discharge: 35 minutes  SIGNED:  Kerney Elbe, DO [facility] 10/29/2018, 5:55 PM Pager is on [place]  If 7PM-7AM, please contact night-coverage [email or web address] Password [email or web address]

## 2018-10-29 NOTE — Telephone Encounter (Signed)
Transition Care Management Follow-up Telephone Call   Date discharged? 10/28/18   How have you been since you were released from the hospital? Pt states he seem to be doing ok   Do you understand why you were in the hospital? YES   Do you understand the discharge instructions? YES   Where were you discharged to? Home   Items Reviewed:  Medications reviewed: YES  Allergies reviewed: YES  Dietary changes reviewed: YES, heart healthy & carb modified   Referrals reviewed: YES, pt states he has appt w/cardiology 2/24   Functional Questionnaire:   Activities of Daily Living (ADLs):   He states he are independent in the following: ambulation, bathing and hygiene, feeding, continence, grooming, toileting and dressing States he doesn't require assistance    Any transportation issues/concerns?: NO   Any patient concerns? NO   Confirmed importance and date/time of follow-up visits scheduled YES, appt 11/08/18  Provider Appointment booked with Dr. Quay Burow  Confirmed with patient if condition begins to worsen call PCP or go to the ER.  Patient was given the office number and encouraged to call back with question or concerns.  : YES

## 2018-10-29 NOTE — Telephone Encounter (Signed)
Pt was on TCM report tried calling to make hosp f/u appt no answer LMOM RTC.Marland KitchenMarland Kitchen

## 2018-10-29 NOTE — Telephone Encounter (Signed)
Left message for patient to call and schedule 3 week post hospital visit with Dr. Harrell Gave

## 2018-11-05 ENCOUNTER — Encounter: Payer: Self-pay | Admitting: Cardiology

## 2018-11-05 ENCOUNTER — Ambulatory Visit: Payer: Medicare Other | Admitting: Cardiology

## 2018-11-05 ENCOUNTER — Encounter (INDEPENDENT_AMBULATORY_CARE_PROVIDER_SITE_OTHER): Payer: Self-pay

## 2018-11-05 VITALS — BP 128/62 | HR 70 | Ht 72.0 in | Wt 250.0 lb

## 2018-11-05 DIAGNOSIS — N183 Chronic kidney disease, stage 3 (moderate): Secondary | ICD-10-CM

## 2018-11-05 DIAGNOSIS — I255 Ischemic cardiomyopathy: Secondary | ICD-10-CM

## 2018-11-05 DIAGNOSIS — Z794 Long term (current) use of insulin: Secondary | ICD-10-CM

## 2018-11-05 DIAGNOSIS — I2584 Coronary atherosclerosis due to calcified coronary lesion: Secondary | ICD-10-CM | POA: Diagnosis not present

## 2018-11-05 DIAGNOSIS — Z951 Presence of aortocoronary bypass graft: Secondary | ICD-10-CM | POA: Diagnosis not present

## 2018-11-05 DIAGNOSIS — Z79899 Other long term (current) drug therapy: Secondary | ICD-10-CM

## 2018-11-05 DIAGNOSIS — I2510 Atherosclerotic heart disease of native coronary artery without angina pectoris: Secondary | ICD-10-CM

## 2018-11-05 DIAGNOSIS — I5042 Chronic combined systolic (congestive) and diastolic (congestive) heart failure: Secondary | ICD-10-CM | POA: Insufficient documentation

## 2018-11-05 DIAGNOSIS — E782 Mixed hyperlipidemia: Secondary | ICD-10-CM

## 2018-11-05 DIAGNOSIS — Z7189 Other specified counseling: Secondary | ICD-10-CM

## 2018-11-05 DIAGNOSIS — E1122 Type 2 diabetes mellitus with diabetic chronic kidney disease: Secondary | ICD-10-CM

## 2018-11-05 MED ORDER — SACUBITRIL-VALSARTAN 49-51 MG PO TABS: 1.0000 | ORAL_TABLET | Freq: Two times a day (BID) | ORAL | 11 refills | Status: DC

## 2018-11-05 NOTE — Progress Notes (Signed)
Cardiology Office Note:    Date:  11/05/2018   ID:  Russell Trevino, DOB 1951/04/02, MRN [id number]  PCP:  Binnie Rail, MD  Cardiologist:  Buford Dresser, MD PhD  Referring MD: Binnie Rail, MD   Chief Complaint  Patient presents with  . Follow-up  . Dizziness    When bending at times.  . Chest Pain    Feels more like heart burn.    History of Present Illness:    Russell Trevino is a 68 y.o. male with a hx of CAD s/p prior CABG, recent NSTEMI, ischemic cardiomyopathy with EF 40-45% who is seen as a new outpatient at the request of Burns, Claudina Lick, MD for the evaluation and management of the above issues. He was discharged from the hospital on 10/27/18 and is seen for a 14 day transition of care appointment. Medication reconciliation completed at this visit as well.  Cardiac history: Hospitalized 10/2018 for NSTEMI and heart failure symptoms. Severe native CAD, no intervenable targets on cath. Recommended for medication management. Prior history notable for NSTEMI in 2006 that lead to CABG, with LIMA0LAD, L Radial-OM-D1, SVG-PDA. Noted to have ischemic cardiomyopathy with EF 40-45%. He had another NSTEMI with heart failure in 12/2017.   Has weighed every morning, this AM was 242.8 on home scale. Weights have been steady. Has his heartburn/gassiness continued sensation but no other chest pain. Has been active without difficulty. No shortness of breath. No LE edema. Tolerating medications well. We discussed ideal med regimen option, and he also discussed with Tommy Medal, PharmD at his visit today.  Denies chest pain, shortness of breath at rest or with normal exertion. No PND, orthopnea, LE edema or unexpected weight gain. No syncope or palpitations.  Past Medical History:  Diagnosis Date  . BPH (benign prostatic hypertrophy)   . Cardiomyopathy, ischemic   . Coronary artery disease    at [facility], [place] 2009 (GRAFTS PATENT), cath 05/2018 w/ SVG-PDA 100%>>med rx  . Diabetes  mellitus, type 2 ([place])   . Frequency of urination   . Gross hematuria   . History of CHF (congestive heart failure) 2007  . History of kidney stones   . History of myocardial infarction 2006, 2019   2006>>CABG, 2019 cath w/ med rx  . History of urinary retention   . Hyperlipidemia   . Nocturia   . Renal calculi    BILATERAL PER CT SCAN--  NON-OBSTRUCTIVE  . S/P CABG x 4 2006    Past Surgical History:  Procedure Laterality Date  . CARDIAC CATHETERIZATION  01/17/2008   GRAFTS PATENT / EF 40%,  DR SANTOS ([facility])  . CORONARY ARTERY BYPASS GRAFT  04/2005   LIMA-LAD, L radial-OM-D1 and SVG-PDA  . CYSTO / RIGHT URETERAL STENT PLACEMENT  10-08-1999  . CYSTO/  REMOVAL BLADDER STONES  01-14-2010  . CYSTOSCOPY  10/16/2012   Procedure: CYSTOSCOPY;  Surgeon: Malka So, MD;  Location: [facility];  Service: Urology;  Laterality: N/A;  Cystoscopy with Clot Evacuation and fulguration bladder neck.  Marland Kitchen EXTRACORPOREAL SHOCK WAVE LITHOTRIPSY  10-15-2009   RIGHT  . KNEE ARTHROSCOPY  1978   RIGHT  . LEFT SHOULDER SURGERY   2002  . LUMBAR FUSION  09-10-2008   L4 - L5  . PERCUTANEOUS NEPHROSTOLITHOTOMY  1979  . PROSTATE BIOPSY  10/16/2012   Procedure: PROSTATE BIOPSY;  Surgeon: Malka So, MD;  Location: [facility];  Service: Urology;  Laterality: N/A;  Through ultrasound.  Marland Kitchen  RIGHT/LEFT HEART CATH AND CORONARY/GRAFT ANGIOGRAPHY N/A 10/26/2018   Procedure: RIGHT/LEFT HEART CATH AND CORONARY/GRAFT ANGIOGRAPHY;  Surgeon: Martinique, Peter M, MD;  Location: [facility];  Service: Cardiovascular;  Laterality: N/A;  . SHOULDER ARTHROSCOPY W/ SUBACROMIAL DECOMPRESSION AND DISTAL CLAVICLE EXCISION  10-11-2004   AND PARTIAL ROTATOR CUFF REPAIR  . TRANSTHORACIC ECHOCARDIOGRAM  89/21/1941   LV SYSTOLIC FUNCTION MILDLY REDUCED/ EF 48%/ LV FILLING PATTERN  IS IMPAIRED/ HYPOKINESIS IN THE MID AND BASALAR SEPTUM & ANTEROSEPTUM  . TURP VAPORIZATION  01-19-2010   BPH W/ BOO  .  URETEROLITHOTOMY  1976    Current Medications: Current Outpatient Medications on File Prior to Visit  Medication Sig  . amLODipine (NORVASC) 5 MG tablet Take 1 tablet (5 mg total) by mouth daily. (Patient taking differently: Take 5 mg by mouth daily at 12 noon. )  . aspirin 81 MG tablet Take 81 mg by mouth daily at 12 noon.   Marland Kitchen atorvastatin (LIPITOR) 80 MG tablet Take 1 tablet (80 mg total) by mouth daily. (Patient taking differently: Take 80 mg by mouth daily at 12 noon. )  . BD VEO INSULIN SYR ULTRAFINE 31G X 15/64" 1 ML MISC use as directed TO INJECT INSULIN  twice a day  . BRILINTA 90 MG TABS tablet Take 90 mg by mouth daily at 12 noon.   Marland Kitchen dexlansoprazole (DEXILANT) 60 MG capsule Take 1 capsule (60 mg total) by mouth daily before breakfast. (Patient taking differently: Take 60 mg by mouth daily at 12 noon. 12 noon)  . finasteride (PROSCAR) 5 MG tablet Take 1 tablet (5 mg total) by mouth daily. (Patient taking differently: Take 5 mg by mouth daily at 12 noon. 12 noon)  . furosemide (LASIX) 80 MG tablet Take 1 tablet (80 mg total) by mouth daily.  Marland Kitchen gabapentin (NEURONTIN) 100 MG capsule Take 2 capsules (200 mg total) by mouth at bedtime. (Patient taking differently: Take 100 mg by mouth daily at 12 noon. 12 noon)  . insulin NPH-regular Human (HUMULIN 70/30) (70-30) 100 UNIT/ML injection 90 units with breakfast, and 55 units with supper (Patient taking differently: Inject 40-70 Units into the skin See admin instructions. 70 mg in the morning and 40 mg in the evening)  . metFORMIN (GLUCOPHAGE) 1000 MG tablet Take 1 tablet (1,000 mg total) by mouth 2 (two) times daily with a meal. (Patient taking differently: Take 1,000 mg by mouth daily at 12 noon. 12 noon)  . metoprolol succinate (TOPROL-XL) 50 MG 24 hr tablet Take 1 tablet (50 mg total) by mouth daily. Take with or immediately following a meal.  . nitroGLYCERIN (NITROSTAT) 0.4 MG SL tablet Place 0.4 mg under the tongue as needed.  . potassium  chloride SA (K-DUR,KLOR-CON) 20 MEQ tablet Take 1 tablet (20 mEq total) by mouth 2 (two) times daily.  . sildenafil (REVATIO) 20 MG tablet TAKE 1 TABLET BY MOUTH THREE TIMES DAILY (Patient taking differently: Take 20 mg by mouth as needed. )  . spironolactone (ALDACTONE) 25 MG tablet Take 0.5 tablets (12.5 mg total) by mouth daily.  . sucralfate (CARAFATE) 1 g tablet TAKE 1 TABLET(1 GRAM) BY MOUTH FOUR TIMES DAILY AT BEDTIME WITH MEALS (Patient taking differently: Take 1 g by mouth 3 (three) times daily. Last dose at bedtime)   No current facility-administered medications on file prior to visit.      Allergies:   Patient has no known allergies.   Social History   Socioeconomic History  . Marital status: Married  Spouse name: Not on file  . Number of children: 4  . Years of education: Not on file  . Highest education level: Not on file  Occupational History  . Not on file  Social Needs  . Financial resource strain: Not on file  . Food insecurity:    Worry: Not on file    Inability: Not on file  . Transportation needs:    Medical: Not on file    Non-medical: Not on file  Tobacco Use  . Smoking status: Never Smoker  . Smokeless tobacco: Never Used  Substance and Sexual Activity  . Alcohol use: No  . Drug use: No  . Sexual activity: Not on file  Lifestyle  . Physical activity:    Days per week: Not on file    Minutes per session: Not on file  . Stress: Not on file  Relationships  . Social connections:    Talks on phone: Not on file    Gets together: Not on file    Attends religious service: Not on file    Active member of club or organization: Not on file    Attends meetings of clubs or organizations: Not on file    Relationship status: Not on file  Other Topics Concern  . Not on file  Social History Narrative   Lives in [place], [place], has family and friends in [place].     Family History: The patient's family history includes Diabetes in his father.  ROS:     Please see the history of present illness.  Additional pertinent ROS:  Constitutional: Negative for chills, fever, night sweats, unintentional weight loss  HENT: Negative for ear pain and hearing loss.   Eyes: Negative for loss of vision and eye pain.  Respiratory: Negative for cough, sputum, shortness of breath, wheezing.   Cardiovascular: See HPI. Gastrointestinal: Negative for abdominal pain, melena, and hematochezia. Positive for GERD and gassiness Genitourinary: Negative for dysuria and hematuria.  Musculoskeletal: Negative for falls and myalgias.  Skin: Negative for itching and rash.  Neurological: Negative for focal weakness, focal sensory changes and loss of consciousness.  Endo/Heme/Allergies: Does not bruise/bleed easily.    EKGs/Labs/Other Studies Reviewed:    The following studies were reviewed today: Echo 10/26/18 1. The left ventricle has mild-moderately reduced systolic function, with an ejection fraction of 40-45%. The cavity size was severely dilated. Left ventricular diastolic Doppler parameters are consistent with pseudonormalization Elevated left ventricular end-diastolic pressure.  2. Poor images preclude accurate assessment of wall motion even with definity.  3. The right ventricle has normal systolic function. The cavity was normal. There is no increase in right ventricular wall thickness.  4. Left atrial size was moderately dilated.  5. The mitral valve is normal in structure. Mitral valve regurgitation is moderate by color flow Doppler.  6. The tricuspid valve is normal in structure.  7. The aortic valve is tricuspid.  8. The pulmonic valve was normal in structure.  9. There is mild dilatation of the ascending aorta measuring 38 mm. 10. Right atrial pressure is estimated at 3 mmHg.  R/LHC 10/26/18  Mid LM to Dist LM lesion is 95% stenosed.  Prox LAD lesion is 100% stenosed.  Prox Cx lesion is 100% stenosed.  Prox RCA to Dist RCA lesion is 100%  stenosed.  LV end diastolic pressure is normal.  Hemodynamic findings consistent with mild pulmonary hypertension.   1. Occlusive native vessel CAD.     -95% distal left main    -  100% proximal LAD after the first septal perforator    - 100% LCx after a very small OM1.    - 100% proximal RCA 2. Patent LIMA to the LAD 3. Patent free radial graft arising from the mid LIMA graft and supplying the first diagonal and OM 4. SVG to RCA is known to be occluded. The distal RCA is supplied by collaterals.  5. Normal LV filling pressures 6. Mild pulmonary HTN. 7. Preserved cardiac output.  Plan: continue medical therapy for CHF. Will switch IV lasix to po. Stop IV Ntg and heparin.   EKG:  EKG is personally reviewed.  The ekg ordered today demonstrates NSR, iLBBB. Inferior/lateral ST changes unchanged from prior.  Recent Labs: 01/04/2018: Pro B Natriuretic peptide (BNP) 398.0 10/25/2018: B Natriuretic Peptide 598.0 10/27/2018: ALT 15; BUN 18; Creatinine, Ser 1.56; Hemoglobin 14.0; Magnesium 1.8; Platelets 194; Potassium 4.2; Sodium 139  Recent Lipid Panel    Component Value Date/Time   CHOL 227 (H) 10/22/2018 1149   TRIG 192.0 (H) 10/22/2018 1149   HDL 38.20 (L) 10/22/2018 1149   CHOLHDL 6 10/22/2018 1149   VLDL 38.4 10/22/2018 1149   LDLCALC 150 (H) 10/22/2018 1149   LDLDIRECT 155.0 07/13/2017 1033    Physical Exam:    VS:  BP 128/62 (BP Location: Left Arm, Patient Position: Sitting, Cuff Size: Normal)   Pulse 70   Ht 6' (1.829 m)   Wt 250 lb (113.4 kg)   BMI 33.91 kg/m     Wt Readings from Last 3 Encounters:  11/05/18 250 lb (113.4 kg)  10/27/18 142 lb 8 oz (64.6 kg)  10/24/18 260 lb (117.9 kg)     GEN: Well nourished, well developed in no acute distress HEENT: Normal NECK: No JVD; No carotid bruits LYMPHATICS: No lymphadenopathy CARDIAC: regular rhythm, normal S1 and S2, no murmurs, rubs, gallops. Radial and DP pulses 2+ bilaterally. RESPIRATORY:  Clear to auscultation  without rales, wheezing or rhonchi  ABDOMEN: Soft, non-tender, non-distended MUSCULOSKELETAL:  No edema; No deformity  SKIN: Warm and dry NEUROLOGIC:  Alert and oriented x 3 PSYCHIATRIC:  Normal affect   ASSESSMENT:    1. Coronary artery disease due to calcified coronary lesion   2. Chronic coronary artery disease   3. Hx of CABG   4. Chronic combined systolic and diastolic heart failure ([place])   5. Ischemic cardiomyopathy   6. Encounter for education about heart failure   7. Mixed hyperlipidemia   8. Medication management   9. Type 2 diabetes mellitus with stage 3 chronic kidney disease, with long-term current use of insulin (HCC)    PLAN:    CAD s/p CABG 2006: known SVG-PDA is occluded, other grafts are patent.  -on atorvastatin 80 mg, with LDL goal <70. However, LDL 150 on max statin dose. Even with change to rosuvastatin and adding ezetimibe, we could not get his cholesterol to goal. He would be a candidate for PCSK9 inhibitors, though cost is an issue for him. Will continue to discuss this -TG also elevated, consistent with mixed hyperlipidemia. Most recent 192. Would be candidate for vascepa, but again cost is an issue -on aspirin 81 mg -on ticagrelor 90 mg BID until at least 10/2019 given NSTEMI -on metoprolol succinate 50 mg daily -has sublingual nitroglycerin ordered. Also on sildenafil (ordered TID, taking as needed). Counseled on life threatening hypotension with nitroglycerin and sildenafil  Chronic systolic and diastolic heart failure: difficult echo windows, but known to be 40-45% with grade 2 diastolic dysfunction; due  to ischemic cardiomyopathy -would like to transition him to max guideline directed medical therapy, but he has Medicare, and cost is an issue for him.  -will start with change from lisinopril to entresto -continue metoprolol succinate 50 mg daily -continue spironolactone 12.5 mg daily -continue lasix 80 mg and potassium when he takes lasix. -educated on  heart failure, including daily weights, sodium and fluid guidelines, signs/symptoms to watch for, diet and activity recommendations  The patient has a history of heart failure with reduced ejection fraction with an EF ~40%, New York Heart Association (NYHA) Class II.  He will be started on Entresto (Sacubitril/Valsartan) 49/51 mg twice daily.  The patient's ACE inhibitor will be discontinued.  The first dose of Entresto will be taken 36 hours after the last dose of the ACE inhibitor.  Type II diabetes, on insulin and metformin, with chronic kidney disease stage 3 -trulicity was too expensive for him -would like to use jardiance given CKD, obesity, and CAD, though concerned about cost  Plan for follow up: 3 mos or sooner PRN  Medication Adjustments/Labs and Tests Ordered: Current medicines are reviewed at length with the patient today.  Concerns regarding medicines are outlined above.  Orders Placed This Encounter  Procedures  . EKG 12-Lead   Meds ordered this encounter  Medications  . sacubitril-valsartan (ENTRESTO) 49-51 MG    Sig: Take 1 tablet by mouth 2 (two) times daily.    Dispense:  60 tablet    Refill:  11    Patient Instructions  Medication Instructions:  Stop: Lisinopril Start: Entresto 49-51 mg on 11/06/18  If you need a refill on your cardiac medications before your next appointment, please call your pharmacy.   Lab work: None  Testing/Procedures: None  Follow-Up: At [facility], you and your health needs are our priority.  As part of our continuing mission to provide you with exceptional heart care, we have created designated Provider Care Teams.  These Care Teams include your primary Cardiologist (physician) and Advanced Practice Providers (APPs -  Physician Assistants and Nurse Practitioners) who all work together to provide you with the care you need, when you need it. You will need a follow up appointment in 3 months.  Please call our office 2 months in  advance to schedule this appointment.  You may see Dr. Harrell Gave or one of the following Advanced Practice Providers on your designated Care Team:   Rosaria Ferries, PA-C . Jory Sims, DNP, ANP  Your physician recommends that you schedule a follow-up appointment in 3-4 weeks with Pharm D.       Signed, Buford Dresser, MD PhD 11/05/2018 6:06 PM    [place]

## 2018-11-05 NOTE — Patient Instructions (Addendum)
Medication Instructions:  Stop: Lisinopril Start: Entresto 49-51 mg on 11/06/18  If you need a refill on your cardiac medications before your next appointment, please call your pharmacy.   Lab work: None  Testing/Procedures: None  Follow-Up: At [facility], you and your health needs are our priority.  As part of our continuing mission to provide you with exceptional heart care, we have created designated Provider Care Teams.  These Care Teams include your primary Cardiologist (physician) and Advanced Practice Providers (APPs -  Physician Assistants and Nurse Practitioners) who all work together to provide you with the care you need, when you need it. You will need a follow up appointment in 3 months.  Please call our office 2 months in advance to schedule this appointment.  You may see Dr. Harrell Gave or one of the following Advanced Practice Providers on your designated Care Team:   Rosaria Ferries, PA-C . Jory Sims, DNP, ANP  Your physician recommends that you schedule a follow-up appointment in 3-4 weeks with Pharm D.

## 2018-11-06 ENCOUNTER — Telehealth: Payer: Self-pay | Admitting: Emergency Medicine

## 2018-11-06 NOTE — Telephone Encounter (Signed)
LVM for pt to call back and reschedule appt. I do not see where there is an opening Thursday morning, but there is a 30 minute slot on Friday. Are you okay with having 3 Hos follow-ups Friday? Please send back to Tammy.

## 2018-11-06 NOTE — Telephone Encounter (Signed)
Yes, ok to reschedule for Friday.Marland KitchenMarland KitchenMarland Kitchen

## 2018-11-06 NOTE — Telephone Encounter (Signed)
Patient has been scheduled. 2/28 at 10:30am

## 2018-11-06 NOTE — Telephone Encounter (Signed)
Copied from [place] [id number]. Topic: Appointment Scheduling - Scheduling Inquiry for Clinic >> Nov 06, 2018  1:00 PM Margot Ables wrote: Reason for CRM: pt called stating he needs different appt time 11/08/2018. He has appt to meet with cancer doctor with his fiance 2/27 at 11:00am. Pt lives in [place] and is in [place] only thru this Saturday. Pt asking for earlier morning appt 2/27 if possible or maybe appt on Friday. Please advise.

## 2018-11-08 ENCOUNTER — Inpatient Hospital Stay: Payer: Medicare Other | Admitting: Internal Medicine

## 2018-11-08 NOTE — Progress Notes (Signed)
Subjective:    Patient ID: Russell Trevino, male    DOB: [birth year], 68 y.o.   MRN: [id number]  HPI The patient is here for follow up from the hospital.   Admitted 10/25/2018-10/29/2018  Recommendations for outpatient follow-up: 1.  Follow-up with PCP 2.  Follow-up with cardiology-10/29/2018 3   CMP, CBC, mag, phosphorus - will hold off since recent medication change   He was advised to go to the emergency room by GI for chest pain.  He was experiencing heartburn that was constant and associated with increased gas.  The day before going to the emergency room he was experiencing shortness of breath that was worsening with exertion.  He was barely able to walk up 3-4 steps.  He did not have any chest pain in the emergency room.  Shortness of breath improved with Nitropaste.  He denied any nausea and vomiting.  He did have some abdominal soreness, but no diarrhea.  In the ED his BNP was 598, troponin 1.3.  He was started on IV heparin and cardiology was consulted.  His EKG showed normal sinus rhythm with right axis deviation, T wave inversion in the inferior leads.  He was diagnosed with a NSTEMI.  He underwent a cardiac catheterization 2/17 that showed mid LM to distal LM lesion 95% stenosed, proximal LAD lesion 100% stenosed, proximal circumflex lesion 100% stenosed and proximal RCA to distal RCA lesion 100% stenosed.  Cardiology recommended medical therapy for CHF and switch IV Lasix to p.o. and start IV nitroglycerin and IV heparin.  Cardiology recommended switching to [facility].  No intervention with catheterization.  Medical management only.  He improved with diuresis.  NSTEMI: Cardiology consulted Nitropaste, aspirin and Nitropaste in the emergency room Troponin peaked at 1.36 and then trended down Echocardiogram-EF 40-45%, LV severely dilated, LV diastolic dysfunction Cardiac catheterization without intervention Continue Brilinta Continued on atorvastatin 80 mg daily Furosemide 80 mg  twice daily Lisinopril 40 mg daily Metoprolol succinate 50 mg daily Follow-up with cardiology as an outpatient  Hyperlipidemia: LDL 150 Continued Lipitor 80 mg daily Cardiology did discuss with him PC SK 9 inhibitor-we will discuss more as an outpatient  Hypertension: Continue Norvasc, lisinopril and Toprol, Lasix 80 mg twice daily  Chronic systolic heart failure: EF 45-50% in April 2019 BNP 598 on admission Diuresed Lasix continued-80 mg twice daily Follow-up in cardiology clinic  Diabetes, type II, uncontrolled: Metformin held A1c 9.9 on 2/10 Continue Lantus, sliding scale NovoLog Consider starting Jardiance  Normocytic anemia Hemoglobin 11.5-14 Check anemia panel as an outpatient Monitor  Renal insufficiency/chronic kidney disease stage III: Creatinine increased just prior to discharge Continue to monitor and avoid nephrotoxic medications Lisinopril continued He did receive contrast dye for his cardiac catheterization  Obesity: Weight loss counseling given    GERD:  He still has some GERD, but it is better.  He is taking his medication daily as prescribed.  He is working on weight loss.  He has been much more compliant with a GERD diet.  Lightheadedness:  He gets very lghtheaded when he bends over.  He has been walking every morning and got very lightheaded.  It started a couple of days ago.  Once he gets lightheaded it stays with him.  A couple of days he stopped the lisinopril and started entresto.   He thinks the lightheadedness is likely related to the medication change.  Diabetes: He is taking his medication daily as prescribed.  His sugars have been around 120-much better controlled.  He has been much more compliant with a diabetic diet.  He has been trying to exercise, but the past couple of days with the lightheadedness he has not been able to.  He has lost weight and is eager to continue his weight loss efforts.  Hypertension, CAD, recent NSTEMI,  cardiomyopathy: He has seen cardiology.  He is taking all of his medications as prescribed.  He is very compliant with low-sodium diet and is eating much better.  He denies any chest pain, palpitations and leg swelling.  He is not having headaches.  Hyperlipidemia: He is taking his statin daily as prescribed.    Medications and allergies reviewed with patient and updated if appropriate.  Patient Active Problem List   Diagnosis Date Noted  . Ischemic cardiomyopathy 11/05/2018  . Chronic combined systolic and diastolic heart failure ([place]) 11/05/2018  . SOB (shortness of breath) 10/22/2018  . Type 2 diabetes mellitus with stage 3 chronic kidney disease, with long-term current use of insulin ([place]) 08/26/2017  . GERD (gastroesophageal reflux disease) 07/13/2017  . Cough 08/17/2016  . Thoracic back pain 06/21/2016  . Hx of CABG 05/26/2016  . Prostate cancer ([place]) 05/26/2016  . Essential hypertension, benign 05/26/2016  . Mixed hyperlipidemia 05/26/2016  . Cardiomyopathy ([place]) 03/25/2013  . Coronary artery disease due to calcified coronary lesion 03/25/2013    Current Outpatient Medications on File Prior to Visit  Medication Sig Dispense Refill  . amLODipine (NORVASC) 5 MG tablet Take 1 tablet (5 mg total) by mouth daily. (Patient taking differently: Take 5 mg by mouth daily at 12 noon. ) 90 tablet 1  . aspirin 81 MG tablet Take 81 mg by mouth daily at 12 noon.     Marland Kitchen atorvastatin (LIPITOR) 80 MG tablet Take 1 tablet (80 mg total) by mouth daily. (Patient taking differently: Take 80 mg by mouth daily at 12 noon. ) 90 tablet 1  . BD VEO INSULIN SYR ULTRAFINE 31G X 15/64" 1 ML MISC use as directed TO INJECT INSULIN  twice a day 10 each 0  . BRILINTA 90 MG TABS tablet Take 90 mg by mouth daily at 12 noon.   10  . dexlansoprazole (DEXILANT) 60 MG capsule Take 1 capsule (60 mg total) by mouth daily before breakfast. (Patient taking differently: Take 60 mg by mouth daily at 12 noon. 12 noon) 30  capsule 0  . finasteride (PROSCAR) 5 MG tablet Take 1 tablet (5 mg total) by mouth daily. (Patient taking differently: Take 5 mg by mouth daily at 12 noon. 12 noon) 90 tablet 1  . furosemide (LASIX) 80 MG tablet Take 1 tablet (80 mg total) by mouth daily. 30 tablet 0  . gabapentin (NEURONTIN) 100 MG capsule Take 2 capsules (200 mg total) by mouth at bedtime. (Patient taking differently: Take 100 mg by mouth daily at 12 noon. 12 noon) 60 capsule 1  . insulin NPH-regular Human (HUMULIN 70/30) (70-30) 100 UNIT/ML injection 90 units with breakfast, and 55 units with supper (Patient taking differently: Inject 40-70 Units into the skin See admin instructions. 70 mg in the morning and 40 mg in the evening) 50 mL 3  . metFORMIN (GLUCOPHAGE) 1000 MG tablet Take 1 tablet (1,000 mg total) by mouth 2 (two) times daily with a meal. (Patient taking differently: Take 1,000 mg by mouth daily at 12 noon. 12 noon) 180 tablet 1  . metoprolol succinate (TOPROL-XL) 50 MG 24 hr tablet Take 1 tablet (50 mg total) by mouth daily. Take with or  immediately following a meal. 30 tablet 0  . nitroGLYCERIN (NITROSTAT) 0.4 MG SL tablet Place 0.4 mg under the tongue as needed.    . potassium chloride SA (K-DUR,KLOR-CON) 20 MEQ tablet Take 1 tablet (20 mEq total) by mouth 2 (two) times daily. 60 tablet 0  . sacubitril-valsartan (ENTRESTO) 49-51 MG Take 1 tablet by mouth 2 (two) times daily. 60 tablet 11  . sildenafil (REVATIO) 20 MG tablet TAKE 1 TABLET BY MOUTH THREE TIMES DAILY (Patient taking differently: Take 20 mg by mouth as needed. ) 90 tablet 2  . spironolactone (ALDACTONE) 25 MG tablet Take 0.5 tablets (12.5 mg total) by mouth daily. 30 tablet 0  . sucralfate (CARAFATE) 1 g tablet TAKE 1 TABLET(1 GRAM) BY MOUTH FOUR TIMES DAILY AT BEDTIME WITH MEALS (Patient taking differently: Take 1 g by mouth 3 (three) times daily. Last dose at bedtime) 360 tablet 1   No current facility-administered medications on file prior to visit.      Past Medical History:  Diagnosis Date  . BPH (benign prostatic hypertrophy)   . Cardiomyopathy, ischemic   . Coronary artery disease    at [facility], [place] 2009 (GRAFTS PATENT), cath 05/2018 w/ SVG-PDA 100%>>med rx  . Diabetes mellitus, type 2 ([place])   . Frequency of urination   . Gross hematuria   . History of CHF (congestive heart failure) 2007  . History of kidney stones   . History of myocardial infarction 2006, 2019   2006>>CABG, 2019 cath w/ med rx  . History of urinary retention   . Hyperlipidemia   . Nocturia   . Renal calculi    BILATERAL PER CT SCAN--  NON-OBSTRUCTIVE  . S/P CABG x 4 2006    Past Surgical History:  Procedure Laterality Date  . CARDIAC CATHETERIZATION  01/17/2008   GRAFTS PATENT / EF 40%,  DR SANTOS ([facility])  . CORONARY ARTERY BYPASS GRAFT  04/2005   LIMA-LAD, L radial-OM-D1 and SVG-PDA  . CYSTO / RIGHT URETERAL STENT PLACEMENT  10-08-1999  . CYSTO/  REMOVAL BLADDER STONES  01-14-2010  . CYSTOSCOPY  10/16/2012   Procedure: CYSTOSCOPY;  Surgeon: Malka So, MD;  Location: [facility];  Service: Urology;  Laterality: N/A;  Cystoscopy with Clot Evacuation and fulguration bladder neck.  Marland Kitchen EXTRACORPOREAL SHOCK WAVE LITHOTRIPSY  10-15-2009   RIGHT  . KNEE ARTHROSCOPY  1978   RIGHT  . LEFT SHOULDER SURGERY   2002  . LUMBAR FUSION  09-10-2008   L4 - L5  . PERCUTANEOUS NEPHROSTOLITHOTOMY  1979  . PROSTATE BIOPSY  10/16/2012   Procedure: PROSTATE BIOPSY;  Surgeon: Malka So, MD;  Location: [facility];  Service: Urology;  Laterality: N/A;  Through ultrasound.  Marland Kitchen RIGHT/LEFT HEART CATH AND CORONARY/GRAFT ANGIOGRAPHY N/A 10/26/2018   Procedure: RIGHT/LEFT HEART CATH AND CORONARY/GRAFT ANGIOGRAPHY;  Surgeon: Martinique, Peter M, MD;  Location: [place] [facility];  Service: Cardiovascular;  Laterality: N/A;  . SHOULDER ARTHROSCOPY W/ SUBACROMIAL DECOMPRESSION AND DISTAL CLAVICLE EXCISION  10-11-2004   AND PARTIAL ROTATOR CUFF REPAIR   . TRANSTHORACIC ECHOCARDIOGRAM  34/74/2595   LV SYSTOLIC FUNCTION MILDLY REDUCED/ EF 48%/ LV FILLING PATTERN  IS IMPAIRED/ HYPOKINESIS IN THE MID AND BASALAR SEPTUM & ANTEROSEPTUM  . TURP VAPORIZATION  01-19-2010   BPH W/ BOO  . URETEROLITHOTOMY  1976    Social History   Socioeconomic History  . Marital status: Married    Spouse name: Not on file  . Number of children: 4  .  Years of education: Not on file  . Highest education level: Not on file  Occupational History  . Not on file  Social Needs  . Financial resource strain: Not on file  . Food insecurity:    Worry: Not on file    Inability: Not on file  . Transportation needs:    Medical: Not on file    Non-medical: Not on file  Tobacco Use  . Smoking status: Never Smoker  . Smokeless tobacco: Never Used  Substance and Sexual Activity  . Alcohol use: No  . Drug use: No  . Sexual activity: Not on file  Lifestyle  . Physical activity:    Days per week: Not on file    Minutes per session: Not on file  . Stress: Not on file  Relationships  . Social connections:    Talks on phone: Not on file    Gets together: Not on file    Attends religious service: Not on file    Active member of club or organization: Not on file    Attends meetings of clubs or organizations: Not on file    Relationship status: Not on file  Other Topics Concern  . Not on file  Social History Narrative   Lives in [place], [place], has family and friends in [place].    Family History  Problem Relation Age of Onset  . Diabetes Father     Review of Systems  Constitutional: Negative for appetite change, chills and fever.  Respiratory: Positive for cough (dry). Negative for shortness of breath and wheezing.   Cardiovascular: Negative for chest pain, palpitations and leg swelling.  Gastrointestinal: Negative for abdominal pain and nausea.       Burping, GERD  Neurological: Positive for light-headedness. Negative for headaches.       Objective:    Vitals:   11/09/18 1032  BP: 122/60  Pulse: 65  Resp: 16  Temp: 98.6 F (37 C)  SpO2: 98%   BP Readings from Last 3 Encounters:  11/09/18 122/60  11/05/18 128/62  10/27/18 117/90   Wt Readings from Last 3 Encounters:  11/09/18 248 lb 12.8 oz (112.9 kg)  11/05/18 250 lb (113.4 kg)  10/27/18 142 lb 8 oz (64.6 kg)   Body mass index is 33.74 kg/m.   Physical Exam    Constitutional: Appears well-developed and well-nourished. No distress.  HENT:  Head: Normocephalic and atraumatic.  Neck: Neck supple. No tracheal deviation present. No thyromegaly present.  No cervical lymphadenopathy Cardiovascular: Normal rate, regular rhythm and normal heart sounds.   No murmur heard. No carotid bruit .  No edema Pulmonary/Chest: Effort normal and breath sounds normal. No respiratory distress. No has no wheezes. No rales.  Skin: Skin is warm and dry. Not diaphoretic.  Psychiatric: Normal mood and affect. Behavior is normal.   Lab Results  Component Value Date   WBC 7.9 10/27/2018   HGB 14.0 10/27/2018   HCT 43.3 10/27/2018   PLT 194 10/27/2018   GLUCOSE 170 (H) 10/27/2018   CHOL 227 (H) 10/22/2018   TRIG 192.0 (H) 10/22/2018   HDL 38.20 (L) 10/22/2018   LDLDIRECT 155.0 07/13/2017   LDLCALC 150 (H) 10/22/2018   ALT 15 10/27/2018   AST 17 10/27/2018   NA 139 10/27/2018   K 4.2 10/27/2018   CL 103 10/27/2018   CREATININE 1.56 (H) 10/27/2018   BUN 18 10/27/2018   CO2 27 10/27/2018   TSH 1.86 07/13/2017   PSA 4.43 (H) 05/26/2016  INR 1.06 10/07/2009   HGBA1C 9.9 (H) 10/22/2018   MICROALBUR 266.9 (H) 05/26/2016    ECHOCARDIOGRAM COMPLETE   ECHOCARDIOGRAM REPORT       Patient Name:   Russell Trevino Date of Exam: 10/26/2018 Medical Rec #:  [id number]       Height:       72.0 in Accession #:    [id number]      Weight:       250.2 lb Date of Birth:  1951/05/24      BSA:          2.34 m Patient Age:    78 years        BP:           132/73 mmHg Patient Gender: M                HR:           71 bpm. Exam Location:  Inpatient    Procedure: 2D Echo and Intracardiac Opacification Agent  Indications:    Chest Pain 786.50 / R07.9   History:        Patient has no prior history of Echocardiogram examinations.                 CHF, CAD, Prior CABG; Risk Factors: Hypertension, Diabetes and                 Dyslipidemia.   Sonographer:    Darlina Sicilian RDCS Referring Phys: [place]   1. The left ventricle has mild-moderately reduced systolic function, with an ejection fraction of 40-45%. The cavity size was severely dilated. Left ventricular diastolic Doppler parameters are consistent with pseudonormalization Elevated left  ventricular end-diastolic pressure.  2. Poor images preclude accurate assessment of wall motion even with definity.  3. The right ventricle has normal systolic function. The cavity was normal. There is no increase in right ventricular wall thickness.  4. Left atrial size was moderately dilated.  5. The mitral valve is normal in structure. Mitral valve regurgitation is moderate by color flow Doppler.  6. The tricuspid valve is normal in structure.  7. The aortic valve is tricuspid.  8. The pulmonic valve was normal in structure.  9. There is mild dilatation of the ascending aorta measuring 38 mm. 10. Right atrial pressure is estimated at 3 mmHg.  FINDINGS  Left Ventricle: The left ventricle has mild-moderately reduced systolic function, with an ejection fraction of 40-45%. The cavity size was severely dilated. There is no increase in left ventricular wall thickness. Left ventricular diastolic Doppler  parameters are consistent with pseudonormalization Elevated left ventricular end-diastolic pressure Definity contrast agent was given IV to delineate the left ventricular endocardial borders. Poor images preclude accurate assessment of wall motion even  with definity. Right Ventricle: The right ventricle has normal systolic  function. The cavity was normal. There is no increase in right ventricular wall thickness. Left Atrium: left atrial size was moderately dilated Right Atrium: right atrial size was normal in size Right atrial pressure is estimated at 3 mmHg. Interatrial Septum: No atrial level shunt detected by color flow Doppler. Pericardium: There is no evidence of pericardial effusion. Mitral Valve: The mitral valve is normal in structure. Mitral valve regurgitation is moderate by color flow Doppler. Tricuspid Valve: The tricuspid valve is normal in structure. Tricuspid valve regurgitation is mild by color flow Doppler. Aortic Valve: The aortic valve is tricuspid Aortic valve regurgitation was not visualized by color  flow Doppler. Pulmonic Valve: The pulmonic valve was normal in structure. Pulmonic valve regurgitation is trivial by color flow Doppler. Aorta: There is mild dilatation of the ascending aorta measuring 38 mm. Venous: The inferior vena cava is normal in size with greater than 50% respiratory variability.   LEFT VENTRICLE PLAX 2D ([place])              Biplane EF (MOD) LV EF:          35.8 %       LV Biplane EF:   48.1 % LVIDd:          6.42 cm      LV A4C EF:       55.1 % LVIDs:          5.29 cm      LV A2C EF:       40.7 % LV PW:          1.04 cm LV IVS:         0.82 cm      Diastology LVOT diam:      1.90 cm      LV e' lateral:   6.06 cm/s LV SV:          75 ml        LV E/e' lateral: 19.3 LVOT Area:      2.84 cm     LV e' medial:    3.26 cm/s                              LV E/e' medial:  35.9 LV Volumes (MOD) LV area d, A2C:    45.60 cm LV area d, A4C:    44.50 cm LV area s, A2C:    29.80 cm LV area s, A4C:    26.40 cm LV major d, A2C:   9.08 cm LV major d, A4C:   9.72 cm LV major s, A2C:   7.05 cm LV major s, A4C:   7.67 cm LV vol d, MOD A2C: 194.0 ml LV vol d, MOD A4C: 174.0 ml LV vol s, MOD A2C: 115.0 ml LV vol s, MOD A4C: 78.1 ml LV SV MOD A2C:     79.0 ml LV SV MOD A4C:      174.0 ml LV SV MOD BP:      91.4 ml  RIGHT VENTRICLE RVSP:           33.2 mmHg  LEFT ATRIUM             Index       RIGHT ATRIUM           Index LA diam:        4.70 cm 2.01 cm/m  RA Pressure: 3 mmHg LA Vol (A2C):   48.1 ml 20.53 ml/m RA Area:     15.60 cm LA Vol (A4C):   70.8 ml 30.21 ml/m RA Volume:   45.00 ml  19.20 ml/m LA Biplane Vol: 64.0 ml 27.31 ml/m  AORTIC VALVE LVOT Vmax:   79.10 cm/s LVOT Vmean:  58.900 cm/s LVOT VTI:    0.157 m   AORTA Ao Root diam: 3.20 cm Ao Asc diam:  3.80 cm  MITRAL VALVE               TRICUSPID VALVE MV Area (PHT): 5.84 cm    TR Peak grad:   30.2 mmHg MV PHT:  37.70 msec  TR Vmax:        275.00 cm/s MV Decel Time: 130 msec    RVSP:           33.2 mmHg MR Peak grad: 111.5 mmHg MR Mean grad: 74.0 mmHg MR Vmax:      528.00 cm/s MR Vmean:     411.0 cm/s MV E velocity: 117.00 cm/s MV A velocity: 56.30 cm/s MV E/A ratio:  2.08    Fransico Him MD Electronically signed by Fransico Him MD Signature Date/Time: 10/26/2018/7:12:34 PM      Final   CARDIAC CATHETERIZATION  Mid LM to Dist LM lesion is 95% stenosed.  Prox LAD lesion is 100% stenosed.  Prox Cx lesion is 100% stenosed.  Prox RCA to Dist RCA lesion is 100% stenosed.  LV end diastolic pressure is normal.  Hemodynamic findings consistent with mild pulmonary hypertension.   1. Occlusive native vessel CAD.     -95% distal left main    - 100% proximal LAD after the first septal perforator    - 100% LCx after a very small OM1.    - 100% proximal RCA 2. Patent LIMA to the LAD 3. Patent free radial graft arising from the mid LIMA graft and supplying  the first diagonal and OM 4. SVG to RCA is known to be occluded. The distal RCA is supplied by  collaterals.  5. Normal LV filling pressures 6. Mild pulmonary HTN. 7. Preserved cardiac output.  Plan: continue medical therapy for CHF. Will switch IV lasix to po. Stop  IV Ntg and heparin.     Assessment & Plan:      See Problem List for Assessment and Plan of chronic medical problems.

## 2018-11-08 NOTE — Patient Instructions (Addendum)
   Medications reviewed and updated.  Changes include :   none    Please followup in 3 months

## 2018-11-09 ENCOUNTER — Encounter: Payer: Self-pay | Admitting: Internal Medicine

## 2018-11-09 ENCOUNTER — Ambulatory Visit (INDEPENDENT_AMBULATORY_CARE_PROVIDER_SITE_OTHER): Payer: Medicare Other | Admitting: Internal Medicine

## 2018-11-09 VITALS — BP 122/60 | HR 65 | Temp 98.6°F | Resp 16 | Ht 72.0 in | Wt 248.8 lb

## 2018-11-09 DIAGNOSIS — N183 Chronic kidney disease, stage 3 unspecified: Secondary | ICD-10-CM

## 2018-11-09 DIAGNOSIS — E1122 Type 2 diabetes mellitus with diabetic chronic kidney disease: Secondary | ICD-10-CM

## 2018-11-09 DIAGNOSIS — I10 Essential (primary) hypertension: Secondary | ICD-10-CM | POA: Diagnosis not present

## 2018-11-09 DIAGNOSIS — K219 Gastro-esophageal reflux disease without esophagitis: Secondary | ICD-10-CM

## 2018-11-09 DIAGNOSIS — I5042 Chronic combined systolic (congestive) and diastolic (congestive) heart failure: Secondary | ICD-10-CM

## 2018-11-09 DIAGNOSIS — E782 Mixed hyperlipidemia: Secondary | ICD-10-CM

## 2018-11-09 DIAGNOSIS — Z794 Long term (current) use of insulin: Secondary | ICD-10-CM

## 2018-11-09 DIAGNOSIS — I2510 Atherosclerotic heart disease of native coronary artery without angina pectoris: Secondary | ICD-10-CM

## 2018-11-09 DIAGNOSIS — I2584 Coronary atherosclerosis due to calcified coronary lesion: Secondary | ICD-10-CM

## 2018-11-10 NOTE — Assessment & Plan Note (Addendum)
Lisinopril changed to [facility] Experiencing lightheadedness-we will discuss with cardiology Euvolemic on exam  Management per cardiology

## 2018-11-10 NOTE — Assessment & Plan Note (Signed)
Continue atorvastatin Cardiology may change him to PCSK9 inhibitor

## 2018-11-10 NOTE — Assessment & Plan Note (Signed)
BP well controlled Current regimen effective and well tolerated Continue current medications at current doses  

## 2018-11-10 NOTE — Assessment & Plan Note (Signed)
Sugars much better controlled at home and A1c improving Should be on Jardiance, but does not want to make an additional medication change at this time Continue insulin, metformin Working on weight loss Continue good compliance with diabetic diet

## 2018-11-10 NOTE — Assessment & Plan Note (Signed)
Improved, but still experiencing GERD Continue current medications Working on weight loss Continue GERD diet

## 2018-11-10 NOTE — Assessment & Plan Note (Signed)
Following with cardiology No chest pain, palpitations, shortness of breath or edema Management per cardiology

## 2018-11-13 ENCOUNTER — Encounter: Payer: Self-pay | Admitting: Internal Medicine

## 2018-11-29 ENCOUNTER — Telehealth: Payer: Self-pay | Admitting: Pharmacist Clinician (PhC)/ Clinical Pharmacy Specialist

## 2018-11-29 NOTE — Telephone Encounter (Signed)
For scheduling purposes:  CV[facility]-NL  Priority status 1 - 3/20

## 2018-11-29 NOTE — Telephone Encounter (Signed)
   Cardiac Questionnaire:    Since your last visit or hospitalization:    1. Have you been having new or worsening chest pain? No   2. Have you been having new or worsening shortness of breath? No 3. Have you been having new or worsening leg swelling, wt gain, or increase in abdominal girth (pants fitting more tightly)? No   4. Have you had any passing out spells? No    *A YES to any of these questions would result in the appointment being kept. *If all the answers to these questions are NO, we should indicate that given the current situation regarding the worldwide coronarvirus pandemic, at the recommendation of the CDC, we are looking to limit gatherings in our waiting area, and thus will reschedule their appointment beyond four weeks from today.   _____________  .       Primary Cardiologist: [place]   Patient contacted.  History reviewed.  No symptoms to suggest any unstable cardiac conditions.  Based on discussion, with current pandemic situation, we will be postponing this appointment for Russell Trevino  If symptoms change, he has been instructed to contact our office.  Routing to C19 CANCEL pool for tracking (P CV DIV CV19 CANCEL).  Tommy Medal, RPH-CPP  11/29/2018 12:30 PM         .

## 2018-11-29 NOTE — Telephone Encounter (Signed)
Patient has appointment scheduled with [facility] for Friday morning.   LMOM to have patient call back for screening

## 2018-11-29 NOTE — Telephone Encounter (Deleted)
Pt already has f/u appt out to 02/2019. Will remove from cancellation/rs pool. Kimyata Milich PA-C

## 2018-11-30 ENCOUNTER — Ambulatory Visit: Payer: Medicare Other

## 2018-12-18 ENCOUNTER — Telehealth: Payer: Self-pay | Admitting: Internal Medicine

## 2018-12-18 MED ORDER — SILDENAFIL CITRATE 20 MG PO TABS: 20.0000 mg | ORAL_TABLET | ORAL | 1 refills | Status: DC | PRN

## 2018-12-18 NOTE — Telephone Encounter (Signed)
Rx sent 

## 2018-12-18 NOTE — Telephone Encounter (Signed)
Copied from [place] [id number]. Topic: Quick Communication - Rx Refill/Question >> Dec 18, 2018 12:56 PM Percell Belt A wrote: Medication: sildenafil (REVATIO) 20 MG tablet [[id number]]   Has the patient contacted their pharmacy? No. (Agent: If no, request that the patient contact the pharmacy for the refill.) (Agent: If yes, when and what did the pharmacy advise?)  Preferred Pharmacy (with phone number or street name): [facility] in [place] ph#[phone]  Agent: Please be advised that RX refills may take up to 3 business days. We ask that you follow-up with your pharmacy.

## 2018-12-27 ENCOUNTER — Telehealth: Payer: Self-pay | Admitting: Pharmacist Clinician (PhC)/ Clinical Pharmacy Specialist

## 2018-12-28 NOTE — Telephone Encounter (Signed)
LMOM for pt to call regarding telephone appointment in CV[facility].,  Started Entresto in Feb, does need BMET

## 2019-01-08 ENCOUNTER — Ambulatory Visit (INDEPENDENT_AMBULATORY_CARE_PROVIDER_SITE_OTHER): Payer: Medicare Other | Admitting: Gastroenterology

## 2019-01-08 ENCOUNTER — Other Ambulatory Visit: Payer: Self-pay

## 2019-01-08 DIAGNOSIS — Z539 Procedure and treatment not carried out, unspecified reason: Secondary | ICD-10-CM

## 2019-01-08 NOTE — Progress Notes (Deleted)
This patient contacted our office requesting a physician telemedicine video consultation regarding clinical questions and/or test results. Interactive audio and video telecommunications were attempted between this provider and the patient.  However, this technology failed due to the patient having technical difficulties OR they did not have access to video capabilities.  We continued and completed the visit with audio only.  If new patient, they were referred by Billey Gosling, MD  Participants on the call : myself and patient   The patient consented to phone consultation and was aware that a charge will be placed through their insurance.  I was in my office and the patient was at home   Encounter time:  Total time *** minutes, with *** minutes spent with patient on phone/webex    Wilfrid Lund, MD   _____________________________________________________________________________________________           [facility] Follow up Note:  History: [name] 01/08/2019  Referring physician: Binnie Rail, MD  Reason for consult/chief complaint: No chief complaint on file. Chest pain  Subjective  HPI:  *** This patient was seen in office consultation with Nicoletta Ba, PA on 10/24/2018 for chest pain.  He had a history of coronary disease including prior CABG.  His history was most worrisome for escalating angina, and he was referred to the emergency department.  Dexilant and Carafate were prescribed at that time. Patient had elevated BNP as well as mild elevation of troponin.  Cardiac catheterization done on 10/26/2018 showed severe multivessel disease and bypass grafts, upon which no intervention could be taken.  Medical management was recommended with lipid-lowering agent, antiplatelet agents, beta-blocker, nitroglycerin and diuretics.  He was also switched from lisinopril to [facility].  Cardiology office follow-up on 11/05/2018 with Dr. Harrell Gave noted ischemic  cardiomyopathy with EF 40 to 45%, recent NSTEMI referring to the admission noted above.  ROS:  Review of Systems   Past Medical History: Past Medical History:  Diagnosis Date  . BPH (benign prostatic hypertrophy)   . Cardiomyopathy, ischemic   . Coronary artery disease    at [facility], [place] 2009 (GRAFTS PATENT), cath 05/2018 w/ SVG-PDA 100%>>med rx  . Diabetes mellitus, type 2 ([place])   . Frequency of urination   . Gross hematuria   . History of CHF (congestive heart failure) 2007  . History of kidney stones   . History of myocardial infarction 2006, 2019   2006>>CABG, 2019 cath w/ med rx  . History of urinary retention   . Hyperlipidemia   . Nocturia   . Renal calculi    BILATERAL PER CT SCAN--  NON-OBSTRUCTIVE  . S/P CABG x 4 2006     Past Surgical History: Past Surgical History:  Procedure Laterality Date  . CARDIAC CATHETERIZATION  01/17/2008   GRAFTS PATENT / EF 40%,  DR SANTOS ([facility])  . CORONARY ARTERY BYPASS GRAFT  04/2005   LIMA-LAD, L radial-OM-D1 and SVG-PDA  . CYSTO / RIGHT URETERAL STENT PLACEMENT  10-08-1999  . CYSTO/  REMOVAL BLADDER STONES  01-14-2010  . CYSTOSCOPY  10/16/2012   Procedure: CYSTOSCOPY;  Surgeon: Malka So, MD;  Location: [facility];  Service: Urology;  Laterality: N/A;  Cystoscopy with Clot Evacuation and fulguration bladder neck.  Marland Kitchen EXTRACORPOREAL SHOCK WAVE LITHOTRIPSY  10-15-2009   RIGHT  . KNEE ARTHROSCOPY  1978   RIGHT  . LEFT SHOULDER SURGERY   2002  . LUMBAR FUSION  09-10-2008   L4 - L5  . PERCUTANEOUS NEPHROSTOLITHOTOMY  1979  .  PROSTATE BIOPSY  10/16/2012   Procedure: PROSTATE BIOPSY;  Surgeon: Malka So, MD;  Location: [facility];  Service: Urology;  Laterality: N/A;  Through ultrasound.  Marland Kitchen RIGHT/LEFT HEART CATH AND CORONARY/GRAFT ANGIOGRAPHY N/A 10/26/2018   Procedure: RIGHT/LEFT HEART CATH AND CORONARY/GRAFT ANGIOGRAPHY;  Surgeon: Martinique, Peter M, MD;  Location: [facility];  Service:  Cardiovascular;  Laterality: N/A;  . SHOULDER ARTHROSCOPY W/ SUBACROMIAL DECOMPRESSION AND DISTAL CLAVICLE EXCISION  10-11-2004   AND PARTIAL ROTATOR CUFF REPAIR  . TRANSTHORACIC ECHOCARDIOGRAM  57/26/2035   LV SYSTOLIC FUNCTION MILDLY REDUCED/ EF 48%/ LV FILLING PATTERN  IS IMPAIRED/ HYPOKINESIS IN THE MID AND BASALAR SEPTUM & ANTEROSEPTUM  . TURP VAPORIZATION  01-19-2010   BPH W/ BOO  . URETEROLITHOTOMY  1976     Family History: Family History  Problem Relation Age of Onset  . Diabetes Father   . Stomach cancer Neg Hx   . Colon cancer Neg Hx   . Rectal cancer Neg Hx   . Esophageal cancer Neg Hx   . Pancreatic cancer Neg Hx     Social History: Social History   Socioeconomic History  . Marital status: Married    Spouse name: Not on file  . Number of children: 4  . Years of education: Not on file  . Highest education level: Not on file  Occupational History  . Not on file  Social Needs  . Financial resource strain: Not on file  . Food insecurity:    Worry: Not on file    Inability: Not on file  . Transportation needs:    Medical: Not on file    Non-medical: Not on file  Tobacco Use  . Smoking status: Never Smoker  . Smokeless tobacco: Never Used  Substance and Sexual Activity  . Alcohol use: Yes    Comment: Occ.   . Drug use: No  . Sexual activity: Not on file  Lifestyle  . Physical activity:    Days per week: Not on file    Minutes per session: Not on file  . Stress: Not on file  Relationships  . Social connections:    Talks on phone: Not on file    Gets together: Not on file    Attends religious service: Not on file    Active member of club or organization: Not on file    Attends meetings of clubs or organizations: Not on file    Relationship status: Not on file  Other Topics Concern  . Not on file  Social History Narrative   Lives in [place], [place], has family and friends in [place].    Allergies: No Known Allergies  Outpatient Meds: Current  Outpatient Medications  Medication Sig Dispense Refill  . amLODipine (NORVASC) 5 MG tablet Take 1 tablet (5 mg total) by mouth daily. (Patient taking differently: Take 5 mg by mouth daily at 12 noon. ) 90 tablet 1  . aspirin 81 MG tablet Take 81 mg by mouth daily at 12 noon.     Marland Kitchen atorvastatin (LIPITOR) 80 MG tablet Take 1 tablet (80 mg total) by mouth daily. (Patient taking differently: Take 80 mg by mouth daily at 12 noon. ) 90 tablet 1  . BD VEO INSULIN SYR ULTRAFINE 31G X 15/64" 1 ML MISC use as directed TO INJECT INSULIN  twice a day 10 each 0  . BRILINTA 90 MG TABS tablet Take 90 mg by mouth daily at 12 noon.   10  .  finasteride (PROSCAR) 5 MG tablet Take 1 tablet (5 mg total) by mouth daily. (Patient taking differently: Take 5 mg by mouth daily at 12 noon. 12 noon) 90 tablet 1  . furosemide (LASIX) 80 MG tablet Take 1 tablet (80 mg total) by mouth daily. 30 tablet 0  . gabapentin (NEURONTIN) 100 MG capsule Take 2 capsules (200 mg total) by mouth at bedtime. (Patient taking differently: Take 100 mg by mouth daily at 12 noon. 12 noon) 60 capsule 1  . insulin NPH-regular Human (HUMULIN 70/30) (70-30) 100 UNIT/ML injection 90 units with breakfast, and 55 units with supper (Patient taking differently: Inject 40-70 Units into the skin See admin instructions. 70 mg in the morning and 40 mg in the evening) 50 mL 3  . metFORMIN (GLUCOPHAGE) 1000 MG tablet Take 1 tablet (1,000 mg total) by mouth 2 (two) times daily with a meal. (Patient taking differently: Take 1,000 mg by mouth daily at 12 noon. 12 noon) 180 tablet 1  . metoprolol succinate (TOPROL-XL) 50 MG 24 hr tablet Take 1 tablet (50 mg total) by mouth daily. Take with or immediately following a meal. 30 tablet 0  . nitroGLYCERIN (NITROSTAT) 0.4 MG SL tablet Place 0.4 mg under the tongue as needed.    . potassium chloride SA (K-DUR,KLOR-CON) 20 MEQ tablet Take 1 tablet (20 mEq total) by mouth 2 (two) times daily. 60 tablet 0  . sacubitril-valsartan  (ENTRESTO) 49-51 MG Take 1 tablet by mouth 2 (two) times daily. 60 tablet 11  . sildenafil (REVATIO) 20 MG tablet Take 1 tablet (20 mg total) by mouth as needed. 90 tablet 1  . sucralfate (CARAFATE) 1 g tablet TAKE 1 TABLET(1 GRAM) BY MOUTH FOUR TIMES DAILY AT BEDTIME WITH MEALS (Patient taking differently: Take 1 g by mouth 3 (three) times daily. Last dose at bedtime) 360 tablet 1   No current facility-administered medications for this visit.       ___________________________________________________________________ Objective   No exam - virtual visit  Labs:  ***  Radiologic Studies:  ***  Assessment: No diagnosis found.  ***  Plan:  ***  Thank you for the courtesy of this consult.  Please call me with any questions or concerns.  Nelida Meuse III  CC: Referring provider noted above

## 2019-01-10 ENCOUNTER — Ambulatory Visit (INDEPENDENT_AMBULATORY_CARE_PROVIDER_SITE_OTHER): Payer: Medicare Other | Admitting: Gastroenterology

## 2019-01-10 ENCOUNTER — Other Ambulatory Visit: Payer: Self-pay

## 2019-01-10 ENCOUNTER — Encounter: Payer: Self-pay | Admitting: Gastroenterology

## 2019-01-10 VITALS — Ht 72.0 in | Wt 248.0 lb

## 2019-01-10 DIAGNOSIS — K219 Gastro-esophageal reflux disease without esophagitis: Secondary | ICD-10-CM | POA: Diagnosis not present

## 2019-01-10 DIAGNOSIS — R079 Chest pain, unspecified: Secondary | ICD-10-CM | POA: Diagnosis not present

## 2019-01-10 NOTE — Progress Notes (Signed)
This patient contacted our office requesting a physician telemedicine video consultation regarding clinical questions and/or test results. Interactive audio and video telecommunications were attempted between this provider and the patient.  However, this technology failed due to the patient having technical difficulties OR they did not have access to video capabilities.  We continued and completed the visit with audio only.   Participants on the conference : myself and patient   The patient consented to phone consultation and was aware that a charge will be placed through their insurance.  I was in my office and the patient was at home   Encounter time:  Total time 40 minutes, with 25 spent with patient on phone/webex  (Extensive record review and explanation of assessment and plan required)  Russell Lund, MD   _____________________________________________________________________________________________      Patient missed calls for a planned phone conference on 01/08/2019, thus it was rescheduled to today           [facility] Progress Note  Chief Complaint: Heartburn  Subjective  History:  This patient was seen in office consultation with Russell Ba, PA on 10/24/2018 for chest pain.  He had a history of coronary disease including prior CABG.  His history was most worrisome for escalating angina, and he was referred to the emergency department.  Dexilant and Carafate were prescribed at that time. Patient had elevated BNP as well as mild elevation of troponin.  Cardiac catheterization done on 10/26/2018 showed severe multivessel disease and bypass grafts, upon which no intervention could be taken.  Medical management was recommended with lipid-lowering agent, antiplatelet agents, beta-blocker, nitroglycerin and diuretics.  He was also switched from lisinopril to [facility].   Cardiology office follow-up on 11/05/2018 with Dr. Harrell Gave noted ischemic cardiomyopathy with EF 40 to  45%, recent NSTEMI referring to the admission noted above.   Russell Trevino has continued to have frequent heartburn, sometimes after meals, but often with light exertion.  He has not been taking the lingual nitroglycerin, because he believed it was for "emergencies".  He has episodes of regurgitation during the day and sometimes overnight.  He denies dysphagia, nausea or vomiting or weight loss.  ROS: Cardiovascular:  + chest pain Respiratory: no dyspnea  The patient's Past Medical, Family and Social History were reviewed and are on file in the EMR. Past Medical History:  Diagnosis Date   BPH (benign prostatic hypertrophy)    Cardiomyopathy, ischemic    Coronary artery disease    at [facility], [place] 2009 (GRAFTS PATENT), cath 05/2018 w/ SVG-PDA 100%>>med rx   Diabetes mellitus, type 2 ([facility])    Frequency of urination    Gross hematuria    History of CHF (congestive heart failure) 2007   History of kidney stones    History of myocardial infarction 2006, 2019   2006>>CABG, 2019 cath w/ med rx   History of urinary retention    Hyperlipidemia    Nocturia    Renal calculi    BILATERAL PER CT SCAN--  NON-OBSTRUCTIVE   S/P CABG x 4 2006    Objective:  Med list reviewed  Current Outpatient Medications:    amLODipine (NORVASC) 5 MG tablet, Take 1 tablet (5 mg total) by mouth daily. (Patient taking differently: Take 5 mg by mouth daily at 12 noon. ), Disp: 90 tablet, Rfl: 1   aspirin 81 MG tablet, Take 81 mg by mouth daily at 12 noon. , Disp: , Rfl:    atorvastatin (LIPITOR) 80 MG tablet, Take 1 tablet (80  mg total) by mouth daily. (Patient taking differently: Take 80 mg by mouth daily at 12 noon. ), Disp: 90 tablet, Rfl: 1   BD VEO INSULIN SYR ULTRAFINE 31G X 15/64" 1 ML MISC, use as directed TO INJECT INSULIN  twice a day, Disp: 10 each, Rfl: 0   BRILINTA 90 MG TABS tablet, Take 90 mg by mouth daily at 12 noon. , Disp: , Rfl: 10   finasteride (PROSCAR) 5 MG tablet, Take 1  tablet (5 mg total) by mouth daily. (Patient taking differently: Take 5 mg by mouth daily at 12 noon. 12 noon), Disp: 90 tablet, Rfl: 1   furosemide (LASIX) 80 MG tablet, Take 1 tablet (80 mg total) by mouth daily., Disp: 30 tablet, Rfl: 0   gabapentin (NEURONTIN) 100 MG capsule, Take 2 capsules (200 mg total) by mouth at bedtime. (Patient taking differently: Take 100 mg by mouth daily at 12 noon. 12 noon), Disp: 60 capsule, Rfl: 1   insulin NPH-regular Human (HUMULIN 70/30) (70-30) 100 UNIT/ML injection, 90 units with breakfast, and 55 units with supper (Patient taking differently: Inject 40-70 Units into the skin See admin instructions. 70 mg in the morning and 40 mg in the evening), Disp: 50 mL, Rfl: 3   metFORMIN (GLUCOPHAGE) 1000 MG tablet, Take 1 tablet (1,000 mg total) by mouth 2 (two) times daily with a meal. (Patient taking differently: Take 1,000 mg by mouth daily at 12 noon. 12 noon), Disp: 180 tablet, Rfl: 1   metoprolol succinate (TOPROL-XL) 50 MG 24 hr tablet, Take 1 tablet (50 mg total) by mouth daily. Take with or immediately following a meal., Disp: 30 tablet, Rfl: 0   nitroGLYCERIN (NITROSTAT) 0.4 MG SL tablet, Place 0.4 mg under the tongue as needed., Disp: , Rfl:    potassium chloride SA (K-DUR,KLOR-CON) 20 MEQ tablet, Take 1 tablet (20 mEq total) by mouth 2 (two) times daily., Disp: 60 tablet, Rfl: 0   sacubitril-valsartan (ENTRESTO) 49-51 MG, Take 1 tablet by mouth 2 (two) times daily., Disp: 60 tablet, Rfl: 11   sildenafil (REVATIO) 20 MG tablet, Take 1 tablet (20 mg total) by mouth as needed., Disp: 90 tablet, Rfl: 1   sucralfate (CARAFATE) 1 g tablet, TAKE 1 TABLET(1 GRAM) BY MOUTH FOUR TIMES DAILY AT BEDTIME WITH MEALS (Patient taking differently: Take 1 g by mouth 3 (three) times daily. Last dose at bedtime), Disp: 360 tablet, Rfl: 1      Recent Labs:  Cardiac catheterization report of 10/26/2018 was reviewed  Hgb A1C - 9.9 on  10/22/18  @ASSESSMENTPLANBEGIN @ Assessment: Encounter Diagnoses  Name Primary?   Gastroesophageal reflux disease, esophagitis presence not specified Yes   Cardiac chest pain    We had a long discussion about GERD and heartburn.  While he does have symptoms of GERD with heartburn and also regurgitation.  I believe that a lot of his persistent heartburn is truly angina.    His diabetes is also under suboptimal control, which could be causing some degree of delayed gastric emptying, though he does not seem to have severe gastroparesis without nausea or vomiting.  He was taking pantoprazole up to 3 tablets at a dose hoping it would relieve the heartburn.  He has also been taking sucralfate, swallowing entire tablet 3 or 4 times a day without much relief. He was unable to continue Dexilant due to its cost, but did not find it to be any more helpful than pantoprazole.  Plan:  Pantoprazole 40 mg twice daily for meals  Dissolve Carafate tablets and 2 tablespoons of water to make a slurry and therefore help coat the inside of the esophagus to relieve heartburn from any reflux he is having.  He needs better control of diabetes if possible.  I recommended he take a sublingual nitroglycerin when he has this heartburn occurring with exertion.  If that is helpful, then perhaps consideration of long-acting nitrate would be in order if felt to be appropriate by his cardiologist and if blood pressure tolerates.  I will copy them my note today to consider that, and also because I asked Jyaire to follow-up with them by phone about this.  If he has significant relief of the heartburn with the use of nitrates, then I would like him to decrease the pantoprazole to 40 mg once daily.  He was also advised to stop the sucralfate if taking it in slurry form does not relieve the heartburn.  He was encouraged to contact me as needed.   Nelida Meuse III

## 2019-01-20 ENCOUNTER — Other Ambulatory Visit: Payer: Self-pay | Admitting: Internal Medicine

## 2019-01-21 ENCOUNTER — Telehealth: Payer: Self-pay | Admitting: Pharmacist Clinician (PhC)/ Clinical Pharmacy Specialist

## 2019-01-21 MED ORDER — POTASSIUM CHLORIDE CRYS ER 20 MEQ PO TBCR: 20.0000 meq | EXTENDED_RELEASE_TABLET | Freq: Every day | ORAL | 1 refills | Status: DC

## 2019-01-21 MED ORDER — BRILINTA 90 MG PO TABS: 90.0000 mg | ORAL_TABLET | Freq: Two times a day (BID) | ORAL | 3 refills | Status: DC

## 2019-01-21 NOTE — Telephone Encounter (Signed)
See updated phone message 01/21/2019

## 2019-01-21 NOTE — Telephone Encounter (Signed)
Patient called, stated he was having problems with occasional chest pains.  Had been to his GI MD for heartburn, and states MD suspected there was some cardiac component to it.  Suggested he try using his nitroglycerin tablets to see if they brought about relief.  Patient notes that this has helped, but he is leery of taking the nitro and was calling for advice.    In reviewing his medications, it was determined that patient does not like taking meds twice daily, so takes all of his medications just once daily, around 1:30-2pm.  In reviewing cardiac medications, he should be taking both the Entresto and Brilinta twice daily.  He ran out of Brilinta and was told he couldn't refill.  He is also out of potassium (chart indicates should be on 40 mEq daily with his 80 mg of furosemide).  When he went to pharmacy for more nitroglycerin tablets, his prescription was expired, however the pharmacy gave him a 25 count bottle.    Patient states that the chest pains occur at various times in the day, sometimes with strenuous activity.  He hasn't paid much attention to any specific patterns.  I explained the purpose of the ntg tablets and asked that for the next week he take them when he felt chest pains, and keep a list of the times he uses them.  He is to call back in 1 week and let me know how often he had to use them.  Explained that if he's using multiple doses per week we may need to prescribe isosorbide mono on a daily basis, but we won't know until we know exactly what he is taking.    Regarding his stopping the Brilinta and potassium, I reviewed his chart and he should be on the Brilinta until at least 10/2019 per Dr. Judeth Cornfield last note.  And as long as he takes furosemide 80 mg daily, he should have some potassium supplementation.  His last potassium level was drawn while still hospitalized for his NSTEMI.  I will refill both of these today.  I suggested for now, since compliance is an issue, that he take  just 63mEq once daily until we can get a repeat BMET.  Regarding his lack of bid compliance, I stressed that several of his medications are not going to work very well for him if he is only taking once daily.  He does not have access to MyChart, so an activation code was sent to him.  Hopefully he will activate it this afternoon.  In the meantime I will go thru his medication list and determine a good division of morning vs evening medications.  I told him he was better off to take half of his meds in the morning, the other half at night.  Would be easier on his stomach and then he could get the BID medications in without problem.  Once his MyChart is activated I will send him a list of morning vs evening medications as well as a few tips on how to remember to take his medications as scheduled.    States his home BP readings are doing 'okay".  Some days will get readings in the 683-419'Q systolic, but other days it runs as high as 170.  I suspect his compliance may have something to do with this.  Asked him to check home BP twice daily until we talk again next Monday, then report those readings back to me.    Patient voiced understanding at all of the  above recommendations.  He is to call me next Monday after 1 pm.

## 2019-01-27 ENCOUNTER — Other Ambulatory Visit: Payer: Self-pay | Admitting: Internal Medicine

## 2019-01-28 ENCOUNTER — Other Ambulatory Visit: Payer: Self-pay | Admitting: Internal Medicine

## 2019-01-28 ENCOUNTER — Encounter (HOSPITAL_COMMUNITY): Payer: Self-pay | Admitting: Emergency Medicine

## 2019-01-28 ENCOUNTER — Emergency Department (HOSPITAL_COMMUNITY): Payer: Medicare Other

## 2019-01-28 ENCOUNTER — Other Ambulatory Visit: Payer: Self-pay

## 2019-01-28 ENCOUNTER — Inpatient Hospital Stay (HOSPITAL_COMMUNITY)
Admission: EM | Admit: 2019-01-28 | Discharge: 2019-01-30 | DRG: 280 | Disposition: A | Discharge status: Home / Self Care | Payer: Medicare Other | Attending: Interventional Cardiology | Admitting: Interventional Cardiology

## 2019-01-28 DIAGNOSIS — I255 Ischemic cardiomyopathy: Secondary | ICD-10-CM | POA: Diagnosis present

## 2019-01-28 DIAGNOSIS — I21A1 Myocardial infarction type 2: Secondary | ICD-10-CM | POA: Diagnosis present

## 2019-01-28 DIAGNOSIS — I252 Old myocardial infarction: Secondary | ICD-10-CM | POA: Diagnosis not present

## 2019-01-28 DIAGNOSIS — Z7982 Long term (current) use of aspirin: Secondary | ICD-10-CM | POA: Diagnosis not present

## 2019-01-28 DIAGNOSIS — I130 Hypertensive heart and chronic kidney disease with heart failure and stage 1 through stage 4 chronic kidney disease, or unspecified chronic kidney disease: Secondary | ICD-10-CM | POA: Diagnosis present

## 2019-01-28 DIAGNOSIS — I200 Unstable angina: Secondary | ICD-10-CM | POA: Diagnosis present

## 2019-01-28 DIAGNOSIS — Z833 Family history of diabetes mellitus: Secondary | ICD-10-CM

## 2019-01-28 DIAGNOSIS — Z794 Long term (current) use of insulin: Secondary | ICD-10-CM

## 2019-01-28 DIAGNOSIS — N183 Chronic kidney disease, stage 3 unspecified: Secondary | ICD-10-CM

## 2019-01-28 DIAGNOSIS — E785 Hyperlipidemia, unspecified: Secondary | ICD-10-CM | POA: Diagnosis present

## 2019-01-28 DIAGNOSIS — Z20828 Contact with and (suspected) exposure to other viral communicable diseases: Secondary | ICD-10-CM | POA: Diagnosis present

## 2019-01-28 DIAGNOSIS — I340 Nonrheumatic mitral (valve) insufficiency: Secondary | ICD-10-CM | POA: Diagnosis present

## 2019-01-28 DIAGNOSIS — E782 Mixed hyperlipidemia: Secondary | ICD-10-CM | POA: Diagnosis present

## 2019-01-28 DIAGNOSIS — E1159 Type 2 diabetes mellitus with other circulatory complications: Secondary | ICD-10-CM

## 2019-01-28 DIAGNOSIS — I5043 Acute on chronic combined systolic (congestive) and diastolic (congestive) heart failure: Secondary | ICD-10-CM | POA: Diagnosis present

## 2019-01-28 DIAGNOSIS — E1140 Type 2 diabetes mellitus with diabetic neuropathy, unspecified: Secondary | ICD-10-CM

## 2019-01-28 DIAGNOSIS — E1122 Type 2 diabetes mellitus with diabetic chronic kidney disease: Secondary | ICD-10-CM | POA: Diagnosis present

## 2019-01-28 DIAGNOSIS — I25110 Atherosclerotic heart disease of native coronary artery with unstable angina pectoris: Secondary | ICD-10-CM | POA: Diagnosis present

## 2019-01-28 DIAGNOSIS — E119 Type 2 diabetes mellitus without complications: Secondary | ICD-10-CM

## 2019-01-28 DIAGNOSIS — Z7901 Long term (current) use of anticoagulants: Secondary | ICD-10-CM | POA: Diagnosis not present

## 2019-01-28 DIAGNOSIS — I10 Essential (primary) hypertension: Secondary | ICD-10-CM

## 2019-01-28 DIAGNOSIS — I152 Hypertension secondary to endocrine disorders: Secondary | ICD-10-CM | POA: Diagnosis present

## 2019-01-28 DIAGNOSIS — I5042 Chronic combined systolic (congestive) and diastolic (congestive) heart failure: Secondary | ICD-10-CM | POA: Diagnosis present

## 2019-01-28 DIAGNOSIS — N400 Enlarged prostate without lower urinary tract symptoms: Secondary | ICD-10-CM | POA: Diagnosis present

## 2019-01-28 DIAGNOSIS — K219 Gastro-esophageal reflux disease without esophagitis: Secondary | ICD-10-CM | POA: Diagnosis present

## 2019-01-28 DIAGNOSIS — I272 Pulmonary hypertension, unspecified: Secondary | ICD-10-CM | POA: Diagnosis present

## 2019-01-28 DIAGNOSIS — R079 Chest pain, unspecified: Secondary | ICD-10-CM | POA: Diagnosis present

## 2019-01-28 HISTORY — DX: Gastro-esophageal reflux disease without esophagitis: K21.9

## 2019-01-28 LAB — BASIC METABOLIC PANEL
Anion gap: 13 (ref 5–15)
BUN: 36 mg/dL — ABNORMAL HIGH (ref 8–23)
CO2: 20 mmol/L — ABNORMAL LOW (ref 22–32)
Calcium: 9.3 mg/dL (ref 8.9–10.3)
Chloride: 102 mmol/L (ref 98–111)
Creatinine, Ser: 1.78 mg/dL — ABNORMAL HIGH (ref 0.61–1.24)
GFR calc Af Amer: 44 mL/min — ABNORMAL LOW (ref >60)
GFR calc non Af Amer: 38 mL/min — ABNORMAL LOW (ref >60)
Glucose, Bld: 265 mg/dL — ABNORMAL HIGH (ref 70–99)
Potassium: 4.3 mmol/L (ref 3.5–5.1)
Sodium: 135 mmol/L (ref 135–145)

## 2019-01-28 LAB — HEMOGLOBIN A1C
Hgb A1c MFr Bld: 8.5 % — ABNORMAL HIGH (ref 4.8–5.6)
Mean Plasma Glucose: 197.25 mg/dL

## 2019-01-28 LAB — CBC
HCT: 32 % — ABNORMAL LOW (ref 39.0–52.0)
Hemoglobin: 10.6 g/dL — ABNORMAL LOW (ref 13.0–17.0)
MCH: 30.2 pg (ref 26.0–34.0)
MCHC: 33.1 g/dL (ref 30.0–36.0)
MCV: 91.2 fL (ref 80.0–100.0)
Platelets: 159 10*3/uL (ref 150–400)
RBC: 3.51 MIL/uL — ABNORMAL LOW (ref 4.22–5.81)
RDW: 14.6 % (ref 11.5–15.5)
WBC: 11.1 10*3/uL — ABNORMAL HIGH (ref 4.0–10.5)
nRBC: 0 % (ref 0.0–0.2)

## 2019-01-28 LAB — TROPONIN I
Troponin I: 0.76 ng/mL (ref <0.03)
Troponin I: 1.75 ng/mL (ref <0.03)
Troponin I: 1.77 ng/mL (ref <0.03)

## 2019-01-28 LAB — MRSA PCR SCREENING: MRSA by PCR: NEGATIVE

## 2019-01-28 LAB — LIPASE, BLOOD: Lipase: 25 U/L (ref 11–51)

## 2019-01-28 LAB — BRAIN NATRIURETIC PEPTIDE: B Natriuretic Peptide: 2135 pg/mL — ABNORMAL HIGH (ref 0.0–100.0)

## 2019-01-28 LAB — SARS CORONAVIRUS 2 BY RT PCR (HOSPITAL ORDER, PERFORMED IN ~~LOC~~ HOSPITAL LAB): SARS Coronavirus 2: NEGATIVE

## 2019-01-28 LAB — CBG MONITORING, ED: Glucose-Capillary: 264 mg/dL — ABNORMAL HIGH (ref 70–99)

## 2019-01-28 LAB — GLUCOSE, CAPILLARY: Glucose-Capillary: 220 mg/dL — ABNORMAL HIGH (ref 70–99)

## 2019-01-28 MED ORDER — LORAZEPAM 2 MG/ML IJ SOLN
1.0000 mg | Freq: Once | INTRAMUSCULAR | Status: AC
  Administered 2019-01-28: 1 mg via INTRAVENOUS
  Filled 2019-01-28: qty 1

## 2019-01-28 MED ORDER — SODIUM CHLORIDE 0.9 % IV SOLN: INTRAVENOUS | Status: DC

## 2019-01-28 MED ORDER — METOPROLOL SUCCINATE ER 50 MG PO TB24
50.0000 mg | ORAL_TABLET | Freq: Every day | ORAL | Status: DC
  Administered 2019-01-28 – 2019-01-30 (×3): 50 mg via ORAL
  Filled 2019-01-28 (×3): qty 1

## 2019-01-28 MED ORDER — HEPARIN (PORCINE) 25000 UT/250ML-% IV SOLN
1400.0000 [IU]/h | INTRAVENOUS | Status: DC
  Administered 2019-01-28: 1200 [IU]/h via INTRAVENOUS
  Administered 2019-01-29: 1400 [IU]/h via INTRAVENOUS
  Filled 2019-01-28 (×2): qty 250

## 2019-01-28 MED ORDER — INSULIN ASPART PROT & ASPART (70-30 MIX) 100 UNIT/ML ~~LOC~~ SUSP
40.0000 [IU] | Freq: Every day | SUBCUTANEOUS | Status: DC
  Administered 2019-01-29: 06:00:00 40 [IU] via SUBCUTANEOUS
  Filled 2019-01-28: qty 10

## 2019-01-28 MED ORDER — ASPIRIN EC 81 MG PO TBEC: 81.0000 mg | DELAYED_RELEASE_TABLET | Freq: Every day | ORAL | Status: DC

## 2019-01-28 MED ORDER — GABAPENTIN 100 MG PO CAPS: 200.0000 mg | ORAL_CAPSULE | Freq: Every day | ORAL | Status: DC

## 2019-01-28 MED ORDER — ATORVASTATIN CALCIUM 80 MG PO TABS
80.0000 mg | ORAL_TABLET | Freq: Every day | ORAL | Status: DC
  Administered 2019-01-28 – 2019-01-30 (×3): 80 mg via ORAL
  Filled 2019-01-28 (×3): qty 1

## 2019-01-28 MED ORDER — HEPARIN BOLUS VIA INFUSION
4000.0000 [IU] | Freq: Once | INTRAVENOUS | Status: AC
  Administered 2019-01-28: 4000 [IU] via INTRAVENOUS
  Filled 2019-01-28: qty 4000

## 2019-01-28 MED ORDER — FUROSEMIDE 10 MG/ML IJ SOLN: 40.0000 mg | Freq: Three times a day (TID) | INTRAMUSCULAR | Status: DC

## 2019-01-28 MED ORDER — FINASTERIDE 5 MG PO TABS
5.0000 mg | ORAL_TABLET | Freq: Every day | ORAL | Status: DC
  Administered 2019-01-28 – 2019-01-30 (×3): 5 mg via ORAL
  Filled 2019-01-28 (×3): qty 1

## 2019-01-28 MED ORDER — FUROSEMIDE 10 MG/ML IJ SOLN
80.0000 mg | Freq: Once | INTRAMUSCULAR | Status: AC
  Administered 2019-01-28: 80 mg via INTRAVENOUS
  Filled 2019-01-28: qty 8

## 2019-01-28 MED ORDER — NITROGLYCERIN IN D5W 200-5 MCG/ML-% IV SOLN
0.0000 ug/min | INTRAVENOUS | Status: DC
  Administered 2019-01-28: 5 ug/min via INTRAVENOUS
  Filled 2019-01-28: qty 250

## 2019-01-28 MED ORDER — SUCRALFATE 1 G PO TABS
1.0000 g | ORAL_TABLET | Freq: Three times a day (TID) | ORAL | Status: DC
  Administered 2019-01-28 – 2019-01-30 (×6): 1 g via ORAL
  Filled 2019-01-28 (×6): qty 1

## 2019-01-28 MED ORDER — ONDANSETRON HCL 4 MG/2ML IJ SOLN
4.0000 mg | Freq: Once | INTRAMUSCULAR | Status: AC
  Administered 2019-01-28: 4 mg via INTRAVENOUS
  Filled 2019-01-28: qty 2

## 2019-01-28 MED ORDER — ASPIRIN 81 MG PO CHEW
324.0000 mg | CHEWABLE_TABLET | Freq: Once | ORAL | Status: AC
  Administered 2019-01-28: 324 mg via ORAL
  Filled 2019-01-28: qty 4

## 2019-01-28 MED ORDER — PANTOPRAZOLE SODIUM 40 MG PO TBEC
40.0000 mg | DELAYED_RELEASE_TABLET | Freq: Two times a day (BID) | ORAL | Status: DC
  Administered 2019-01-28 – 2019-01-30 (×4): 40 mg via ORAL
  Filled 2019-01-28 (×4): qty 1

## 2019-01-28 MED ORDER — ZOLPIDEM TARTRATE 5 MG PO TABS
5.0000 mg | ORAL_TABLET | Freq: Every evening | ORAL | Status: DC | PRN
  Administered 2019-01-28 – 2019-01-29 (×2): 5 mg via ORAL
  Filled 2019-01-28 (×2): qty 1

## 2019-01-28 MED ORDER — ALUM & MAG HYDROXIDE-SIMETH 200-200-20 MG/5ML PO SUSP
30.0000 mL | Freq: Once | ORAL | Status: AC
  Administered 2019-01-28: 30 mL via ORAL
  Filled 2019-01-28: qty 30

## 2019-01-28 MED ORDER — ASPIRIN 81 MG PO CHEW
CHEWABLE_TABLET | ORAL | Status: AC
  Filled 2019-01-28: qty 2

## 2019-01-28 MED ORDER — FUROSEMIDE 10 MG/ML IJ SOLN: 40.0000 mg | Freq: Two times a day (BID) | INTRAMUSCULAR | Status: DC

## 2019-01-28 MED ORDER — POTASSIUM CHLORIDE CRYS ER 20 MEQ PO TBCR
20.0000 meq | EXTENDED_RELEASE_TABLET | Freq: Every day | ORAL | Status: DC
  Administered 2019-01-28 – 2019-01-29 (×2): 20 meq via ORAL
  Filled 2019-01-28 (×2): qty 1

## 2019-01-28 MED ORDER — NITROGLYCERIN 0.4 MG SL SUBL: 0.4000 mg | SUBLINGUAL_TABLET | SUBLINGUAL | Status: DC | PRN

## 2019-01-28 MED ORDER — NITROGLYCERIN 2 % TD OINT
0.5000 [in_us] | TOPICAL_OINTMENT | Freq: Once | TRANSDERMAL | Status: AC
  Administered 2019-01-28: 0.5 [in_us] via TOPICAL
  Filled 2019-01-28: qty 1

## 2019-01-28 MED ORDER — FAMOTIDINE IN NACL 20-0.9 MG/50ML-% IV SOLN
20.0000 mg | Freq: Once | INTRAVENOUS | Status: AC
  Administered 2019-01-28: 20 mg via INTRAVENOUS
  Filled 2019-01-28: qty 50

## 2019-01-28 MED ORDER — HYDROMORPHONE HCL 1 MG/ML IJ SOLN
0.7500 mg | Freq: Once | INTRAMUSCULAR | Status: AC
  Administered 2019-01-28: 0.75 mg via INTRAVENOUS
  Filled 2019-01-28: qty 1

## 2019-01-28 MED ORDER — ACETAMINOPHEN 325 MG PO TABS
650.0000 mg | ORAL_TABLET | ORAL | Status: DC | PRN
  Administered 2019-01-29 (×2): 650 mg via ORAL
  Filled 2019-01-28 (×2): qty 2

## 2019-01-28 MED ORDER — DICYCLOMINE HCL 10 MG/5ML PO SOLN
10.0000 mg | Freq: Once | ORAL | Status: AC
  Administered 2019-01-28: 10 mg via ORAL
  Filled 2019-01-28: qty 5

## 2019-01-28 MED ORDER — FUROSEMIDE 10 MG/ML IJ SOLN
40.0000 mg | Freq: Two times a day (BID) | INTRAMUSCULAR | Status: AC
  Administered 2019-01-28 – 2019-01-29 (×2): 40 mg via INTRAVENOUS
  Filled 2019-01-28 (×2): qty 4

## 2019-01-28 MED ORDER — INSULIN ASPART 100 UNIT/ML ~~LOC~~ SOLN
0.0000 [IU] | Freq: Three times a day (TID) | SUBCUTANEOUS | Status: DC
  Administered 2019-01-28: 5 [IU] via SUBCUTANEOUS
  Administered 2019-01-29: 1 [IU] via SUBCUTANEOUS
  Administered 2019-01-29: 2 [IU] via SUBCUTANEOUS
  Administered 2019-01-29: 3 [IU] via SUBCUTANEOUS
  Administered 2019-01-30: 2 [IU] via SUBCUTANEOUS

## 2019-01-28 MED ORDER — POTASSIUM CHLORIDE CRYS ER 20 MEQ PO TBCR
60.0000 meq | EXTENDED_RELEASE_TABLET | Freq: Once | ORAL | Status: AC
  Administered 2019-01-28: 60 meq via ORAL
  Filled 2019-01-28: qty 3

## 2019-01-28 MED ORDER — SACUBITRIL-VALSARTAN 49-51 MG PO TABS
1.0000 | ORAL_TABLET | Freq: Two times a day (BID) | ORAL | Status: DC
  Administered 2019-01-28 – 2019-01-29 (×2): 1 via ORAL
  Filled 2019-01-28 (×3): qty 1

## 2019-01-28 MED ORDER — LIDOCAINE VISCOUS HCL 2 % MT SOLN
15.0000 mL | Freq: Once | OROMUCOSAL | Status: AC
  Administered 2019-01-28: 15 mL via ORAL
  Filled 2019-01-28: qty 15

## 2019-01-28 MED ORDER — TICAGRELOR 90 MG PO TABS
90.0000 mg | ORAL_TABLET | Freq: Two times a day (BID) | ORAL | Status: DC
  Administered 2019-01-28 – 2019-01-30 (×4): 90 mg via ORAL
  Filled 2019-01-28 (×4): qty 1

## 2019-01-28 MED ORDER — SODIUM CHLORIDE 0.9% FLUSH: 3.0000 mL | Freq: Once | INTRAVENOUS | Status: DC

## 2019-01-28 MED ORDER — AMLODIPINE BESYLATE 5 MG PO TABS
5.0000 mg | ORAL_TABLET | Freq: Every day | ORAL | Status: DC
  Administered 2019-01-28 – 2019-01-29 (×2): 5 mg via ORAL
  Filled 2019-01-28 (×2): qty 1

## 2019-01-28 MED ORDER — ASPIRIN EC 81 MG PO TBEC
81.0000 mg | DELAYED_RELEASE_TABLET | Freq: Every day | ORAL | Status: DC
  Administered 2019-01-29 – 2019-01-30 (×2): 81 mg via ORAL
  Filled 2019-01-28 (×2): qty 1

## 2019-01-28 MED ORDER — INSULIN ASPART 100 UNIT/ML ~~LOC~~ SOLN
0.0000 [IU] | Freq: Every day | SUBCUTANEOUS | Status: DC
  Administered 2019-01-28: 2 [IU] via SUBCUTANEOUS

## 2019-01-28 MED ORDER — ONDANSETRON HCL 4 MG/2ML IJ SOLN
4.0000 mg | Freq: Four times a day (QID) | INTRAMUSCULAR | Status: DC | PRN
  Administered 2019-01-29: 4 mg via INTRAVENOUS
  Filled 2019-01-28: qty 2

## 2019-01-28 MED ORDER — FENTANYL CITRATE (PF) 100 MCG/2ML IJ SOLN
12.5000 ug | INTRAMUSCULAR | Status: DC | PRN
  Administered 2019-01-28 – 2019-01-29 (×2): 12.5 ug via INTRAVENOUS
  Filled 2019-01-28 (×2): qty 2

## 2019-01-28 NOTE — ED Notes (Signed)
ED TO INPATIENT HANDOFF REPORT  ED Nurse Name and Phone #: [phone] [place] Name/Age/Gender Russell Trevino 68 y.o. male Room/Bed: 018C/018C  Code Status   Code Status: Prior  Home/SNF/Other Home Patient oriented to: self, place, time and situation Is this baseline? Yes   Triage Complete: Triage complete  Chief Complaint cp  Triage Note Cp started last night about 5 pm  Feels like heartburn  Has taken 6 nitro has been gagging and burping he states   Allergies No Known Allergies  Level of Care/Admitting Diagnosis ED Disposition    ED Disposition Condition Comment   [facility]: [place] [[id number]]  Level of Care: Progressive [102]  Covid Evaluation: N/A  Diagnosis: Unstable angina [facility]) [[id number]]  Admitting Physician: Belva Crome [[id number]]  Attending Physician: Belva Crome [[id number]]  Estimated length of stay: past midnight tomorrow  Certification:: I certify this patient will need inpatient services for at least 2 midnights  Bed request comments: 3E  PT Class (Do Not Modify): Inpatient [101]  PT Acc Code (Do Not Modify): Private [1]       B Medical/Surgery History Past Medical History:  Diagnosis Date  . BPH (benign prostatic hypertrophy)   . Cardiomyopathy, ischemic   . Coronary artery disease    at [facility], [place] 2009 (GRAFTS PATENT), cath 05/2018 w/ SVG-PDA 100%>>med rx  . Diabetes mellitus, type 2 ([place])   . Frequency of urination   . GERD (gastroesophageal reflux disease)   . Gross hematuria   . History of CHF (congestive heart failure) 2007  . History of kidney stones   . History of myocardial infarction 2006, 2019   2006>>CABG, 2019 cath w/ med rx  . History of urinary retention   . Hyperlipidemia   . Nocturia   . Renal calculi    BILATERAL PER CT SCAN--  NON-OBSTRUCTIVE  . S/P CABG x 4 2006   Past Surgical History:  Procedure Laterality Date  . CARDIAC CATHETERIZATION  01/17/2008   GRAFTS PATENT / EF 40%,  DR  SANTOS ([facility])  . CORONARY ARTERY BYPASS GRAFT  04/2005   LIMA-LAD, L radial-OM-D1 and SVG-PDA  . CYSTO / RIGHT URETERAL STENT PLACEMENT  10-08-1999  . CYSTO/  REMOVAL BLADDER STONES  01-14-2010  . CYSTOSCOPY  10/16/2012   Procedure: CYSTOSCOPY;  Surgeon: Malka So, MD;  Location: [facility];  Service: Urology;  Laterality: N/A;  Cystoscopy with Clot Evacuation and fulguration bladder neck.  Marland Kitchen EXTRACORPOREAL SHOCK WAVE LITHOTRIPSY  10-15-2009   RIGHT  . KNEE ARTHROSCOPY  1978   RIGHT  . LEFT SHOULDER SURGERY   2002  . LUMBAR FUSION  09-10-2008   L4 - L5  . PERCUTANEOUS NEPHROSTOLITHOTOMY  1979  . PROSTATE BIOPSY  10/16/2012   Procedure: PROSTATE BIOPSY;  Surgeon: Malka So, MD;  Location: [facility];  Service: Urology;  Laterality: N/A;  Through ultrasound.  Marland Kitchen RIGHT/LEFT HEART CATH AND CORONARY/GRAFT ANGIOGRAPHY N/A 10/26/2018   Procedure: RIGHT/LEFT HEART CATH AND CORONARY/GRAFT ANGIOGRAPHY;  Surgeon: Martinique, Peter M, MD;  Location: [facility];  Service: Cardiovascular;  Laterality: N/A;  . SHOULDER ARTHROSCOPY W/ SUBACROMIAL DECOMPRESSION AND DISTAL CLAVICLE EXCISION  10-11-2004   AND PARTIAL ROTATOR CUFF REPAIR  . TRANSTHORACIC ECHOCARDIOGRAM  13/24/4010   LV SYSTOLIC FUNCTION MILDLY REDUCED/ EF 48%/ LV FILLING PATTERN  IS IMPAIRED/ HYPOKINESIS IN THE MID AND BASALAR SEPTUM & ANTEROSEPTUM  . TURP VAPORIZATION  01-19-2010   BPH  W/ BOO  . URETEROLITHOTOMY  1976     A IV Location/Drains/Wounds Patient Lines/Drains/Airways Status   Active Line/Drains/Airways    Name:   Placement date:   Placement time:   Site:   Days:   Peripheral IV 01/28/19 Right Antecubital   01/28/19    0922    Antecubital   less than 1   Incision 10/16/12 Perineum Other (Comment)   10/16/12    0930     2295          Intake/Output Last 24 hours No intake or output data in the 24 hours ending 01/28/19 1917  Labs/Imaging Results for orders placed or performed  during the hospital encounter of 01/28/19 (from the past 48 hour(s))  Basic metabolic panel     Status: Abnormal   Collection Time: 01/28/19  9:23 AM  Result Value Ref Range   Sodium 135 135 - 145 mmol/L   Potassium 4.3 3.5 - 5.1 mmol/L   Chloride 102 98 - 111 mmol/L   CO2 20 (L) 22 - 32 mmol/L   Glucose, Bld 265 (H) 70 - 99 mg/dL   BUN 36 (H) 8 - 23 mg/dL   Creatinine, Ser 1.78 (H) 0.61 - 1.24 mg/dL   Calcium 9.3 8.9 - 10.3 mg/dL   GFR calc non Af Amer 38 (L) >60 mL/min   GFR calc Af Amer 44 (L) >60 mL/min   Anion gap 13 5 - 15    Comment: Performed at [facility], [address], [place], [place] [address]  CBC     Status: Abnormal   Collection Time: 01/28/19  9:23 AM  Result Value Ref Range   WBC 11.1 (H) 4.0 - 10.5 K/uL   RBC 3.51 (L) 4.22 - 5.81 MIL/uL   Hemoglobin 10.6 (L) 13.0 - 17.0 g/dL   HCT 32.0 (L) 39.0 - 52.0 %   MCV 91.2 80.0 - 100.0 fL   MCH 30.2 26.0 - 34.0 pg   MCHC 33.1 30.0 - 36.0 g/dL   RDW 14.6 11.5 - 15.5 %   Platelets 159 150 - 400 K/uL   nRBC 0.0 0.0 - 0.2 %    Comment: Performed at [facility], [place] [address] [place], [place] [address]  Troponin I - ONCE - STAT     Status: Abnormal   Collection Time: 01/28/19  9:23 AM  Result Value Ref Range   Troponin I 0.76 (HH) <0.03 ng/mL    Comment: CRITICAL RESULT CALLED TO, READ BACK BY AND VERIFIED WITH: J.ROSELLI,RN 1221 01/28/2019 CLARK,S Performed at [facility] [address], [place], [place] [address]   Lipase, blood     Status: None   Collection Time: 01/28/19  9:23 AM  Result Value Ref Range   Lipase 25 11 - 51 U/L    Comment: Performed at [facility] [address], [place], [place] [address]  SARS Coronavirus 2 (CEPHEID - Performed in [place] hospital lab), Hosp Order     Status: None   Collection Time: 01/28/19 12:44 PM  Result Value Ref Range   SARS Coronavirus 2 NEGATIVE NEGATIVE    Comment: (NOTE) If result is NEGATIVE SARS-CoV-2 target nucleic acids are  NOT DETECTED. The SARS-CoV-2 RNA is generally detectable in upper and lower  respiratory specimens during the acute phase of infection. The lowest  concentration of SARS-CoV-2 viral copies this assay can detect is 250  copies / mL. A negative result does not preclude SARS-CoV-2  infection  and should not be used as the sole basis for treatment or other  patient management decisions.  A negative result may occur with  improper specimen collection / handling, submission of specimen other  than nasopharyngeal swab, presence of viral mutation(s) within the  areas targeted by this assay, and inadequate number of viral copies  (<250 copies / mL). A negative result must be combined with clinical  observations, patient history, and epidemiological information. If result is POSITIVE SARS-CoV-2 target nucleic acids are DETECTED. The SARS-CoV-2 RNA is generally detectable in upper and lower  respiratory specimens dur ing the acute phase of infection.  Positive  results are indicative of active infection with SARS-CoV-2.  Clinical  correlation with patient history and other diagnostic information is  necessary to determine patient infection status.  Positive results do  not rule out bacterial infection or co-infection with other viruses. If result is PRESUMPTIVE POSTIVE SARS-CoV-2 nucleic acids MAY BE PRESENT.   A presumptive positive result was obtained on the submitted specimen  and confirmed on repeat testing.  While 2019 novel coronavirus  (SARS-CoV-2) nucleic acids may be present in the submitted sample  additional confirmatory testing may be necessary for epidemiological  and / or clinical management purposes  to differentiate between  SARS-CoV-2 and other Sarbecovirus currently known to infect humans.  If clinically indicated additional testing with an alternate test  methodology [phone]) is advised. The SARS-CoV-2 RNA is generally  detectable in upper and lower respiratory sp ecimens  during the acute  phase of infection. The expected result is Negative. Fact Sheet for Patients:  StrictlyIdeas.no Fact Sheet for Healthcare Providers: BankingDealers.co.za This test is not yet approved or cleared by the [place] FDA and has been authorized for detection and/or diagnosis of SARS-CoV-2 by FDA under an Emergency Use Authorization (EUA).  This EUA will remain in effect (meaning this test can be used) for the duration of the COVID-19 declaration under Section 564(b)(1) of the Act, 21 U.S.C. section 360bbb-3(b)(1), unless the authorization is terminated or revoked sooner. Performed at [facility], [place] [address], [place],  [address]   CBG monitoring, ED     Status: Abnormal   Collection Time: 01/28/19  6:09 PM  Result Value Ref Range   Glucose-Capillary 264 (H) 70 - 99 mg/dL   Dg Chest 2 View  Result Date: 01/28/2019 CLINICAL DATA:  Chest pain EXAM: CHEST - 2 VIEW COMPARISON:  October 25, 2018 FINDINGS: There is no edema or consolidation. Heart is mildly enlarged with pulmonary vascularity normal. No adenopathy. Patient is status post coronary artery bypass grafting. There is mild degenerative change in the thoracic spine. IMPRESSION: Mild cardiac enlargement. Postoperative change. No edema or consolidation. Electronically Signed   By: Lowella Grip III M.D.   On: 01/28/2019 09:53    Pending Labs Unresulted Labs (From admission, onward)    Start     Ordered   01/29/19 1540  Basic metabolic panel  Tomorrow morning,   R     01/28/19 1701   01/29/19 0500  CBC  Daily,   R     01/28/19 1704   01/28/19 2300  Heparin level (unfractionated)  Once-Timed,   R     01/28/19 1704   01/28/19 1701  Troponin I - Now Then Q6H  Now then every 6 hours,   R     01/28/19 1701   01/28/19 1649  Brain natriuretic peptide  Once,   R     01/28/19  1648   Signed and Held  Magnesium  Once,   R     Signed and Held   Signed  and Held  Hepatic function panel  Once,   R     Signed and Held   Signed and Held  TSH  Once,   R     Signed and Held   Signed and Held  T4, free  Once,   R     Signed and Held   Signed and Held  Troponin I - Now Then Q6H  Now then every 6 hours,   STAT     Signed and Held   Signed and Held  Hemoglobin A1c  Once,   R     Signed and Held   Signed and Held  Basic metabolic panel  Tomorrow morning,   R     Signed and Held   Signed and Held  Lipid panel  Tomorrow morning,   R     Signed and Held   Signed and Held  CBC  Tomorrow morning,   R     Signed and Held          Vitals/Pain Today's Vitals   01/28/19 1300 01/28/19 1330 01/28/19 1400 01/28/19 1500  BP: 137/74 122/69 (!) 122/58 129/70  Pulse: 73 71 69 66  Resp: 16 19 16 16   Temp:      SpO2: 94% 95% (!) 89% 94%  Weight:      Height:      PainSc:        Isolation Precautions No active isolations  Medications Medications  sodium chloride flush (NS) 0.9 % injection 3 mL (has no administration in time range)  heparin ADULT infusion 100 units/mL (25000 units/215mL sodium chloride 0.45%) (1,200 Units/hr Intravenous New Bag/Given 01/28/19 1812)  furosemide (LASIX) injection 40 mg (40 mg Intravenous Given 01/28/19 1818)  insulin aspart (novoLOG) injection 0-9 Units (5 Units Subcutaneous Given 01/28/19 1824)  insulin aspart (novoLOG) injection 0-5 Units (has no administration in time range)  famotidine (PEPCID) IVPB 20 mg premix (0 mg Intravenous Stopped 01/28/19 1042)  alum & mag hydroxide-simeth (MAALOX/MYLANTA) 200-200-20 MG/5ML suspension 30 mL (30 mLs Oral Given 01/28/19 1010)    And  lidocaine (XYLOCAINE) 2 % viscous mouth solution 15 mL (15 mLs Oral Given 01/28/19 1010)  dicyclomine (BENTYL) 10 MG/5ML syrup 10 mg (10 mg Oral Given 01/28/19 1052)  ondansetron (ZOFRAN) injection 4 mg (4 mg Intravenous Given 01/28/19 1004)  HYDROmorphone (DILAUDID) injection 0.75 mg (0.75 mg Intravenous Given 01/28/19 1041)  LORazepam (ATIVAN)  injection 1 mg (1 mg Intravenous Given 01/28/19 1042)  furosemide (LASIX) injection 80 mg (80 mg Intravenous Given 01/28/19 1204)  potassium chloride SA (K-DUR) CR tablet 60 mEq (60 mEq Oral Given 01/28/19 1203)  aspirin chewable tablet 324 mg (324 mg Oral Given 01/28/19 1249)  nitroGLYCERIN (NITROGLYN) 2 % ointment 0.5 inch (0.5 inches Topical Given 01/28/19 1249)  heparin bolus via infusion 4,000 Units (4,000 Units Intravenous Bolus from Bag 01/28/19 1813)    Mobility walks with person assist Low fall risk   Focused Assessments Cardiac Assessment Handoff:    Lab Results  Component Value Date   TROPONINI 0.76 ([place]) 01/28/2019   No results found for: DDIMER Does the Patient currently have chest pain? Yes     R Recommendations: See Admitting Provider Note  Report given to:   Additional Notes: N/A

## 2019-01-28 NOTE — H&P (Addendum)
Cardiology History and Physical   The patient has been seen in conjunction with Cecilie Kicks, NP. All aspects of care have been considered and discussed. The patient has been personally interviewed, examined, and all clinical data has been reviewed.   Known severe three-vessel native coronary total occlusion, and saphenous vein graft failure in a patient with ischemic cardiomyopathy that produces combined systolic and diastolic heart failure.  He ran out of furosemide 2 weeks ago and has gradually noted increasing lower extremity swelling and orthopnea.  This is occurred in conjunction with burning discomfort that responds to sublingual nitroglycerin.  Suspect acute on chronic combined systolic and diastolic heart failure due to absence of furosemide for 2 weeks.  Unstable angina due to increased LV volume/diameter which is compromising collateral blood flow due to increased LV wall tension.  Plan is IV Lasix to return to euvolemia.  IV nitroglycerin to control angina.  Anticipate once euvolemic, will be able to discharge on typical medical regimen, perhaps using long-acting nitrates in addition.  No plan for coronary angiography unless unsuspected enzyme elevation, EKG changes, or other clinical scenario develops.   Patient ID: Russell Trevino MRN: [id number]; DOB: [birth year]  Admit date: 01/28/2019 Date of Consult: 01/28/2019  Primary Care Provider: Binnie Rail, MD Primary Cardiologist: Buford Dresser, MD  Primary Electrophysiologist:  None   CC: chest pain   Patient Profile:   Russell Trevino is a 68 y.o. male with a hx of CAD s/p prior CABG X 4 in 2006 and recent NSTEMI in Feb 2020 and ICM with EF 40-45%, last cath 2/14/200 with patent grafts except VG to RCA which is known to be occluded and distal RCA supplied by collaterals, who is being seen today for the evaluation of chest pain at the request of Dr. Wilson Singer.   History of Present Illness:   Mr. Burget with above  cardiac hx of CABG 2006 LIMA-LAD; L radial to OM-D1, and VG to PDA and then NStEMI 10/2018, EF 40-45% and recent cath for chest pain with significant native CAD but patent grafts except known occluded FG to RCA. Is also followed by GI for GERD and heartburn.  He id on protonix 40 BID, and carafate . Other hx of HLD, kidney stones and DM-2.    In Feb 2020 admitted with chest pain and SOB. His heartburn would start after meals or with exertion.  Rest or Prilosec would improve.  GI meds adjusted but overall no real improvement.  Then he had increased of SOB.  He is on Brilinta and has remained.  Troponin then 1.33.  Also poorly controlled DM with hgb A1C at 9.9.  Echo at that time with EF 40-45%, cavity severely dilated.  He went on to have Cardiac cath with native coronary arteries 100% stenosis and patent LIMA to LAD, Patent free radial to supplying the 1st diag and OM, VG to RCA is known to be occluded and the dRCA is supplied by collaterals.  He had mild pulmonary hypertension.  Discharged 10/27/18 with amlodipine, lisinopril, toprol XL, K+, spironolactone, and tricagrelor. Not always up to date on taking his meds.   At his follow up he was stable.  Though heartburn continued.  He was started on [facility] 11/05/18.  His lisinopril was stopped.  Today he presents to ER with chest pain that feels like heartburn.  He took 6 NTG and he continued with gagging and burping.   It has been terrible for last 3 days.  10/10 sharp pain in  central of chest. Some into arms.  It is the worst with lying down, with exercise and with eating.  + retching and a lot of stomach gurgling.  No colds of fevers.  EKG without acute changes.  He has taken NTG and pain resolves for short period of time and returns.  He could not deal with this anymore and came to ER.  BP 146/70 to 128/69 glucose elevated.     Labs this admit Na 135, K+ 4.3 Cr 1.78  Troponin 0.76.  Hgb 10.6, HCT 32  Cepheid lab for COVID neg   CXR:  Mild cardiac  enlargement. Postoperative change. No edema or Consolidation    EKG:  The EKG was personally reviewed and demonstrates:  SR with IVCD, LVH no acute changes from 11/05/18.  Telemetry:  Telemetry was personally reviewed and demonstrates:  SR     Past Medical History:  Diagnosis Date  . BPH (benign prostatic hypertrophy)   . Cardiomyopathy, ischemic   . Coronary artery disease    at [facility], [place] 2009 (GRAFTS PATENT), cath 05/2018 w/ SVG-PDA 100%>>med rx  . Diabetes mellitus, type 2 ([place])   . Frequency of urination   . Gross hematuria   . History of CHF (congestive heart failure) 2007  . History of kidney stones   . History of myocardial infarction 2006, 2019   2006>>CABG, 2019 cath w/ med rx  . History of urinary retention   . Hyperlipidemia   . Nocturia   . Renal calculi    BILATERAL PER CT SCAN--  NON-OBSTRUCTIVE  . S/P CABG x 4 2006    Past Surgical History:  Procedure Laterality Date  . CARDIAC CATHETERIZATION  01/17/2008   GRAFTS PATENT / EF 40%,  DR SANTOS ([facility])  . CORONARY ARTERY BYPASS GRAFT  04/2005   LIMA-LAD, L radial-OM-D1 and SVG-PDA  . CYSTO / RIGHT URETERAL STENT PLACEMENT  10-08-1999  . CYSTO/  REMOVAL BLADDER STONES  01-14-2010  . CYSTOSCOPY  10/16/2012   Procedure: CYSTOSCOPY;  Surgeon: Malka So, MD;  Location: [facility];  Service: Urology;  Laterality: N/A;  Cystoscopy with Clot Evacuation and fulguration bladder neck.  Marland Kitchen EXTRACORPOREAL SHOCK WAVE LITHOTRIPSY  10-15-2009   RIGHT  . KNEE ARTHROSCOPY  1978   RIGHT  . LEFT SHOULDER SURGERY   2002  . LUMBAR FUSION  09-10-2008   L4 - L5  . PERCUTANEOUS NEPHROSTOLITHOTOMY  1979  . PROSTATE BIOPSY  10/16/2012   Procedure: PROSTATE BIOPSY;  Surgeon: Malka So, MD;  Location: [facility];  Service: Urology;  Laterality: N/A;  Through ultrasound.  Marland Kitchen RIGHT/LEFT HEART CATH AND CORONARY/GRAFT ANGIOGRAPHY N/A 10/26/2018   Procedure: RIGHT/LEFT HEART CATH AND CORONARY/GRAFT  ANGIOGRAPHY;  Surgeon: Martinique, Peter M, MD;  Location: [facility];  Service: Cardiovascular;  Laterality: N/A;  . SHOULDER ARTHROSCOPY W/ SUBACROMIAL DECOMPRESSION AND DISTAL CLAVICLE EXCISION  10-11-2004   AND PARTIAL ROTATOR CUFF REPAIR  . TRANSTHORACIC ECHOCARDIOGRAM  37/90/2409   LV SYSTOLIC FUNCTION MILDLY REDUCED/ EF 48%/ LV FILLING PATTERN  IS IMPAIRED/ HYPOKINESIS IN THE MID AND BASALAR SEPTUM & ANTEROSEPTUM  . TURP VAPORIZATION  01-19-2010   BPH W/ BOO  . URETEROLITHOTOMY  1976     Home Medications:  Prior to Admission medications   Medication Sig Start Date End Date Taking? Authorizing Provider  amLODipine (NORVASC) 5 MG tablet Take 1 tablet (5 mg total) by mouth daily. Patient taking differently: Take 5 mg by mouth daily at 12  noon.  09/19/18  Yes Binnie Rail, MD  aspirin 81 MG tablet Take 81 mg by mouth daily at 12 noon.    Yes [provider]  atorvastatin (LIPITOR) 80 MG tablet Take 1 tablet (80 mg total) by mouth daily. Patient taking differently: Take 80 mg by mouth daily at 12 noon.  09/19/18  Yes Burns, Claudina Lick, MD  BRILINTA 90 MG TABS tablet Take 1 tablet (90 mg total) by mouth 2 (two) times daily. 01/21/19  Yes Buford Dresser, MD  finasteride (PROSCAR) 5 MG tablet Take 1 tablet (5 mg total) by mouth daily. Patient taking differently: Take 5 mg by mouth daily at 12 noon. 12 noon 09/19/18  Yes Burns, Claudina Lick, MD  furosemide (LASIX) 80 MG tablet Take 1 tablet (80 mg total) by mouth daily. 10/28/18  Yes Sheikh, Omair Latif, DO  insulin NPH-regular Human (HUMULIN 70/30) (70-30) 100 UNIT/ML injection 90 units with breakfast, and 55 units with supper Patient taking differently: Inject 40-70 Units into the skin See admin instructions. 70 mg in the morning and 40 mg in the evening 11/03/17  Yes Renato Shin, MD  metoprolol succinate (TOPROL-XL) 50 MG 24 hr tablet Take 1 tablet (50 mg total) by mouth daily. Take with or immediately following a meal. 10/28/18  Yes  Sheikh, Omair Latif, DO  nitroGLYCERIN (NITROSTAT) 0.4 MG SL tablet DISSOLVE 1 TABLET UNDER THE TONGUE EVERY 5 MINUTES AS NEEDED FOR CHEST PAIN. CALL MD/911 IF TAKE 2 DOSES. MAX 3 DOSES/DAY 01/21/19  Yes Burns, Claudina Lick, MD  pantoprazole (PROTONIX) 40 MG tablet Take 40 mg by mouth 2 (two) times a day. 12/19/18  Yes [provider]  potassium chloride SA (K-DUR) 20 MEQ tablet Take 1 tablet (20 mEq total) by mouth daily. 01/21/19  Yes Buford Dresser, MD  sacubitril-valsartan (ENTRESTO) 49-51 MG Take 1 tablet by mouth 2 (two) times daily. 11/05/18  Yes Buford Dresser, MD  sildenafil (REVATIO) 20 MG tablet Take 1 tablet (20 mg total) by mouth as needed. Patient taking differently: Take 20 mg by mouth as needed (erectile dysfunction).  12/18/18  Yes Burns, Claudina Lick, MD  sucralfate (CARAFATE) 1 g tablet TAKE 1 TABLET(1 GRAM) BY MOUTH FOUR TIMES DAILY AT BEDTIME WITH MEALS Patient taking differently: Take 1 g by mouth 3 (three) times daily. Last dose at bedtime 10/22/18  Yes Burns, Claudina Lick, MD  BD VEO INSULIN SYR ULTRAFINE 31G X 15/64" 1 ML MISC use as directed TO INJECT INSULIN  twice a day 06/06/17   Binnie Rail, MD  gabapentin (NEURONTIN) 100 MG capsule Take 2 capsules (200 mg total) by mouth at bedtime. Patient not taking: Reported on 01/28/2019 09/19/18   Binnie Rail, MD  metFORMIN (GLUCOPHAGE) 1000 MG tablet Take 1 tablet (1,000 mg total) by mouth daily with breakfast. Follow=up appt is due must see provider for 90 day suppy 01/28/19   Binnie Rail, MD    Inpatient Medications: Scheduled Meds: . aspirin      . sodium chloride flush  3 mL Intravenous Once   Continuous Infusions:  PRN Meds:   Allergies:   No Known Allergies  Social History:   Social History   Socioeconomic History  . Marital status: Married    Spouse name: Not on file  . Number of children: 4  . Years of education: Not on file  . Highest education level: Not on file  Occupational History  . Not on  file  Social Needs  . Financial  resource strain: Not on file  . Food insecurity:    Worry: Not on file    Inability: Not on file  . Transportation needs:    Medical: Not on file    Non-medical: Not on file  Tobacco Use  . Smoking status: Never Smoker  . Smokeless tobacco: Never Used  Substance and Sexual Activity  . Alcohol use: Yes    Comment: Occ.   . Drug use: No  . Sexual activity: Not on file  Lifestyle  . Physical activity:    Days per week: Not on file    Minutes per session: Not on file  . Stress: Not on file  Relationships  . Social connections:    Talks on phone: Not on file    Gets together: Not on file    Attends religious service: Not on file    Active member of club or organization: Not on file    Attends meetings of clubs or organizations: Not on file    Relationship status: Not on file  . Intimate partner violence:    Fear of current or ex partner: Not on file    Emotionally abused: Not on file    Physically abused: Not on file    Forced sexual activity: Not on file  Other Topics Concern  . Not on file  Social History Narrative   Lives in [place], [place], has family and friends in [place].    Family History:    Family History  Problem Relation Age of Onset  . Diabetes Father   . Stomach cancer Neg Hx   . Colon cancer Neg Hx   . Rectal cancer Neg Hx   . Esophageal cancer Neg Hx   . Pancreatic cancer Neg Hx      ROS:  Please see the history of present illness.  General:no colds or fevers, no weight changes Skin:no rashes or ulcers HEENT:no blurred vision, no congestion CV:see HPI PUL:see HPI GI:no diarrhea constipation or melena, no indigestion GU:no hematuria, no dysuria MS:no joint pain, no claudication Neuro:no syncope, no lightheadedness Endo:+ diabetes, no thyroid disease  All other ROS reviewed and negative.     Physical Exam/Data:  General:the patient appears uncomfortable.  Respiratory pattern is stable.  No acute distress. Skin:  does not reveal any evidence of cyanosis. HEENT exam: No scleral icterus is noted NECK exam: Revealed moderate JVD.  No carotid bruits are heard. Chest: Diminished basilar breath sounds. Cardiac exam: No gallop is heard.  The rhythm is irregular.  A 2/6 systolic murmur is heard at the apex. Abdomen: Soft with normal bowel sounds.  No tenderness is noted. Extremities: 2+ bilateral lower extremity edema Neurological exam: Unremarkable. Psychological exam: The patient is anxious   Vitals:   01/28/19 1210 01/28/19 1215 01/28/19 1230 01/28/19 1245  BP:  135/71 124/62 128/64  Pulse:  70 72 80  Resp:  17 (!) 23 (!) 21  Temp:      SpO2:  93% 95% 94%  Weight: 110.7 kg     Height: 6' (1.829 m)      No intake or output data in the 24 hours ending 01/28/19 1328 Last 3 Weights 01/28/2019 01/10/2019 11/09/2018  Weight (lbs) 244 lb 248 lb 248 lb 12.8 oz  Weight (kg) 110.678 kg 112.492 kg 112.855 kg     Body mass index is 33.09 kg/m.     Relevant CV Studies: Echo 10/26/18   IMPRESSIONS    1. The left ventricle has mild-moderately reduced systolic  function, with an ejection fraction of 40-45%. The cavity size was severely dilated. Left ventricular diastolic Doppler parameters are consistent with pseudonormalization Elevated left  ventricular end-diastolic pressure.  2. Poor images preclude accurate assessment of wall motion even with definity.  3. The right ventricle has normal systolic function. The cavity was normal. There is no increase in right ventricular wall thickness.  4. Left atrial size was moderately dilated.  5. The mitral valve is normal in structure. Mitral valve regurgitation is moderate by color flow Doppler.  6. The tricuspid valve is normal in structure.  7. The aortic valve is tricuspid.  8. The pulmonic valve was normal in structure.  9. There is mild dilatation of the ascending aorta measuring 38 mm. 10. Right atrial pressure is estimated at 3 mmHg.  FINDINGS  Left  Ventricle: The left ventricle has mild-moderately reduced systolic function, with an ejection fraction of 40-45%. The cavity size was severely dilated. There is no increase in left ventricular wall thickness. Left ventricular diastolic Doppler  parameters are consistent with pseudonormalization Elevated left ventricular end-diastolic pressure Definity contrast agent was given IV to delineate the left ventricular endocardial borders. Poor images preclude accurate assessment of wall motion even  with definity. Right Ventricle: The right ventricle has normal systolic function. The cavity was normal. There is no increase in right ventricular wall thickness. Left Atrium: left atrial size was moderately dilated Right Atrium: right atrial size was normal in size Right atrial pressure is estimated at 3 mmHg. Interatrial Septum: No atrial level shunt detected by color flow Doppler. Pericardium: There is no evidence of pericardial effusion. Mitral Valve: The mitral valve is normal in structure. Mitral valve regurgitation is moderate by color flow Doppler. Tricuspid Valve: The tricuspid valve is normal in structure. Tricuspid valve regurgitation is mild by color flow Doppler. Aortic Valve: The aortic valve is tricuspid Aortic valve regurgitation was not visualized by color flow Doppler. Pulmonic Valve: The pulmonic valve was normal in structure. Pulmonic valve regurgitation is trivial by color flow Doppler. Aorta: There is mild dilatation of the ascending aorta measuring 38 mm. Venous: The inferior vena cava is normal in size with greater than 50% respiratory variability.    Cardiac cath 10/26/18   Mid LM to Dist LM lesion is 95% stenosed.  Prox LAD lesion is 100% stenosed.  Prox Cx lesion is 100% stenosed.  Prox RCA to Dist RCA lesion is 100% stenosed.  LV end diastolic pressure is normal.  Hemodynamic findings consistent with mild pulmonary hypertension.   1. Occlusive native vessel CAD.      -95% distal left main    - 100% proximal LAD after the first septal perforator    - 100% LCx after a very small OM1.    - 100% proximal RCA 2. Patent LIMA to the LAD 3. Patent free radial graft arising from the mid LIMA graft and supplying the first diagonal and OM 4. SVG to RCA is known to be occluded. The distal RCA is supplied by collaterals.  5. Normal LV filling pressures 6. Mild pulmonary HTN. 7. Preserved cardiac output.  Plan: continue medical therapy for CHF. Will switch IV lasix to po. Stop IV Ntg and heparin.   Laboratory Data:  Chemistry Recent Labs  Lab 01/28/19 0923  NA 135  K 4.3  CL 102  CO2 20*  GLUCOSE 265*  BUN 36*  CREATININE 1.78*  CALCIUM 9.3  GFRNONAA 38*  GFRAA 44*  ANIONGAP 13    No results for  input(s): PROT, ALBUMIN, AST, ALT, ALKPHOS, BILITOT in the last 168 hours. Hematology Recent Labs  Lab 01/28/19 0923  WBC 11.1*  RBC 3.51*  HGB 10.6*  HCT 32.0*  MCV 91.2  MCH 30.2  MCHC 33.1  RDW 14.6  PLT 159   Cardiac Enzymes Recent Labs  Lab 01/28/19 0923  TROPONINI 0.76*   No results for input(s): TROPIPOC in the last 168 hours.  BNPNo results for input(s): BNP, PROBNP in the last 168 hours.  DDimer No results for input(s): DDIMER in the last 168 hours.  Radiology/Studies:  Dg Chest 2 View  Result Date: 01/28/2019 CLINICAL DATA:  Chest pain EXAM: CHEST - 2 VIEW COMPARISON:  October 25, 2018 FINDINGS: There is no edema or consolidation. Heart is mildly enlarged with pulmonary vascularity normal. No adenopathy. Patient is status post coronary artery bypass grafting. There is mild degenerative change in the thoracic spine. IMPRESSION: Mild cardiac enlargement. Postoperative change. No edema or consolidation. Electronically Signed   By: Lowella Grip III M.D.   On: 01/28/2019 09:53     Assessment and Plan:   1. Chest pain/ heartburn.  Troponin 0.76  With known CAD and lst cath 10/2018.  No acute changes.  Troponin is elevated but  with GI components as well more difficulty to decide GI Vs cardiac.NTG with brief relief. ? CTA of chest and GI consult.   Has had GI cocktail. IV lasix,  2. CAD with hx of CABG 2006 and last cath 10/2018.  Stable.  Occluded VG to RCA which he has had.   3. Chronic systolic and diastolic HF.  entresto has been added and is on BB has not had any lasix since Friday now with 80 mg IV, NTG paste and dilaudid.   4. DM-2 per primary team.  5. Pt has trouble paying for his meds. 6. Cepheid test for COVID pending      For questions or updates, please contact [place] Please consult [email or web address] for contact info under     Signed, Cecilie Kicks, NP  01/28/2019 1:28 PM

## 2019-01-28 NOTE — Progress Notes (Addendum)
ANTICOAGULATION CONSULT NOTE - Initial Consult  Pharmacy Consult for Heparin  Indication: chest pain/ACS  No Known Allergies  Patient Measurements: Height: 6' (182.9 cm) Weight: 244 lb (110.7 kg) IBW/kg (Calculated) : 77.6 HEPARIN DW (KG): 101.1   Vital Signs: Temp: 99.2 F (37.3 C) (05/18 0911) BP: 129/70 (05/18 1500) Pulse Rate: 66 (05/18 1500)  Labs: Recent Labs    01/28/19 0923  HGB 10.6*  HCT 32.0*  PLT 159  CREATININE 1.78*  TROPONINI 0.76*    Estimated Creatinine Clearance: 51 mL/min (A) (by C-G formula based on SCr of 1.78 mg/dL (H)).   Medical History: Past Medical History:  Diagnosis Date  . BPH (benign prostatic hypertrophy)   . Cardiomyopathy, ischemic   . Coronary artery disease    at [facility], [place] 2009 (GRAFTS PATENT), cath 05/2018 w/ SVG-PDA 100%>>med rx  . Diabetes mellitus, type 2 ([place])   . Frequency of urination   . GERD (gastroesophageal reflux disease)   . Gross hematuria   . History of CHF (congestive heart failure) 2007  . History of kidney stones   . History of myocardial infarction 2006, 2019   2006>>CABG, 2019 cath w/ med rx  . History of urinary retention   . Hyperlipidemia   . Nocturia   . Renal calculi    BILATERAL PER CT SCAN--  NON-OBSTRUCTIVE  . S/P CABG x 4 2006    Assessment: Pt is a 80yoM with a complex CAD history (CABG 2006 LIMA-LAD; L radial to OM-D1, and VG to PDA and then NSTEMI 10/2018). Initial troponin 0.76. Pharmacy consulted to start heparin.  Goal of Therapy:  Heparin level 0.3-0.7 units/ml Monitor platelets by anticoagulation protocol: Yes   Plan:  Give 4000 units bolus x 1 Start heparin infusion at 1200 units/hr Check anti-Xa level in 6 hours and daily while on heparin Continue to monitor H&H and platelets   Claiborne Billings, PharmD PGY2 Cardiology Pharmacy Resident Please check AMION for all Pharmacist numbers by unit 01/28/2019 5:00 PM

## 2019-01-28 NOTE — ED Notes (Signed)
Pt still belching and states is till in pain, dr Wilson Singer made aware

## 2019-01-28 NOTE — ED Triage Notes (Signed)
Cp started last night about 5 pm  Feels like heartburn  Has taken 6 nitro has been gagging and burping he states

## 2019-01-28 NOTE — ED Provider Notes (Signed)
[facility] EMERGENCY DEPARTMENT Provider Note   CSN: [id number] Arrival date & time: 01/28/19  0762    History   Chief Complaint Chief Complaint  Patient presents with  . Chest Pain    HPI Russell Trevino is a 68 y.o. male.  HPI   40yM with CP. Substernal. Ongoing for months. Recently admitted for CP 10/2018 and underwent Cardiac Catheterization showing mid LM to distal LM lesion that is 95% stenosed, proximal LAD lesion that is 100% stenosed, proximal circumflex lesion is 100% stenosed, and proximal RCA to distal RCA lesion is 100% stenosed. Cardiology recommended medical therapy for CHF. There was no intervention from the cath and he was medically managed. He was diuresed and improved.  He describes the pain as both sharp and burning. Associated with frequent belching and gagging. Pain exacerbated by eating and activity. Also worse at night and interfering with sleep. Nitroglycerin provides some relief. Has been compliant with medications aside from not taking lasix and brilinta for several days because he ran out. He denies any fever or chills.   Past Medical History:  Diagnosis Date  . BPH (benign prostatic hypertrophy)   . Cardiomyopathy, ischemic   . Coronary artery disease    at [facility], [place] 2009 (GRAFTS PATENT), cath 05/2018 w/ SVG-PDA 100%>>med rx  . Diabetes mellitus, type 2 ([place])   . Frequency of urination   . Gross hematuria   . History of CHF (congestive heart failure) 2007  . History of kidney stones   . History of myocardial infarction 2006, 2019   2006>>CABG, 2019 cath w/ med rx  . History of urinary retention   . Hyperlipidemia   . Nocturia   . Renal calculi    BILATERAL PER CT SCAN--  NON-OBSTRUCTIVE  . S/P CABG x 4 2006    Patient Active Problem List   Diagnosis Date Noted  . Ischemic cardiomyopathy 11/05/2018  . Chronic combined systolic and diastolic heart failure ([place]) 11/05/2018  . Type 2 diabetes mellitus with stage 3  chronic kidney disease, with long-term current use of insulin ([place]) 08/26/2017  . GERD (gastroesophageal reflux disease) 07/13/2017  . Cough 08/17/2016  . Thoracic back pain 06/21/2016  . Hx of CABG 05/26/2016  . Prostate cancer ([place]) 05/26/2016  . Essential hypertension, benign 05/26/2016  . Mixed hyperlipidemia 05/26/2016  . Cardiomyopathy ([place]) 03/25/2013  . Coronary artery disease due to calcified coronary lesion 03/25/2013    Past Surgical History:  Procedure Laterality Date  . CARDIAC CATHETERIZATION  01/17/2008   GRAFTS PATENT / EF 40%,  DR SANTOS ([facility])  . CORONARY ARTERY BYPASS GRAFT  04/2005   LIMA-LAD, L radial-OM-D1 and SVG-PDA  . CYSTO / RIGHT URETERAL STENT PLACEMENT  10-08-1999  . CYSTO/  REMOVAL BLADDER STONES  01-14-2010  . CYSTOSCOPY  10/16/2012   Procedure: CYSTOSCOPY;  Surgeon: Malka So, MD;  Location: [facility];  Service: Urology;  Laterality: N/A;  Cystoscopy with Clot Evacuation and fulguration bladder neck.  Marland Kitchen EXTRACORPOREAL SHOCK WAVE LITHOTRIPSY  10-15-2009   RIGHT  . KNEE ARTHROSCOPY  1978   RIGHT  . LEFT SHOULDER SURGERY   2002  . LUMBAR FUSION  09-10-2008   L4 - L5  . PERCUTANEOUS NEPHROSTOLITHOTOMY  1979  . PROSTATE BIOPSY  10/16/2012   Procedure: PROSTATE BIOPSY;  Surgeon: Malka So, MD;  Location: [facility];  Service: Urology;  Laterality: N/A;  Through ultrasound.  Marland Kitchen RIGHT/LEFT HEART CATH AND CORONARY/GRAFT ANGIOGRAPHY N/A 10/26/2018  Procedure: RIGHT/LEFT HEART CATH AND CORONARY/GRAFT ANGIOGRAPHY;  Surgeon: Martinique, Peter M, MD;  Location: [facility];  Service: Cardiovascular;  Laterality: N/A;  . SHOULDER ARTHROSCOPY W/ SUBACROMIAL DECOMPRESSION AND DISTAL CLAVICLE EXCISION  10-11-2004   AND PARTIAL ROTATOR CUFF REPAIR  . TRANSTHORACIC ECHOCARDIOGRAM  29/93/7169   LV SYSTOLIC FUNCTION MILDLY REDUCED/ EF 48%/ LV FILLING PATTERN  IS IMPAIRED/ HYPOKINESIS IN THE MID AND BASALAR SEPTUM & ANTEROSEPTUM   . TURP VAPORIZATION  01-19-2010   BPH W/ BOO  . URETEROLITHOTOMY  1976        Home Medications    Prior to Admission medications   Medication Sig Start Date End Date Taking? Authorizing Provider  amLODipine (NORVASC) 5 MG tablet Take 1 tablet (5 mg total) by mouth daily. Patient taking differently: Take 5 mg by mouth daily at 12 noon.  09/19/18   Binnie Rail, MD  aspirin 81 MG tablet Take 81 mg by mouth daily at 12 noon.     [provider]  atorvastatin (LIPITOR) 80 MG tablet Take 1 tablet (80 mg total) by mouth daily. Patient taking differently: Take 80 mg by mouth daily at 12 noon.  09/19/18   Binnie Rail, MD  BD VEO INSULIN SYR ULTRAFINE 31G X 15/64" 1 ML MISC use as directed TO INJECT INSULIN  twice a day 06/06/17   Binnie Rail, MD  BRILINTA 90 MG TABS tablet Take 1 tablet (90 mg total) by mouth 2 (two) times daily. 01/21/19   Buford Dresser, MD  finasteride (PROSCAR) 5 MG tablet Take 1 tablet (5 mg total) by mouth daily. Patient taking differently: Take 5 mg by mouth daily at 12 noon. 12 noon 09/19/18   Binnie Rail, MD  furosemide (LASIX) 80 MG tablet Take 1 tablet (80 mg total) by mouth daily. 10/28/18   Raiford Noble Latif, DO  gabapentin (NEURONTIN) 100 MG capsule Take 2 capsules (200 mg total) by mouth at bedtime. Patient taking differently: Take 100 mg by mouth daily at 12 noon. 12 noon 09/19/18   Binnie Rail, MD  insulin NPH-regular Human (HUMULIN 70/30) (70-30) 100 UNIT/ML injection 90 units with breakfast, and 55 units with supper Patient taking differently: Inject 40-70 Units into the skin See admin instructions. 70 mg in the morning and 40 mg in the evening 11/03/17   Renato Shin, MD  metFORMIN (GLUCOPHAGE) 1000 MG tablet Take 1 tablet (1,000 mg total) by mouth 2 (two) times daily with a meal. Patient taking differently: Take 1,000 mg by mouth daily at 12 noon. 12 noon 07/14/17   Binnie Rail, MD  metoprolol succinate (TOPROL-XL) 50 MG 24 hr tablet  Take 1 tablet (50 mg total) by mouth daily. Take with or immediately following a meal. 10/28/18   Sheikh, Omair Latif, DO  nitroGLYCERIN (NITROSTAT) 0.4 MG SL tablet DISSOLVE 1 TABLET UNDER THE TONGUE EVERY 5 MINUTES AS NEEDED FOR CHEST PAIN. CALL MD/911 IF TAKE 2 DOSES. MAX 3 DOSES/DAY 01/21/19   Binnie Rail, MD  potassium chloride SA (K-DUR) 20 MEQ tablet Take 1 tablet (20 mEq total) by mouth daily. 01/21/19   Buford Dresser, MD  sacubitril-valsartan (ENTRESTO) 49-51 MG Take 1 tablet by mouth 2 (two) times daily. 11/05/18   Buford Dresser, MD  sildenafil (REVATIO) 20 MG tablet Take 1 tablet (20 mg total) by mouth as needed. 12/18/18   Burns, Claudina Lick, MD  sucralfate (CARAFATE) 1 g tablet TAKE 1 TABLET(1 GRAM) BY MOUTH FOUR TIMES DAILY AT  BEDTIME WITH MEALS Patient taking differently: Take 1 g by mouth 3 (three) times daily. Last dose at bedtime 10/22/18   Binnie Rail, MD    Family History Family History  Problem Relation Age of Onset  . Diabetes Father   . Stomach cancer Neg Hx   . Colon cancer Neg Hx   . Rectal cancer Neg Hx   . Esophageal cancer Neg Hx   . Pancreatic cancer Neg Hx     Social History Social History   Tobacco Use  . Smoking status: Never Smoker  . Smokeless tobacco: Never Used  Substance Use Topics  . Alcohol use: Yes    Comment: Occ.   . Drug use: No     Allergies   Patient has no known allergies.   Review of Systems Review of Systems  All systems reviewed and negative, other than as noted in HPI.  Physical Exam Updated Vital Signs BP (!) 146/79 (BP Location: Right Arm)   Pulse 83   Temp 99.2 F (37.3 C)   Resp 20   SpO2 98%   Physical Exam Vitals signs and nursing note reviewed.  Constitutional:      General: He is not in acute distress.    Appearance: He is well-developed.  HENT:     Head: Normocephalic and atraumatic.  Eyes:     General:        Right eye: No discharge.        Left eye: No discharge.      Conjunctiva/sclera: Conjunctivae normal.  Neck:     Musculoskeletal: Neck supple.  Cardiovascular:     Rate and Rhythm: Normal rate and regular rhythm.     Heart sounds: Normal heart sounds. No murmur. No friction rub. No gallop.   Pulmonary:     Effort: Pulmonary effort is normal. No respiratory distress.     Breath sounds: Normal breath sounds.  Abdominal:     General: There is no distension.     Palpations: Abdomen is soft.     Tenderness: There is no abdominal tenderness.  Musculoskeletal:        General: No tenderness.  Skin:    General: Skin is warm and dry.  Neurological:     Mental Status: He is alert.  Psychiatric:        Behavior: Behavior normal.        Thought Content: Thought content normal.      ED Treatments / Results  Labs (all labs ordered are listed, but only abnormal results are displayed) Labs Reviewed  BASIC METABOLIC PANEL - Abnormal; Notable for the following components:      Result Value   CO2 20 (*)    Glucose, Bld 265 (*)    BUN 36 (*)    Creatinine, Ser 1.78 (*)    GFR calc non Af Amer 38 (*)    GFR calc Af Amer 44 (*)    All other components within normal limits  CBC - Abnormal; Notable for the following components:   WBC 11.1 (*)    RBC 3.51 (*)    Hemoglobin 10.6 (*)    HCT 32.0 (*)    All other components within normal limits  TROPONIN I - Abnormal; Notable for the following components:   Troponin I 0.76 (*)    All other components within normal limits  BRAIN NATRIURETIC PEPTIDE - Abnormal; Notable for the following components:   B Natriuretic Peptide 2,135.0 (*)    All other components within  normal limits  TROPONIN I - Abnormal; Notable for the following components:   Troponin I 1.75 (*)    All other components within normal limits  TROPONIN I - Abnormal; Notable for the following components:   Troponin I 1.80 (*)    All other components within normal limits  TROPONIN I - Abnormal; Notable for the following components:    Troponin I 1.73 (*)    All other components within normal limits  BASIC METABOLIC PANEL - Abnormal; Notable for the following components:   Potassium 5.3 (*)    Glucose, Bld 268 (*)    BUN 39 (*)    Creatinine, Ser 1.89 (*)    GFR calc non Af Amer 36 (*)    GFR calc Af Amer 41 (*)    All other components within normal limits  CBC - Abnormal; Notable for the following components:   RBC 3.25 (*)    Hemoglobin 9.9 (*)    HCT 29.0 (*)    Platelets 134 (*)    All other components within normal limits  HEPATIC FUNCTION PANEL - Abnormal; Notable for the following components:   Total Bilirubin 2.9 (*)    Bilirubin, Direct 0.4 (*)    Indirect Bilirubin 2.5 (*)    All other components within normal limits  TROPONIN I - Abnormal; Notable for the following components:   Troponin I 1.77 (*)    All other components within normal limits  HEMOGLOBIN A1C - Abnormal; Notable for the following components:   Hgb A1c MFr Bld 8.5 (*)    All other components within normal limits  GLUCOSE, CAPILLARY - Abnormal; Notable for the following components:   Glucose-Capillary 220 (*)    All other components within normal limits  HEPARIN LEVEL (UNFRACTIONATED) - Abnormal; Notable for the following components:   Heparin Unfractionated 0.17 (*)    All other components within normal limits  LIPID PANEL - Abnormal; Notable for the following components:   LDL Cholesterol 111 (*)    All other components within normal limits  GLUCOSE, CAPILLARY - Abnormal; Notable for the following components:   Glucose-Capillary 253 (*)    All other components within normal limits  CBG MONITORING, ED - Abnormal; Notable for the following components:   Glucose-Capillary 264 (*)    All other components within normal limits  SARS CORONAVIRUS 2 (HOSPITAL ORDER, [place] [facility])  MRSA PCR SCREENING  LIPASE, BLOOD  HEPARIN LEVEL (UNFRACTIONATED)  MAGNESIUM  TSH  T4, FREE  TROPONIN I  HEPARIN LEVEL  (UNFRACTIONATED)    EKG EKG Interpretation  Date/Time:  Monday Jan 28 2019 09:10:00 EDT Ventricular Rate:  85 PR Interval:    QRS Duration: 142 QT Interval:  427 QTC Calculation: 508 R Axis:   83 Text Interpretation:  Sinus rhythm IVCD, consider atypical RBBB Repol abnrm suggests ischemia, diffuse leads Confirmed by Virgel Manifold [phone]) on 01/28/2019 9:27:09 AM Also confirmed by Virgel Manifold [phone]), editor Hattie Perch [phone])  on 01/28/2019 9:54:35 AM   Radiology Dg Chest 2 View  Result Date: 01/28/2019 CLINICAL DATA:  Chest pain EXAM: CHEST - 2 VIEW COMPARISON:  October 25, 2018 FINDINGS: There is no edema or consolidation. Heart is mildly enlarged with pulmonary vascularity normal. No adenopathy. Patient is status post coronary artery bypass grafting. There is mild degenerative change in the thoracic spine. IMPRESSION: Mild cardiac enlargement. Postoperative change. No edema or consolidation. Electronically Signed   By: Lowella Grip III M.D.   On: 01/28/2019 09:53  Procedures Procedures (including critical care time)  CRITICAL CARE Performed by: Virgel Manifold Total critical care time: 35 minutes Critical care time was exclusive of separately billable procedures and treating other patients. Critical care was necessary to treat or prevent imminent or life-threatening deterioration. Critical care was time spent personally by me on the following activities: development of treatment plan with patient and/or surrogate as well as nursing, discussions with consultants, evaluation of patient's response to treatment, examination of patient, obtaining history from patient or surrogate, ordering and performing treatments and interventions, ordering and review of laboratory studies, ordering and review of radiographic studies, pulse oximetry and re-evaluation of patient's condition.   Medications Ordered in ED Medications - No data to display   Initial Impression /  Assessment and Plan / ED Course  I have reviewed the triage vital signs and the nursing notes.  Pertinent labs & imaging results that were available during my care of the patient were reviewed by me and considered in my medical decision making (see chart for details).  67yM with continued CP. Many of his symptoms seem very consistent with GI process but also some features that are concerning for possible cardiac etiology. Troponin remains elevated. EKG similar to previous though.Will discuss with cardiology.   Final Clinical Impressions(s) / ED Diagnoses   Final diagnoses:  Unstable angina [facility])    ED Discharge Orders    None       Virgel Manifold, MD 01/29/19 [phone]

## 2019-01-28 NOTE — ED Notes (Signed)
Patient staying in contact with family

## 2019-01-28 NOTE — ED Notes (Signed)
Report given to [facility]. All questions answered

## 2019-01-29 DIAGNOSIS — I5042 Chronic combined systolic (congestive) and diastolic (congestive) heart failure: Secondary | ICD-10-CM

## 2019-01-29 DIAGNOSIS — E1122 Type 2 diabetes mellitus with diabetic chronic kidney disease: Secondary | ICD-10-CM

## 2019-01-29 LAB — GLUCOSE, CAPILLARY
Glucose-Capillary: 253 mg/dL — ABNORMAL HIGH (ref 70–99)
Glucose-Capillary: 240 mg/dL — ABNORMAL HIGH (ref 70–99)
Glucose-Capillary: 149 mg/dL — ABNORMAL HIGH (ref 70–99)
Glucose-Capillary: 197 mg/dL — ABNORMAL HIGH (ref 70–99)
Glucose-Capillary: 101 mg/dL — ABNORMAL HIGH (ref 70–99)

## 2019-01-29 LAB — LIPID PANEL
Cholesterol: 177 mg/dL (ref 0–200)
HDL: 45 mg/dL (ref >40)
LDL Cholesterol: 111 mg/dL — ABNORMAL HIGH (ref 0–99)
Total CHOL/HDL Ratio: 3.9 RATIO
Triglycerides: 107 mg/dL (ref <150)
VLDL: 21 mg/dL (ref 0–40)

## 2019-01-29 LAB — BASIC METABOLIC PANEL
Anion gap: 13 (ref 5–15)
BUN: 39 mg/dL — ABNORMAL HIGH (ref 8–23)
CO2: 22 mmol/L (ref 22–32)
Calcium: 9.1 mg/dL (ref 8.9–10.3)
Chloride: 102 mmol/L (ref 98–111)
Creatinine, Ser: 1.89 mg/dL — ABNORMAL HIGH (ref 0.61–1.24)
GFR calc Af Amer: 41 mL/min — ABNORMAL LOW (ref >60)
GFR calc non Af Amer: 36 mL/min — ABNORMAL LOW (ref >60)
Glucose, Bld: 268 mg/dL — ABNORMAL HIGH (ref 70–99)
Potassium: 5.3 mmol/L — ABNORMAL HIGH (ref 3.5–5.1)
Sodium: 137 mmol/L (ref 135–145)

## 2019-01-29 LAB — HEPATIC FUNCTION PANEL
ALT: 26 U/L (ref 0–44)
AST: 25 U/L (ref 15–41)
Albumin: 3.5 g/dL (ref 3.5–5.0)
Alkaline Phosphatase: 68 U/L (ref 38–126)
Bilirubin, Direct: 0.4 mg/dL — ABNORMAL HIGH (ref 0.0–0.2)
Indirect Bilirubin: 2.5 mg/dL — ABNORMAL HIGH (ref 0.3–0.9)
Total Bilirubin: 2.9 mg/dL — ABNORMAL HIGH (ref 0.3–1.2)
Total Protein: 7.1 g/dL (ref 6.5–8.1)

## 2019-01-29 LAB — TROPONIN I
Troponin I: 1.8 ng/mL (ref <0.03)
Troponin I: 1.73 ng/mL (ref <0.03)
Troponin I: 3.12 ng/mL (ref <0.03)

## 2019-01-29 LAB — T4, FREE: Free T4: 1.02 ng/dL (ref 0.82–1.77)

## 2019-01-29 LAB — CBC
HCT: 29 % — ABNORMAL LOW (ref 39.0–52.0)
Hemoglobin: 9.9 g/dL — ABNORMAL LOW (ref 13.0–17.0)
MCH: 30.5 pg (ref 26.0–34.0)
MCHC: 34.1 g/dL (ref 30.0–36.0)
MCV: 89.2 fL (ref 80.0–100.0)
Platelets: 134 10*3/uL — ABNORMAL LOW (ref 150–400)
RBC: 3.25 MIL/uL — ABNORMAL LOW (ref 4.22–5.81)
RDW: 14.7 % (ref 11.5–15.5)
WBC: 9.9 10*3/uL (ref 4.0–10.5)
nRBC: 0 % (ref 0.0–0.2)

## 2019-01-29 LAB — MAGNESIUM: Magnesium: 1.7 mg/dL (ref 1.7–2.4)

## 2019-01-29 LAB — HEPARIN LEVEL (UNFRACTIONATED)
Heparin Unfractionated: 0.35 IU/mL (ref 0.30–0.70)
Heparin Unfractionated: 0.17 IU/mL — ABNORMAL LOW (ref 0.30–0.70)

## 2019-01-29 LAB — TSH: TSH: 3.549 u[IU]/mL (ref 0.350–4.500)

## 2019-01-29 MED ORDER — ENOXAPARIN SODIUM 60 MG/0.6ML ~~LOC~~ SOLN
50.0000 mg | Freq: Every day | SUBCUTANEOUS | Status: DC
  Administered 2019-01-29: 50 mg via SUBCUTANEOUS
  Filled 2019-01-29: qty 0.6

## 2019-01-29 MED ORDER — ALUM & MAG HYDROXIDE-SIMETH 200-200-20 MG/5ML PO SUSP
30.0000 mL | ORAL | Status: DC | PRN
  Administered 2019-01-29 (×2): 30 mL via ORAL
  Filled 2019-01-29 (×2): qty 30

## 2019-01-29 MED ORDER — FUROSEMIDE 10 MG/ML IJ SOLN
80.0000 mg | Freq: Two times a day (BID) | INTRAMUSCULAR | Status: DC
  Administered 2019-01-29: 80 mg via INTRAVENOUS
  Filled 2019-01-29: qty 8

## 2019-01-29 MED ORDER — INSULIN ASPART PROT & ASPART (70-30 MIX) 100 UNIT/ML ~~LOC~~ SUSP
40.0000 [IU] | Freq: Two times a day (BID) | SUBCUTANEOUS | Status: DC
  Administered 2019-01-29 – 2019-01-30 (×2): 40 [IU] via SUBCUTANEOUS

## 2019-01-29 MED ORDER — NITROGLYCERIN IN D5W 200-5 MCG/ML-% IV SOLN: 0.0000 ug/min | INTRAVENOUS | Status: DC

## 2019-01-29 MED ORDER — SACUBITRIL-VALSARTAN 24-26 MG PO TABS
1.0000 | ORAL_TABLET | Freq: Two times a day (BID) | ORAL | Status: DC
  Administered 2019-01-29 (×2): 1 via ORAL
  Filled 2019-01-29 (×2): qty 1

## 2019-01-29 MED ORDER — ISOSORBIDE MONONITRATE ER 60 MG PO TB24
60.0000 mg | ORAL_TABLET | Freq: Every day | ORAL | Status: DC
  Administered 2019-01-29: 60 mg via ORAL
  Filled 2019-01-29: qty 1

## 2019-01-29 MED ORDER — FUROSEMIDE 80 MG PO TABS: 80.0000 mg | ORAL_TABLET | Freq: Every day | ORAL | Status: DC

## 2019-01-29 NOTE — Progress Notes (Signed)
ANTICOAGULATION CONSULT NOTE - Initial Consult  Pharmacy Consult for Heparin>> lovenox Indication: VTE prophylaxis  No Known Allergies  Patient Measurements: Height: 6' (182.9 cm) Weight: 251 lb 1.7 oz (113.9 kg) IBW/kg (Calculated) : 77.6 HEPARIN DW (KG): 102.2   Vital Signs: Temp: 98.5 F (36.9 C) (05/19 1115) Temp Source: Oral (05/19 1115) BP: 111/63 (05/19 1115) Pulse Rate: 59 (05/19 1115)  Labs: Recent Labs    01/28/19 0923  01/28/19 2326 01/29/19 0541 01/29/19 1119  HGB 10.6*  --   --  9.9*  --   HCT 32.0*  --   --  29.0*  --   PLT 159  --   --  134*  --   HEPARINUNFRC  --   --  0.35 0.17*  --   CREATININE 1.78*  --   --  1.89*  --   TROPONINI 0.76*   < > 1.80* 1.73* 3.12*   < > = values in this interval not displayed.    Estimated Creatinine Clearance: 48.7 mL/min (A) (by C-G formula based on SCr of 1.89 mg/dL (H)).   Medical History: Past Medical History:  Diagnosis Date  . BPH (benign prostatic hypertrophy)   . Cardiomyopathy, ischemic   . Coronary artery disease    at [facility], [place] 2009 (GRAFTS PATENT), cath 05/2018 w/ SVG-PDA 100%>>med rx  . Diabetes mellitus, type 2 ([place])   . Frequency of urination   . GERD (gastroesophageal reflux disease)   . Gross hematuria   . History of CHF (congestive heart failure) 2007  . History of kidney stones   . History of myocardial infarction 2006, 2019   2006>>CABG, 2019 cath w/ med rx  . History of urinary retention   . Hyperlipidemia   . Nocturia   . Renal calculi    BILATERAL PER CT SCAN--  NON-OBSTRUCTIVE  . S/P CABG x 4 2006    Assessment: Pt is a 69yoM with a complex CAD history (CABG 2006 LIMA-LAD; L radial to OM-D1, and VG to PDA and then NSTEMI 10/2018).  Pharmacy consulted to start heparin and plans are to change to lovenox for VTE prophyalxis -Hg= 9.9, plt= 134 -SCr= 1.89, CrCL ~ 50   Goal of Therapy:  Heparin level 0.3-0.7 units/ml Monitor platelets by anticoagulation protocol: Yes   Plan:   -Lovenox 50mg  [place] q24h (~ 0.5mg /kg daily) -Will sign off. Please contact pharmacy with any other needs.  Hildred Laser, PharmD Clinical Pharmacist **Pharmacist phone directory can now be found on [email or web address] ([email or web address]).  Listed under [place].

## 2019-01-29 NOTE — Progress Notes (Addendum)
The patient has been seen in conjunction with Cecilie Kicks, NP. All aspects of care have been considered and discussed. The patient has been personally interviewed, examined, and all clinical data has been reviewed.   Acute on chronic combined systolic and diastolic heart failure: Plan to hold p.m. Entresto.  Will establish diuresis to remove excess volume.  NON-ST elevation myocardial infarction: Slight further increase in troponin although symptomatically improved.  Suspect ischemic territory is related to residual small branches that arise from the left main that are at risk.  This territory is not suitable for PCI.  Chronic CAD: Convert IV nitroglycerin to isosorbide mononitrate.  Discontinue IV heparin and start subcu Lovenox, DVT prophylaxis  CKD stage III: Start furosemide 80 milligrams IV x2 doses.  I do believe the 40 mg IV dose is reached his renal threshold.  Ambulate and consider discharge tomorrow if stable.  ECG done this morning is similar to October 25, 2018 revealing diffuse ST segment depression with interventricular conduction delay.   Progress Note  Due to the COVID-19 pandemic, this visit was completed with telemedicine (audio/video) technology to reduce patient and provider exposure as well as to preserve personal protective equipment.   Patient Name: Russell Trevino Date of Encounter: 01/29/2019  Primary Cardiologist: Buford Dresser, MD   Subjective   Over all the breathing and chest pain have improved, still with belching.  The GI cocktail in ER did not really help.   Inpatient Medications    Scheduled Meds: . amLODipine  5 mg Oral Daily  . aspirin EC  81 mg Oral Q1200  . atorvastatin  80 mg Oral Daily  . finasteride  5 mg Oral Daily  . insulin aspart  0-5 Units Subcutaneous QHS  . insulin aspart  0-9 Units Subcutaneous TID WC  . insulin aspart protamine- aspart  40 Units Subcutaneous BID WC  . metoprolol succinate  50 mg Oral Daily  .  pantoprazole  40 mg Oral BID  . sacubitril-valsartan  1 tablet Oral BID  . sodium chloride flush  3 mL Intravenous Once  . sucralfate  1 g Oral TID  . ticagrelor  90 mg Oral BID   Continuous Infusions: . sodium chloride Stopped (01/28/19 2335)  . heparin 1,400 Units/hr (01/29/19 0921)  . nitroGLYCERIN 15 mcg/min (01/29/19 0919)   PRN Meds: acetaminophen, alum & mag hydroxide-simeth, fentaNYL (SUBLIMAZE) injection, nitroGLYCERIN, ondansetron (ZOFRAN) IV, zolpidem   Vital Signs    Vitals:   01/29/19 0345 01/29/19 0725 01/29/19 0917 01/29/19 1115  BP: 133/65 (!) 143/94  111/63  Pulse:  70 73 (!) 59  Resp: 17 18 (!) 25 13  Temp: 99.1 F (37.3 C) 98.5 F (36.9 C)  98.5 F (36.9 C)  TempSrc: Oral Oral  Oral  SpO2:  95% 96% 98%  Weight: 113.9 kg     Height:        Intake/Output Summary (Last 24 hours) at 01/29/2019 1256 Last data filed at 01/29/2019 1246 Gross per 24 hour  Intake 156.57 ml  Output 1010 ml  Net -853.43 ml   Last 3 Weights 01/29/2019 01/28/2019 01/28/2019  Weight (lbs) 251 lb 1.7 oz 252 lb 3.3 oz 244 lb  Weight (kg) 113.9 kg 114.4 kg 110.678 kg      Telemetry    SR - Personally Reviewed  ECG    SR with LBBB and inf lat ST depression - Personally Reviewed  Physical Exam   VITAL SIGNS:  reviewed GEN:  no acute distress EYES:  No jaundice RESPIRATORY:  Basilar rales are heard CARDIOVASCULAR:  There is 1+ bilateral lower extremity edema, persistent when compared to yesterday.  Moderate JVD is noted. Neurological exam is intact.  Labs    Chemistry Recent Labs  Lab 01/28/19 0923 01/28/19 2326 01/29/19 0541  NA 135  --  137  K 4.3  --  5.3*  CL 102  --  102  CO2 20*  --  22  GLUCOSE 265*  --  268*  BUN 36*  --  39*  CREATININE 1.78*  --  1.89*  CALCIUM 9.3  --  9.1  PROT  --  7.1  --   ALBUMIN  --  3.5  --   AST  --  25  --   ALT  --  26  --   ALKPHOS  --  68  --   BILITOT  --  2.9*  --   GFRNONAA 38*  --  36*  GFRAA 44*  --  41*   ANIONGAP 13  --  13     Hematology Recent Labs  Lab 01/28/19 0923 01/29/19 0541  WBC 11.1* 9.9  RBC 3.51* 3.25*  HGB 10.6* 9.9*  HCT 32.0* 29.0*  MCV 91.2 89.2  MCH 30.2 30.5  MCHC 33.1 34.1  RDW 14.6 14.7  PLT 159 134*    Cardiac Enzymes Recent Labs  Lab 01/28/19 2151 01/28/19 2326 01/29/19 0541 01/29/19 1119  TROPONINI 1.77* 1.80* 1.73* 3.12*   No results for input(s): TROPIPOC in the last 168 hours.   BNP Recent Labs  Lab 01/28/19 1820  BNP 2,135.0*     DDimer No results for input(s): DDIMER in the last 168 hours.   Radiology    Dg Chest 2 View  Result Date: 01/28/2019 CLINICAL DATA:  Chest pain EXAM: CHEST - 2 VIEW COMPARISON:  October 25, 2018 FINDINGS: There is no edema or consolidation. Heart is mildly enlarged with pulmonary vascularity normal. No adenopathy. Patient is status post coronary artery bypass grafting. There is mild degenerative change in the thoracic spine. IMPRESSION: Mild cardiac enlargement. Postoperative change. No edema or consolidation. Electronically Signed   By: Lowella Grip III M.D.   On: 01/28/2019 09:53    Cardiac Studies   Echo 10/26/18   IMPRESSIONS   1. The left ventricle has mild-moderately reduced systolic function, with an ejection fraction of 40-45%. The cavity size was severely dilated. Left ventricular diastolic Doppler parameters are consistent with pseudonormalization Elevated left  ventricular end-diastolic pressure. 2. Poor images preclude accurate assessment of wall motion even with definity. 3. The right ventricle has normal systolic function. The cavity was normal. There is no increase in right ventricular wall thickness. 4. Left atrial size was moderately dilated. 5. The mitral valve is normal in structure. Mitral valve regurgitation is moderate by color flow Doppler. 6. The tricuspid valve is normal in structure. 7. The aortic valve is tricuspid. 8. The pulmonic valve was normal in  structure. 9. There is mild dilatation of the ascending aorta measuring 38 mm. 10. Right atrial pressure is estimated at 3 mmHg.  FINDINGS Left Ventricle: The left ventricle has mild-moderately reduced systolic function, with an ejection fraction of 40-45%. The cavity size was severely dilated. There is no increase in left ventricular wall thickness. Left ventricular diastolic Doppler  parameters are consistent with pseudonormalization Elevated left ventricular end-diastolic pressure Definity contrast agent was given IV to delineate the left ventricular endocardial borders. Poor images preclude accurate assessment of wall motion even  with definity. Right Ventricle: The right ventricle has normal systolic function. The cavity was normal. There is no increase in right ventricular wall thickness. Left Atrium: left atrial size was moderately dilated Right Atrium: right atrial size was normal in size Right atrial pressure is estimated at 3 mmHg. Interatrial Septum: No atrial level shunt detected by color flow Doppler. Pericardium: There is no evidence of pericardial effusion. Mitral Valve: The mitral valve is normal in structure. Mitral valve regurgitation is moderate by color flow Doppler. Tricuspid Valve: The tricuspid valve is normal in structure. Tricuspid valve regurgitation is mild by color flow Doppler. Aortic Valve: The aortic valve is tricuspid Aortic valve regurgitation was not visualized by color flow Doppler. Pulmonic Valve: The pulmonic valve was normal in structure. Pulmonic valve regurgitation is trivial by color flow Doppler. Aorta: There is mild dilatation of the ascending aorta measuring 38 mm. Venous: The inferior vena cava is normal in size with greater than 50% respiratory variability.   Cardiac cath 10/26/18   Mid LM to Dist LM lesion is 95% stenosed.  Prox LAD lesion is 100% stenosed.  Prox Cx lesion is 100% stenosed.  Prox RCA to Dist RCA lesion is 100%  stenosed.  LV end diastolic pressure is normal.  Hemodynamic findings consistent with mild pulmonary hypertension.  1. Occlusive native vessel CAD.  -95% distal left main - 100% proximal LAD after the first septal perforator - 100% LCx after a very small OM1. - 100% proximal RCA 2. Patent LIMA to the LAD 3. Patent free radial graft arising from the mid LIMA graft and supplying the first diagonal and OM 4. SVG to RCA is known to be occluded. The distal RCA is supplied by collaterals.  5. Normal LV filling pressures 6. Mild pulmonary HTN. 7. Preserved cardiac output.  Plan: continue medical therapy for CHF. Will switch IV lasix to po. Stop IV Ntg and heparin.   Laboratory Data:  Chemistry LastLabs     Recent Labs  Lab 01/28/19 0923  NA 135  K 4.3  CL 102  CO2 20*  GLUCOSE 265*  BUN 36*  CREATININE 1.78*  CALCIUM 9.3  GFRNONAA 38*  GFRAA 44*  ANIONGAP 13      LastLabs  No results for input(s): PROT, ALBUMIN, AST, ALT, ALKPHOS, BILITOT in the last 168 hours.   Hematology LastLabs     Recent Labs  Lab 01/28/19 0923  WBC 11.1*  RBC 3.51*  HGB 10.6*  HCT 32.0*  MCV 91.2  MCH 30.2  MCHC 33.1  RDW 14.6  PLT 159     Cardiac Enzymes LastLabs     Recent Labs  Lab 01/28/19 0923  TROPONINI 0.76*      LastLabs  No results for input(s): TROPIPOC in the last 168 hours.    BNP LastLabs  No results for input(s): BNP, PROBNP in the last 168 hours.    DDimer  Elie Confer  No results for input(s): DDIMER in the last 168 hours.    Radiology/Studies:  Dg Chest 2 View  Result Date: 01/28/2019 CLINICAL DATA:  Chest pain EXAM: CHEST - 2 VIEW COMPARISON:  October 25, 2018 FINDINGS: There is no edema or consolidation. Heart is mildly enlarged with pulmonary vascularity normal. No adenopathy. Patient is status post coronary artery bypass grafting. There is mild degenerative change in the thoracic spine. IMPRESSION: Mild cardiac  enlargement. Postoperative change. No edema or consolidation. Electronically Signed   By: Lowella Grip III M.D.   On: 01/28/2019 09:53  Patient Profile     68 y.o. male with a hx of CAD s/p prior CABG X 4 in 2006 and recent NSTEMI in Feb 2020 and ICM with EF 40-45%, last cath 2/14/200 with patent grafts except VG to RCA which is known to be occluded and distal RCA supplied by collaterals, now admitted with recurrent chest pain.   Assessment & Plan    1. Chest pain/ heartburn.  Troponin 0.76; 1.75; 1.77; 1.80; 1.73  now up to 3.12.  Overall pain improved.  With known CAD and last cath 10/2018.  No acute changes.  Troponin is elevated -felt volume overload precipitated chest pain.  did receive some fentanyl this AM 2. CAD with hx of CABG 2006 and last cath 10/2018.  Stable.  Occluded VG to RCA which he has had.    Discontinue IV heparin and nitroglycerin and convert to subcu and p.o. routes. 3. Chronic systolic and diastolic HF.  entresto has been added and is on BB has not had any lasix for 2 weeks now with 80 mg IV, NTG paste and dilaudid.  neg 93 cc yesterday and total of 513 neg since admit.  Wt down 2 lbs. (BNP 2135) lasix stopped Lasix 80 mg IV x2 doses 4. DM-2 have him on lower dose of his normal insulin, have asked diabetes coordinator for consult  And SSI. 5. Pt has trouble paying for his meds. 6. CKD-3 with Cr up from 1.78 to 1.89.  Has not had significant diuresis.  IV Lasix ordered 7. Hyperkalemia will hold K+  8. HLD with LDL of 111, t chol 177, HDL 45 on lipitor 80 mg.  Will need PCSK9 therapy Cepheid test for COVID neg.   Please see annotated additions to the note in bold.  For questions or updates, please contact [facility] Please consult [email or web address] for contact info under        Signed, Sinclair Grooms, MD  01/29/2019, 12:56 PM

## 2019-01-29 NOTE — Progress Notes (Addendum)
ANTICOAGULATION CONSULT NOTE - Initial Consult  Pharmacy Consult for Heparin  Indication: chest pain/ACS  No Known Allergies  Patient Measurements: Height: 6' (182.9 cm) Weight: 252 lb 3.3 oz (114.4 kg) IBW/kg (Calculated) : 77.6 HEPARIN DW (KG): 102.2   Vital Signs: Temp: 99.5 F (37.5 C) (05/18 2330) Temp Source: Oral (05/18 2330) BP: 133/79 (05/18 2330) Pulse Rate: 74 (05/18 2330)  Labs: Recent Labs    01/28/19 0923 01/28/19 1820 01/28/19 2151 01/28/19 2326  HGB 10.6*  --   --   --   HCT 32.0*  --   --   --   PLT 159  --   --   --   HEPARINUNFRC  --   --   --  0.35  CREATININE 1.78*  --   --   --   TROPONINI 0.76* 1.75* 1.77*  --     Estimated Creatinine Clearance: 51.9 mL/min (A) (by C-G formula based on SCr of 1.78 mg/dL (H)).   Medical History: Past Medical History:  Diagnosis Date  . BPH (benign prostatic hypertrophy)   . Cardiomyopathy, ischemic   . Coronary artery disease    at [facility], [place] 2009 (GRAFTS PATENT), cath 05/2018 w/ SVG-PDA 100%>>med rx  . Diabetes mellitus, type 2 ([place])   . Frequency of urination   . GERD (gastroesophageal reflux disease)   . Gross hematuria   . History of CHF (congestive heart failure) 2007  . History of kidney stones   . History of myocardial infarction 2006, 2019   2006>>CABG, 2019 cath w/ med rx  . History of urinary retention   . Hyperlipidemia   . Nocturia   . Renal calculi    BILATERAL PER CT SCAN--  NON-OBSTRUCTIVE  . S/P CABG x 4 2006    Assessment: Pt is a 89yoM with a complex CAD history (CABG 2006 LIMA-LAD; L radial to OM-D1, and VG to PDA and then NSTEMI 10/2018). Initial troponin 0.76. Pharmacy consulted to start heparin.  Initial heparin level therapeutic at 0.35  Goal of Therapy:  Heparin level 0.3-0.7 units/ml Monitor platelets by anticoagulation protocol: Yes   Plan:  Continue heparin gtt at 1200 units/hr AM labs to confirm  Addendum: AM heparin level 0.17 - confirmed rate and no line  issues with RN Increase heparin gtt to 1400 units/hr F/u 6 hour heparin level  Bertis Ruddy, PharmD Clinical Pharmacist Please check AMION for all [place] numbers 01/29/2019 12:49 AM

## 2019-01-30 DIAGNOSIS — Z794 Long term (current) use of insulin: Secondary | ICD-10-CM

## 2019-01-30 LAB — BASIC METABOLIC PANEL
Anion gap: 11 (ref 5–15)
BUN: 37 mg/dL — ABNORMAL HIGH (ref 8–23)
CO2: 22 mmol/L (ref 22–32)
Calcium: 8.6 mg/dL — ABNORMAL LOW (ref 8.9–10.3)
Chloride: 103 mmol/L (ref 98–111)
Creatinine, Ser: 1.75 mg/dL — ABNORMAL HIGH (ref 0.61–1.24)
GFR calc Af Amer: 45 mL/min — ABNORMAL LOW (ref >60)
GFR calc non Af Amer: 39 mL/min — ABNORMAL LOW (ref >60)
Glucose, Bld: 67 mg/dL — ABNORMAL LOW (ref 70–99)
Potassium: 3.7 mmol/L (ref 3.5–5.1)
Sodium: 136 mmol/L (ref 135–145)

## 2019-01-30 LAB — CBC
HCT: 27 % — ABNORMAL LOW (ref 39.0–52.0)
Hemoglobin: 9.3 g/dL — ABNORMAL LOW (ref 13.0–17.0)
MCH: 30.7 pg (ref 26.0–34.0)
MCHC: 34.4 g/dL (ref 30.0–36.0)
MCV: 89.1 fL (ref 80.0–100.0)
Platelets: 146 10*3/uL — ABNORMAL LOW (ref 150–400)
RBC: 3.03 MIL/uL — ABNORMAL LOW (ref 4.22–5.81)
RDW: 14.5 % (ref 11.5–15.5)
WBC: 9.8 10*3/uL (ref 4.0–10.5)
nRBC: 0 % (ref 0.0–0.2)

## 2019-01-30 LAB — GLUCOSE, CAPILLARY
Glucose-Capillary: 69 mg/dL — ABNORMAL LOW (ref 70–99)
Glucose-Capillary: 188 mg/dL — ABNORMAL HIGH (ref 70–99)
Glucose-Capillary: 155 mg/dL — ABNORMAL HIGH (ref 70–99)

## 2019-01-30 LAB — BRAIN NATRIURETIC PEPTIDE: B Natriuretic Peptide: 686 pg/mL — ABNORMAL HIGH (ref 0.0–100.0)

## 2019-01-30 MED ORDER — METFORMIN HCL 1000 MG PO TABS: 1000.0000 mg | ORAL_TABLET | Freq: Every day | ORAL | 0 refills | Status: DC

## 2019-01-30 MED ORDER — SACUBITRIL-VALSARTAN 24-26 MG PO TABS: 2.0000 | ORAL_TABLET | Freq: Two times a day (BID) | ORAL | Status: DC

## 2019-01-30 MED ORDER — FUROSEMIDE 80 MG PO TABS: 80.0000 mg | ORAL_TABLET | Freq: Every day | ORAL | Status: DC

## 2019-01-30 MED ORDER — ISOSORBIDE MONONITRATE ER 30 MG PO TB24
30.0000 mg | ORAL_TABLET | Freq: Every day | ORAL | Status: DC
  Administered 2019-01-30: 30 mg via ORAL
  Filled 2019-01-30: qty 1

## 2019-01-30 MED ORDER — SACUBITRIL-VALSARTAN 49-51 MG PO TABS
1.0000 | ORAL_TABLET | Freq: Two times a day (BID) | ORAL | Status: DC
  Administered 2019-01-30: 1 via ORAL
  Filled 2019-01-30 (×2): qty 1

## 2019-01-30 MED ORDER — ISOSORBIDE MONONITRATE ER 30 MG PO TB24: 30.0000 mg | ORAL_TABLET | Freq: Every day | ORAL | 3 refills | Status: DC

## 2019-01-30 MED FILL — metFORMIN HCL 1000 MG TABS: 1000 | 30 days supply | Qty: 30 | Fill #0 | Status: TO

## 2019-01-30 MED FILL — ISOSORBIDE MN ER 30 MG TAB: 30 | 30 days supply | Qty: 30 | Fill #0 | Status: TO

## 2019-01-30 NOTE — Progress Notes (Signed)
Patient provided with verbal discharge instructions. Paper copy given to patient. Patient medication brought to bedside by pharmacy. Pt follow-up appointments made and address with patient. Patient had no further questions. Patient belongings sent with patient. VSS at discharge. IV removed per orders. Patient discharged via wheelchair by RN through [employer] entrance.

## 2019-01-30 NOTE — Progress Notes (Signed)
Virtual Visit via Telephone Note   This visit type was conducted due to national recommendations for restrictions regarding the COVID-19 Pandemic (e.g. social distancing) in an effort to limit this patient's exposure and mitigate transmission in our community.  Due to his co-morbid illnesses, this patient is at least at moderate risk for complications without adequate follow up.  This format is felt to be most appropriate for this patient at this time.  The patient did not have access to video technology/had technical difficulties with video requiring transitioning to audio format only (telephone).  All issues noted in this document were discussed and addressed.  No physical exam could be performed with this format.  Please refer to the patient's chart for his  consent to telehealth for [facility].   Date:  01/31/2019   ID:  [name], DOB 1951-06-04, MRN [id number]  Patient Location: Home Provider Location: Home  PCP:  Binnie Rail, MD  Cardiologist:  Buford Dresser, MD  Electrophysiologist:  None   Evaluation Performed:  Follow-Up Visit  Chief Complaint:  Post hospitalization CHF Decompensation   History of Present Illness:    Russell Trevino is a 68 y.o. male with recent discharge from [facility] on 01/30/2019 after admission for unstable angina and acute on chronic combined CHF.He was diuresed with IV lasix to a weight of 246 lbs. He has a history of CABG in 2006 with most recent cardiac cath 10/2018 with stable disease and known occluded SVG to RCA. He remains on DAPT with ASA and Brilinta. He is also on high dose statin but is to be considered for PCSK9 on follow up.  He is very upset that he had to be hospitalized. He apparently did not have any refills on his medications and despite repeated attempts to have our office refill medications they were not sent in. This led to his hospitalization. He is feeling much better now, but still does not have any refills on the  medications when he was discharged.   He states that his breathing and edema are remarkably improved. His only complaint was about his medications. He apparently has two pharmacies, one in [facility] and one in [place].   The patient does not have symptoms concerning for COVID-19 infection (fever, chills, cough, or new shortness of breath).    Past Medical History:  Diagnosis Date  . BPH (benign prostatic hypertrophy)   . Cardiomyopathy, ischemic   . Coronary artery disease    at [facility] [place] 2009 (GRAFTS PATENT), cath 05/2018 w/ SVG-PDA 100%>>med rx  . Diabetes mellitus, type 2 ([place])   . Frequency of urination   . GERD (gastroesophageal reflux disease)   . Gross hematuria   . History of CHF (congestive heart failure) 2007  . History of kidney stones   . History of myocardial infarction 2006, 2019   2006>>CABG, 2019 cath w/ med rx  . History of urinary retention   . Hyperlipidemia   . Nocturia   . Renal calculi    BILATERAL PER CT SCAN--  NON-OBSTRUCTIVE  . S/P CABG x 4 2006   Past Surgical History:  Procedure Laterality Date  . CARDIAC CATHETERIZATION  01/17/2008   GRAFTS PATENT / EF 40%,  DR SANTOS (BAPTIST)  . CORONARY ARTERY BYPASS GRAFT  04/2005   LIMA-LAD, L radial-OM-D1 and SVG-PDA  . CYSTO / RIGHT URETERAL STENT PLACEMENT  10-08-1999  . CYSTO/  REMOVAL BLADDER STONES  01-14-2010  . CYSTOSCOPY  10/16/2012   Procedure: CYSTOSCOPY;  Surgeon:  Malka So, MD;  Location: [facility];  Service: Urology;  Laterality: N/A;  Cystoscopy with Clot Evacuation and fulguration bladder neck.  Marland Kitchen EXTRACORPOREAL SHOCK WAVE LITHOTRIPSY  10-15-2009   RIGHT  . KNEE ARTHROSCOPY  1978   RIGHT  . LEFT SHOULDER SURGERY   2002  . LUMBAR FUSION  09-10-2008   L4 - L5  . PERCUTANEOUS NEPHROSTOLITHOTOMY  1979  . PROSTATE BIOPSY  10/16/2012   Procedure: PROSTATE BIOPSY;  Surgeon: Malka So, MD;  Location: [facility];  Service: Urology;  Laterality: N/A;  Through  ultrasound.  Marland Kitchen RIGHT/LEFT HEART CATH AND CORONARY/GRAFT ANGIOGRAPHY N/A 10/26/2018   Procedure: RIGHT/LEFT HEART CATH AND CORONARY/GRAFT ANGIOGRAPHY;  Surgeon: Martinique, Peter M, MD;  Location: [place] [facility];  Service: Cardiovascular;  Laterality: N/A;  . SHOULDER ARTHROSCOPY W/ SUBACROMIAL DECOMPRESSION AND DISTAL CLAVICLE EXCISION  10-11-2004   AND PARTIAL ROTATOR CUFF REPAIR  . TRANSTHORACIC ECHOCARDIOGRAM  04/88/8916   LV SYSTOLIC FUNCTION MILDLY REDUCED/ EF 48%/ LV FILLING PATTERN  IS IMPAIRED/ HYPOKINESIS IN THE MID AND BASALAR SEPTUM & ANTEROSEPTUM  . TURP VAPORIZATION  01-19-2010   BPH W/ BOO  . URETEROLITHOTOMY  1976     Current Meds  Medication Sig  . aspirin 81 MG tablet Take 81 mg by mouth daily at 12 noon.   Marland Kitchen atorvastatin (LIPITOR) 80 MG tablet Take 1 tablet (80 mg total) by mouth at bedtime.  . BD VEO INSULIN SYR ULTRAFINE 31G X 15/64" 1 ML MISC use as directed TO INJECT INSULIN  twice a day  . BRILINTA 90 MG TABS tablet Take 1 tablet (90 mg total) by mouth 2 (two) times daily.  . finasteride (PROSCAR) 5 MG tablet Take 1 tablet (5 mg total) by mouth daily. (Patient taking differently: Take 5 mg by mouth daily at 12 noon. 12 noon)  . furosemide (LASIX) 80 MG tablet Take 1 tablet (80 mg total) by mouth daily. TAKE ON EMPTY STOMACH  . insulin NPH-regular Human (HUMULIN 70/30) (70-30) 100 UNIT/ML injection 90 units with breakfast, and 55 units with supper (Patient taking differently: Inject 40-70 Units into the skin See admin instructions. 70 mg in the morning and 40 mg in the evening)  . isosorbide mononitrate (IMDUR) 30 MG 24 hr tablet Take 1 tablet (30 mg total) by mouth daily.  . metFORMIN (GLUCOPHAGE) 1000 MG tablet Take 1 tablet (1,000 mg total) by mouth daily with breakfast. Follow=up appt is due must see provider for 90 day suppy  . metoprolol succinate (TOPROL-XL) 50 MG 24 hr tablet Take 1 tablet (50 mg total) by mouth daily at 12 noon. Take with or immediately following  a meal.  . nitroGLYCERIN (NITROSTAT) 0.4 MG SL tablet DISSOLVE 1 TABLET UNDER THE TONGUE EVERY 5 MINUTES AS NEEDED FOR CHEST PAIN. CALL MD/911 IF TAKE 2 DOSES. MAX 3 DOSES/DAY  . pantoprazole (PROTONIX) 40 MG tablet Take 1 tablet (40 mg total) by mouth 2 (two) times a day.  . potassium chloride SA (K-DUR) 20 MEQ tablet Take 1 tablet (20 mEq total) by mouth daily.  . sacubitril-valsartan (ENTRESTO) 49-51 MG Take 1 tablet by mouth 2 (two) times daily.  . sildenafil (REVATIO) 20 MG tablet Take 1 tablet (20 mg total) by mouth as needed. (Patient taking differently: Take 20 mg by mouth as needed (erectile dysfunction). )  . sucralfate (CARAFATE) 1 g tablet TAKE 1 TABLET(1 GRAM) BY MOUTH FOUR TIMES DAILY AT BEDTIME WITH MEALS (Patient taking differently: Take 1  g by mouth 3 (three) times daily. Last dose at bedtime)  . [DISCONTINUED] atorvastatin (LIPITOR) 80 MG tablet Take 1 tablet (80 mg total) by mouth daily. (Patient taking differently: Take 80 mg by mouth daily at 12 noon. )  . [DISCONTINUED] BRILINTA 90 MG TABS tablet Take 1 tablet (90 mg total) by mouth 2 (two) times daily.  . [DISCONTINUED] furosemide (LASIX) 80 MG tablet Take 1 tablet (80 mg total) by mouth daily.  . [DISCONTINUED] metoprolol succinate (TOPROL-XL) 50 MG 24 hr tablet Take 1 tablet (50 mg total) by mouth daily. Take with or immediately following a meal.  . [DISCONTINUED] nitroGLYCERIN (NITROSTAT) 0.4 MG SL tablet DISSOLVE 1 TABLET UNDER THE TONGUE EVERY 5 MINUTES AS NEEDED FOR CHEST PAIN. CALL MD/911 IF TAKE 2 DOSES. MAX 3 DOSES/DAY  . [DISCONTINUED] pantoprazole (PROTONIX) 40 MG tablet Take 40 mg by mouth 2 (two) times a day.     Allergies:   Patient has no known allergies.   Social History   Tobacco Use  . Smoking status: Never Smoker  . Smokeless tobacco: Never Used  Substance Use Topics  . Alcohol use: Yes    Comment: Occ.   . Drug use: No     Family Hx: The patient's family history includes Diabetes in his father.  There is no history of Stomach cancer, Colon cancer, Rectal cancer, Esophageal cancer, or Pancreatic cancer.  ROS:   Please see the history of present illness.    All other systems reviewed and are negative.   Prior CV studies:   The following studies were reviewed today: Conclusion     Mid LM to Dist LM lesion is 95% stenosed.  Prox LAD lesion is 100% stenosed.  Prox Cx lesion is 100% stenosed.  Prox RCA to Dist RCA lesion is 100% stenosed.  LV end diastolic pressure is normal.  Hemodynamic findings consistent with mild pulmonary hypertension.   1. Occlusive native vessel CAD.     -95% distal left main    - 100% proximal LAD after the first septal perforator    - 100% LCx after a very small OM1.    - 100% proximal RCA 2. Patent LIMA to the LAD 3. Patent free radial graft arising from the mid LIMA graft and supplying the first diagonal and OM 4. SVG to RCA is known to be occluded. The distal RCA is supplied by collaterals.  5. Normal LV filling pressures 6. Mild pulmonary HTN. 7. Preserved cardiac output.  Plan: continue medical therapy for CHF. Will switch IV lasix to po. Stop IV Ntg and heparin.       Labs/Other Tests and Data Reviewed:    EKG:  No ECG reviewed.  Recent Labs: 01/28/2019: ALT 26; Magnesium 1.7; TSH 3.549 01/30/2019: B Natriuretic Peptide 686.0; BUN 37; Creatinine, Ser 1.75; Hemoglobin 9.3; Platelets 146; Potassium 3.7; Sodium 136   Recent Lipid Panel Lab Results  Component Value Date/Time   CHOL 177 01/29/2019 05:41 AM   TRIG 107 01/29/2019 05:41 AM   HDL 45 01/29/2019 05:41 AM   CHOLHDL 3.9 01/29/2019 05:41 AM   LDLCALC 111 (H) 01/29/2019 05:41 AM   LDLDIRECT 155.0 07/13/2017 10:33 AM    Wt Readings from Last 3 Encounters:  01/31/19 248 lb (112.5 kg)  01/30/19 246 lb 11.1 oz (111.9 kg)  01/10/19 248 lb (112.5 kg)     Objective:    Vital Signs:  Ht 6' (1.829 m)   Wt 248 lb (112.5 kg)   BMI 33.63 kg/m  VITAL SIGNS:   reviewed GEN:  no acute distress NEURO:  alert and oriented x 3, no obvious focal deficit PSYCH:  normal affect  ASSESSMENT & PLAN:    1. Acute on Chronic Combined CHF: He is home one day and is feeling fine. He does not have any of his medications yet, as there were not Rx sent from his discharge. I will call in all of his medications and make them a 90 supply. He asked me to review each of his medications, when they are to be taken and what they are for on this visit. I have done so. He has multiple complaints about the refills. I will pass this on to our office manager and nurse supervisor. He is to keep his appointment with Dr.Christopher for June 1.   2. Hypertension: BP was controlled in the hospital.  Once he has his medications refilled he will need to keep track of his BP and weight.   3. CAD: Stable disease per cardiac cath in2/2020. He will continue DAPT. I have sent Rx for and for refills to local pharmacy in [place].  4. Hypercholesterolemia; Continue on statin therapy. He may be considered for PCSK-9 therapy once follow up labs have been completed.    COVID-19 Education: The signs and symptoms of COVID-19 were discussed with the patient and how to seek care for testing (follow up with PCP or arrange E-visit).  The importance of social distancing was discussed today.  Time:   Today, I have spent 30 minutes with the patient with telehealth technology discussing the above problems.     Medication Adjustments/Labs and Tests Ordered: Current medicines are reviewed at length with the patient today.  Concerns regarding medicines are outlined above.   Tests Ordered: No orders of the defined types were placed in this encounter.   Medication Changes: Meds ordered this encounter  Medications  . atorvastatin (LIPITOR) 80 MG tablet    Sig: Take 1 tablet (80 mg total) by mouth at bedtime.    Dispense:  90 tablet    Refill:  3  . BRILINTA 90 MG TABS tablet    Sig: Take 1  tablet (90 mg total) by mouth 2 (two) times daily.    Dispense:  180 tablet    Refill:  3  . furosemide (LASIX) 80 MG tablet    Sig: Take 1 tablet (80 mg total) by mouth daily. TAKE ON EMPTY STOMACH    Dispense:  90 tablet    Refill:  3  . nitroGLYCERIN (NITROSTAT) 0.4 MG SL tablet    Sig: DISSOLVE 1 TABLET UNDER THE TONGUE EVERY 5 MINUTES AS NEEDED FOR CHEST PAIN. CALL MD/911 IF TAKE 2 DOSES. MAX 3 DOSES/DAY    Dispense:  25 tablet    Refill:  3  . metoprolol succinate (TOPROL-XL) 50 MG 24 hr tablet    Sig: Take 1 tablet (50 mg total) by mouth daily at 12 noon. Take with or immediately following a meal.    Dispense:  90 tablet    Refill:  3  . pantoprazole (PROTONIX) 40 MG tablet    Sig: Take 1 tablet (40 mg total) by mouth 2 (two) times a day.    Dispense:  90 tablet    Refill:  3    Disposition:  Follow up previously scheduled appointment with Dr. Harrell Gave in June  Signed, Fifi Schindler M. West Pugh, ANP, [facility]  01/31/2019 10:37 AM    [facility]

## 2019-01-30 NOTE — Progress Notes (Addendum)
Inpatient Diabetes Program Recommendations  AACE/ADA: New Consensus Statement on Inpatient Glycemic Control (2015)  Target Ranges:  Prepandial:   less than 140 mg/dL      Peak postprandial:   less than 180 mg/dL (1-2 hours)      Critically ill patients:  140 - 180 mg/dL   Lab Results  Component Value Date   GLUCAP 188 (H) 01/30/2019   HGBA1C 8.5 (H) 01/28/2019    Review of Glycemic Control Results for Russell Trevino, Russell Trevino (MRN [id number]) as of 01/30/2019 10:22  Ref. Range 01/29/2019 16:51 01/29/2019 21:07 01/30/2019 06:25 01/30/2019 08:46  Glucose-Capillary Latest Ref Range: 70 - 99 mg/dL 197 (H) 101 (H) 69 (L) 188 (H)    Outpatient Diabetes medications: humalin 70/30 70 units QAM, 40 units QPM, Metformin 1000 mg QAM Current orders for Inpatient glycemic control: Novolog 0-9 units TID, Novolog 0-5 units QHS, Novolog 70/30 40 units BID  Inpatient Diabetes Program Recommendations:    If to remain inpatient, consider reducing Novolog 70/30 to 35 units BID as patient experienced mild hypoglycemia this am of 69 mg/dL.  Thanks, Bronson Curb, MSN, RNC-OB Diabetes Coordinator [phone] (8a-5p)

## 2019-01-30 NOTE — Progress Notes (Addendum)
Progress Note  Patient Name: Russell Trevino Date of Encounter: 01/30/2019  Primary Cardiologist: Buford Dresser, MD   Subjective   Feels better.Wants to go home.  Inpatient Medications    Scheduled Meds: . amLODipine  5 mg Oral Daily  . aspirin EC  81 mg Oral Q1200  . atorvastatin  80 mg Oral Daily  . enoxaparin (LOVENOX) injection  50 mg Subcutaneous QHS  . finasteride  5 mg Oral Daily  . furosemide  80 mg Intravenous BID  . insulin aspart  0-5 Units Subcutaneous QHS  . insulin aspart  0-9 Units Subcutaneous TID WC  . insulin aspart protamine- aspart  40 Units Subcutaneous BID WC  . isosorbide mononitrate  60 mg Oral Daily  . metoprolol succinate  50 mg Oral Daily  . pantoprazole  40 mg Oral BID  . sacubitril-valsartan  1 tablet Oral BID  . sodium chloride flush  3 mL Intravenous Once  . sucralfate  1 g Oral TID  . ticagrelor  90 mg Oral BID   Continuous Infusions: . sodium chloride Stopped (01/28/19 2335)  . nitroGLYCERIN Stopped (01/29/19 1416)   PRN Meds: acetaminophen, alum & mag hydroxide-simeth, fentaNYL (SUBLIMAZE) injection, nitroGLYCERIN, ondansetron (ZOFRAN) IV, zolpidem   Vital Signs    Vitals:   01/29/19 1900 01/29/19 2325 01/30/19 0326 01/30/19 0608  BP: 122/65 (!) 110/48 (!) 102/44   Pulse: 69 75 65   Resp: (!) 21 14 14    Temp: 98.7 F (37.1 C) 99.1 F (37.3 C) 98.6 F (37 C)   TempSrc: Oral Oral Oral   SpO2: 99% 95% 93%   Weight:    111.9 kg  Height:        Intake/Output Summary (Last 24 hours) at 01/30/2019 0748 Last data filed at 01/29/2019 2327 Gross per 24 hour  Intake 123.2 ml  Output 2060 ml  Net -1936.8 ml   Last 3 Weights 01/30/2019 01/29/2019 01/28/2019  Weight (lbs) 246 lb 11.1 oz 251 lb 1.7 oz 252 lb 3.3 oz  Weight (kg) 111.9 kg 113.9 kg 114.4 kg      Telemetry    NSR - Personally Reviewed  ECG    No repeated, - Personally Reviewed  Physical Exam  LYING FLAT GEN: No acute distress.   Neck: No JVD Cardiac:  RRR, no murmurs, rubs, or gallops. Ankle edema improved. Respiratory: Clear to auscultation bilaterally. GI: Soft, nontender, non-distended  MS: No edema; No deformity. Neuro:  Nonfocal  Psych: Normal affect   Labs    Chemistry Recent Labs  Lab 01/28/19 0923 01/28/19 2326 01/29/19 0541 01/30/19 0310  NA 135  --  137 136  K 4.3  --  5.3* 3.7  CL 102  --  102 103  CO2 20*  --  22 22  GLUCOSE 265*  --  268* 67*  BUN 36*  --  39* 37*  CREATININE 1.78*  --  1.89* 1.75*  CALCIUM 9.3  --  9.1 8.6*  PROT  --  7.1  --   --   ALBUMIN  --  3.5  --   --   AST  --  25  --   --   ALT  --  26  --   --   ALKPHOS  --  68  --   --   BILITOT  --  2.9*  --   --   GFRNONAA 38*  --  36* 39*  GFRAA 44*  --  41* 45*  ANIONGAP 13  --  13 11     Hematology Recent Labs  Lab 01/28/19 0923 01/29/19 0541 01/30/19 0310  WBC 11.1* 9.9 9.8  RBC 3.51* 3.25* 3.03*  HGB 10.6* 9.9* 9.3*  HCT 32.0* 29.0* 27.0*  MCV 91.2 89.2 89.1  MCH 30.2 30.5 30.7  MCHC 33.1 34.1 34.4  RDW 14.6 14.7 14.5  PLT 159 134* 146*    Cardiac Enzymes Recent Labs  Lab 01/28/19 2151 01/28/19 2326 01/29/19 0541 01/29/19 1119  TROPONINI 1.77* 1.80* 1.73* 3.12*   No results for input(s): TROPIPOC in the last 168 hours.   BNP Recent Labs  Lab 01/28/19 1820 01/30/19 0310  BNP 2,135.0* 686.0*     DDimer No results for input(s): DDIMER in the last 168 hours.   Radiology    Dg Chest 2 View  Result Date: 01/28/2019 CLINICAL DATA:  Chest pain EXAM: CHEST - 2 VIEW COMPARISON:  October 25, 2018 FINDINGS: There is no edema or consolidation. Heart is mildly enlarged with pulmonary vascularity normal. No adenopathy. Patient is status post coronary artery bypass grafting. There is mild degenerative change in the thoracic spine. IMPRESSION: Mild cardiac enlargement. Postoperative change. No edema or consolidation. Electronically Signed   By: Lowella Grip III M.D.   On: 01/28/2019 09:53    Cardiac Studies   No  new data.  Patient Profile     68 y.o. male with a hx of CAD s/p prior CABG X 4 in 2006 and recent NSTEMI in Feb 2020 and ICM with EF 40-45%, last cath 2/14/200 with patent grafts except VG to RCA which is known to be occluded and distal RCA supplied by collaterals. Heart failure presentation after he could not get meds refilled.  Assessment & Plan    1. Acute on chronic combined systolic and diastolic heart failure: Excellent diuresis over night. Plan switch to oral lasix.Plan optimize guideline directed therapy for LV dysfunction 2. CAD with CABG and graft failure: Angina improved with diuresis. 3. NSTEMI: type 2 MI secondary to volume overload.   Plan home later today if BP stable 2-3 hours after AM meds.  Ambulate with standing BP.  For questions or updates, please contact [place] Please consult [email or web address] for contact info under        Signed, Sinclair Grooms, MD  01/30/2019, 7:48 AM

## 2019-01-30 NOTE — Discharge Summary (Addendum)
The patient has been seen in conjunction with Reino Bellis, NP. All aspects of care have been considered and discussed. The patient has been personally interviewed, examined, and all clinical data has been reviewed.   Admitted with CHF due to volume overload from discontinuing furosemide. Angina due to increased LV was tension resolved with diuresis.  Imdur added for collateral bed.   Discharge Summary    Patient ID: Russell Trevino,  MRN: [id number], DOB/AGE: Jan 23, 1951 68 y.o.  Admit date: 01/28/2019 Discharge date: 01/30/2019  Primary Care Provider: Binnie Trevino Primary Cardiologist: Dr. Harrell Trevino  Discharge Diagnoses    Principal Problem:   Unstable angina [facility]) Active Problems:   Essential hypertension, benign   Type 2 diabetes mellitus with stage 3 chronic kidney disease, with long-term current use of insulin (HCC)   Chronic combined systolic and diastolic heart failure ([place])   CKD stage 3 due to type 2 diabetes mellitus (HCC)   Allergies No Known Allergies  Diagnostic Studies/Procedures    N/a  _____________   History of Present Illness    68 y.o. male with a hx of CAD s/p prior CABG X 4 in 2006 and recent NSTEMI in Feb 2020 and ICM with EF 40-45%, last cath 2/14/200 with patent grafts except VG to RCA which was known to be occluded and distal RCA supplied by collaterals, who presented with chest pain.    Mr. Russell Trevino with above cardiac hx of CABG 2006 LIMA-LAD; L radial to OM-D1, and VG to PDA and then NStEMI 10/2018, EF 40-45% and recent cath for chest pain with significant native CAD but patent grafts except known occluded FG to RCA. Also followed by GI for GERD and heartburn.  He is on protonix 40 BID, and carafate . Other hx of HLD, kidney stones and DM-2.    In Feb 2020 admitted with chest pain and SOB. His heartburn would start after meals or with exertion.  Rest or Prilosec would improve.  GI meds adjusted but overall no real improvement.  Then he  had increased of SOB. Troponin then 1.33.  Also poorly controlled DM with hgb A1C at 9.9.  Echo at that time with EF 40-45%, cavity severely dilated.  He went on to have Cardiac cath with native coronary arteries 100% stenosis and patent LIMA to LAD, Patent free radial to supplying the 1st diag and OM, VG to RCA was known to be occluded and the dRCA is supplied by collaterals.  He had mild pulmonary hypertension.  Discharged 10/27/18 with amlodipine, lisinopril, toprol XL, K+, spironolactone, and tricagrelor. Not always up to date on taking his meds.   At his follow up he was stable.  Though heartburn continued.  He was started on Entresto 11/05/18, with lisinopril stopped. Presented to the ER on 01/28/19 with chest pain that felt like heartburn.  He took 6 NTG and he continued with gagging and burping. Pain was the worst with lying down, with exercise and with eating.  + retching and a lot of stomach gurgling.  No colds of fevers.  EKG without acute changes.  He had taken NTG and pain resolves]d for short period of time and then would return.    BP 146/70 to 128/69 glucose elevated.     Labs this admit Na 135, K+ 4.3 Cr 1.78  Troponin 0.76.  Hgb 10.6, HCT 32  Cepheid lab for COVID neg   CXR:  Mild cardiac enlargement. Postoperative change. No edema or Consolidation    EKG:  The EKG demonstrated:  SR with IVCD, LVH no acute changes from 11/05/18.   He also reported running out of his lasix 2 weeks prior to admission and had increased LE edema and orthopnea. This occurred with the chest burning, He was admitted and started on IV lasix with plans to cycle troponins. Placed on IV nitro and heparin pending work up.    Hospital Course     1. Acute on chronic combined HF: Diuresed well with IV lasix with DC weight 246 lbs. He was transitioned back to oral lasix 80mg  daily. Instructed to follow weights and low Na+ diet at home.   -- on BB, Entresto  2. NSTEMI: troponin peaked at 3. Felt to be 2/2  to volume overload and known Hx of underlying CAD.   3. CAD: hx of CABG 2006 and stenting. Last cath 2/20 with stable disease. Known occluded SVG to RCA. Continued on DAPT with ASA/Brilinta and statin. He was weaned from IV nitro and placed on Imdur 30mg  daily.   4. GI discomfort: on carafate   5. HL: LDL 111 on high dose statin. Will need to consider PCSK9s as an outpatient.   6. DM: continued on metformin and home dose insulin resumed at discharge.    Russell Trevino was seen by Dr. Tamala Trevino and determined stable for discharge home. Follow up in the office has been arranged. Medications are listed below.   _____________  Discharge Vitals Blood pressure 124/63, pulse 66, temperature 98.1 F (36.7 C), temperature source Oral, resp. rate 12, height 6' (1.829 m), weight 111.9 kg, SpO2 100 %.  Filed Weights   01/28/19 2031 01/29/19 0345 01/30/19 0608  Weight: 114.4 kg 113.9 kg 111.9 kg    Labs & Radiologic Studies    CBC Recent Labs    01/29/19 0541 01/30/19 0310  WBC 9.9 9.8  HGB 9.9* 9.3*  HCT 29.0* 27.0*  MCV 89.2 89.1  PLT 134* 007*   Basic Metabolic Panel Recent Labs    01/28/19 2326 01/29/19 0541 01/30/19 0310  NA  --  137 136  K  --  5.3* 3.7  CL  --  102 103  CO2  --  22 22  GLUCOSE  --  268* 67*  BUN  --  39* 37*  CREATININE  --  1.89* 1.75*  CALCIUM  --  9.1 8.6*  MG 1.7  --   --    Liver Function Tests Recent Labs    01/28/19 2326  AST 25  ALT 26  ALKPHOS 68  BILITOT 2.9*  PROT 7.1  ALBUMIN 3.5   Recent Labs    01/28/19 0923  LIPASE 25   Cardiac Enzymes Recent Labs    01/28/19 2326 01/29/19 0541 01/29/19 1119  TROPONINI 1.80* 1.73* 3.12*   BNP Invalid input(s): POCBNP D-Dimer No results for input(s): DDIMER in the last 72 hours. Hemoglobin A1C Recent Labs    01/28/19 2151  HGBA1C 8.5*   Fasting Lipid Panel Recent Labs    01/29/19 0541  CHOL 177  HDL 45  LDLCALC 111*  TRIG 107  CHOLHDL 3.9   Thyroid Function  Tests Recent Labs    01/28/19 2151  TSH 3.549   _____________  Dg Chest 2 View  Result Date: 01/28/2019 CLINICAL DATA:  Chest pain EXAM: CHEST - 2 VIEW COMPARISON:  October 25, 2018 FINDINGS: There is no edema or consolidation. Heart is mildly enlarged with pulmonary vascularity normal. No adenopathy. Patient is status post coronary artery bypass grafting.  There is mild degenerative change in the thoracic spine. IMPRESSION: Mild cardiac enlargement. Postoperative change. No edema or consolidation. Electronically Signed   By: Lowella Grip III M.D.   On: 01/28/2019 09:53   Disposition   Pt is being discharged home today in good condition.  Follow-up Plans & Appointments    Follow-up Information    Buford Dresser, MD Follow up on 02/11/2019.   Specialty:  Cardiology Contact information: [address] [place] [address] [phone]          Discharge Instructions    (HEART FAILURE PATIENTS) Call MD:  Anytime you have any of the following symptoms: 1) 3 pound weight gain in 24 hours or 5 pounds in 1 week 2) shortness of breath, with or without a dry hacking cough 3) swelling in the hands, feet or stomach 4) if you have to sleep on extra pillows at night in order to breathe.   Complete by:  As directed    Diet - low sodium heart healthy   Complete by:  As directed    Increase activity slowly   Complete by:  As directed        Discharge Medications     Medication List    STOP taking these medications   amLODipine 5 MG tablet Commonly known as:  NORVASC   gabapentin 100 MG capsule Commonly known as:  NEURONTIN     TAKE these medications   aspirin 81 MG tablet Take 81 mg by mouth daily at 12 noon.   atorvastatin 80 MG tablet Commonly known as:  LIPITOR Take 1 tablet (80 mg total) by mouth daily. What changed:  when to take this   BD Veo Insulin Syringe U/F 31G X 15/64" 1 ML Misc Generic drug:  Insulin Syringe-Needle U-100 use as  directed TO INJECT INSULIN  twice a day   Brilinta 90 MG Tabs tablet Generic drug:  ticagrelor Take 1 tablet (90 mg total) by mouth 2 (two) times daily.   finasteride 5 MG tablet Commonly known as:  PROSCAR Take 1 tablet (5 mg total) by mouth daily. What changed:    when to take this  additional instructions   furosemide 80 MG tablet Commonly known as:  LASIX Take 1 tablet (80 mg total) by mouth daily.   insulin NPH-regular Human (70-30) 100 UNIT/ML injection Commonly known as:  HumuLIN 70/30 90 units with breakfast, and 55 units with supper What changed:    how much to take  how to take this  when to take this  additional instructions   isosorbide mononitrate 30 MG 24 hr tablet Commonly known as:  IMDUR Take 1 tablet (30 mg total) by mouth daily. Start taking on:  Jan 31, 2019   metFORMIN 1000 MG tablet Commonly known as:  GLUCOPHAGE Take 1 tablet (1,000 mg total) by mouth daily with breakfast. Follow=up appt is due must see provider for 90 day suppy   metoprolol succinate 50 MG 24 hr tablet Commonly known as:  TOPROL-XL Take 1 tablet (50 mg total) by mouth daily. Take with or immediately following a meal.   nitroGLYCERIN 0.4 MG SL tablet Commonly known as:  NITROSTAT DISSOLVE 1 TABLET UNDER THE TONGUE EVERY 5 MINUTES AS NEEDED FOR CHEST PAIN. CALL MD/911 IF TAKE 2 DOSES. MAX 3 DOSES/DAY   pantoprazole 40 MG tablet Commonly known as:  PROTONIX Take 40 mg by mouth 2 (two) times a day.   potassium chloride SA 20 MEQ tablet Commonly known as:  K-DUR Take 1 tablet (20 mEq total) by mouth daily.   sacubitril-valsartan 49-51 MG Commonly known as:  Entresto Take 1 tablet by mouth 2 (two) times daily.   sildenafil 20 MG tablet Commonly known as:  REVATIO Take 1 tablet (20 mg total) by mouth as needed. What changed:  reasons to take this   sucralfate 1 g tablet Commonly known as:  CARAFATE TAKE 1 TABLET(1 GRAM) BY MOUTH FOUR TIMES DAILY AT BEDTIME WITH  MEALS What changed:  See the new instructions.          Acute coronary syndrome (MI, NSTEMI, STEMI, etc) this admission?:  No.  The elevated Troponin was due to the acute medical illness or demand ischemia.     Outstanding Labs/Studies   N/a  Duration of Discharge Encounter   Greater than 30 minutes including physician time.  Signed, Reino Bellis NP-C 01/30/2019, 12:04 PM

## 2019-01-31 ENCOUNTER — Telehealth (INDEPENDENT_AMBULATORY_CARE_PROVIDER_SITE_OTHER): Payer: Medicare Other | Admitting: Adult Health

## 2019-01-31 ENCOUNTER — Telehealth: Payer: Self-pay | Admitting: *Deleted

## 2019-01-31 ENCOUNTER — Encounter: Payer: Self-pay | Admitting: Adult Health

## 2019-01-31 VITALS — Ht 72.0 in | Wt 248.0 lb

## 2019-01-31 DIAGNOSIS — I10 Essential (primary) hypertension: Secondary | ICD-10-CM | POA: Diagnosis not present

## 2019-01-31 MED ORDER — ATORVASTATIN CALCIUM 80 MG PO TABS: 80.0000 mg | ORAL_TABLET | Freq: Every day | ORAL | 3 refills | Status: DC

## 2019-01-31 MED ORDER — PANTOPRAZOLE SODIUM 40 MG PO TBEC: 40.0000 mg | DELAYED_RELEASE_TABLET | Freq: Two times a day (BID) | ORAL | 3 refills | Status: DC

## 2019-01-31 MED ORDER — NITROGLYCERIN 0.4 MG SL SUBL: SUBLINGUAL_TABLET | SUBLINGUAL | 3 refills | Status: DC

## 2019-01-31 MED ORDER — FUROSEMIDE 80 MG PO TABS: 80.0000 mg | ORAL_TABLET | Freq: Every day | ORAL | 3 refills | Status: DC

## 2019-01-31 MED ORDER — METOPROLOL SUCCINATE ER 50 MG PO TB24: 50.0000 mg | ORAL_TABLET | Freq: Every day | ORAL | 3 refills | Status: DC

## 2019-01-31 MED ORDER — BRILINTA 90 MG PO TABS: 90.0000 mg | ORAL_TABLET | Freq: Two times a day (BID) | ORAL | 3 refills | Status: DC

## 2019-01-31 NOTE — Telephone Encounter (Signed)
Pt was on TCM report he was admitted 01/28/19 with CHF due to volume overload from discontinuing furosemide. Angina due to increased LV was tension resolved with diuresis. Imdur added for collateral bed. Pt D/C 01/29/19, and will follow=up w/cardiology

## 2019-01-31 NOTE — Patient Instructions (Addendum)
Follow-Up: You will need a follow up appointment [place] your Advanced Practice Providers on your designated Care Team:  Rosaria Ferries, PA-C Jory Sims, DNP, ANP       Medication Instructions:  NO CHANGES, YOUR REFILLS HAVE BEEN SENT TO [facility]- Your physician recommends that you continue on your current medications as directed. Please refer to the Current Medication list given to you today. If you need a refill on your cardiac medications before your next appointment, please call your pharmacy. Labwork: When you have labs (blood work) and your tests are completely normal, you will receive your results ONLY by [place] (if you have MyChart) -OR- A paper copy in the mail.  At [facility], you and your health needs are our priority.  As part of our continuing mission to provide you with exceptional heart care, we have created designated Provider Care Teams.  These Care Teams include your primary Cardiologist (physician) and Advanced Practice Providers (APPs -  Physician Assistants and Nurse Practitioners) who all work together to provide you with the care you need, when you need it.  Thank you for choosing [facility] at [facility]!!

## 2019-02-10 NOTE — Patient Instructions (Addendum)
Follow up with GI.   Follow up with cardiology.    Tests ordered today. Your results will be released to [place] (or called to you) after review, usually within 72hours after test completion. If any changes need to be made, you will be notified at that same time.   Medications reviewed and updated.  Changes include :   Hold the metformin.   Stop the sucralfate.  Take lunesta 2 mg nightly  Your prescription(s) have been submitted to your pharmacy. Please take as directed and contact our office if you believe you are having problem(s) with the medication(s).   Please followup in 3 months

## 2019-02-10 NOTE — Progress Notes (Signed)
Subjective:    Patient ID: Russell Trevino, male    DOB: 1951-09-01, 68 y.o.   MRN: [id number]  HPI The patient is here for follow up.  He is not exercising regularly.     He was admitted for CHF two weeks ago.  He was diuresed   He still has SOB and states that he did not feel better when he left the hospital compared to when he went in.  He is concerned that he is still short of breath and does not know why.  He sees cardiology in two days.  He denies any leg swelling.  In the past two weeks his stomach has been torn up. He has not been able to sleep.  He only sleeps a couple of hours.  His stomach gurgles and he has diarrhea.  If he eats he has to run to the bathroom.   All of this is new.  He denies any abdominal pain.  He denies blood in the stool.  He has lost weight because he is not able to eat much because if he eats more than just a little bit he feels like he will get sick.  CAD, combined systolic and diastolic HF, Hypertension: He is taking his medication daily.  He denies chest pain, palpitations, edema and regular headaches.  He is still experience shortness of breath with exertion.    Hyperlipidemia: He is taking his medication daily. He is compliant with a low fat/cholesterol diet. He denies myalgias.   Diabetes: He is taking his medication daily-he is taking the metformin twice a day he thinks. He is compliant with a diabetic diet.  He monitors his sugars and they have been running 140-160's.   GERD:  He is taking his medication daily as prescribed.  He denies any GERD symptoms.  He has been taking the Carafate 4 or 5 times a day.  He states this is something that helps his stomach.  After he eats anything it does not matter what he eats he will have abdominal symptoms-no pain, just gurgling, discomfort and he will have to go to the bathroom after he eats.  Stools are loose.  He has no blood and denies true abdominal pain.  He is not able to eat as much as usual because if  he continues to eat he will get sick.  He has lost weight.   Medications and allergies reviewed with patient and updated if appropriate.  Patient Active Problem List   Diagnosis Date Noted  . Unstable angina ([place]) 01/28/2019  . CKD stage 3 due to type 2 diabetes mellitus ([place]) 01/28/2019  . Ischemic cardiomyopathy 11/05/2018  . Chronic combined systolic and diastolic heart failure ([place]) 11/05/2018  . Type 2 diabetes mellitus with stage 3 chronic kidney disease, with long-term current use of insulin ([place]) 08/26/2017  . GERD (gastroesophageal reflux disease) 07/13/2017  . Cough 08/17/2016  . Thoracic back pain 06/21/2016  . Hx of CABG 05/26/2016  . Prostate cancer ([place]) 05/26/2016  . Essential hypertension, benign 05/26/2016  . Mixed hyperlipidemia 05/26/2016  . Cardiomyopathy ([place]) 03/25/2013  . Coronary artery disease due to calcified coronary lesion 03/25/2013    Current Outpatient Medications on File Prior to Visit  Medication Sig Dispense Refill  . aspirin 81 MG tablet Take 81 mg by mouth daily at 12 noon.     Marland Kitchen atorvastatin (LIPITOR) 80 MG tablet Take 1 tablet (80 mg total) by mouth at bedtime. 90 tablet 3  . BD  VEO INSULIN SYR ULTRAFINE 31G X 15/64" 1 ML MISC use as directed TO INJECT INSULIN  twice a day 10 each 0  . BRILINTA 90 MG TABS tablet Take 1 tablet (90 mg total) by mouth 2 (two) times daily. 180 tablet 3  . finasteride (PROSCAR) 5 MG tablet Take 1 tablet (5 mg total) by mouth daily. (Patient taking differently: Take 5 mg by mouth daily at 12 noon. 12 noon) 90 tablet 1  . furosemide (LASIX) 80 MG tablet Take 1 tablet (80 mg total) by mouth daily. TAKE ON EMPTY STOMACH 90 tablet 3  . insulin NPH-regular Human (HUMULIN 70/30) (70-30) 100 UNIT/ML injection 90 units with breakfast, and 55 units with supper (Patient taking differently: Inject 40-70 Units into the skin See admin instructions. 70 mg in the morning and 40 mg in the evening) 50 mL 3  . isosorbide mononitrate  (IMDUR) 30 MG 24 hr tablet Take 1 tablet (30 mg total) by mouth daily. 30 tablet 3  . metFORMIN (GLUCOPHAGE) 1000 MG tablet Take 1 tablet (1,000 mg total) by mouth daily with breakfast. Follow=up appt is due must see provider for 90 day suppy 30 tablet 0  . metoprolol succinate (TOPROL-XL) 50 MG 24 hr tablet Take 1 tablet (50 mg total) by mouth daily at 12 noon. Take with or immediately following a meal. 90 tablet 3  . nitroGLYCERIN (NITROSTAT) 0.4 MG SL tablet DISSOLVE 1 TABLET UNDER THE TONGUE EVERY 5 MINUTES AS NEEDED FOR CHEST PAIN. CALL MD/911 IF TAKE 2 DOSES. MAX 3 DOSES/DAY 25 tablet 3  . pantoprazole (PROTONIX) 40 MG tablet Take 1 tablet (40 mg total) by mouth 2 (two) times a day. 90 tablet 3  . potassium chloride SA (K-DUR) 20 MEQ tablet Take 1 tablet (20 mEq total) by mouth daily. 90 tablet 1  . sacubitril-valsartan (ENTRESTO) 49-51 MG Take 1 tablet by mouth 2 (two) times daily. 60 tablet 11  . sildenafil (REVATIO) 20 MG tablet Take 1 tablet (20 mg total) by mouth as needed. (Patient taking differently: Take 20 mg by mouth as needed (erectile dysfunction). ) 90 tablet 1  . sucralfate (CARAFATE) 1 g tablet TAKE 1 TABLET(1 GRAM) BY MOUTH FOUR TIMES DAILY AT BEDTIME WITH MEALS (Patient taking differently: Take 1 g by mouth 3 (three) times daily. Last dose at bedtime) 360 tablet 1   No current facility-administered medications on file prior to visit.     Past Medical History:  Diagnosis Date  . BPH (benign prostatic hypertrophy)   . Cardiomyopathy, ischemic   . Coronary artery disease    at [facility], [place] 2009 (GRAFTS PATENT), cath 05/2018 w/ SVG-PDA 100%>>med rx  . Diabetes mellitus, type 2 ([place])   . Frequency of urination   . GERD (gastroesophageal reflux disease)   . Gross hematuria   . History of CHF (congestive heart failure) 2007  . History of kidney stones   . History of myocardial infarction 2006, 2019   2006>>CABG, 2019 cath w/ med rx  . History of urinary retention   .  Hyperlipidemia   . Nocturia   . Renal calculi    BILATERAL PER CT SCAN--  NON-OBSTRUCTIVE  . S/P CABG x 4 2006    Past Surgical History:  Procedure Laterality Date  . CARDIAC CATHETERIZATION  01/17/2008   GRAFTS PATENT / EF 40%,  DR SANTOS ([facility])  . CORONARY ARTERY BYPASS GRAFT  04/2005   LIMA-LAD, L radial-OM-D1 and SVG-PDA  . CYSTO / RIGHT URETERAL STENT PLACEMENT  10-08-1999  . CYSTO/  REMOVAL BLADDER STONES  01-14-2010  . CYSTOSCOPY  10/16/2012   Procedure: CYSTOSCOPY;  Surgeon: Malka So, MD;  Location: [facility];  Service: Urology;  Laterality: N/A;  Cystoscopy with Clot Evacuation and fulguration bladder neck.  Marland Kitchen EXTRACORPOREAL SHOCK WAVE LITHOTRIPSY  10-15-2009   RIGHT  . KNEE ARTHROSCOPY  1978   RIGHT  . LEFT SHOULDER SURGERY   2002  . LUMBAR FUSION  09-10-2008   L4 - L5  . PERCUTANEOUS NEPHROSTOLITHOTOMY  1979  . PROSTATE BIOPSY  10/16/2012   Procedure: PROSTATE BIOPSY;  Surgeon: Malka So, MD;  Location[facility];  Service: Urology;  Laterality: N/A;  Through ultrasound.  Marland Kitchen RIGHT/LEFT HEART CATH AND CORONARY/GRAFT ANGIOGRAPHY N/A 10/26/2018   Procedure: RIGHT/LEFT HEART CATH AND CORONARY/GRAFT ANGIOGRAPHY;  Surgeon: Martinique, Peter M, MD;  Location: [facility];  Service: Cardiovascular;  Laterality: N/A;  . SHOULDER ARTHROSCOPY W/ SUBACROMIAL DECOMPRESSION AND DISTAL CLAVICLE EXCISION  10-11-2004   AND PARTIAL ROTATOR CUFF REPAIR  . TRANSTHORACIC ECHOCARDIOGRAM  77/82/4235   LV SYSTOLIC FUNCTION MILDLY REDUCED/ EF 48%/ LV FILLING PATTERN  IS IMPAIRED/ HYPOKINESIS IN THE MID AND BASALAR SEPTUM & ANTEROSEPTUM  . TURP VAPORIZATION  01-19-2010   BPH W/ BOO  . URETEROLITHOTOMY  1976    Social History   Socioeconomic History  . Marital status: Married    Spouse name: Not on file  . Number of children: 4  . Years of education: Not on file  . Highest education level: Not on file  Occupational History  . Not on file   Social Needs  . Financial resource strain: Not on file  . Food insecurity:    Worry: Not on file    Inability: Not on file  . Transportation needs:    Medical: Not on file    Non-medical: Not on file  Tobacco Use  . Smoking status: Never Smoker  . Smokeless tobacco: Never Used  Substance and Sexual Activity  . Alcohol use: Yes    Comment: Occ.   . Drug use: No  . Sexual activity: Not on file  Lifestyle  . Physical activity:    Days per week: Not on file    Minutes per session: Not on file  . Stress: Not on file  Relationships  . Social connections:    Talks on phone: Not on file    Gets together: Not on file    Attends religious service: Not on file    Active member of club or organization: Not on file    Attends meetings of clubs or organizations: Not on file    Relationship status: Not on file  Other Topics Concern  . Not on file  Social History Narrative   Lives in [place], [place], has family and friends in [place].    Family History  Problem Relation Age of Onset  . Diabetes Father   . Stomach cancer Neg Hx   . Colon cancer Neg Hx   . Rectal cancer Neg Hx   . Esophageal cancer Neg Hx   . Pancreatic cancer Neg Hx     Review of Systems  Constitutional: Negative for chills and fever.  Respiratory: Positive for cough (occ, dry cough) and shortness of breath (with exertion). Negative for wheezing.   Cardiovascular: Negative for chest pain, palpitations and leg swelling.  Gastrointestinal: Positive for diarrhea and nausea. Negative for abdominal pain and blood in stool.  Abdominal gurgling, no GERD  Neurological: Negative for light-headedness and headaches.  Psychiatric/Behavioral: Positive for sleep disturbance.       Objective:   Vitals:   02/11/19 1025  BP: (!) 148/72  Pulse: 73  Resp: 18  Temp: 98.2 F (36.8 C)  SpO2: 97%   BP Readings from Last 3 Encounters:  02/11/19 (!) 148/72  01/30/19 124/63  11/09/18 122/60   Wt Readings from Last 3  Encounters:  02/11/19 238 lb (108 kg)  01/31/19 248 lb (112.5 kg)  01/30/19 246 lb 11.1 oz (111.9 kg)   Body mass index is 32.28 kg/m.   Physical Exam    Constitutional: Appears well-developed and well-nourished. No distress.  HENT:  Head: Normocephalic and atraumatic.  Neck: Neck supple. No tracheal deviation present. No thyromegaly present.  No cervical lymphadenopathy Cardiovascular: Normal rate, regular rhythm and normal heart sounds.   No murmur heard. No carotid bruit .  No edema Pulmonary/Chest: Effort normal and breath sounds normal. No respiratory distress. No has no wheezes. No rales. Abdomen: Obese, soft, nontender Skin: Skin is warm and dry. Not diaphoretic.  Psychiatric: Normal mood and affect. Behavior is normal.      Assessment & Plan:    See Problem List for Assessment and Plan of chronic medical problems.

## 2019-02-11 ENCOUNTER — Other Ambulatory Visit: Payer: Self-pay

## 2019-02-11 ENCOUNTER — Other Ambulatory Visit (INDEPENDENT_AMBULATORY_CARE_PROVIDER_SITE_OTHER): Payer: Medicare Other

## 2019-02-11 ENCOUNTER — Encounter: Payer: Self-pay | Admitting: Internal Medicine

## 2019-02-11 ENCOUNTER — Ambulatory Visit: Payer: Medicare Other | Admitting: Cardiology

## 2019-02-11 ENCOUNTER — Ambulatory Visit (INDEPENDENT_AMBULATORY_CARE_PROVIDER_SITE_OTHER): Payer: Medicare Other | Admitting: Internal Medicine

## 2019-02-11 VITALS — BP 148/72 | HR 73 | Temp 98.2°F | Resp 18 | Ht 72.0 in | Wt 238.0 lb

## 2019-02-11 DIAGNOSIS — N183 Chronic kidney disease, stage 3 unspecified: Secondary | ICD-10-CM

## 2019-02-11 DIAGNOSIS — I5042 Chronic combined systolic (congestive) and diastolic (congestive) heart failure: Secondary | ICD-10-CM

## 2019-02-11 DIAGNOSIS — Z794 Long term (current) use of insulin: Secondary | ICD-10-CM

## 2019-02-11 DIAGNOSIS — E1122 Type 2 diabetes mellitus with diabetic chronic kidney disease: Secondary | ICD-10-CM

## 2019-02-11 DIAGNOSIS — Z23 Encounter for immunization: Secondary | ICD-10-CM

## 2019-02-11 DIAGNOSIS — K219 Gastro-esophageal reflux disease without esophagitis: Secondary | ICD-10-CM

## 2019-02-11 DIAGNOSIS — E782 Mixed hyperlipidemia: Secondary | ICD-10-CM | POA: Diagnosis not present

## 2019-02-11 DIAGNOSIS — I10 Essential (primary) hypertension: Secondary | ICD-10-CM

## 2019-02-11 DIAGNOSIS — G479 Sleep disorder, unspecified: Secondary | ICD-10-CM

## 2019-02-11 LAB — COMPREHENSIVE METABOLIC PANEL
ALT: 16 U/L (ref 0–53)
AST: 16 U/L (ref 0–37)
Albumin: 4 g/dL (ref 3.5–5.2)
Alkaline Phosphatase: 73 U/L (ref 39–117)
BUN: 52 mg/dL — ABNORMAL HIGH (ref 6–23)
CO2: 22 mEq/L (ref 19–32)
Calcium: 9.5 mg/dL (ref 8.4–10.5)
Chloride: 101 mEq/L (ref 96–112)
Creatinine, Ser: 2.29 mg/dL — ABNORMAL HIGH (ref 0.40–1.50)
GFR: 28.51 mL/min — ABNORMAL LOW (ref >60.00)
Glucose, Bld: 105 mg/dL — ABNORMAL HIGH (ref 70–99)
Potassium: 4.8 mEq/L (ref 3.5–5.1)
Sodium: 137 mEq/L (ref 135–145)
Total Bilirubin: 1.1 mg/dL (ref 0.2–1.2)
Total Protein: 7.7 g/dL (ref 6.0–8.3)

## 2019-02-11 LAB — HEMOGLOBIN A1C: Hgb A1c MFr Bld: 8.3 % — ABNORMAL HIGH (ref 4.6–6.5)

## 2019-02-11 LAB — CBC WITH DIFFERENTIAL/PLATELET
Basophils Absolute: 0.1 10*3/uL (ref 0.0–0.1)
Basophils Relative: 0.6 % (ref 0.0–3.0)
Eosinophils Absolute: 0.1 10*3/uL (ref 0.0–0.7)
Eosinophils Relative: 1.7 % (ref 0.0–5.0)
HCT: 35.9 % — ABNORMAL LOW (ref 39.0–52.0)
Hemoglobin: 12.6 g/dL — ABNORMAL LOW (ref 13.0–17.0)
Lymphocytes Relative: 15.6 % (ref 12.0–46.0)
Lymphs Abs: 1.4 10*3/uL (ref 0.7–4.0)
MCHC: 35.2 g/dL (ref 30.0–36.0)
MCV: 87.2 fl (ref 78.0–100.0)
Monocytes Absolute: 0.7 10*3/uL (ref 0.1–1.0)
Monocytes Relative: 7.7 % (ref 3.0–12.0)
Neutro Abs: 6.6 10*3/uL (ref 1.4–7.7)
Neutrophils Relative %: 74.4 % (ref 43.0–77.0)
Platelets: 273 10*3/uL (ref 150.0–400.0)
RBC: 4.11 Mil/uL — ABNORMAL LOW (ref 4.22–5.81)
RDW: 15.1 % (ref 11.5–15.5)
WBC: 8.9 10*3/uL (ref 4.0–10.5)

## 2019-02-11 LAB — BRAIN NATRIURETIC PEPTIDE: Pro B Natriuretic peptide (BNP): 567 pg/mL — ABNORMAL HIGH (ref 0.0–100.0)

## 2019-02-11 LAB — TSH: TSH: 1.74 u[IU]/mL (ref 0.35–4.50)

## 2019-02-11 MED ORDER — ESZOPICLONE 2 MG PO TABS: 2.0000 mg | ORAL_TABLET | Freq: Every evening | ORAL | 0 refills | Status: DC | PRN

## 2019-02-11 NOTE — Assessment & Plan Note (Signed)
CMP

## 2019-02-11 NOTE — Assessment & Plan Note (Signed)
Recent hospitalization for combined CHF-diuresed in the hospital, but he states he did not feel any better leaving the hospital-still continues to have shortness of breath, but no edema We will check BNP, CBC, CMP He sees cardiology in a couple of days

## 2019-02-11 NOTE — Assessment & Plan Note (Signed)
Denies true GERD symptoms, but is having a lot of GI symptoms-gurgling, discomfort and diarrhea ?  Medication related Hold metformin Discontinue Carafate We will follow-up with GI

## 2019-02-11 NOTE — Assessment & Plan Note (Signed)
Continue statin. 

## 2019-02-11 NOTE — Assessment & Plan Note (Signed)
Sugars 140-160 We will check A1c We will hold metformin since this could be causing some of his GI symptoms, but may restart the timing depending on GFR if his symptoms do not change

## 2019-02-11 NOTE — Assessment & Plan Note (Signed)
Has been experiencing difficulty sleeping and is fatigued because of this ?  Side effect of Carafate-discontinue I will provide him with a few pills of Lunesta to try to get him some good sleep and get him back in a good sleep cycle, but do not want him taking this long-term

## 2019-02-11 NOTE — Assessment & Plan Note (Signed)
Blood pressure slightly elevated here, but was well controlled while he was in the hospital We will not make any changes today-has follow-up with cardiology in a couple of days CMP

## 2019-02-13 ENCOUNTER — Other Ambulatory Visit: Payer: Self-pay

## 2019-02-13 ENCOUNTER — Ambulatory Visit: Payer: Medicare Other | Admitting: Cardiology

## 2019-02-13 ENCOUNTER — Encounter: Payer: Self-pay | Admitting: Cardiology

## 2019-02-13 VITALS — BP 110/74 | HR 77 | Ht 72.0 in | Wt 239.4 lb

## 2019-02-13 DIAGNOSIS — Z79899 Other long term (current) drug therapy: Secondary | ICD-10-CM

## 2019-02-13 DIAGNOSIS — E782 Mixed hyperlipidemia: Secondary | ICD-10-CM

## 2019-02-13 DIAGNOSIS — N183 Chronic kidney disease, stage 3 unspecified: Secondary | ICD-10-CM

## 2019-02-13 DIAGNOSIS — I5042 Chronic combined systolic (congestive) and diastolic (congestive) heart failure: Secondary | ICD-10-CM | POA: Diagnosis not present

## 2019-02-13 DIAGNOSIS — E1122 Type 2 diabetes mellitus with diabetic chronic kidney disease: Secondary | ICD-10-CM

## 2019-02-13 DIAGNOSIS — I255 Ischemic cardiomyopathy: Secondary | ICD-10-CM | POA: Diagnosis not present

## 2019-02-13 DIAGNOSIS — I2584 Coronary atherosclerosis due to calcified coronary lesion: Secondary | ICD-10-CM

## 2019-02-13 DIAGNOSIS — Z951 Presence of aortocoronary bypass graft: Secondary | ICD-10-CM

## 2019-02-13 DIAGNOSIS — I2510 Atherosclerotic heart disease of native coronary artery without angina pectoris: Secondary | ICD-10-CM

## 2019-02-13 DIAGNOSIS — Z794 Long term (current) use of insulin: Secondary | ICD-10-CM

## 2019-02-13 MED ORDER — FUROSEMIDE 80 MG PO TABS: 80.0000 mg | ORAL_TABLET | Freq: Every day | ORAL | 3 refills | Status: DC | PRN

## 2019-02-13 MED ORDER — ISOSORBIDE MONONITRATE ER 60 MG PO TB24: 60.0000 mg | ORAL_TABLET | Freq: Every day | ORAL | 3 refills | Status: DC

## 2019-02-13 NOTE — Patient Instructions (Addendum)
Medication Instructions:  Increase: Imdur 60 mg daily   If you gain 3 pounds overnight or 5 pounds in a week, take a dose of furosemide. You can take one dose daily. If your weight does not come back down to your normal within three days, please call the office.   If you need a refill on your cardiac medications before your next appointment, please call your pharmacy.   Lab work Your physician recommends that you have lab work done with PCP  If you have labs (blood work) drawn today and your tests are completely normal, you will receive your results only by: Marland Kitchen MyChart Message (if you have MyChart) OR . A paper copy in the mail If you have any lab test that is abnormal or we need to change your treatment, we will call you to review the results.  Testing/Procedures: None  Follow-Up: At [facility], you and your health needs are our priority.  As part of our continuing mission to provide you with exceptional heart care, we have created designated Provider Care Teams.  These Care Teams include your primary Cardiologist (physician) and Advanced Practice Providers (APPs -  Physician Assistants and Nurse Practitioners) who all work together to provide you with the care you need, when you need it. You will need a follow up appointment in 2 weeks.  Please call our office 2 months in advance to schedule this appointment.  You may see Buford Dresser, MD or one of the following Advanced Practice Providers on your designated Care Team:   Rosaria Ferries, PA-C . Jory Sims, DNP, ANP    Do the following things EVERY DAY:  1) Weigh yourself EVERY morning after you go to the bathroom but before you eat or drink anything. Write this number down in a weight log/diary. If you gain 3 pounds overnight or 5 pounds in a week, take a dose of furosemide. You can take one dose daily. If your weight does not come back down to your normal within three days, please call the office.  2) Take your  medicines as prescribed. If you have concerns about your medications, please call us before you stop taking them.   3) Eat low salt foods-Limit salt (sodium) to 2000 mg per day. This will help prevent your body from holding onto fluid. Read food labels as many processed foods have a lot of sodium, especially canned goods and prepackaged meats. If you would like some assistance choosing low sodium foods, we would be happy to set you up with a nutritionist.  4) Stay as active as you can everyday. Staying active will give you more energy and make your muscles stronger. Start with 5 minutes at a time and work your way up to 30 minutes a day. Break up your activities--do some in the morning and some in the afternoon. Start with 3 days per week and work your way up to 5 days as you can.  If you have chest pain, feel short of breath, dizzy, or lightheaded, STOP. If you don't feel better after a short rest, call 911. If you do feel better, call the office to let us know you have symptoms with exercise.  5) Limit all fluids for the day to less than 2 liters. Fluid includes all drinks, coffee, juice, ice chips, soup, jello, and all other liquids.

## 2019-02-13 NOTE — Progress Notes (Signed)
Cardiology Office Note:    Date:  02/13/2019   ID:  Russell Trevino, DOB 19-Mar-1951, MRN [id number]  PCP:  Binnie Rail, MD  Cardiologist:  Buford Dresser, MD PhD  Referring MD: Binnie Rail, MD   CC: follow up for heart failure, with recent hospital discharge on 01/30/19  History of Present Illness:    Russell Trevino is a 68 y.o. male with a hx of CAD s/p prior CABG, recent NSTEMI, ischemic cardiomyopathy with EF 40-45% who is seen for heart failure post hospitalization follow up. He was discharged from the hospital on 01/30/19. Medication reconciliation completed at this visit as well.  Cardiac history: Hospitalized 10/2018 for NSTEMI and heart failure symptoms. Severe native CAD, no intervenable targets on cath. Recommended for medication management. Prior history notable for NSTEMI in 2006 that lead to CABG, with LIMA0LAD, L Radial-OM-D1, SVG-PDA. Noted to have ischemic cardiomyopathy with EF 40-45%. He was readmitted and recently discharged with heart failure after running out of furosemide for two weeks.    Today:  Reviewed recent hospitalization, visit with Jory Sims, recent labs from Dr. Quay Burow. He says the worst symptom was heartburn/chest pain, taking a lot of SL NG. He hadn't noticed much increase in fluid.  Off metformin, diarrhea improved since stopping. Creatinine elevated on recent labs, plans to recheck with Dr. Quay Burow' office.  Still feeling like he has no breath. Doesn't feel like his breathing improved after recent hospitalization. Even after climbing stairs up or down, needs to rest. No LE edema since leaving hospital.  Weight today 232 at home, as low as 228, high as 234. Peak before coming in the hospital this most recent time was around 239 lbs.   Reviewed medications with him today, had also been reconciled with Jory Sims via virtual visit. Reviewed heart failure education today as well. Will make sure he has furosemide refills available.   Denies shortness of breath at rest. No PND, orthopnea, LE edema or unexpected weight gain. No syncope or palpitations.   Past Medical History:  Diagnosis Date  . BPH (benign prostatic hypertrophy)   . Cardiomyopathy, ischemic   . Coronary artery disease    at [facility], [place] 2009 (GRAFTS PATENT), cath 05/2018 w/ SVG-PDA 100%>>med rx  . Diabetes mellitus, type 2 ([place])   . Frequency of urination   . GERD (gastroesophageal reflux disease)   . Gross hematuria   . History of CHF (congestive heart failure) 2007  . History of kidney stones   . History of myocardial infarction 2006, 2019   2006>>CABG, 2019 cath w/ med rx  . History of urinary retention   . Hyperlipidemia   . Nocturia   . Renal calculi    BILATERAL PER CT SCAN--  NON-OBSTRUCTIVE  . S/P CABG x 4 2006    Past Surgical History:  Procedure Laterality Date  . CARDIAC CATHETERIZATION  01/17/2008   GRAFTS PATENT / EF 40%,  DR SANTOS ([facility])  . CORONARY ARTERY BYPASS GRAFT  04/2005   LIMA-LAD, L radial-OM-D1 and SVG-PDA  . CYSTO / RIGHT URETERAL STENT PLACEMENT  10-08-1999  . CYSTO/  REMOVAL BLADDER STONES  01-14-2010  . CYSTOSCOPY  10/16/2012   Procedure: CYSTOSCOPY;  Surgeon: Malka So, MD;  Location: [facility];  Service: Urology;  Laterality: N/A;  Cystoscopy with Clot Evacuation and fulguration bladder neck.  Marland Kitchen EXTRACORPOREAL SHOCK WAVE LITHOTRIPSY  10-15-2009   RIGHT  . KNEE ARTHROSCOPY  1978   RIGHT  . LEFT SHOULDER  SURGERY   2002  . LUMBAR FUSION  09-10-2008   L4 - L5  . PERCUTANEOUS NEPHROSTOLITHOTOMY  1979  . PROSTATE BIOPSY  10/16/2012   Procedure: PROSTATE BIOPSY;  Surgeon: Malka So, MD;  Location: [facility];  Service: Urology;  Laterality: N/A;  Through ultrasound.  Marland Kitchen RIGHT/LEFT HEART CATH AND CORONARY/GRAFT ANGIOGRAPHY N/A 10/26/2018   Procedure: RIGHT/LEFT HEART CATH AND CORONARY/GRAFT ANGIOGRAPHY;  Surgeon: Martinique, Peter M, MD;  Location: [facility];  Service:  Cardiovascular;  Laterality: N/A;  . SHOULDER ARTHROSCOPY W/ SUBACROMIAL DECOMPRESSION AND DISTAL CLAVICLE EXCISION  10-11-2004   AND PARTIAL ROTATOR CUFF REPAIR  . TRANSTHORACIC ECHOCARDIOGRAM  23/76/2831   LV SYSTOLIC FUNCTION MILDLY REDUCED/ EF 48%/ LV FILLING PATTERN  IS IMPAIRED/ HYPOKINESIS IN THE MID AND BASALAR SEPTUM & ANTEROSEPTUM  . TURP VAPORIZATION  01-19-2010   BPH W/ BOO  . URETEROLITHOTOMY  1976    Current Medications: Current Outpatient Medications on File Prior to Visit  Medication Sig  . aspirin 81 MG tablet Take 81 mg by mouth daily at 12 noon.   Marland Kitchen atorvastatin (LIPITOR) 80 MG tablet Take 1 tablet (80 mg total) by mouth at bedtime.  . BD VEO INSULIN SYR ULTRAFINE 31G X 15/64" 1 ML MISC use as directed TO INJECT INSULIN  twice a day  . BRILINTA 90 MG TABS tablet Take 1 tablet (90 mg total) by mouth 2 (two) times daily.  . eszopiclone (LUNESTA) 2 MG TABS tablet Take 1 tablet (2 mg total) by mouth at bedtime as needed for sleep. Take immediately before bedtime  . finasteride (PROSCAR) 5 MG tablet Take 1 tablet (5 mg total) by mouth daily. (Patient taking differently: Take 5 mg by mouth daily at 12 noon. 12 noon)  . furosemide (LASIX) 80 MG tablet Take 1 tablet (80 mg total) by mouth daily. TAKE ON EMPTY STOMACH  . insulin NPH-regular Human (HUMULIN 70/30) (70-30) 100 UNIT/ML injection 90 units with breakfast, and 55 units with supper (Patient taking differently: Inject 40-70 Units into the skin See admin instructions. 80 mg in the morning and 40 mg in the evening)  . isosorbide mononitrate (IMDUR) 30 MG 24 hr tablet Take 1 tablet (30 mg total) by mouth daily.  . metoprolol succinate (TOPROL-XL) 50 MG 24 hr tablet Take 1 tablet (50 mg total) by mouth daily at 12 noon. Take with or immediately following a meal.  . nitroGLYCERIN (NITROSTAT) 0.4 MG SL tablet DISSOLVE 1 TABLET UNDER THE TONGUE EVERY 5 MINUTES AS NEEDED FOR CHEST PAIN. CALL MD/911 IF TAKE 2 DOSES. MAX 3 DOSES/DAY   . pantoprazole (PROTONIX) 40 MG tablet Take 1 tablet (40 mg total) by mouth 2 (two) times a day.  . potassium chloride SA (K-DUR) 20 MEQ tablet Take 1 tablet (20 mEq total) by mouth daily.  . sacubitril-valsartan (ENTRESTO) 49-51 MG Take 1 tablet by mouth 2 (two) times daily.  . sildenafil (REVATIO) 20 MG tablet Take 1 tablet (20 mg total) by mouth as needed. (Patient taking differently: Take 20 mg by mouth as needed (erectile dysfunction). )  . metFORMIN (GLUCOPHAGE) 1000 MG tablet Take 1 tablet (1,000 mg total) by mouth daily with breakfast. Follow=up appt is due must see provider for 90 day suppy (Patient not taking: Reported on 02/13/2019)   No current facility-administered medications on file prior to visit.      Allergies:   Patient has no known allergies.   Social History   Socioeconomic History  . Marital  status: Married    Spouse name: Not on file  . Number of children: 4  . Years of education: Not on file  . Highest education level: Not on file  Occupational History  . Not on file  Social Needs  . Financial resource strain: Not on file  . Food insecurity:    Worry: Not on file    Inability: Not on file  . Transportation needs:    Medical: Not on file    Non-medical: Not on file  Tobacco Use  . Smoking status: Never Smoker  . Smokeless tobacco: Never Used  Substance and Sexual Activity  . Alcohol use: Yes    Comment: Occ.   . Drug use: No  . Sexual activity: Not on file  Lifestyle  . Physical activity:    Days per week: Not on file    Minutes per session: Not on file  . Stress: Not on file  Relationships  . Social connections:    Talks on phone: Not on file    Gets together: Not on file    Attends religious service: Not on file    Active member of club or organization: Not on file    Attends meetings of clubs or organizations: Not on file    Relationship status: Not on file  Other Topics Concern  . Not on file  Social History Narrative   Lives in [place], [place], has family and friends in [place].     Family History: The patient's family history includes Diabetes in his father. There is no history of Stomach cancer, Colon cancer, Rectal cancer, Esophageal cancer, or Pancreatic cancer.  ROS:   Please see the history of present illness.  Additional pertinent ROS:  Constitutional: Negative for chills, fever, night sweats, unintentional weight loss  HENT: Negative for ear pain and hearing loss.   Eyes: Negative for loss of vision and eye pain.  Respiratory: Negative for cough, sputum, wheezing.   Cardiovascular: See HPI. Gastrointestinal: Negative for abdominal pain, melena, and hematochezia.  Genitourinary: Negative for dysuria and hematuria.  Musculoskeletal: Negative for falls and myalgias.  Skin: Negative for itching and rash.  Neurological: Negative for focal weakness, focal sensory changes and loss of consciousness.  Endo/Heme/Allergies: Does not bruise/bleed easily.    EKGs/Labs/Other Studies Reviewed:    The following studies were reviewed today: Echo 10/26/18 1. The left ventricle has mild-moderately reduced systolic function, with an ejection fraction of 40-45%. The cavity size was severely dilated. Left ventricular diastolic Doppler parameters are consistent with pseudonormalization Elevated left ventricular end-diastolic pressure.  2. Poor images preclude accurate assessment of wall motion even with definity.  3. The right ventricle has normal systolic function. The cavity was normal. There is no increase in right ventricular wall thickness.  4. Left atrial size was moderately dilated.  5. The mitral valve is normal in structure. Mitral valve regurgitation is moderate by color flow Doppler.  6. The tricuspid valve is normal in structure.  7. The aortic valve is tricuspid.  8. The pulmonic valve was normal in structure.  9. There is mild dilatation of the ascending aorta measuring 38 mm. 10. Right atrial pressure is estimated at  3 mmHg.  R/LHC 10/26/18  Mid LM to Dist LM lesion is 95% stenosed.  Prox LAD lesion is 100% stenosed.  Prox Cx lesion is 100% stenosed.  Prox RCA to Dist RCA lesion is 100% stenosed.  LV end diastolic pressure is normal.  Hemodynamic findings consistent with mild pulmonary  hypertension.   1. Occlusive native vessel CAD.     -95% distal left main    - 100% proximal LAD after the first septal perforator    - 100% LCx after a very small OM1.    - 100% proximal RCA 2. Patent LIMA to the LAD 3. Patent free radial graft arising from the mid LIMA graft and supplying the first diagonal and OM 4. SVG to RCA is known to be occluded. The distal RCA is supplied by collaterals.  5. Normal LV filling pressures 6. Mild pulmonary HTN. 7. Preserved cardiac output.  Plan: continue medical therapy for CHF. Will switch IV lasix to po. Stop IV Ntg and heparin.   EKG:  EKG is personally reviewed.  The ekg ordered 01/30/19 demonstrates NSR, iLBBB. Inferior/lateral ST changes unchanged from prior.  Recent Labs: 01/28/2019: Magnesium 1.7 01/30/2019: B Natriuretic Peptide 686.0 02/11/2019: ALT 16; BUN 52; Creatinine, Ser 2.29; Hemoglobin 12.6; Platelets 273.0; Potassium 4.8; Pro B Natriuretic peptide (BNP) 567.0; Sodium 137; TSH 1.74  Recent Lipid Panel    Component Value Date/Time   CHOL 177 01/29/2019 0541   TRIG 107 01/29/2019 0541   HDL 45 01/29/2019 0541   CHOLHDL 3.9 01/29/2019 0541   VLDL 21 01/29/2019 0541   LDLCALC 111 (H) 01/29/2019 0541   LDLDIRECT 155.0 07/13/2017 1033    Physical Exam:    VS:  BP 110/74   Pulse 77   Ht 6' (1.829 m)   Wt 239 lb 6.4 oz (108.6 kg)   SpO2 99%   BMI 32.47 kg/m     Wt Readings from Last 3 Encounters:  02/13/19 239 lb 6.4 oz (108.6 kg)  02/11/19 238 lb (108 kg)  01/31/19 248 lb (112.5 kg)   GEN: Well nourished, well developed in no acute distress HEENT: Normal NECK: No JVD; No carotid bruits LYMPHATICS: No lymphadenopathy CARDIAC: regular  rhythm, normal S1 and S2, no murmurs, rubs, gallops. Radial and DP pulses 2+ bilaterally. RESPIRATORY:  Clear to auscultation without rales, wheezing or rhonchi  ABDOMEN: Soft, non-tender, non-distended MUSCULOSKELETAL:  No edema; No deformity  SKIN: Warm and dry NEUROLOGIC:  Alert and oriented x 3 PSYCHIATRIC:  Normal affect   ASSESSMENT:    No diagnosis found. PLAN:    Chronic systolic and diastolic heart failure: Recent hospitalization for acute heart failure exacerbation as he ran out of medication. Has NYHA class II symptoms today. He had significant angina with HF exacerbation, likely due to elevated LVEDP. No edema today, weight stable, but remains short of breath with exertion. -difficult echo windows, but known to be 40-45% with grade 2 diastolic dysfunction; due to ischemic cardiomyopathy -continue metoprolol succinate 50 mg daily -continue entresto 49-51 mg BID -continue lasix 80 mg as needed and potassium when he takes lasix. His recent kidney functioned worsened, plans to follow with PCP (or we can check here) -educated on heart failure, including daily weights, sodium and fluid guidelines, signs/symptoms to watch for, diet and activity recommendations -medication reconciliation done, all meds recently refilled. Counseled to call office if he has any issues -increasing imdur today, limited ability to increase other medications given recent bump in Cr.  Hyperlipidemia: -on atorvastatin 80 mg, with LDL goal <70.  -lipids 01/29/19 with LDL 111 -have discussed PCSK9 inhibitors in the past. Cost is a concern, and previously we balanced starting entresto vs. PCSK9i. Continue to address. Similar discussion re: vascepa.  CAD s/p CABG 2006: known SVG-PDA is occluded, other grafts are patent.  -on aspirin 81  mg -on ticagrelor 90 mg BID until at least 10/2019 given NSTEMI -on metoprolol succinate 50 mg daily -increasing imdur today to see if this helps his exercise tolerance. Has  angina with increased LVEDP in the past -has sublingual nitroglycerin ordered.  -counseled on life threatening hypotension with isosorbide, nitroglycerin and sildenafil. Would not use sildenafil given this.  Type II diabetes, on insulin and metformin, with chronic kidney disease stage 3. Most recent G2R 8.3 -trulicity was too expensive for him -would like to use jardiance given CKD, obesity, and CAD, though concerned about cost. Will need to continue to prioritize medications so that they are affordable and allow adherece.  Plan for follow up: 2 weeks to monitor symptoms, review labs, follow up on medications. He is at risk for HF, working to optimize.  Medication Adjustments/Labs and Tests Ordered: Current medicines are reviewed at length with the patient today.  Concerns regarding medicines are outlined above.  No orders of the defined types were placed in this encounter.  Meds ordered this encounter  Medications  . furosemide (LASIX) 80 MG tablet    Sig: Take 1 tablet (80 mg total) by mouth daily as needed (If weight increases 3 lbs in one day or 5 lbs in one week). TAKE ON EMPTY STOMACH    Dispense:  90 tablet    Refill:  3  . isosorbide mononitrate (IMDUR) 60 MG 24 hr tablet    Sig: Take 1 tablet (60 mg total) by mouth daily.    Dispense:  90 tablet    Refill:  3    Patient Instructions  Medication Instructions:  Increase: Imdur 60 mg daily   If you gain 3 pounds overnight or 5 pounds in a week, take a dose of furosemide. You can take one dose daily. If your weight does not come back down to your normal within three days, please call the office.   If you need a refill on your cardiac medications before your next appointment, please call your pharmacy.   Lab work Your physician recommends that you have lab work done with PCP  If you have labs (blood work) drawn today and your tests are completely normal, you will receive your results only by: Marland Kitchen MyChart Message (if you have  MyChart) OR . A paper copy in the mail If you have any lab test that is abnormal or we need to change your treatment, we will call you to review the results.  Testing/Procedures: None  Follow-Up: At [facility], you and your health needs are our priority.  As part of our continuing mission to provide you with exceptional heart care, we have created designated Provider Care Teams.  These Care Teams include your primary Cardiologist (physician) and Advanced Practice Providers (APPs -  Physician Assistants and Nurse Practitioners) who all work together to provide you with the care you need, when you need it. You will need a follow up appointment in 2 weeks.  Please call our office 2 months in advance to schedule this appointment.  You may see Buford Dresser, MD or one of the following Advanced Practice Providers on your designated Care Team:   Rosaria Ferries, PA-C . Jory Sims, DNP, ANP    Do the following things EVERY DAY:  1) Weigh yourself EVERY morning after you go to the bathroom but before you eat or drink anything. Write this number down in a weight log/diary. If you gain 3 pounds overnight or 5 pounds in a week, take a dose of  furosemide. You can take one dose daily. If your weight does not come back down to your normal within three days, please call the office.  2) Take your medicines as prescribed. If you have concerns about your medications, please call us before you stop taking them.   3) Eat low salt foods-Limit salt (sodium) to 2000 mg per day. This will help prevent your body from holding onto fluid. Read food labels as many processed foods have a lot of sodium, especially canned goods and prepackaged meats. If you would like some assistance choosing low sodium foods, we would be happy to set you up with a nutritionist.  4) Stay as active as you can everyday. Staying active will give you more energy and make your muscles stronger. Start with 5 minutes at a time and  work your way up to 30 minutes a day. Break up your activities--do some in the morning and some in the afternoon. Start with 3 days per week and work your way up to 5 days as you can.  If you have chest pain, feel short of breath, dizzy, or lightheaded, STOP. If you don't feel better after a short rest, call 911. If you do feel better, call the office to let us know you have symptoms with exercise.  5) Limit all fluids for the day to less than 2 liters. Fluid includes all drinks, coffee, juice, ice chips, soup, jello, and all other liquids.     Signed, Buford Dresser, MD PhD 02/13/2019   [facility]

## 2019-02-20 ENCOUNTER — Encounter: Payer: Self-pay | Admitting: Cardiology

## 2019-02-28 ENCOUNTER — Encounter: Payer: Self-pay | Admitting: Cardiology

## 2019-02-28 ENCOUNTER — Telehealth (INDEPENDENT_AMBULATORY_CARE_PROVIDER_SITE_OTHER): Payer: Medicare Other | Admitting: Cardiology

## 2019-02-28 VITALS — Ht 72.0 in | Wt 238.0 lb

## 2019-02-28 DIAGNOSIS — Z951 Presence of aortocoronary bypass graft: Secondary | ICD-10-CM

## 2019-02-28 DIAGNOSIS — Z7189 Other specified counseling: Secondary | ICD-10-CM

## 2019-02-28 DIAGNOSIS — Z794 Long term (current) use of insulin: Secondary | ICD-10-CM

## 2019-02-28 DIAGNOSIS — I5022 Chronic systolic (congestive) heart failure: Secondary | ICD-10-CM

## 2019-02-28 DIAGNOSIS — E1122 Type 2 diabetes mellitus with diabetic chronic kidney disease: Secondary | ICD-10-CM

## 2019-02-28 DIAGNOSIS — E782 Mixed hyperlipidemia: Secondary | ICD-10-CM

## 2019-02-28 DIAGNOSIS — I5042 Chronic combined systolic (congestive) and diastolic (congestive) heart failure: Secondary | ICD-10-CM | POA: Diagnosis not present

## 2019-02-28 DIAGNOSIS — I255 Ischemic cardiomyopathy: Secondary | ICD-10-CM | POA: Diagnosis not present

## 2019-02-28 DIAGNOSIS — I10 Essential (primary) hypertension: Secondary | ICD-10-CM

## 2019-02-28 DIAGNOSIS — I25119 Atherosclerotic heart disease of native coronary artery with unspecified angina pectoris: Secondary | ICD-10-CM

## 2019-02-28 DIAGNOSIS — N183 Chronic kidney disease, stage 3 (moderate): Secondary | ICD-10-CM

## 2019-02-28 MED ORDER — ISOSORBIDE MONONITRATE ER 120 MG PO TB24: 120.0000 mg | ORAL_TABLET | Freq: Every day | ORAL | 3 refills | Status: DC

## 2019-02-28 MED ORDER — POTASSIUM CHLORIDE CRYS ER 20 MEQ PO TBCR: 20.0000 meq | EXTENDED_RELEASE_TABLET | Freq: Every day | ORAL | 1 refills | Status: DC | PRN

## 2019-02-28 NOTE — Progress Notes (Signed)
Virtual Visit via Telephone Note   This visit type was conducted due to national recommendations for restrictions regarding the COVID-19 Pandemic (e.g. social distancing) in an effort to limit this patient's exposure and mitigate transmission in our community.  Due to his co-morbid illnesses, this patient is at least at moderate risk for complications without adequate follow up.  This format is felt to be most appropriate for this patient at this time.  The patient did not have access to video technology/had technical difficulties with video requiring transitioning to audio format only (telephone).  All issues noted in this document were discussed and addressed.  No physical exam could be performed with this format.  Please refer to the patient's chart for his  consent to telehealth for [facility].   Date:  02/28/2019   ID:  [name], DOB [birth year], MRN [id number]  Patient Location: Home Provider Location: Home  PCP:  Binnie Rail, MD  Cardiologist:  Buford Dresser, MD  Electrophysiologist:  None   Evaluation Performed:  Follow-Up Visit  Chief Complaint:  Follow up  History of Present Illness:    Russell Trevino is a 68 y.o. male with a hx of CAD s/p prior CABG, recent NSTEMI, ischemic cardiomyopathy with EF 40-45%.  The patient does not have symptoms concerning for COVID-19 infection (fever, chills, cough, or new shortness of breath).   Today: Avoiding virus risk. Feels better than last visit, though still with stable exertional angina. Tolerating imdur, got better with increased dose. Able to walk about a mile before he gets chest burning. Last bp 3 days ago, 124/75.  Wt 238, higher than post discharge but similar to prior home weight. Took 1 lasix pill since last visit after 3 lb weight gain, has returned to normal now. Denies shortness of breath at rest or with normal exertion, does have some when he pushes himself. No PND, orthopnea, LE edema or unexpected  weight gain. No syncope or palpitations.  In [facility] currently, no plans to come to [facility] until his appt with Dr. Quay Burow in July. Plans to have his kidney function rechecked at that time. We reviewed his lab results that his kidney function had worsened on that test compared to prior, and that we usually recheck to make sure it is going in the right direction. He feels well, no complaints, and prefers to wait until July.  We did a medication reconciliation. Again we discussed risk of sildenafil and nitrates. He is not taking sildenafil, will remove from med list.  Past Medical History:  Diagnosis Date   BPH (benign prostatic hypertrophy)    Cardiomyopathy, ischemic    Coronary artery disease    at [facility], [place] 2009 (GRAFTS PATENT), cath 05/2018 w/ SVG-PDA 100%>>med rx   Diabetes mellitus, type 2 (HCC)    Frequency of urination    GERD (gastroesophageal reflux disease)    Gross hematuria    History of CHF (congestive heart failure) 2007   History of kidney stones    History of myocardial infarction 2006, 2019   2006>>CABG, 2019 cath w/ med rx   History of urinary retention    Hyperlipidemia    Nocturia    Renal calculi    BILATERAL PER CT SCAN--  NON-OBSTRUCTIVE   S/P CABG x 4 2006   Past Surgical History:  Procedure Laterality Date   CARDIAC CATHETERIZATION  01/17/2008   GRAFTS PATENT / EF 40%,  DR SANTOS ([facility]TIST)   CORONARY ARTERY BYPASS GRAFT  04/2005  LIMA-LAD, L radial-OM-D1 and SVG-PDA   CYSTO / RIGHT URETERAL STENT PLACEMENT  10-08-1999   CYSTO/  REMOVAL BLADDER STONES  01-14-2010   CYSTOSCOPY  10/16/2012   Procedure: CYSTOSCOPY;  Surgeon: Malka So, MD;  Location: [facility];  Service: Urology;  Laterality: N/A;  Cystoscopy with Clot Evacuation and fulguration bladder neck.   EXTRACORPOREAL SHOCK WAVE LITHOTRIPSY  10-15-2009   RIGHT   KNEE ARTHROSCOPY  1978   RIGHT   LEFT SHOULDER SURGERY   2002   LUMBAR FUSION  09-10-2008    L4 - L5   PERCUTANEOUS NEPHROSTOLITHOTOMY  1979   PROSTATE BIOPSY  10/16/2012   Procedure: PROSTATE BIOPSY;  Surgeon: Malka So, MD;  Location: [facility];  Service: Urology;  Laterality: N/A;  Through ultrasound.   RIGHT/LEFT HEART CATH AND CORONARY/GRAFT ANGIOGRAPHY N/A 10/26/2018   Procedure: RIGHT/LEFT HEART CATH AND CORONARY/GRAFT ANGIOGRAPHY;  Surgeon: Martinique, Peter M, MD;  Location: [facility];  Service: Cardiovascular;  Laterality: N/A;   SHOULDER ARTHROSCOPY W/ SUBACROMIAL DECOMPRESSION AND DISTAL CLAVICLE EXCISION  10-11-2004   AND PARTIAL ROTATOR CUFF REPAIR   TRANSTHORACIC ECHOCARDIOGRAM  72/53/6644   LV SYSTOLIC FUNCTION MILDLY REDUCED/ EF 48%/ LV FILLING PATTERN  IS IMPAIRED/ HYPOKINESIS IN THE MID AND BASALAR SEPTUM & ANTEROSEPTUM   TURP VAPORIZATION  01-19-2010   BPH W/ BOO   URETEROLITHOTOMY  1976     Current Meds  Medication Sig   aspirin 81 MG tablet Take 81 mg by mouth daily at 12 noon.    atorvastatin (LIPITOR) 80 MG tablet Take 1 tablet (80 mg total) by mouth at bedtime.   BD VEO INSULIN SYR ULTRAFINE 31G X 15/64" 1 ML MISC use as directed TO INJECT INSULIN  twice a day   BRILINTA 90 MG TABS tablet Take 1 tablet (90 mg total) by mouth 2 (two) times daily.   eszopiclone (LUNESTA) 2 MG TABS tablet Take 1 tablet (2 mg total) by mouth at bedtime as needed for sleep. Take immediately before bedtime   finasteride (PROSCAR) 5 MG tablet Take 1 tablet (5 mg total) by mouth daily. (Patient taking differently: Take 5 mg by mouth daily at 12 noon. 12 noon)   furosemide (LASIX) 80 MG tablet Take 1 tablet (80 mg total) by mouth daily as needed (If weight increases 3 lbs in one day or 5 lbs in one week). TAKE ON EMPTY STOMACH   insulin NPH-regular Human (HUMULIN 70/30) (70-30) 100 UNIT/ML injection 90 units with breakfast, and 55 units with supper (Patient taking differently: Inject 40-70 Units into the skin See admin instructions. 80 mg in the  morning and 40 mg in the evening)   isosorbide mononitrate (IMDUR) 60 MG 24 hr tablet Take 1 tablet (60 mg total) by mouth daily.   metFORMIN (GLUCOPHAGE) 1000 MG tablet Take 1 tablet (1,000 mg total) by mouth daily with breakfast. Follow=up appt is due must see provider for 90 day suppy   metoprolol succinate (TOPROL-XL) 50 MG 24 hr tablet Take 1 tablet (50 mg total) by mouth daily at 12 noon. Take with or immediately following a meal.   nitroGLYCERIN (NITROSTAT) 0.4 MG SL tablet DISSOLVE 1 TABLET UNDER THE TONGUE EVERY 5 MINUTES AS NEEDED FOR CHEST PAIN. CALL MD/911 IF TAKE 2 DOSES. MAX 3 DOSES/DAY   pantoprazole (PROTONIX) 40 MG tablet Take 1 tablet (40 mg total) by mouth 2 (two) times a day.   potassium chloride SA (K-DUR) 20 MEQ tablet Take 1  tablet (20 mEq total) by mouth daily.   sacubitril-valsartan (ENTRESTO) 49-51 MG Take 1 tablet by mouth 2 (two) times daily.   sildenafil (REVATIO) 20 MG tablet Take 1 tablet (20 mg total) by mouth as needed. (Patient taking differently: Take 20 mg by mouth as needed (erectile dysfunction). )     Allergies:   Patient has no known allergies.   Social History   Tobacco Use   Smoking status: Never Smoker   Smokeless tobacco: Never Used  Substance Use Topics   Alcohol use: Yes    Comment: Occ.    Drug use: No     Family Hx: The patient's family history includes Diabetes in his father. There is no history of Stomach cancer, Colon cancer, Rectal cancer, Esophageal cancer, or Pancreatic cancer.  ROS:   Please see the history of present illness.    Constitutional: Negative for chills, fever, night sweats, unintentional weight loss  HENT: Negative for ear pain and hearing loss.   Eyes: Negative for loss of vision and eye pain.  Respiratory: Negative for cough, sputum, wheezing.   Cardiovascular: See HPI. Gastrointestinal: Negative for abdominal pain, melena, and hematochezia.  Genitourinary: Negative for dysuria and hematuria.    Musculoskeletal: Negative for falls and myalgias.  Skin: Negative for itching and rash.  Neurological: Negative for focal weakness, focal sensory changes and loss of consciousness.  Endo/Heme/Allergies: Does not bruise/bleed easily.  All other systems reviewed and are negative.   Prior CV studies:   The following studies were reviewed today: Echo 10/26/18 1. The left ventricle has mild-moderately reduced systolic function, with an ejection fraction of 40-45%. The cavity size was severely dilated. Left ventricular diastolic Doppler parameters are consistent with pseudonormalization Elevated left ventricular end-diastolic pressure. 2. Poor images preclude accurate assessment of wall motion even with definity. 3. The right ventricle has normal systolic function. The cavity was normal. There is no increase in right ventricular wall thickness. 4. Left atrial size was moderately dilated. 5. The mitral valve is normal in structure. Mitral valve regurgitation is moderate by color flow Doppler. 6. The tricuspid valve is normal in structure. 7. The aortic valve is tricuspid. 8. The pulmonic valve was normal in structure. 9. There is mild dilatation of the ascending aorta measuring 38 mm. 10. Right atrial pressure is estimated at 3 mmHg.  R/LHC 10/26/18  Mid LM to Dist LM lesion is 95% stenosed.  Prox LAD lesion is 100% stenosed.  Prox Cx lesion is 100% stenosed.  Prox RCA to Dist RCA lesion is 100% stenosed.  LV end diastolic pressure is normal.  Hemodynamic findings consistent with mild pulmonary hypertension.  1. Occlusive native vessel CAD.  -95% distal left main - 100% proximal LAD after the first septal perforator - 100% LCx after a very small OM1. - 100% proximal RCA 2. Patent LIMA to the LAD 3. Patent free radial graft arising from the mid LIMA graft and supplying the first diagonal and OM 4. SVG to RCA is known to be occluded. The distal RCA is supplied  by collaterals.  5. Normal LV filling pressures 6. Mild pulmonary HTN. 7. Preserved cardiac output.  Plan: continue medical therapy for CHF. Will switch IV lasix to po. Stop IV Ntg and heparin.   Labs/Other Tests and Data Reviewed:    EKG:  An ECG dated 01/30/19 was personally reviewed today and demonstrated:  NSR, iLBBB, unchanged inferior/lateral ST segments  Recent Labs: 01/28/2019: Magnesium 1.7 01/30/2019: B Natriuretic Peptide 686.0 02/11/2019: ALT 16; BUN  52; Creatinine, Ser 2.29; Hemoglobin 12.6; Platelets 273.0; Potassium 4.8; Pro B Natriuretic peptide (BNP) 567.0; Sodium 137; TSH 1.74   Recent Lipid Panel Lab Results  Component Value Date/Time   CHOL 177 01/29/2019 05:41 AM   TRIG 107 01/29/2019 05:41 AM   HDL 45 01/29/2019 05:41 AM   CHOLHDL 3.9 01/29/2019 05:41 AM   LDLCALC 111 (H) 01/29/2019 05:41 AM   LDLDIRECT 155.0 07/13/2017 10:33 AM    Wt Readings from Last 3 Encounters:  02/28/19 238 lb (108 kg)  02/13/19 239 lb 6.4 oz (108.6 kg)  02/11/19 238 lb (108 kg)     Objective:    Vital Signs:  Ht 6' (1.829 m)    Wt 238 lb (108 kg)    BMI 32.28 kg/m    Speaking comfortably on the phone, in no acute distress  ASSESSMENT & PLAN:    Chronic systolic and diastolic heart failure:  -recent hospitalization for exacerbation 2/2 not having medications -NYHA class II -denies edema, PND, orthopnea, but weight up slightly compared to prior. Counseled that he needs to watch this closely to avoid rehospitalization -difficult echo windows, but known to be 40-45% with grade 2 diastolic dysfunction; due to ischemic cardiomyopathy -continue metoprolol succinate 50 mg daily -continue entresto 49-51 mg BID -continue lasix 80 mg as needed and potassium when he takes lasix.  -reviewed his recent lab results, discussed need to monitor renal function. He is in [place], plans to follow with PCP in July -educated on heart failure, including daily weights, sodium and fluid  guidelines, signs/symptoms to watch for, diet and activity recommendations -increasing imdur again today, limited ability to increase other medications given recent bump in Cr.  Hyperlipidemia: not at goal -on atorvastatin 80 mg, with LDL goal <70.  -lipids 01/29/19 with LDL 111 -have discussed PCSK9 inhibitors in the past. Cost is a concern, and previously we balanced starting entresto vs. PCSK9i. Continue to address. Similar discussion re: vascepa.  CAD s/p CABG 2006: known SVG-PDA is occluded, other grafts are patent.  -on aspirin 81 mg -on ticagrelor 90 mg BID until at least 10/2019 given NSTEMI -on metoprolol succinate 50 mg daily -increasing imdur again today to see if this helps his exercise tolerance. Did have some improvement with increasing dose at last visit -has sublingual nitroglycerin ordered.  -counseled on life threatening hypotension with isosorbide, nitroglycerin and sildenafil. Would not use sildenafil given this. Removed from his medication list as he is not using.  Type II diabetes, on insulin and metformin, with chronic kidney disease stage 3. Most recent F5D 8.3 -trulicity was too expensive for him -would like to use jardiance given CKD, obesity, and CAD, though concerned about cost. Will need to continue to prioritize medications so that they are affordable and allow adherece.  Hypertension: last BP 3 days ago at goal -Continue imdur, metoprolol, entresto  COVID-19 Education: The signs and symptoms of COVID-19 were discussed with the patient and how to seek care for testing (follow up with PCP or arrange E-visit).  The importance of social distancing was discussed today.  Time:   Today, I have spent 18 minutes with the patient with telehealth technology discussing the above problems.    Patient Instructions  Medication Instructions:  Increase: Imdur 120 mg daily Stop: Sildenafil Take: Potassium 20 meq as needed with when you take your lasix dose  If you  need a refill on your cardiac medications before your next appointment, please call your pharmacy.   Lab work: None  Testing/Procedures: None  Follow-Up: At [facility], you and your health needs are our priority.  As part of our continuing mission to provide you with exceptional heart care, we have created designated Provider Care Teams.  These Care Teams include your primary Cardiologist (physician) and Advanced Practice Providers (APPs -  Physician Assistants and Nurse Practitioners) who all work together to provide you with the care you need, when you need it. You will need a follow up appointment in 3 months.  Please call our office 2 months in advance to schedule this appointment.  You may see Buford Dresser, MD or one of the following Advanced Practice Providers on your designated Care Team:   Rosaria Ferries, PA-C  Jory Sims, DNP, ANP      Medication Adjustments/Labs and Tests Ordered: Current medicines are reviewed at length with the patient today.  Concerns regarding medicines are outlined above.   Tests Ordered: No orders of the defined types were placed in this encounter.   Medication Changes: Meds ordered this encounter  Medications   isosorbide mononitrate (IMDUR) 120 MG 24 hr tablet    Sig: Take 1 tablet (120 mg total) by mouth daily.    Dispense:  90 tablet    Refill:  3    Please wait to fill until patient calls   potassium chloride SA (K-DUR) 20 MEQ tablet    Sig: Take 1 tablet (20 mEq total) by mouth daily as needed (when you take lasix).    Dispense:  90 tablet    Refill:  1    Follow Up:  3 mos  Signed, Buford Dresser, MD  02/28/2019  [facility]

## 2019-02-28 NOTE — Patient Instructions (Addendum)
Medication Instructions:  Increase: Imdur 120 mg daily Stop: Sildenafil Take: Potassium 20 meq as needed with when you take your lasix dose  If you need a refill on your cardiac medications before your next appointment, please call your pharmacy.   Lab work: None  Testing/Procedures: None  Follow-Up: At [facility], you and your health needs are our priority.  As part of our continuing mission to provide you with exceptional heart care, we have created designated Provider Care Teams.  These Care Teams include your primary Cardiologist (physician) and Advanced Practice Providers (APPs -  Physician Assistants and Nurse Practitioners) who all work together to provide you with the care you need, when you need it. You will need a follow up appointment in 3 months.  Please call our office 2 months in advance to schedule this appointment.  You may see Buford Dresser, MD or one of the following Advanced Practice Providers on your designated Care Team:   Rosaria Ferries, PA-C . Jory Sims, DNP, ANP

## 2019-03-04 ENCOUNTER — Encounter: Payer: Self-pay | Admitting: Cardiology

## 2019-03-04 DIAGNOSIS — I25119 Atherosclerotic heart disease of native coronary artery with unspecified angina pectoris: Secondary | ICD-10-CM | POA: Insufficient documentation

## 2019-04-08 ENCOUNTER — Telehealth: Payer: Self-pay | Admitting: Internal Medicine

## 2019-04-08 MED ORDER — NITROGLYCERIN 0.4 MG SL SUBL: SUBLINGUAL_TABLET | SUBLINGUAL | 3 refills | Status: DC

## 2019-04-08 NOTE — Telephone Encounter (Signed)
Copied from [place] [id number]. Topic: Quick Communication - Rx Refill/Question >> Apr 08, 2019  2:26 PM Leward Quan A wrote: Medication: nitroGLYCERIN (NITROSTAT) 0.4 MG SL tablet   Per patient he is completely out of this medication and need it went all weekend without  Has the patient contacted their pharmacy? Yes.   (Agent: If no, request that the patient contact the pharmacy for the refill.) (Agent: If yes, when and what did the pharmacy advise?)  Preferred Pharmacy (with phone number or street name): [facility] [phone] - [place], [place] - [place] [phone] (Phone) [phone] (Fax)    Agent: Please be advised that RX refills may take up to 3 business days. We ask that you follow-up with your pharmacy.

## 2019-04-08 NOTE — Telephone Encounter (Signed)
Rx sent 

## 2019-04-08 NOTE — Telephone Encounter (Signed)
Ok to send

## 2019-04-08 NOTE — Telephone Encounter (Signed)
Just want to make sure. Are there any concerns that he is needing this medication and needed it this weekend? Will send to pharmacy is no concerns.

## 2019-04-24 ENCOUNTER — Other Ambulatory Visit: Payer: Self-pay

## 2019-04-24 NOTE — Patient Outreach (Signed)
[facility]) Care Management  04/24/2019  [name] [birth year] [id number]   Medication Adherence call to Mr. Russell Trevino [facility] Identifiers Verify spoke with patient he is past due on Metformin 1000 mg patient explain he is no longer taking this medication doctor took him off.Russell Trevino is showing past due under [name].   [place] Management Direct Dial [phone]  Fax [phone] [name]

## 2019-05-09 ENCOUNTER — Telehealth: Payer: Self-pay | Admitting: Cardiology

## 2019-05-09 NOTE — Telephone Encounter (Signed)
New Message   Pt c/o medication issue:  1. Name of Medication: isosorbide mononitrate (IMDUR) 120 MG 24 hr tablet   2. How are you currently taking this medication (dosage and times per day)? Take 1 tablet (120 mg total) by mouth daily.  3. Are you having a reaction (difficulty breathing--STAT)?   4. What is your medication issue? Patient is calling because he feels that he is not getting enough oxygen. He wants to know does he need to increase his dosage or maybe be placed on something else. Please call.

## 2019-05-09 NOTE — Telephone Encounter (Signed)
Pt updated and voiced understanding. Appointment scheduled for 8/31 at 8:40 am.

## 2019-05-09 NOTE — Telephone Encounter (Signed)
I would recommend bringing him in for an appointment, as this is difficult to determine without examining him. He can either see myself or an APP for further evaluation. Thank you.

## 2019-05-09 NOTE — Telephone Encounter (Signed)
Called patient he states that he is still having to use the nitroglycerin, still using 1-2 a day.  He states that he feels that he is not getting enough oxygen, he becomes short of breath, he denies chest 'pains', denies swelling in hands/feet.   He states that he can tell some different with the increase in Imdur, but he is still having issues as before just 'not as bad'  I advised patient that normally nitro is used for chest pains, but he states when he takes it, it helps with his breathing; because he is not having actual chest pains. Patient does not know what else to do at this time, he just does what he thinks at the moment that will help.  Patient does not check BP or HR at home at this time.   Advised would route to MD to advise.

## 2019-05-11 NOTE — Progress Notes (Signed)
Subjective:    Patient ID: Russell Trevino, male    DOB: 27-Aug-1951, 68 y.o.   MRN: [id number]  HPI The patient is here for follow up.  He is not exercising regularly.   He is limited by his shortness of breath, but would like to start exercising regularly.  If he does any exercise or goes up steps he gets SOB.  Taking the NTG helps.  Cardiology just started him on Ranexa in hopes that he will not need a nitroglycerin..    CAD, combined systolic and diastolic HF, Hypertension: He is taking his medication daily.  He just saw cardiology and some of his medications were changed.  He is compliant with a low sodium diet.  He denies palpitations, edema and regular headaches.  He does not monitor his blood pressure at home.    Hyperlipidemia: He is taking his medication daily. He is compliant with a low fat/cholesterol diet. He denies myalgias.   Diabetes: He was following with Dr Loanne Drilling, has not seen him in a while.  He stopped taking his evening insulin because his morning sugars were on the low side.  His sugars have been higher than ideal..  He is taking his medication daily as prescribed. He is compliant with a diabetic diet.   He checks his feet daily and denies foot lesions.  He denies any numbness/tingling in his feet.  He is up-to-date with an ophthalmology examination.   CKD:  He drinks a lot of watered down tea.  He cannot drink plain water.  He does not take any NSAIDs.  GERD:  He is taking his medication daily as prescribed.  He denies any GERD symptoms and feels his GERD is well controlled.    Sleep difficulties:  He is not sleeping.  He sometimes has difficult falling asleep, but usually he wakes soon after falling asleep. He wakes up with chest pain.  If he takes a NTG he can go back to sleep.  Often he was taking the nitroglycerin before he went to sleep.  He sometimes just wakes up.  We did try Lunesta 2 mg, but that was not effective.   Medications and allergies reviewed  with patient and updated if appropriate.  Patient Active Problem List   Diagnosis Date Noted  . Coronary artery disease involving native coronary artery of native heart with angina pectoris ([place]) 03/04/2019  . Sleep difficulties 02/11/2019  . CKD stage 3 due to type 2 diabetes mellitus ([place]) 01/28/2019  . Ischemic cardiomyopathy 11/05/2018  . Chronic combined systolic and diastolic heart failure ([place]) 11/05/2018  . Type 2 diabetes mellitus with stage 3 chronic kidney disease, with long-term current use of insulin ([place]) 08/26/2017  . GERD (gastroesophageal reflux disease) 07/13/2017  . Cough 08/17/2016  . Thoracic back pain 06/21/2016  . Hx of CABG 05/26/2016  . Prostate cancer ([place]) 05/26/2016  . Essential hypertension, benign 05/26/2016  . Mixed hyperlipidemia 05/26/2016  . Coronary artery disease due to calcified coronary lesion 03/25/2013    Current Outpatient Medications on File Prior to Visit  Medication Sig Dispense Refill  . amLODipine (NORVASC) 5 MG tablet Take 1 tablet (5 mg total) by mouth daily. 90 tablet 3  . aspirin 81 MG tablet Take 81 mg by mouth daily at 12 noon.     Marland Kitchen atorvastatin (LIPITOR) 80 MG tablet Take 1 tablet (80 mg total) by mouth at bedtime. 90 tablet 3  . BD VEO INSULIN SYR ULTRAFINE 31G X 15/64" 1 ML  MISC use as directed TO INJECT INSULIN  twice a day 10 each 0  . clopidogrel (PLAVIX) 75 MG tablet Take 1 tablet (75 mg total) by mouth daily. 90 tablet 3  . finasteride (PROSCAR) 5 MG tablet Take 1 tablet (5 mg total) by mouth daily. (Patient taking differently: Take 5 mg by mouth daily at 12 noon. 12 noon) 90 tablet 1  . furosemide (LASIX) 80 MG tablet Take 1 tablet (80 mg total) by mouth daily as needed (If weight increases 3 lbs in one day or 5 lbs in one week). TAKE ON EMPTY STOMACH 90 tablet 3  . isosorbide mononitrate (IMDUR) 120 MG 24 hr tablet Take 1 tablet (120 mg total) by mouth daily. 90 tablet 3  . metFORMIN (GLUCOPHAGE) 1000 MG tablet Take 1 tablet  (1,000 mg total) by mouth daily with breakfast. Follow=up appt is due must see provider for 90 day suppy 30 tablet 0  . metoprolol succinate (TOPROL-XL) 50 MG 24 hr tablet Take 1 tablet (50 mg total) by mouth daily at 12 noon. Take with or immediately following a meal. 90 tablet 3  . nitroGLYCERIN (NITROSTAT) 0.4 MG SL tablet DISSOLVE 1 TABLET UNDER THE TONGUE EVERY 5 MINUTES AS NEEDED FOR CHEST PAIN. CALL MD/911 IF TAKE 2 DOSES. MAX 3 DOSES/DAY 100 tablet 3  . pantoprazole (PROTONIX) 40 MG tablet Take 1 tablet (40 mg total) by mouth 2 (two) times a day. 90 tablet 3  . potassium chloride SA (K-DUR) 20 MEQ tablet Take 1 tablet (20 mEq total) by mouth daily as needed (when you take lasix). 90 tablet 1  . ranolazine (RANEXA) 500 MG 12 hr tablet Take 1 tablet (500 mg total) by mouth 2 (two) times daily. 180 tablet 3  . sacubitril-valsartan (ENTRESTO) 49-51 MG Take 1 tablet by mouth 2 (two) times daily. 60 tablet 11   No current facility-administered medications on file prior to visit.     Past Medical History:  Diagnosis Date  . BPH (benign prostatic hypertrophy)   . Cardiomyopathy, ischemic   . Coronary artery disease    at [facility], [place] 2009 (GRAFTS PATENT), cath 05/2018 w/ SVG-PDA 100%>>med rx  . Diabetes mellitus, type 2 ([place])   . Frequency of urination   . GERD (gastroesophageal reflux disease)   . Gross hematuria   . History of CHF (congestive heart failure) 2007  . History of kidney stones   . History of myocardial infarction 2006, 2019   2006>>CABG, 2019 cath w/ med rx  . History of urinary retention   . Hyperlipidemia   . Nocturia   . Renal calculi    BILATERAL PER CT SCAN--  NON-OBSTRUCTIVE  . S/P CABG x 4 2006    Past Surgical History:  Procedure Laterality Date  . CARDIAC CATHETERIZATION  01/17/2008   GRAFTS PATENT / EF 40%,  DR SANTOS ([facility])  . CORONARY ARTERY BYPASS GRAFT  04/2005   LIMA-LAD, L radial-OM-D1 and SVG-PDA  . CYSTO / RIGHT URETERAL STENT PLACEMENT   10-08-1999  . CYSTO/  REMOVAL BLADDER STONES  01-14-2010  . CYSTOSCOPY  10/16/2012   Procedure: CYSTOSCOPY;  Surgeon: Malka So, MD;  Location: [facility];  Service: Urology;  Laterality: N/A;  Cystoscopy with Clot Evacuation and fulguration bladder neck.  Marland Kitchen EXTRACORPOREAL SHOCK WAVE LITHOTRIPSY  10-15-2009   RIGHT  . KNEE ARTHROSCOPY  1978   RIGHT  . LEFT SHOULDER SURGERY   2002  . LUMBAR FUSION  09-10-2008   L4 -  L5  . PERCUTANEOUS NEPHROSTOLITHOTOMY  1979  . PROSTATE BIOPSY  10/16/2012   Procedure: PROSTATE BIOPSY;  Surgeon: Malka So, MD;  Location: [facility];  Service: Urology;  Laterality: N/A;  Through ultrasound.  Marland Kitchen RIGHT/LEFT HEART CATH AND CORONARY/GRAFT ANGIOGRAPHY N/A 10/26/2018   Procedure: RIGHT/LEFT HEART CATH AND CORONARY/GRAFT ANGIOGRAPHY;  Surgeon: Martinique, Peter M, MD;  Location: [facility];  Service: Cardiovascular;  Laterality: N/A;  . SHOULDER ARTHROSCOPY W/ SUBACROMIAL DECOMPRESSION AND DISTAL CLAVICLE EXCISION  10-11-2004   AND PARTIAL ROTATOR CUFF REPAIR  . TRANSTHORACIC ECHOCARDIOGRAM  56/43/3295   LV SYSTOLIC FUNCTION MILDLY REDUCED/ EF 48%/ LV FILLING PATTERN  IS IMPAIRED/ HYPOKINESIS IN THE MID AND BASALAR SEPTUM & ANTEROSEPTUM  . TURP VAPORIZATION  01-19-2010   BPH W/ BOO  . URETEROLITHOTOMY  1976    Social History   Socioeconomic History  . Marital status: Married    Spouse name: Not on file  . Number of children: 4  . Years of education: Not on file  . Highest education level: Not on file  Occupational History  . Not on file  Social Needs  . Financial resource strain: Not on file  . Food insecurity    Worry: Not on file    Inability: Not on file  . Transportation needs    Medical: Not on file    Non-medical: Not on file  Tobacco Use  . Smoking status: Never Smoker  . Smokeless tobacco: Never Used  Substance and Sexual Activity  . Alcohol use: Yes    Comment: Occ.   . Drug use: No  . Sexual  activity: Not on file  Lifestyle  . Physical activity    Days per week: Not on file    Minutes per session: Not on file  . Stress: Not on file  Relationships  . Social Herbalist on phone: Not on file    Gets together: Not on file    Attends religious service: Not on file    Active member of club or organization: Not on file    Attends meetings of clubs or organizations: Not on file    Relationship status: Not on file  Other Topics Concern  . Not on file  Social History Narrative   Lives in [place], [place], has family and friends in [place].    Family History  Problem Relation Age of Onset  . Diabetes Father   . Stomach cancer Neg Hx   . Colon cancer Neg Hx   . Rectal cancer Neg Hx   . Esophageal cancer Neg Hx   . Pancreatic cancer Neg Hx     Review of Systems  Constitutional: Negative for chills and fever.  Respiratory: Positive for shortness of breath and wheezing (a little on occasion - at night when he lays on back). Negative for cough.   Cardiovascular: Positive for chest pain. Negative for palpitations and leg swelling.  Neurological: Negative for light-headedness and headaches.  Psychiatric/Behavioral: Positive for sleep disturbance.       Objective:   Vitals:   05/13/19 1320  BP: 138/60  Pulse: (!) 57  Resp: 18  Temp: 98.1 F (36.7 C)  SpO2: 96%   BP Readings from Last 3 Encounters:  05/13/19 138/60  05/13/19 (!) 156/76  02/13/19 110/74   Wt Readings from Last 3 Encounters:  05/13/19 255 lb 12.8 oz (116 kg)  05/13/19 257 lb (116.6 kg)  02/28/19 238 lb (108 kg)  Body mass index is 34.69 kg/m.   Physical Exam    Constitutional: Appears well-developed and well-nourished. No distress.  HENT:  Head: Normocephalic and atraumatic.  Neck: Neck supple. No tracheal deviation present. No thyromegaly present.  No cervical lymphadenopathy Cardiovascular: Normal rate, regular rhythm and normal heart sounds.  No murmur heard. No carotid bruit .   Trace bilateral ankle edema Pulmonary/Chest: Effort normal and breath sounds normal. No respiratory distress. No has no wheezes. No rales.  Skin: Skin is warm and dry. Not diaphoretic.  Psychiatric: Normal mood and affect. Behavior is normal.    Diabetic Foot Exam - Simple   Simple Foot Form Diabetic Foot exam was performed with the following findings: Yes 05/13/2019  1:51 PM  Visual Inspection No deformities, no ulcerations, no other skin breakdown bilaterally: Yes Sensation Testing Intact to touch and monofilament testing bilaterally: Yes Pulse Check Posterior Tibialis and Dorsalis pulse intact bilaterally: Yes Comments      Assessment & Plan:    See Problem List for Assessment and Plan of chronic medical problems.

## 2019-05-11 NOTE — Patient Instructions (Addendum)
  Tests ordered today. Your results will be released to Maish Vaya (or called to you) after review.  If any changes need to be made, you will be notified at that same time.  Flu immunization administered today.    Medications reviewed and updated.  Changes include :   Take your insulin twice a day 90 units in am and 30 units in evening     Please followup in 6 months

## 2019-05-13 ENCOUNTER — Encounter: Payer: Self-pay | Admitting: Internal Medicine

## 2019-05-13 ENCOUNTER — Other Ambulatory Visit (INDEPENDENT_AMBULATORY_CARE_PROVIDER_SITE_OTHER): Payer: Medicare Other

## 2019-05-13 ENCOUNTER — Ambulatory Visit (INDEPENDENT_AMBULATORY_CARE_PROVIDER_SITE_OTHER): Payer: Medicare Other | Admitting: Cardiology

## 2019-05-13 ENCOUNTER — Encounter: Payer: Self-pay | Admitting: Cardiology

## 2019-05-13 ENCOUNTER — Other Ambulatory Visit: Payer: Self-pay

## 2019-05-13 ENCOUNTER — Ambulatory Visit: Payer: Medicare Other | Admitting: Internal Medicine

## 2019-05-13 ENCOUNTER — Ambulatory Visit (INDEPENDENT_AMBULATORY_CARE_PROVIDER_SITE_OTHER): Payer: Medicare Other | Admitting: Internal Medicine

## 2019-05-13 VITALS — BP 138/60 | HR 57 | Temp 98.1°F | Resp 18 | Ht 72.0 in | Wt 255.8 lb

## 2019-05-13 VITALS — BP 156/76 | HR 60 | Temp 97.3°F | Ht 72.0 in | Wt 257.0 lb

## 2019-05-13 DIAGNOSIS — Z951 Presence of aortocoronary bypass graft: Secondary | ICD-10-CM

## 2019-05-13 DIAGNOSIS — E1122 Type 2 diabetes mellitus with diabetic chronic kidney disease: Secondary | ICD-10-CM

## 2019-05-13 DIAGNOSIS — I5042 Chronic combined systolic (congestive) and diastolic (congestive) heart failure: Secondary | ICD-10-CM

## 2019-05-13 DIAGNOSIS — I2510 Atherosclerotic heart disease of native coronary artery without angina pectoris: Secondary | ICD-10-CM | POA: Diagnosis not present

## 2019-05-13 DIAGNOSIS — N183 Chronic kidney disease, stage 3 unspecified: Secondary | ICD-10-CM

## 2019-05-13 DIAGNOSIS — Z23 Encounter for immunization: Secondary | ICD-10-CM

## 2019-05-13 DIAGNOSIS — I25119 Atherosclerotic heart disease of native coronary artery with unspecified angina pectoris: Secondary | ICD-10-CM

## 2019-05-13 DIAGNOSIS — E782 Mixed hyperlipidemia: Secondary | ICD-10-CM

## 2019-05-13 DIAGNOSIS — I10 Essential (primary) hypertension: Secondary | ICD-10-CM

## 2019-05-13 DIAGNOSIS — I255 Ischemic cardiomyopathy: Secondary | ICD-10-CM

## 2019-05-13 DIAGNOSIS — I2584 Coronary atherosclerosis due to calcified coronary lesion: Secondary | ICD-10-CM

## 2019-05-13 DIAGNOSIS — K219 Gastro-esophageal reflux disease without esophagitis: Secondary | ICD-10-CM | POA: Diagnosis not present

## 2019-05-13 DIAGNOSIS — Z794 Long term (current) use of insulin: Secondary | ICD-10-CM

## 2019-05-13 DIAGNOSIS — G479 Sleep disorder, unspecified: Secondary | ICD-10-CM

## 2019-05-13 LAB — COMPREHENSIVE METABOLIC PANEL
ALT: 19 U/L (ref 0–53)
AST: 13 U/L (ref 0–37)
Albumin: 4.2 g/dL (ref 3.5–5.2)
Alkaline Phosphatase: 83 U/L (ref 39–117)
BUN: 42 mg/dL — ABNORMAL HIGH (ref 6–23)
CO2: 27 mEq/L (ref 19–32)
Calcium: 9.7 mg/dL (ref 8.4–10.5)
Chloride: 102 mEq/L (ref 96–112)
Creatinine, Ser: 1.76 mg/dL — ABNORMAL HIGH (ref 0.40–1.50)
GFR: 38.6 mL/min — ABNORMAL LOW (ref >60.00)
Glucose, Bld: 182 mg/dL — ABNORMAL HIGH (ref 70–99)
Potassium: 4.9 mEq/L (ref 3.5–5.1)
Sodium: 138 mEq/L (ref 135–145)
Total Bilirubin: 1.5 mg/dL — ABNORMAL HIGH (ref 0.2–1.2)
Total Protein: 7.6 g/dL (ref 6.0–8.3)

## 2019-05-13 LAB — CBC WITH DIFFERENTIAL/PLATELET
Basophils Absolute: 0.1 10*3/uL (ref 0.0–0.1)
Basophils Relative: 0.7 % (ref 0.0–3.0)
Eosinophils Absolute: 0.2 10*3/uL (ref 0.0–0.7)
Eosinophils Relative: 2 % (ref 0.0–5.0)
HCT: 37.8 % — ABNORMAL LOW (ref 39.0–52.0)
Hemoglobin: 12.9 g/dL — ABNORMAL LOW (ref 13.0–17.0)
Lymphocytes Relative: 18.7 % (ref 12.0–46.0)
Lymphs Abs: 1.5 10*3/uL (ref 0.7–4.0)
MCHC: 34.2 g/dL (ref 30.0–36.0)
MCV: 87.5 fl (ref 78.0–100.0)
Monocytes Absolute: 0.8 10*3/uL (ref 0.1–1.0)
Monocytes Relative: 9.5 % (ref 3.0–12.0)
Neutro Abs: 5.7 10*3/uL (ref 1.4–7.7)
Neutrophils Relative %: 69.1 % (ref 43.0–77.0)
Platelets: 178 10*3/uL (ref 150.0–400.0)
RBC: 4.31 Mil/uL (ref 4.22–5.81)
RDW: 14.9 % (ref 11.5–15.5)
WBC: 8.2 10*3/uL (ref 4.0–10.5)

## 2019-05-13 LAB — LIPID PANEL
Cholesterol: 198 mg/dL (ref 0–200)
HDL: 43.9 mg/dL (ref >39.00)
LDL Cholesterol: 120 mg/dL — ABNORMAL HIGH (ref 0–99)
NonHDL: 153.66
Total CHOL/HDL Ratio: 5
Triglycerides: 169 mg/dL — ABNORMAL HIGH (ref 0.0–149.0)
VLDL: 33.8 mg/dL (ref 0.0–40.0)

## 2019-05-13 LAB — HEMOGLOBIN A1C: Hgb A1c MFr Bld: 8.2 % — ABNORMAL HIGH (ref 4.6–6.5)

## 2019-05-13 MED ORDER — NITROGLYCERIN 0.4 MG SL SUBL: SUBLINGUAL_TABLET | SUBLINGUAL | 3 refills | Status: DC

## 2019-05-13 MED ORDER — HUMULIN 70/30 (70-30) 100 UNIT/ML ~~LOC~~ SUSP: SUBCUTANEOUS | 3 refills | Status: DC

## 2019-05-13 MED ORDER — CLOPIDOGREL BISULFATE 75 MG PO TABS: 75.0000 mg | ORAL_TABLET | Freq: Every day | ORAL | 3 refills | Status: DC

## 2019-05-13 MED ORDER — AMLODIPINE BESYLATE 5 MG PO TABS: 5.0000 mg | ORAL_TABLET | Freq: Every day | ORAL | 3 refills | Status: DC

## 2019-05-13 MED ORDER — ISOSORBIDE MONONITRATE ER 120 MG PO TB24: 120.0000 mg | ORAL_TABLET | Freq: Every day | ORAL | 3 refills | Status: DC

## 2019-05-13 MED ORDER — RANOLAZINE ER 500 MG PO TB12: 500.0000 mg | ORAL_TABLET | Freq: Two times a day (BID) | ORAL | 3 refills | Status: DC

## 2019-05-13 NOTE — Progress Notes (Signed)
Cardiology Office Note:    Date:  05/13/2019   ID:  ZALMAN HULL, DOB [birth year], MRN [id number]  PCP:  Binnie Rail, MD  Cardiologist:  Buford Dresser, MD PhD  Referring MD: Binnie Rail, MD   CC: follow up   History of Present Illness:    DEONTREY MASSI is a 68 y.o. male with a hx of CAD s/p prior CABG, recent NSTEMI, ischemic cardiomyopathy with EF 40-45% who is seen for heart failure post hospitalization follow up. He was discharged from the hospital on 01/30/19. Medication reconciliation completed at this visit as well.  Cardiac history: Hospitalized 10/2018 for NSTEMI and heart failure symptoms. Severe native CAD, no intervenable targets on cath. Recommended for medication management. Prior history notable for NSTEMI in 2006 that lead to CABG, with LIMA-LAD, L Radial-OM-D1, SVG-PDA. Noted to have ischemic cardiomyopathy with EF 40-45%. He was readmitted and discharged with heart failure after running out of furosemide for two weeks.    Today: Feels like he is still taking a lot of SL NG. 25 pills last him about 2 weeks max. Taking imdur 120 mg daily. Before nitro feels like he can't catch his breath. Very limiting with climbing stairs, etc. Feels like he has indigestion at nighttime, always takes a nitro at night because it helps him sleep. Chest hurts when he lays on his side but feels fine when lying on his back. Has been taking protonix recently, wasn't taking for a while. Noticed a slight improvement in his symptoms on this.   Has been taking furosemide 3-4 times/week. Takes potassium when he takes the lasix. Takes the lasix when he notices fluid building up on his legs. Reviewed that renal function was abnormal at PCP visit 02/11/19. Seeing Dr. Quay Burow this afternoon, thinks he will have bloodwork done.   BP elevated today, his BP cuff at home is broken. Not sure what it has been running at home.   Brilinta and entresto are very expensive for him, as he is in the donut  hole. Discussed changing to clopidogrel instead of ticagrelor, but there is not a true alternative to entresto.  Denies chest pain specifically (though his SOB is sometimes his anginal equivalent), shortness of breath at rest. Does have SOB with minimal exertion. No PND, orthopnea, or unexpected weight gain. No syncope or palpitations. Does have occasional mild LE edema, which improves with furosemide.  Past Medical History:  Diagnosis Date  . BPH (benign prostatic hypertrophy)   . Cardiomyopathy, ischemic   . Coronary artery disease    at [facility], [place] 2009 (GRAFTS PATENT), cath 05/2018 w/ SVG-PDA 100%>>med rx  . Diabetes mellitus, type 2 ([place])   . Frequency of urination   . GERD (gastroesophageal reflux disease)   . Gross hematuria   . History of CHF (congestive heart failure) 2007  . History of kidney stones   . History of myocardial infarction 2006, 2019   2006>>CABG, 2019 cath w/ med rx  . History of urinary retention   . Hyperlipidemia   . Nocturia   . Renal calculi    BILATERAL PER CT SCAN--  NON-OBSTRUCTIVE  . S/P CABG x 4 2006    Past Surgical History:  Procedure Laterality Date  . CARDIAC CATHETERIZATION  01/17/2008   GRAFTS PATENT / EF 40%,  DR SANTOS ([facility])  . CORONARY ARTERY BYPASS GRAFT  04/2005   LIMA-LAD, L radial-OM-D1 and SVG-PDA  . CYSTO / RIGHT URETERAL STENT PLACEMENT  10-08-1999  . CYSTO/  REMOVAL  BLADDER STONES  01-14-2010  . CYSTOSCOPY  10/16/2012   Procedure: CYSTOSCOPY;  Surgeon: Malka So, MD;  Location: [facility];  Service: Urology;  Laterality: N/A;  Cystoscopy with Clot Evacuation and fulguration bladder neck.  Marland Kitchen EXTRACORPOREAL SHOCK WAVE LITHOTRIPSY  10-15-2009   RIGHT  . KNEE ARTHROSCOPY  1978   RIGHT  . LEFT SHOULDER SURGERY   2002  . LUMBAR FUSION  09-10-2008   L4 - L5  . PERCUTANEOUS NEPHROSTOLITHOTOMY  1979  . PROSTATE BIOPSY  10/16/2012   Procedure: PROSTATE BIOPSY;  Surgeon: Malka So, MD;  Location: [facility];  Service: Urology;  Laterality: N/A;  Through ultrasound.  Marland Kitchen RIGHT/LEFT HEART CATH AND CORONARY/GRAFT ANGIOGRAPHY N/A 10/26/2018   Procedure: RIGHT/LEFT HEART CATH AND CORONARY/GRAFT ANGIOGRAPHY;  Surgeon: Martinique, Peter M, MD;  Location: [facility];  Service: Cardiovascular;  Laterality: N/A;  . SHOULDER ARTHROSCOPY W/ SUBACROMIAL DECOMPRESSION AND DISTAL CLAVICLE EXCISION  10-11-2004   AND PARTIAL ROTATOR CUFF REPAIR  . TRANSTHORACIC ECHOCARDIOGRAM  69/62/9528   LV SYSTOLIC FUNCTION MILDLY REDUCED/ EF 48%/ LV FILLING PATTERN  IS IMPAIRED/ HYPOKINESIS IN THE MID AND BASALAR SEPTUM & ANTEROSEPTUM  . TURP VAPORIZATION  01-19-2010   BPH W/ BOO  . URETEROLITHOTOMY  1976    Current Medications: Current Outpatient Medications on File Prior to Visit  Medication Sig  . aspirin 81 MG tablet Take 81 mg by mouth daily at 12 noon.   Marland Kitchen atorvastatin (LIPITOR) 80 MG tablet Take 1 tablet (80 mg total) by mouth at bedtime.  . BD VEO INSULIN SYR ULTRAFINE 31G X 15/64" 1 ML MISC use as directed TO INJECT INSULIN  twice a day  . BRILINTA 90 MG TABS tablet Take 1 tablet (90 mg total) by mouth 2 (two) times daily.  . eszopiclone (LUNESTA) 2 MG TABS tablet Take 1 tablet (2 mg total) by mouth at bedtime as needed for sleep. Take immediately before bedtime  . finasteride (PROSCAR) 5 MG tablet Take 1 tablet (5 mg total) by mouth daily. (Patient taking differently: Take 5 mg by mouth daily at 12 noon. 12 noon)  . furosemide (LASIX) 80 MG tablet Take 1 tablet (80 mg total) by mouth daily as needed (If weight increases 3 lbs in one day or 5 lbs in one week). TAKE ON EMPTY STOMACH  . insulin NPH-regular Human (HUMULIN 70/30) (70-30) 100 UNIT/ML injection 90 units with breakfast, and 55 units with supper (Patient taking differently: Inject 40-70 Units into the skin See admin instructions. 80 mg in the morning and 40 mg in the evening)  . isosorbide mononitrate (IMDUR) 120 MG 24 hr tablet Take 1 tablet  (120 mg total) by mouth daily.  . metFORMIN (GLUCOPHAGE) 1000 MG tablet Take 1 tablet (1,000 mg total) by mouth daily with breakfast. Follow=up appt is due must see provider for 90 day suppy  . metoprolol succinate (TOPROL-XL) 50 MG 24 hr tablet Take 1 tablet (50 mg total) by mouth daily at 12 noon. Take with or immediately following a meal.  . nitroGLYCERIN (NITROSTAT) 0.4 MG SL tablet DISSOLVE 1 TABLET UNDER THE TONGUE EVERY 5 MINUTES AS NEEDED FOR CHEST PAIN. CALL MD/911 IF TAKE 2 DOSES. MAX 3 DOSES/DAY  . pantoprazole (PROTONIX) 40 MG tablet Take 1 tablet (40 mg total) by mouth 2 (two) times a day.  . potassium chloride SA (K-DUR) 20 MEQ tablet Take 1 tablet (20 mEq total) by mouth daily as needed (when you take lasix).  Marland Kitchen  sacubitril-valsartan (ENTRESTO) 49-51 MG Take 1 tablet by mouth 2 (two) times daily.   No current facility-administered medications on file prior to visit.      Allergies:   Patient has no known allergies.   Social History   Socioeconomic History  . Marital status: Married    Spouse name: Not on file  . Number of children: 4  . Years of education: Not on file  . Highest education level: Not on file  Occupational History  . Not on file  Social Needs  . Financial resource strain: Not on file  . Food insecurity    Worry: Not on file    Inability: Not on file  . Transportation needs    Medical: Not on file    Non-medical: Not on file  Tobacco Use  . Smoking status: Never Smoker  . Smokeless tobacco: Never Used  Substance and Sexual Activity  . Alcohol use: Yes    Comment: Occ.   . Drug use: No  . Sexual activity: Not on file  Lifestyle  . Physical activity    Days per week: Not on file    Minutes per session: Not on file  . Stress: Not on file  Relationships  . Social Herbalist on phone: Not on file    Gets together: Not on file    Attends religious service: Not on file    Active member of club or organization: Not on file    Attends  meetings of clubs or organizations: Not on file    Relationship status: Not on file  Other Topics Concern  . Not on file  Social History Narrative   Lives in [place], [place], has family and friends in [place].     Family History: The patient's family history includes Diabetes in his father. There is no history of Stomach cancer, Colon cancer, Rectal cancer, Esophageal cancer, or Pancreatic cancer.  ROS:   Please see the history of present illness.  Additional pertinent ROS: Constitutional: Negative for chills, fever, night sweats, unintentional weight loss  HENT: Negative for ear pain and hearing loss.   Eyes: Negative for loss of vision and eye pain.  Respiratory: Negative for cough, sputum, wheezing.   Cardiovascular: See HPI. Gastrointestinal: Negative for abdominal pain, melena, and hematochezia.  Genitourinary: Negative for dysuria and hematuria.  Musculoskeletal: Negative for falls and myalgias.  Skin: Negative for itching and rash.  Neurological: Negative for focal weakness, focal sensory changes and loss of consciousness.  Endo/Heme/Allergies: Does not bruise/bleed easily.    EKGs/Labs/Other Studies Reviewed:    The following studies were reviewed today: Echo 10/26/18 1. The left ventricle has mild-moderately reduced systolic function, with an ejection fraction of 40-45%. The cavity size was severely dilated. Left ventricular diastolic Doppler parameters are consistent with pseudonormalization Elevated left ventricular end-diastolic pressure.  2. Poor images preclude accurate assessment of wall motion even with definity.  3. The right ventricle has normal systolic function. The cavity was normal. There is no increase in right ventricular wall thickness.  4. Left atrial size was moderately dilated.  5. The mitral valve is normal in structure. Mitral valve regurgitation is moderate by color flow Doppler.  6. The tricuspid valve is normal in structure.  7. The aortic valve is  tricuspid.  8. The pulmonic valve was normal in structure.  9. There is mild dilatation of the ascending aorta measuring 38 mm. 10. Right atrial pressure is estimated at 3 mmHg.  [facility] 10/26/18  Mid LM to Dist LM lesion is 95% stenosed.  Prox LAD lesion is 100% stenosed.  Prox Cx lesion is 100% stenosed.  Prox RCA to Dist RCA lesion is 100% stenosed.  LV end diastolic pressure is normal.  Hemodynamic findings consistent with mild pulmonary hypertension.   1. Occlusive native vessel CAD.     -95% distal left main    - 100% proximal LAD after the first septal perforator    - 100% LCx after a very small OM1.    - 100% proximal RCA 2. Patent LIMA to the LAD 3. Patent free radial graft arising from the mid LIMA graft and supplying the first diagonal and OM 4. SVG to RCA is known to be occluded. The distal RCA is supplied by collaterals.  5. Normal LV filling pressures 6. Mild pulmonary HTN. 7. Preserved cardiac output.  Plan: continue medical therapy for CHF. Will switch IV lasix to po. Stop IV Ntg and heparin.   EKG:  EKG is personally reviewed.  The ekg ordered today demonstrates NSR, iLBBB. Inferior/lateral ST changes unchanged from prior.  Recent Labs: 01/28/2019: Magnesium 1.7 01/30/2019: B Natriuretic Peptide 686.0 02/11/2019: ALT 16; BUN 52; Creatinine, Ser 2.29; Hemoglobin 12.6; Platelets 273.0; Potassium 4.8; Pro B Natriuretic peptide (BNP) 567.0; Sodium 137; TSH 1.74  Recent Lipid Panel    Component Value Date/Time   CHOL 177 01/29/2019 0541   TRIG 107 01/29/2019 0541   HDL 45 01/29/2019 0541   CHOLHDL 3.9 01/29/2019 0541   VLDL 21 01/29/2019 0541   LDLCALC 111 (H) 01/29/2019 0541   LDLDIRECT 155.0 07/13/2017 1033    Physical Exam:    VS:  BP (!) 156/76   Pulse 60   Temp (!) 97.3 F (36.3 C)   Ht 6' (1.829 m)   Wt 257 lb (116.6 kg)   SpO2 98%   BMI 34.86 kg/m     Wt Readings from Last 3 Encounters:  05/13/19 257 lb (116.6 kg)  02/28/19 238 lb (108 kg)   02/13/19 239 lb 6.4 oz (108.6 kg)   GEN: Well nourished, well developed in no acute distress HEENT: Normal, moist mucous membranes NECK: No JVD CARDIAC: regular rhythm, normal S1 and S2, no murmurs, rubs, gallops.  VASCULAR: Radial and DP pulses 2+ bilaterally. No carotid bruits RESPIRATORY:  Clear to auscultation without rales, wheezing or rhonchi  ABDOMEN: Soft, non-tender, non-distended MUSCULOSKELETAL:  Ambulates independently SKIN: Warm and dry, trivial bilateral LE edema NEUROLOGIC:  Alert and oriented x 3. No focal neuro deficits noted. PSYCHIATRIC:  Normal affect   ASSESSMENT:    1. Coronary artery disease involving native coronary artery of native heart with angina pectoris ([place])   2. Chronic combined systolic and diastolic heart failure ([place])   3. Ischemic cardiomyopathy   4. Essential hypertension, benign   5. Mixed hyperlipidemia   6. Type 2 diabetes mellitus with stage 3 chronic kidney disease, with long-term current use of insulin ([place])   7. Hx of CABG    PLAN:    CAD s/p CABG 2006: known SVG-PDA is occluded, other grafts are patent.  -is taking large amounts of SL NG. Most of this is for shortness of breath, not chest pain per se. Sometimes takes empirically to avoid shortness of breath -on aspirin 81 mg -on ticagrelor 90 mg BID. Given his main symptoms of shortness of breath, as well as cost, will transition him to clopidogrel to see if his breathing improves. Needs DAPT until at least 10/2019 given NSTEMI -on  metoprolol succinate 50 mg daily -increased imdur at last visit, still having symptoms -will trial ranexa to see if this improves his symptoms -has sublingual nitroglycerin, reordered today -counseled on life threatening hypotension with isosorbide, nitroglycerin and sildenafil. Would not use sildenafil given this. -given high blood pressure, will also start amlodipine 5 mg for both BP and antianginal effects  Chronic systolic and diastolic heart failure:   -NYHA class II symptoms today. He had significant angina with HF exacerbation, likely due to elevated LVEDP.  -only trivial edema today, still with DOE -difficult echo windows, but known to be 40-45% with grade 2 diastolic dysfunction; due to ischemic cardiomyopathy -continue metoprolol succinate 50 mg daily -continue entresto 49-51 mg BID. He does have trouble affording medications when he is in the donut hole. With change from brilinta to plavix today, hopefully this will make entresto more affordable -continue lasix 80 mg as needed and potassium when he takes lasix. His recent kidney functioned worsened (peak Cr 2.29), today down to 1.76 at PCP office, similar to recent baseline -educated on heart failure, including daily weights, sodium and fluid guidelines, signs/symptoms to watch for, diet and activity recommendations  Hyperlipidemia: -on atorvastatin 80 mg, with LDL goal <70.  -lipids 01/29/19 with LDL 111. Recheck today at PCP with LDL 120. As he needs almost a 50% reduction in LDL, unlikely to achieve this with either change to rosuvastatin or adding ezetimibe, but not at goal. -have discussed PCSK9 inhibitors in the past. These would be ideal treatment option. Cost is a concern, and previously we balanced starting entresto vs. PCSK9i. Continue to address. Similar discussion re: vascepa for TG.  Type II diabetes, on insulin and metformin, with chronic kidney disease stage 3. Most recent X3G 8.3 -trulicity was too expensive for him -would like to use jardiance given CKD, obesity, and CAD, though concerned about cost. Will need to continue to prioritize medications so that they are affordable and allow adherence.  Plan for follow up: 6 weeks. Discuss PharmD referral again to discuss medication options/cost.  Medication Adjustments/Labs and Tests Ordered: Current medicines are reviewed at length with the patient today.  Concerns regarding medicines are outlined above.  Orders Placed This  Encounter  Procedures  . EKG 12-Lead   Meds ordered this encounter  Medications  . isosorbide mononitrate (IMDUR) 120 MG 24 hr tablet    Sig: Take 1 tablet (120 mg total) by mouth daily.    Dispense:  90 tablet    Refill:  3    Please wait to fill until patient calls  . amLODipine (NORVASC) 5 MG tablet    Sig: Take 1 tablet (5 mg total) by mouth daily.    Dispense:  90 tablet    Refill:  3  . ranolazine (RANEXA) 500 MG 12 hr tablet    Sig: Take 1 tablet (500 mg total) by mouth 2 (two) times daily.    Dispense:  180 tablet    Refill:  3  . nitroGLYCERIN (NITROSTAT) 0.4 MG SL tablet    Sig: DISSOLVE 1 TABLET UNDER THE TONGUE EVERY 5 MINUTES AS NEEDED FOR CHEST PAIN. CALL MD/911 IF TAKE 2 DOSES. MAX 3 DOSES/DAY    Dispense:  100 tablet    Refill:  3  . clopidogrel (PLAVIX) 75 MG tablet    Sig: Take 1 tablet (75 mg total) by mouth daily.    Dispense:  90 tablet    Refill:  3    Patient Instructions  Medication Instructions:  Stop:  Brilinta 90 mg   Start: Plavix 75 mg daily           Ranexa 500 mg two times a day           Amlodipine 5 mg daily  If you need a refill on your cardiac medications before your next appointment, please call your pharmacy.   Lab work: None  Testing/Procedures: None  Follow-Up: At [facility], you and your health needs are our priority.  As part of our continuing mission to provide you with exceptional heart care, we have created designated Provider Care Teams.  These Care Teams include your primary Cardiologist (physician) and Advanced Practice Providers (APPs -  Physician Assistants and Nurse Practitioners) who all work together to provide you with the care you need, when you need it. You will need a follow up appointment in 6 weeks.  Please call our office 2 months in advance to schedule this appointment.  You may see Buford Dresser, MD or one of the following Advanced Practice Providers on your designated Care Team:   Rosaria Ferries,  PA-C . Jory Sims, DNP, ANP        Signed, Buford Dresser, MD PhD 05/13/2019   [facility]

## 2019-05-13 NOTE — Assessment & Plan Note (Signed)
Saw cardiology earlier today Appears euvolemic Medications per cardiology CMP

## 2019-05-13 NOTE — Assessment & Plan Note (Signed)
CMP today Continue increase fluids Avoid NSAIDs

## 2019-05-13 NOTE — Assessment & Plan Note (Signed)
Cherly Hensen a couple of months ago and it was not effective Still having some sleep difficulty, some of which is related to the chest pain that he has that wakes him up Cardiology started him on Ranexa, which will hopefully help If he is still not able to sleep will consider different sleep medication, but explained to him the potential side effects and risks of sleep medication-he will let me know if he still not sleeping

## 2019-05-13 NOTE — Assessment & Plan Note (Signed)
Sugars not ideally controlled Explained to him that his insulin is both short acting and long-acting with each injection that he needs to take it twice a day-decrease evening dose Humulin 70/30  - 90 units with breakfast and 30 units with supper Discussed that we can adjust his insulin if needed, especially if his sugars go too low A1c today

## 2019-05-13 NOTE — Assessment & Plan Note (Signed)
GERD controlled Continue daily medication  

## 2019-05-13 NOTE — Assessment & Plan Note (Signed)
Blood pressure is slightly elevated at cardiology office and borderline high here Started on amlodipine 5 mg daily by cardiology CMP

## 2019-05-13 NOTE — Assessment & Plan Note (Signed)
Saw cardiology earlier today-medications were adjusted He is experiencing chest pain-started on Ranexa Also started on amlodipine CMP, CBC, lipid panel

## 2019-05-13 NOTE — Assessment & Plan Note (Signed)
Continue statin Check lipid panel, CMP 

## 2019-05-13 NOTE — Patient Instructions (Signed)
Medication Instructions:  Stop: Brilinta 90 mg   Start: Plavix 75 mg daily           Ranexa 500 mg two times a day           Amlodipine 5 mg daily  If you need a refill on your cardiac medications before your next appointment, please call your pharmacy.   Lab work: None  Testing/Procedures: None  Follow-Up: At [facility], you and your health needs are our priority.  As part of our continuing mission to provide you with exceptional heart care, we have created designated Provider Care Teams.  These Care Teams include your primary Cardiologist (physician) and Advanced Practice Providers (APPs -  Physician Assistants and Nurse Practitioners) who all work together to provide you with the care you need, when you need it. You will need a follow up appointment in 6 weeks.  Please call our office 2 months in advance to schedule this appointment.  You may see Buford Dresser, MD or one of the following Advanced Practice Providers on your designated Care Team:   Rosaria Ferries, PA-C . Jory Sims, DNP, ANP

## 2019-05-15 ENCOUNTER — Other Ambulatory Visit: Payer: Self-pay | Admitting: Internal Medicine

## 2019-05-15 MED ORDER — ROSUVASTATIN CALCIUM 40 MG PO TABS: 40.0000 mg | ORAL_TABLET | Freq: Every day | ORAL | 3 refills | Status: DC

## 2019-05-20 ENCOUNTER — Encounter: Payer: Self-pay | Admitting: Cardiology

## 2019-05-21 ENCOUNTER — Telehealth: Payer: Self-pay | Admitting: Cardiology

## 2019-05-21 NOTE — Telephone Encounter (Signed)
° °  Patient calling the office for samples of medication:   1.  What medication and dosage are you requesting samples for? Entresto  2.  Are you currently out of this medication? Yes

## 2019-05-21 NOTE — Telephone Encounter (Signed)
SPOKE TO PATIENT , patient states it cost over $200 for  30 day supply.   Gave patient [employer]  [phone]- 277 [phone] patient assistance  Line  aware will send message to Dr Harrell Gave

## 2019-05-23 NOTE — Telephone Encounter (Signed)
Patient assistance will help for 1 year and we can try renewal every year. If additional financial help is needed we can forward the message to Gerad Cornelio Sarna (Nurse, learning disability) for assistance with medicaid or any other agency.

## 2019-05-23 NOTE — Telephone Encounter (Signed)
Hi Raquel--do we have any additional help for Entresto beyond patient assistance? This will be a long term med for him, so I'm not sure if there are other routes we can try. Thanks!

## 2019-05-30 ENCOUNTER — Ambulatory Visit: Payer: Medicare Other | Admitting: Cardiology

## 2019-06-04 ENCOUNTER — Ambulatory Visit (INDEPENDENT_AMBULATORY_CARE_PROVIDER_SITE_OTHER): Payer: Medicare Other | Admitting: Cardiology

## 2019-06-04 ENCOUNTER — Other Ambulatory Visit: Payer: Self-pay

## 2019-06-04 ENCOUNTER — Encounter: Payer: Self-pay | Admitting: Cardiology

## 2019-06-04 VITALS — BP 143/65 | HR 58 | Temp 97.0°F | Ht 72.0 in | Wt 252.8 lb

## 2019-06-04 DIAGNOSIS — Z794 Long term (current) use of insulin: Secondary | ICD-10-CM

## 2019-06-04 DIAGNOSIS — I5042 Chronic combined systolic (congestive) and diastolic (congestive) heart failure: Secondary | ICD-10-CM | POA: Diagnosis not present

## 2019-06-04 DIAGNOSIS — E782 Mixed hyperlipidemia: Secondary | ICD-10-CM

## 2019-06-04 DIAGNOSIS — N183 Chronic kidney disease, stage 3 unspecified: Secondary | ICD-10-CM

## 2019-06-04 DIAGNOSIS — Z951 Presence of aortocoronary bypass graft: Secondary | ICD-10-CM

## 2019-06-04 DIAGNOSIS — I25118 Atherosclerotic heart disease of native coronary artery with other forms of angina pectoris: Secondary | ICD-10-CM | POA: Diagnosis not present

## 2019-06-04 DIAGNOSIS — I10 Essential (primary) hypertension: Secondary | ICD-10-CM | POA: Diagnosis not present

## 2019-06-04 DIAGNOSIS — E1122 Type 2 diabetes mellitus with diabetic chronic kidney disease: Secondary | ICD-10-CM

## 2019-06-04 DIAGNOSIS — I255 Ischemic cardiomyopathy: Secondary | ICD-10-CM

## 2019-06-04 MED ORDER — VALSARTAN 40 MG PO TABS: 40.0000 mg | ORAL_TABLET | Freq: Every day | ORAL | 11 refills | Status: DC

## 2019-06-04 NOTE — Progress Notes (Signed)
Cardiology Office Note:    Date:  06/04/2019   ID:  Russell Trevino, DOB [birth year], MRN [id number]  PCP:  Binnie Rail, MD  Cardiologist:  Buford Dresser, MD PhD  Referring MD: Binnie Rail, MD   CC: follow up   History of Present Illness:    Russell Trevino is a 68 y.o. male with a hx of CAD s/p prior CABG, recent NSTEMI, ischemic cardiomyopathy with EF 40-45% who is seen for heart failure post hospitalization follow up. He was discharged from the hospital on 01/30/19. Medication reconciliation completed at this visit as well.  Cardiac history: Hospitalized 10/2018 for NSTEMI and heart failure symptoms. Severe native CAD, no intervenable targets on cath. Recommended for medication management. Prior history notable for NSTEMI in 2006 that lead to CABG, with LIMA-LAD, L Radial-OM-D1, SVG-PDA. Noted to have ischemic cardiomyopathy with EF 40-45%. He was readmitted and discharged with heart failure after running out of furosemide for two weeks.    Today: Can't afford entresto. Called the company, awaiting forms. Hasn't had any entresto in about 4 days. Can afford other meds, but entresto was $260/mos. We have been limited with PCSK9i and SGLT2i as well due to cost. We discussed options for this. In the short term, we will change to an ARB to give him some GDMT. He is amenable to taking the medications but wonders if he might have other options. He can afford his other medications, but these high cost meds are probelmatic for him.  Breathing unchanged switching from brilinta to clopidogrel. Taking less SL NG, more like a few/day instead of many per day. Still taking NG for SOB, not chest pain.  Started on ranolazine, hard to tell if it is helping. Notices that his symptoms are the worst after he eats a big meal, so eating smaller meals.   Denies chest pain. No PND, orthopnea, LE edema or unexpected weight gain. No syncope or palpitations.  Past Medical History:  Diagnosis Date  .  BPH (benign prostatic hypertrophy)   . Cardiomyopathy, ischemic   . Coronary artery disease    at [facility], [place] 2009 (GRAFTS PATENT), cath 05/2018 w/ SVG-PDA 100%>>med rx  . Diabetes mellitus, type 2 ([place])   . Frequency of urination   . GERD (gastroesophageal reflux disease)   . Gross hematuria   . History of CHF (congestive heart failure) 2007  . History of kidney stones   . History of myocardial infarction 2006, 2019   2006>>CABG, 2019 cath w/ med rx  . History of urinary retention   . Hyperlipidemia   . Nocturia   . Renal calculi    BILATERAL PER CT SCAN--  NON-OBSTRUCTIVE  . S/P CABG x 4 2006    Past Surgical History:  Procedure Laterality Date  . CARDIAC CATHETERIZATION  01/17/2008   GRAFTS PATENT / EF 40%,  DR SANTOS ([facility])  . CORONARY ARTERY BYPASS GRAFT  04/2005   LIMA-LAD, L radial-OM-D1 and SVG-PDA  . CYSTO / RIGHT URETERAL STENT PLACEMENT  10-08-1999  . CYSTO/  REMOVAL BLADDER STONES  01-14-2010  . CYSTOSCOPY  10/16/2012   Procedure: CYSTOSCOPY;  Surgeon: Malka So, MD;  Location: [facility];  Service: Urology;  Laterality: N/A;  Cystoscopy with Clot Evacuation and fulguration bladder neck.  Marland Kitchen EXTRACORPOREAL SHOCK WAVE LITHOTRIPSY  10-15-2009   RIGHT  . KNEE ARTHROSCOPY  1978   RIGHT  . LEFT SHOULDER SURGERY   2002  . LUMBAR FUSION  09-10-2008   L4 -  L5  . PERCUTANEOUS NEPHROSTOLITHOTOMY  1979  . PROSTATE BIOPSY  10/16/2012   Procedure: PROSTATE BIOPSY;  Surgeon: Malka So, MD;  Location: [facility];  Service: Urology;  Laterality: N/A;  Through ultrasound.  Marland Kitchen RIGHT/LEFT HEART CATH AND CORONARY/GRAFT ANGIOGRAPHY N/A 10/26/2018   Procedure: RIGHT/LEFT HEART CATH AND CORONARY/GRAFT ANGIOGRAPHY;  Surgeon: Martinique, Peter M, MD;  Location: [place] [facility];  Service: Cardiovascular;  Laterality: N/A;  . SHOULDER ARTHROSCOPY W/ SUBACROMIAL DECOMPRESSION AND DISTAL CLAVICLE EXCISION  10-11-2004   AND PARTIAL ROTATOR CUFF REPAIR  .  TRANSTHORACIC ECHOCARDIOGRAM  70/26/3785   LV SYSTOLIC FUNCTION MILDLY REDUCED/ EF 48%/ LV FILLING PATTERN  IS IMPAIRED/ HYPOKINESIS IN THE MID AND BASALAR SEPTUM & ANTEROSEPTUM  . TURP VAPORIZATION  01-19-2010   BPH W/ BOO  . URETEROLITHOTOMY  1976    Current Medications: Current Outpatient Medications on File Prior to Visit  Medication Sig  . amLODipine (NORVASC) 5 MG tablet Take 1 tablet (5 mg total) by mouth daily.  Marland Kitchen aspirin 81 MG tablet Take 81 mg by mouth daily at 12 noon.   . BD VEO INSULIN SYR ULTRAFINE 31G X 15/64" 1 ML MISC use as directed TO INJECT INSULIN  twice a day  . clopidogrel (PLAVIX) 75 MG tablet Take 1 tablet (75 mg total) by mouth daily.  . finasteride (PROSCAR) 5 MG tablet Take 1 tablet (5 mg total) by mouth daily. (Patient taking differently: Take 5 mg by mouth daily at 12 noon. 12 noon)  . furosemide (LASIX) 80 MG tablet Take 1 tablet (80 mg total) by mouth daily as needed (If weight increases 3 lbs in one day or 5 lbs in one week). TAKE ON EMPTY STOMACH  . insulin NPH-regular Human (HUMULIN 70/30) (70-30) 100 UNIT/ML injection 90 units with breakfast, and 30 units with supper  . isosorbide mononitrate (IMDUR) 120 MG 24 hr tablet Take 1 tablet (120 mg total) by mouth daily.  . metFORMIN (GLUCOPHAGE) 1000 MG tablet Take 1 tablet (1,000 mg total) by mouth daily with breakfast. Follow=up appt is due must see provider for 90 day suppy  . metoprolol succinate (TOPROL-XL) 50 MG 24 hr tablet Take 1 tablet (50 mg total) by mouth daily at 12 noon. Take with or immediately following a meal.  . nitroGLYCERIN (NITROSTAT) 0.4 MG SL tablet DISSOLVE 1 TABLET UNDER THE TONGUE EVERY 5 MINUTES AS NEEDED FOR CHEST PAIN. CALL MD/911 IF TAKE 2 DOSES. MAX 3 DOSES/DAY  . pantoprazole (PROTONIX) 40 MG tablet Take 1 tablet (40 mg total) by mouth 2 (two) times a day.  . potassium chloride SA (K-DUR) 20 MEQ tablet Take 1 tablet (20 mEq total) by mouth daily as needed (when you take lasix).  .  ranolazine (RANEXA) 500 MG 12 hr tablet Take 1 tablet (500 mg total) by mouth 2 (two) times daily.  . rosuvastatin (CRESTOR) 40 MG tablet Take 1 tablet (40 mg total) by mouth daily.   No current facility-administered medications on file prior to visit.      Allergies:   Patient has no known allergies.   Social History   Socioeconomic History  . Marital status: Married    Spouse name: Not on file  . Number of children: 4  . Years of education: Not on file  . Highest education level: Not on file  Occupational History  . Not on file  Social Needs  . Financial resource strain: Not on file  . Food insecurity    Worry:  Not on file    Inability: Not on file  . Transportation needs    Medical: Not on file    Non-medical: Not on file  Tobacco Use  . Smoking status: Never Smoker  . Smokeless tobacco: Never Used  Substance and Sexual Activity  . Alcohol use: Yes    Comment: Occ.   . Drug use: No  . Sexual activity: Not on file  Lifestyle  . Physical activity    Days per week: Not on file    Minutes per session: Not on file  . Stress: Not on file  Relationships  . Social Herbalist on phone: Not on file    Gets together: Not on file    Attends religious service: Not on file    Active member of club or organization: Not on file    Attends meetings of clubs or organizations: Not on file    Relationship status: Not on file  Other Topics Concern  . Not on file  Social History Narrative   Lives in [place], [place], has family and friends in [place].     Family History: The patient's family history includes Diabetes in his father. There is no history of Stomach cancer, Colon cancer, Rectal cancer, Esophageal cancer, or Pancreatic cancer.  ROS:   Please see the history of present illness.  Additional pertinent ROS: Constitutional: Negative for chills, fever, night sweats, unintentional weight loss  HENT: Negative for ear pain and hearing loss.   Eyes: Negative for loss of  vision and eye pain.  Respiratory: Negative for cough, sputum, wheezing.   Cardiovascular: See HPI. Gastrointestinal: Negative for abdominal pain, melena, and hematochezia.  Genitourinary: Negative for dysuria and hematuria.  Musculoskeletal: Negative for falls and myalgias.  Skin: Negative for itching and rash.  Neurological: Negative for focal weakness, focal sensory changes and loss of consciousness.  Endo/Heme/Allergies: Does not bruise/bleed easily.    EKGs/Labs/Other Studies Reviewed:    The following studies were reviewed today: Echo 10/26/18 1. The left ventricle has mild-moderately reduced systolic function, with an ejection fraction of 40-45%. The cavity size was severely dilated. Left ventricular diastolic Doppler parameters are consistent with pseudonormalization Elevated left ventricular end-diastolic pressure.  2. Poor images preclude accurate assessment of wall motion even with definity.  3. The right ventricle has normal systolic function. The cavity was normal. There is no increase in right ventricular wall thickness.  4. Left atrial size was moderately dilated.  5. The mitral valve is normal in structure. Mitral valve regurgitation is moderate by color flow Doppler.  6. The tricuspid valve is normal in structure.  7. The aortic valve is tricuspid.  8. The pulmonic valve was normal in structure.  9. There is mild dilatation of the ascending aorta measuring 38 mm. 10. Right atrial pressure is estimated at 3 mmHg.  R/LHC 10/26/18  Mid LM to Dist LM lesion is 95% stenosed.  Prox LAD lesion is 100% stenosed.  Prox Cx lesion is 100% stenosed.  Prox RCA to Dist RCA lesion is 100% stenosed.  LV end diastolic pressure is normal.  Hemodynamic findings consistent with mild pulmonary hypertension.   1. Occlusive native vessel CAD.     -95% distal left main    - 100% proximal LAD after the first septal perforator    - 100% LCx after a very small OM1.    - 100% proximal  RCA 2. Patent LIMA to the LAD 3. Patent free radial graft arising from the  mid LIMA graft and supplying the first diagonal and OM 4. SVG to RCA is known to be occluded. The distal RCA is supplied by collaterals.  5. Normal LV filling pressures 6. Mild pulmonary HTN. 7. Preserved cardiac output.  Plan: continue medical therapy for CHF. Will switch IV lasix to po. Stop IV Ntg and heparin.   EKG:  EKG is personally reviewed.  The ekg ordered 05/13/19 demonstrates NSR, iLBBB. Inferior/lateral ST changes unchanged from prior.  Recent Labs: 01/28/2019: Magnesium 1.7 01/30/2019: B Natriuretic Peptide 686.0 02/11/2019: Pro B Natriuretic peptide (BNP) 567.0; TSH 1.74 05/13/2019: ALT 19; BUN 42; Creatinine, Ser 1.76; Hemoglobin 12.9; Platelets 178.0; Potassium 4.9; Sodium 138  Recent Lipid Panel    Component Value Date/Time   CHOL 198 05/13/2019 1402   TRIG 169.0 (H) 05/13/2019 1402   HDL 43.90 05/13/2019 1402   CHOLHDL 5 05/13/2019 1402   VLDL 33.8 05/13/2019 1402   LDLCALC 120 (H) 05/13/2019 1402   LDLDIRECT 155.0 07/13/2017 1033    Physical Exam:    VS:  BP (!) 143/65   Pulse (!) 58   Temp (!) 97 F (36.1 C)   Ht 6' (1.829 m)   Wt 252 lb 12.8 oz (114.7 kg)   SpO2 97%   BMI 34.29 kg/m     Wt Readings from Last 3 Encounters:  06/04/19 252 lb 12.8 oz (114.7 kg)  05/13/19 255 lb 12.8 oz (116 kg)  05/13/19 257 lb (116.6 kg)   GEN: Well nourished, well developed in no acute distress HEENT: Normal, moist mucous membranes NECK: No JVD CARDIAC: regular rhythm, normal S1 and S2, no murmurs, rubs, gallops.  VASCULAR: Radial and DP pulses 2+ bilaterally. No carotid bruits RESPIRATORY:  Clear to auscultation without rales, wheezing or rhonchi  ABDOMEN: Soft, non-tender, non-distended MUSCULOSKELETAL:  Ambulates independently SKIN: Warm and dry, no edema NEUROLOGIC:  Alert and oriented x 3. No focal neuro deficits noted. PSYCHIATRIC:  Normal affect   ASSESSMENT:    1. Coronary  artery disease involving native coronary artery of native heart with other form of angina pectoris ([place])   2. Chronic combined systolic and diastolic heart failure ([place])   3. Ischemic cardiomyopathy   4. Essential hypertension, benign   5. Mixed hyperlipidemia   6. Hx of CABG   7. Type 2 diabetes mellitus with stage 3 chronic kidney disease, with long-term current use of insulin ([facility])    PLAN:    CAD s/p CABG 2006: known SVG-PDA is occluded, other grafts are patent.  -continues to take frequent (though less than prior) SL NG for shortness of breath -on aspirin 81 mg -changed from ticagrelor to clopidogrel due to cost and SOB. SOB only mildly improved. Needs DAPT until at least 10/2019 given NSTEMI -on metoprolol succinate 50 mg daily -on imdur 120 mg daily -started ranexa, unclear that this has significantly changed his symptoms -has sublingual nitroglycerin -counseled on life threatening hypotension with isosorbide, nitroglycerin and sildenafil. Would not use sildenafil given this. -using amlodipine 5 mg for both BP and antianginal effects  Chronic systolic and diastolic heart failure:  -NYHA class II symptoms today.  -difficult echo windows, but known to be 40-45% with grade 2 diastolic dysfunction; due to ischemic cardiomyopathy -continue metoprolol succinate 50 mg daily -was on entresto 49-51 mg BID but had to stop as he cannot afford due to the donut hole. Will start valsartan alone for now to get him on GDMT -continue lasix 80 mg as needed and potassium when he takes  lasix.  -educated on heart failure, including daily weights, sodium and fluid guidelines, signs/symptoms to watch for, diet and activity recommendations  Ideally, would like him on the following that he is not currently on due to cost: -entresto -sglt2 inhibitor such as jardiance given CAD and HF with diabetes -PCSK9i as he is not at goal on on atorvastatin 80 mg and unlikely to reach with addition of ezetimibe along  -vascepa given TG >150 and known CAD  Hyperlipidemia: -on atorvastatin 80 mg, with LDL goal <70.  - LDL 120. As he needs almost a 50% reduction in LDL, unlikely to achieve this with either change to rosuvastatin or adding ezetimibe, but not at goal. -have discussed PCSK9 inhibitors in the past. These would be ideal treatment option. Cost is a concern, and previously we balanced starting entresto vs. PCSK9i. Continue to address. Similar discussion re: vascepa for TG.  Type II diabetes, on insulin and metformin, with chronic kidney disease stage 3. Most recent I7P 8.3 -trulicity was too expensive for him -would like to use jardiance given CKD, obesity, and CAD, though concerned about cost. Will need to continue to prioritize medications so that they are affordable and allow adherence.  Plan for follow up: he is amenable to PharmD referral again to discuss medication options/cost. He would like to know if he can get any of the above listed meds with a discount program. We discussed referral to our social work, but it is his hope that if he can get through the end of the year donut hole, he could afford meds next year.   I will see him back in January 2021 or sooner PRN.  Medication Adjustments/Labs and Tests Ordered: Current medicines are reviewed at length with the patient today.  Concerns regarding medicines are outlined above.  No orders of the defined types were placed in this encounter.  Meds ordered this encounter  Medications  . valsartan (DIOVAN) 40 MG tablet    Sig: Take 1 tablet (40 mg total) by mouth daily.    Dispense:  30 tablet    Refill:  11    Patient Instructions  Medication Instructions:  Stop: Entresto Start: Valsartan 40 mg daily If you need a refill on your cardiac medications before your next appointment, please call your pharmacy.   Lab work: None  Testing/Procedures: None  Follow-Up: At [facility], you and your health needs are our priority.  As part  of our continuing mission to provide you with exceptional heart care, we have created designated Provider Care Teams.  These Care Teams include your primary Cardiologist (physician) and Advanced Practice Providers (APPs -  Physician Assistants and Nurse Practitioners) who all work together to provide you with the care you need, when you need it. You will need a follow up appointment in 4 months.  Please call our office 2 months in advance to schedule this appointment.  You may see Buford Dresser, MD or one of the following Advanced Practice Providers on your designated Care Team:   Rosaria Ferries, PA-C . Jory Sims, DNP, ANP  Your physician recommends that you schedule a follow-up appointment in 2 weeks with Pharm D.       Signed, Buford Dresser, MD PhD 06/04/2019   [place]

## 2019-06-04 NOTE — Patient Instructions (Signed)
Medication Instructions:  Stop: Entresto Start: Valsartan 40 mg daily If you need a refill on your cardiac medications before your next appointment, please call your pharmacy.   Lab work: None  Testing/Procedures: None  Follow-Up: At [facility], you and your health needs are our priority.  As part of our continuing mission to provide you with exceptional heart care, we have created designated Provider Care Teams.  These Care Teams include your primary Cardiologist (physician) and Advanced Practice Providers (APPs -  Physician Assistants and Nurse Practitioners) who all work together to provide you with the care you need, when you need it. You will need a follow up appointment in 4 months.  Please call our office 2 months in advance to schedule this appointment.  You may see Buford Dresser, MD or one of the following Advanced Practice Providers on your designated Care Team:   Rosaria Ferries, PA-C . Jory Sims, DNP, ANP  Your physician recommends that you schedule a follow-up appointment in 2 weeks with Pharm D.

## 2019-06-25 ENCOUNTER — Ambulatory Visit (INDEPENDENT_AMBULATORY_CARE_PROVIDER_SITE_OTHER): Payer: Medicare Other | Admitting: Pharmacist

## 2019-06-25 ENCOUNTER — Other Ambulatory Visit: Payer: Self-pay

## 2019-06-25 VITALS — BP 128/60 | HR 60 | Ht 72.0 in | Wt 255.4 lb

## 2019-06-25 DIAGNOSIS — E782 Mixed hyperlipidemia: Secondary | ICD-10-CM | POA: Diagnosis not present

## 2019-06-25 DIAGNOSIS — I5042 Chronic combined systolic (congestive) and diastolic (congestive) heart failure: Secondary | ICD-10-CM

## 2019-06-25 MED ORDER — EZETIMIBE 10 MG PO TABS: 10.0000 mg | ORAL_TABLET | Freq: Every evening | ORAL | 1 refills | Status: DC

## 2019-06-25 MED ORDER — SACUBITRIL-VALSARTAN 49-51 MG PO TABS: 1.0000 | ORAL_TABLET | Freq: Two times a day (BID) | ORAL | 0 refills | Status: DC

## 2019-06-25 MED ORDER — FUROSEMIDE 80 MG PO TABS: 40.0000 mg | ORAL_TABLET | Freq: Every day | ORAL | 3 refills | Status: DC | PRN

## 2019-06-25 MED ORDER — ROSUVASTATIN CALCIUM 40 MG PO TABS: 40.0000 mg | ORAL_TABLET | Freq: Every evening | ORAL | 3 refills | Status: DC

## 2019-06-25 MED ORDER — JARDIANCE 10 MG PO TABS: 10.0000 mg | ORAL_TABLET | Freq: Every day | ORAL | 0 refills | Status: DC

## 2019-06-25 NOTE — Progress Notes (Signed)
Patient ID: [name]                 DOB: 1951-06-20                      MRN: [id number]     HPI: Russell Trevino is a 68 y.o. male referred by Dr. Elson Trevino to pharmacist clinic. PMH includes CAD s/p CABG, recent STEMI, EF 40-45%, DM, and hyperlipidemia. Patient is unable to afford Entresto and was initiated on valsartan 40mg  daily during last OV with DR Russell Trevino on 06/04/2019. Plan is to start [place], Vascepa, PCSK9i, and SGLT2 as soon as patient is able to afford.  Current  meds:  Amlodipine 5mg  daily Furosemide 80mg  daily as needed Isosorbide 120mg  daily Metoprolol succinate 50mg  daily Valsartan 40mg  daily  BP goal: 130/80  LDL goal < 70mg /dL  TG goal < 150mg /dL  Family History: family history includes Diabetes in his father. There is no history of Stomach cancer, Colon cancer, Rectal cancer, Esophageal cancer, or Pancreatic cancer.  Social History: denies tobacco use, reports occasional alcohol intake  Diet: low carbohydrate and low fat diet  Home BP readings: none available  Relevant lab results: 05/13/2019: CHO 198; TG 169; HDL 43; LDL-c 120 (atorvastatin 80mg ); A1C 8.2; Scr 1.76  Wt Readings from Last 3 Encounters:  06/25/19 255 lb 6.4 oz (115.8 kg)  06/04/19 252 lb 12.8 oz (114.7 kg)  05/13/19 255 lb 12.8 oz (116 kg)   BP Readings from Last 3 Encounters:  06/25/19 128/60  06/04/19 (!) 143/65  05/13/19 138/60   Pulse Readings from Last 3 Encounters:  06/25/19 60  06/04/19 (!) 58  05/13/19 (!) 29    Past Medical History:  Diagnosis Date  . BPH (benign prostatic hypertrophy)   . Cardiomyopathy, ischemic   . Coronary artery disease    at [facility], [place] 2009 (GRAFTS PATENT), cath 05/2018 w/ SVG-PDA 100%>>med rx  . Diabetes mellitus, type 2 (Russell Trevino)   . Frequency of urination   . GERD (gastroesophageal reflux disease)   . Gross hematuria   . History of CHF (congestive heart failure) 2007  . History of kidney stones   . History of myocardial  infarction 2006, 2019   2006>>CABG, 2019 cath w/ med rx  . History of urinary retention   . Hyperlipidemia   . Nocturia   . Renal calculi    BILATERAL PER CT SCAN--  NON-OBSTRUCTIVE  . S/P CABG x 4 2006    Current Outpatient Medications on File Prior to Visit  Medication Sig Dispense Refill  . amLODipine (NORVASC) 5 MG tablet Take 1 tablet (5 mg total) by mouth daily. 90 tablet 3  . aspirin 81 MG tablet Take 81 mg by mouth daily at 12 noon.     . clopidogrel (PLAVIX) 75 MG tablet Take 1 tablet (75 mg total) by mouth daily. 90 tablet 3  . finasteride (PROSCAR) 5 MG tablet Take 1 tablet (5 mg total) by mouth daily. (Patient taking differently: Take 5 mg by mouth daily at 12 noon. 12 noon) 90 tablet 1  . insulin NPH-regular Human (HUMULIN 70/30) (70-30) 100 UNIT/ML injection 90 units with breakfast, and 30 units with supper 50 mL 3  . isosorbide mononitrate (IMDUR) 120 MG 24 hr tablet Take 1 tablet (120 mg total) by mouth daily. 90 tablet 3  . metoprolol succinate (TOPROL-XL) 50 MG 24 hr tablet Take 1 tablet (50 mg total) by mouth daily at 12 noon. Take with or  immediately following a meal. 90 tablet 3  . pantoprazole (PROTONIX) 40 MG tablet Take 1 tablet (40 mg total) by mouth 2 (two) times a day. 90 tablet 3  . potassium chloride SA (K-DUR) 20 MEQ tablet Take 1 tablet (20 mEq total) by mouth daily as needed (when you take lasix). 90 tablet 1  . ranolazine (RANEXA) 500 MG 12 hr tablet Take 1 tablet (500 mg total) by mouth 2 (two) times daily. 180 tablet 3  . valsartan (DIOVAN) 40 MG tablet Take 1 tablet (40 mg total) by mouth daily. 30 tablet 11  . BD VEO INSULIN SYR ULTRAFINE 31G X 15/64" 1 ML MISC use as directed TO INJECT INSULIN  twice a day 10 each 0  . nitroGLYCERIN (NITROSTAT) 0.4 MG SL tablet DISSOLVE 1 TABLET UNDER THE TONGUE EVERY 5 MINUTES AS NEEDED FOR CHEST PAIN. CALL MD/911 IF TAKE 2 DOSES. MAX 3 DOSES/DAY 100 tablet 3   No current facility-administered medications on file prior  to visit.     No Known Allergies  Blood pressure 128/60, pulse 60, height 6' (1.829 m), weight 255 lb 6.4 oz (115.8 kg), SpO2 96 %.  Chronic combined systolic and diastolic heart failure ([place]) 1. Patient was able tot tolerate Entresto but had issues affording the medication. BP currently well controlled with valsartan alone but missing the benefits of ARNI therapy. Will discontinue valsartan, resume Entresto 49/51mg  BID. Samples of Entresto 49/51mg  #28 days were provided during this visit. Russell Trevino for 30 day free supply and patient assistance paperwork also given. Patient is to continue monitoring BP at home and continue proper hydration.  2. Jardiance 10mg  (SGLT2) was also initiated today with 28 day samples provided, 30 day free Russell Trevino and patient assistance paperwork.  Patient was educated about keeping proper hydration, decrease furosemide dose to 40mg  and monitor s/sx of fungal infection or UTI.   Plan to repeat BMEt in 4 weeks during f/u appointment with DR Russell Trevino.   Mixed hyperlipidemia LDL and TG were both above goal for secondary prevention. Patient is currently on rosuvastatin 40mg  daily and reports compliance with therapy and low carbohydrate diet.  Will add ezetimibe 10mg  daily to current therapy, and follow up fasting blood work in 3-4 months. Plan to add Vascepa and PCSK9i in the near future if TG and LDL remain above goal and patient is able to afford therapy.  PCSK9i indication, administration, PA process, storage, and patient assistance programs were discussed but not additional therapy was added today.  Russell Trevino PharmD, BCPS, [place] [address] [place] [address] 06/27/2019 5:34 PM

## 2019-06-25 NOTE — Patient Instructions (Addendum)
Return for a  follow up appointment in 4 weeks with DR Harrell Gave  Check your blood pressure at home daily (if able) and keep record of the readings.  Take your BP meds as follows:  *START Jardiance 10mg  daily in the morning* *START ezetimibe 10mg  daily with supper* *START Entresto 48/51 twice daily after valsartan 40mg  all gone* *DECREASE furosemide to 40mg  daily as needed for fluid retention* *COMPLETE patient assistance forms for Entresto and Jardiance*  *STAY hydrated* *REPEAT blood work during follow up visit DR Harrell Gave in November*  Bring all of your meds, your BP cuff and your record of home blood pressures to your next appointment.  Exercise as you're able, try to walk approximately 30 minutes per day.  Keep salt intake to a minimum, especially watch canned and prepared boxed foods.  Eat more fresh fruits and vegetables and fewer canned items.  Avoid eating in fast food restaurants.    HOW TO TAKE YOUR BLOOD PRESSURE: . Rest 5 minutes before taking your blood pressure. .  Don't smoke or drink caffeinated beverages for at least 30 minutes before. . Take your blood pressure before (not after) you eat. . Sit comfortably with your back supported and both feet on the floor (don't cross your legs). . Elevate your arm to heart level on a table or a desk. . Use the proper sized cuff. It should fit smoothly and snugly around your bare upper arm. There should be enough room to slip a fingertip under the cuff. The bottom edge of the cuff should be 1 inch above the crease of the elbow. . Ideally, take 3 measurements at one sitting and record the average.

## 2019-06-27 ENCOUNTER — Encounter: Payer: Self-pay | Admitting: Pharmacist

## 2019-06-27 NOTE — Assessment & Plan Note (Signed)
LDL and TG were both above goal for secondary prevention. Patient is currently on rosuvastatin 40mg  daily and reports compliance with therapy and low carbohydrate diet.  Will add ezetimibe 10mg  daily to current therapy, and follow up fasting blood work in 3-4 months. Plan to add Vascepa and PCSK9i in the near future if TG and LDL remain above goal and patient is able to afford therapy.  PCSK9i indication, administration, PA process, storage, and patient assistance programs were discussed but not additional therapy was added today.

## 2019-06-27 NOTE — Assessment & Plan Note (Signed)
1. Patient was able tot tolerate Entresto but had issues affording the medication. BP currently well controlled with valsartan alone but missing the benefits of ARNI therapy. Will discontinue valsartan, resume Entresto 49/51mg  BID. Samples of Entresto 49/51mg  #28 days were provided during this visit. [place] for 30 day free supply and patient assistance paperwork also given. Patient is to continue monitoring BP at home and continue proper hydration.  2. Jardiance 10mg  (SGLT2) was also initiated today with 28 day samples provided, 30 day free [place] and patient assistance paperwork.  Patient was educated about keeping proper hydration, decrease furosemide dose to 40mg  and monitor s/sx of fungal infection or UTI.   Plan to repeat BMEt in 4 weeks during f/u appointment with DR Harrell Gave.

## 2019-07-01 ENCOUNTER — Ambulatory Visit: Payer: Medicare Other | Admitting: Cardiology

## 2019-07-08 ENCOUNTER — Other Ambulatory Visit: Payer: Self-pay | Admitting: Internal Medicine

## 2019-07-19 ENCOUNTER — Other Ambulatory Visit: Payer: Self-pay

## 2019-07-19 ENCOUNTER — Ambulatory Visit: Payer: Medicare Other | Admitting: Cardiology

## 2019-07-19 ENCOUNTER — Encounter: Payer: Self-pay | Admitting: Cardiology

## 2019-07-19 VITALS — BP 132/63 | HR 65 | Temp 97.7°F | Ht 72.0 in | Wt 259.0 lb

## 2019-07-19 DIAGNOSIS — I255 Ischemic cardiomyopathy: Secondary | ICD-10-CM | POA: Diagnosis not present

## 2019-07-19 DIAGNOSIS — I10 Essential (primary) hypertension: Secondary | ICD-10-CM

## 2019-07-19 DIAGNOSIS — Z951 Presence of aortocoronary bypass graft: Secondary | ICD-10-CM

## 2019-07-19 DIAGNOSIS — Z79899 Other long term (current) drug therapy: Secondary | ICD-10-CM

## 2019-07-19 DIAGNOSIS — Z794 Long term (current) use of insulin: Secondary | ICD-10-CM

## 2019-07-19 DIAGNOSIS — E1159 Type 2 diabetes mellitus with other circulatory complications: Secondary | ICD-10-CM

## 2019-07-19 DIAGNOSIS — N184 Chronic kidney disease, stage 4 (severe): Secondary | ICD-10-CM

## 2019-07-19 DIAGNOSIS — I5042 Chronic combined systolic (congestive) and diastolic (congestive) heart failure: Secondary | ICD-10-CM

## 2019-07-19 DIAGNOSIS — I25119 Atherosclerotic heart disease of native coronary artery with unspecified angina pectoris: Secondary | ICD-10-CM | POA: Diagnosis not present

## 2019-07-19 NOTE — Patient Instructions (Addendum)
Medication Instructions:  Double Imdur to 240 mg daily to see if symptoms improve.   *If you need a refill on your cardiac medications before your next appointment, please call your pharmacy*  Lab Work: Your physician recommends that you return for lab work today (BMP)  If you have labs (blood work) drawn today and your tests are completely normal, you will receive your results only by: Marland Kitchen MyChart Message (if you have MyChart) OR . A paper copy in the mail If you have any lab test that is abnormal or we need to change your treatment, we will call you to review the results.  Testing/Procedures: None  Follow-Up: At [facility], you and your health needs are our priority.  As part of our continuing mission to provide you with exceptional heart care, we have created designated Provider Care Teams.  These Care Teams include your primary Cardiologist (physician) and Advanced Practice Providers (APPs -  Physician Assistants and Nurse Practitioners) who all work together to provide you with the care you need, when you need it.  Your next appointment:   6 weeks  The format for your next appointment:   In Person  Provider:   Buford Dresser, MD

## 2019-07-19 NOTE — Progress Notes (Signed)
Cardiology Office Note:    Date:  07/19/2019   ID:  Russell Trevino, DOB 01-09-1951, MRN [id number]  PCP:  Russell Rail, MD  Cardiologist:  Russell Dresser, MD PhD  Referring MD: Russell Rail, MD   CC: follow up   History of Present Illness:    Russell Trevino is a 68 y.o. male with a hx of CAD s/p prior CABG, recent NSTEMI, ischemic cardiomyopathy with EF 40-45% who is seen for heart failure post hospitalization follow up. He was discharged from the hospital on 01/30/19. Medication reconciliation completed at this visit as well.  Cardiac history: Hospitalized 10/2018 for NSTEMI and heart failure symptoms. Severe native CAD, no intervenable targets on cath. Recommended for medication management. Prior history notable for NSTEMI in 2006 that lead to CABG, with LIMA-LAD, L Radial-OM-D1, SVG-PDA. Noted to have ischemic cardiomyopathy with EF 40-45%. He was readmitted and discharged with heart failure after running out of furosemide for two weeks.    Today: Feels the same. Was hoping he would feel better by now. Hasn't started entresto back yet, just ran out of valsartan but supposed to be getting entresto any day.   Breathing still limited when he exerts himself. Now taking nitro once every other day, which is an improvement for him. Feels like he cannot exercise due to his limitations in breathing.   Weights fluctuating at home. Range has been 240-250 lbs. Hasn't moved out of that range at all. Legs slightly more swollen even though weight is stable. He is frustrated that his weight isn't going down despite his feeling that he is eating a good diet.   Blood pressures at home have been running high 120s/60-70s. Not sure of his pulse.   Notes leg swelling more in left leg than right. Best response was with full 80 mg furosemide once daily. Will check BMET today and discuss options for management. Discussed choices at length today, see below.  Past Medical History:  Diagnosis Date   . BPH (benign prostatic hypertrophy)   . Cardiomyopathy, ischemic   . Coronary artery disease    at [facility], [place] 2009 (GRAFTS PATENT), cath 05/2018 w/ SVG-PDA 100%>>med rx  . Diabetes mellitus, type 2 ([place])   . Frequency of urination   . GERD (gastroesophageal reflux disease)   . Gross hematuria   . History of CHF (congestive heart failure) 2007  . History of kidney stones   . History of myocardial infarction 2006, 2019   2006>>CABG, 2019 cath w/ med rx  . History of urinary retention   . Hyperlipidemia   . Nocturia   . Renal calculi    BILATERAL PER CT SCAN--  NON-OBSTRUCTIVE  . S/P CABG x 4 2006    Past Surgical History:  Procedure Laterality Date  . CARDIAC CATHETERIZATION  01/17/2008   GRAFTS PATENT / EF 40%,  DR SANTOS ([facility])  . CORONARY ARTERY BYPASS GRAFT  04/2005   LIMA-LAD, L radial-OM-D1 and SVG-PDA  . CYSTO / RIGHT URETERAL STENT PLACEMENT  10-08-1999  . CYSTO/  REMOVAL BLADDER STONES  01-14-2010  . CYSTOSCOPY  10/16/2012   Procedure: CYSTOSCOPY;  Surgeon: Malka So, MD;  Location: [facility];  Service: Urology;  Laterality: N/A;  Cystoscopy with Clot Evacuation and fulguration bladder neck.  Marland Kitchen EXTRACORPOREAL SHOCK WAVE LITHOTRIPSY  10-15-2009   RIGHT  . KNEE ARTHROSCOPY  1978   RIGHT  . LEFT SHOULDER SURGERY   2002  . LUMBAR FUSION  09-10-2008   L4 -  L5  . PERCUTANEOUS NEPHROSTOLITHOTOMY  1979  . PROSTATE BIOPSY  10/16/2012   Procedure: PROSTATE BIOPSY;  Surgeon: Malka So, MD;  Location: [facility];  Service: Urology;  Laterality: N/A;  Through ultrasound.  Marland Kitchen RIGHT/LEFT HEART CATH AND CORONARY/GRAFT ANGIOGRAPHY N/A 10/26/2018   Procedure: RIGHT/LEFT HEART CATH AND CORONARY/GRAFT ANGIOGRAPHY;  Surgeon: Martinique, Peter M, MD;  Location: [place];  Service: Cardiovascular;  Laterality: N/A;  . SHOULDER ARTHROSCOPY W/ SUBACROMIAL DECOMPRESSION AND DISTAL CLAVICLE EXCISION  10-11-2004   AND PARTIAL ROTATOR CUFF REPAIR   . TRANSTHORACIC ECHOCARDIOGRAM  25/42/7062   LV SYSTOLIC FUNCTION MILDLY REDUCED/ EF 48%/ LV FILLING PATTERN  IS IMPAIRED/ HYPOKINESIS IN THE MID AND BASALAR SEPTUM & ANTEROSEPTUM  . TURP VAPORIZATION  01-19-2010   BPH W/ BOO  . URETEROLITHOTOMY  1976    Current Medications: Current Outpatient Medications on File Prior to Visit  Medication Sig  . amLODipine (NORVASC) 5 MG tablet Take 1 tablet (5 mg total) by mouth daily.  Marland Kitchen aspirin 81 MG tablet Take 81 mg by mouth daily at 12 noon.   . BD VEO INSULIN SYR ULTRAFINE 31G X 15/64" 1 ML MISC use as directed TO INJECT INSULIN  twice a day  . clopidogrel (PLAVIX) 75 MG tablet Take 1 tablet (75 mg total) by mouth daily.  . empagliflozin (JARDIANCE) 10 MG TABS tablet Take 10 mg by mouth daily before breakfast.  . ezetimibe (ZETIA) 10 MG tablet Take 1 tablet (10 mg total) by mouth every evening.  . finasteride (PROSCAR) 5 MG tablet TAKE 1 TABLET(5 MG) BY MOUTH DAILY  . furosemide (LASIX) 80 MG tablet Take 0.5 tablets (40 mg total) by mouth daily as needed (If weight increases 3 lbs in one day or 5 lbs in one week). TAKE ON EMPTY STOMACH  . insulin NPH-regular Human (HUMULIN 70/30) (70-30) 100 UNIT/ML injection 90 units with breakfast, and 30 units with supper  . isosorbide mononitrate (IMDUR) 120 MG 24 hr tablet Take 1 tablet (120 mg total) by mouth daily.  . metoprolol succinate (TOPROL-XL) 50 MG 24 hr tablet Take 1 tablet (50 mg total) by mouth daily at 12 noon. Take with or immediately following a meal.  . nitroGLYCERIN (NITROSTAT) 0.4 MG SL tablet DISSOLVE 1 TABLET UNDER THE TONGUE EVERY 5 MINUTES AS NEEDED FOR CHEST PAIN. CALL MD/911 IF TAKE 2 DOSES. MAX 3 DOSES/DAY  . pantoprazole (PROTONIX) 40 MG tablet Take 1 tablet (40 mg total) by mouth 2 (two) times a day.  . potassium chloride SA (K-DUR) 20 MEQ tablet Take 1 tablet (20 mEq total) by mouth daily as needed (when you take lasix).  . ranolazine (RANEXA) 500 MG 12 hr tablet Take 1 tablet (500  mg total) by mouth 2 (two) times daily.  . rosuvastatin (CRESTOR) 40 MG tablet Take 1 tablet (40 mg total) by mouth every evening.  . sacubitril-valsartan (ENTRESTO) 49-51 MG Take 1 tablet by mouth 2 (two) times daily. To replace valsartan  . valsartan (DIOVAN) 40 MG tablet Take 1 tablet (40 mg total) by mouth daily.   No current facility-administered medications on file prior to visit.      Allergies:   Patient has no known allergies.   Social History   Socioeconomic History  . Marital status: Married    Spouse name: Not on file  . Number of children: 4  . Years of education: Not on file  . Highest education level: Not on file  Occupational History  .  Not on file  Social Needs  . Financial resource strain: Not on file  . Food insecurity    Worry: Not on file    Inability: Not on file  . Transportation needs    Medical: Not on file    Non-medical: Not on file  Tobacco Use  . Smoking status: Never Smoker  . Smokeless tobacco: Never Used  Substance and Sexual Activity  . Alcohol use: Yes    Comment: Occ.   . Drug use: No  . Sexual activity: Not on file  Lifestyle  . Physical activity    Days per week: Not on file    Minutes per session: Not on file  . Stress: Not on file  Relationships  . Social Herbalist on phone: Not on file    Gets together: Not on file    Attends religious service: Not on file    Active member of club or organization: Not on file    Attends meetings of clubs or organizations: Not on file    Relationship status: Not on file  Other Topics Concern  . Not on file  Social History Narrative   Lives in [place], [place], has family and friends in [place].     Family History: The patient's family history includes Diabetes in his father. There is no history of Stomach cancer, Colon cancer, Rectal cancer, Esophageal cancer, or Pancreatic cancer.  ROS:   Please see the history of present illness.  Additional pertinent ROS: Constitutional:  Negative for chills, fever, night sweats, unintentional weight loss  HENT: Negative for ear pain and hearing loss.   Eyes: Negative for loss of vision and eye pain.  Respiratory: Negative for cough, sputum, wheezing.   Cardiovascular: See HPI. Gastrointestinal: Negative for abdominal pain, melena, and hematochezia.  Genitourinary: Negative for dysuria and hematuria.  Musculoskeletal: Negative for falls and myalgias.  Skin: Negative for itching and rash.  Neurological: Negative for focal weakness, focal sensory changes and loss of consciousness.  Endo/Heme/Allergies: Does not bruise/bleed easily.    EKGs/Labs/Other Studies Reviewed:    The following studies were reviewed today: Echo 10/26/18 1. The left ventricle has mild-moderately reduced systolic function, with an ejection fraction of 40-45%. The cavity size was severely dilated. Left ventricular diastolic Doppler parameters are consistent with pseudonormalization Elevated left ventricular end-diastolic pressure.  2. Poor images preclude accurate assessment of wall motion even with definity.  3. The right ventricle has normal systolic function. The cavity was normal. There is no increase in right ventricular wall thickness.  4. Left atrial size was moderately dilated.  5. The mitral valve is normal in structure. Mitral valve regurgitation is moderate by color flow Doppler.  6. The tricuspid valve is normal in structure.  7. The aortic valve is tricuspid.  8. The pulmonic valve was normal in structure.  9. There is mild dilatation of the ascending aorta measuring 38 mm. 10. Right atrial pressure is estimated at 3 mmHg.  R/LHC 10/26/18  Mid LM to Dist LM lesion is 95% stenosed.  Prox LAD lesion is 100% stenosed.  Prox Cx lesion is 100% stenosed.  Prox RCA to Dist RCA lesion is 100% stenosed.  LV end diastolic pressure is normal.  Hemodynamic findings consistent with mild pulmonary hypertension.   1. Occlusive native vessel  CAD.     -95% distal left main    - 100% proximal LAD after the first septal perforator    - 100% LCx after a very small  OM1.    - 100% proximal RCA 2. Patent LIMA to the LAD 3. Patent free radial graft arising from the mid LIMA graft and supplying the first diagonal and OM 4. SVG to RCA is known to be occluded. The distal RCA is supplied by collaterals.  5. Normal LV filling pressures 6. Mild pulmonary HTN. 7. Preserved cardiac output.  Plan: continue medical therapy for CHF. Will switch IV lasix to po. Stop IV Ntg and heparin.   EKG:  EKG is personally reviewed.  The ekg ordered 05/13/19 demonstrates NSR, iLBBB. Inferior/lateral ST changes unchanged from prior.  Recent Labs: 01/28/2019: Magnesium 1.7 01/30/2019: B Natriuretic Peptide 686.0 02/11/2019: Pro B Natriuretic peptide (BNP) 567.0; TSH 1.74 05/13/2019: ALT 19; BUN 42; Creatinine, Ser 1.76; Hemoglobin 12.9; Platelets 178.0; Potassium 4.9; Sodium 138  Recent Lipid Panel    Component Value Date/Time   CHOL 198 05/13/2019 1402   TRIG 169.0 (H) 05/13/2019 1402   HDL 43.90 05/13/2019 1402   CHOLHDL 5 05/13/2019 1402   VLDL 33.8 05/13/2019 1402   LDLCALC 120 (H) 05/13/2019 1402   LDLDIRECT 155.0 07/13/2017 1033    Physical Exam:    VS:  BP 132/63   Pulse 65   Temp 97.7 F (36.5 C)   Ht 6' (1.829 m)   Wt 259 lb (117.5 kg)   SpO2 96%   BMI 35.13 kg/m     Wt Readings from Last 3 Encounters:  07/19/19 259 lb (117.5 kg)  06/25/19 255 lb 6.4 oz (115.8 kg)  06/04/19 252 lb 12.8 oz (114.7 kg)   GEN: Well nourished, well developed in no acute distress HEENT: Normal, moist mucous membranes NECK: No JVD CARDIAC: regular rhythm, normal S1 and S2, no rubs or gallops. No murmur. VASCULAR: Radial and DP pulses 2+ bilaterally. No carotid bruits RESPIRATORY:  Clear to auscultation without rales, wheezing or rhonchi  ABDOMEN: Soft, non-tender, non-distended MUSCULOSKELETAL:  Ambulates independently SKIN: Warm and dry, no  significant edema NEUROLOGIC:  Alert and oriented x 3. No focal neuro deficits noted. PSYCHIATRIC:  Normal affect   ASSESSMENT:    1. Coronary artery disease involving native coronary artery of native heart with angina pectoris ([place])   2. Medication management   3. Ischemic cardiomyopathy   4. Hx of CABG   5. Chronic combined systolic and diastolic heart failure ([place])   6. Essential hypertension, benign   7. Type 2 diabetes mellitus with other circulatory complication, with long-term current use of insulin ([place])   8. CKD (chronic kidney disease) stage 4, GFR 15-29 ml/min (H[facility])    PLAN:    CAD s/p CABG 2006: known SVG-PDA is occluded, other grafts are patent. No intervenable targets.  Continues to struggle. Doesn't feel like he is improving, though he is using less nitroglycerin. Discussed at length today. Offered the following:  Trial 1: increase imdur to 240 mg for several days to see if it helps.  Trial 2: double amlodipine to 10 mg Trial 3: double metoprolol Trial 4: double ranexa.  -continue aspirin, clopidogrel (DAPT until 10/2019 given NSTEMI). No improvement in breathing with changing from ticagrelor -on metoprolol succinate 50 mg daily -on imdur 120 mg daily, trialing increase as above -on ranexa 500 mg BID -using amlodipine 5 mg for both BP and antianginal effects -has sublingual nitroglycerin -counseled on life threatening hypotension with isosorbide, nitroglycerin and sildenafil. Would not use sildenafil given this.  Chronic systolic and diastolic heart failure:  -NYHA class II symptoms today.  -difficult echo windows, but  known to be 40-45% with grade 2 diastolic dysfunction; due to ischemic cardiomyopathy -continue metoprolol succinate 50 mg daily -was on entresto 49-51 mg BID but had to stop as he cannot afford due to the donut hole. Briefly on valsartan, now going back to entresto -continue lasix 80 mg as needed and potassium when he takes lasix. Has CKD stage 4,  need to monitor renal function/potassium periodically -educated on heart failure, including daily weights, sodium and fluid guidelines, signs/symptoms to watch for, diet and activity recommendations -on empagliflozin (SGLT2i)  Hyperlipidemia: -on atorvastatin 80 mg, with LDL goal <70.  - LDL 120. As he needs almost a 50% reduction in LDL, unlikely to achieve this with either change to rosuvastatin or adding ezetimibe, but not at goal. Trialed adding ezetimibe, recheck lipids at follow up -have discussed PCSK9 inhibitors in the past. These would be ideal treatment option. Cost is a concern, and previously we balanced starting entresto vs. PCSK9i. Continue to address. Similar discussion re: vascepa for TG.  Type II diabetes, on insulin and metformin, with chronic kidney disease stage 3. Most recent X3G 8.3 -trulicity was too expensive for him -on jardiance now, as above  Follow up: 6 weeks  Medication Adjustments/Labs and Tests Ordered: Current medicines are reviewed at length with the patient today.  Concerns regarding medicines are outlined above.  Orders Placed This Encounter  Procedures  . Basic metabolic panel   No orders of the defined types were placed in this encounter.   Patient Instructions  Medication Instructions:  Double Imdur to 240 mg daily to see if symptoms improve.   *If you need a refill on your cardiac medications before your next appointment, please call your pharmacy*  Lab Work: Your physician recommends that you return for lab work today (BMP)  If you have labs (blood work) drawn today and your tests are completely normal, you will receive your results only by: Marland Kitchen MyChart Message (if you have MyChart) OR . A paper copy in the mail If you have any lab test that is abnormal or we need to change your treatment, we will call you to review the results.  Testing/Procedures: None  Follow-Up: At [facility], you and your health needs are our priority.  As part  of our continuing mission to provide you with exceptional heart care, we have created designated Provider Care Teams.  These Care Teams include your primary Cardiologist (physician) and Advanced Practice Providers (APPs -  Physician Assistants and Nurse Practitioners) who all work together to provide you with the care you need, when you need it.  Your next appointment:   6 weeks  The format for your next appointment:   In Person  Provider:   Buford Dresser, MD       Signed, Russell Dresser, MD PhD 07/19/2019   [place]

## 2019-07-20 LAB — BASIC METABOLIC PANEL
BUN/Creatinine Ratio: 14 (ref 10–24)
BUN: 31 mg/dL — ABNORMAL HIGH (ref 8–27)
CO2: 22 mmol/L (ref 20–29)
Calcium: 9.3 mg/dL (ref 8.6–10.2)
Chloride: 105 mmol/L (ref 96–106)
Creatinine, Ser: 2.2 mg/dL — ABNORMAL HIGH (ref 0.76–1.27)
GFR calc Af Amer: 34 mL/min/{1.73_m2} — ABNORMAL LOW (ref >59)
GFR calc non Af Amer: 29 mL/min/{1.73_m2} — ABNORMAL LOW (ref >59)
Glucose: 70 mg/dL (ref 65–99)
Potassium: 5.1 mmol/L (ref 3.5–5.2)
Sodium: 143 mmol/L (ref 134–144)

## 2019-07-24 ENCOUNTER — Telehealth: Payer: Self-pay | Admitting: Cardiology

## 2019-07-24 NOTE — Telephone Encounter (Signed)
New Message:     Left a message on pt's voicemail for pt to call and schedule an appointment with the pharmacist at [facility] in 2 to 3 weeks.

## 2019-08-04 ENCOUNTER — Encounter: Payer: Self-pay | Admitting: Cardiology

## 2019-08-04 DIAGNOSIS — N184 Chronic kidney disease, stage 4 (severe): Secondary | ICD-10-CM | POA: Insufficient documentation

## 2019-08-04 DIAGNOSIS — N183 Chronic kidney disease, stage 3 unspecified: Secondary | ICD-10-CM | POA: Insufficient documentation

## 2019-08-16 ENCOUNTER — Ambulatory Visit: Payer: Medicare Other

## 2019-08-28 ENCOUNTER — Ambulatory Visit: Payer: Medicare Other

## 2019-08-28 NOTE — Progress Notes (Deleted)
08/28/2019 Russell Trevino Feb 05, 1951 [id number]   HPI:  Russell Trevino is a 68 y.o. male patient of Dr Harrell Gave, with a [place] below who presents today for hypertension clinic evaluation.  He saw Dr. Harrell Gave last month and was noting that he still was not felling better.  Still has limited breathing with exertion and taking nitroglycerine at least every other day.  His weight has been stable and he continues to have some edema in his legs.    He had discontinued his Entresto due to costs, but was given samples at that visit with Dr. Harrell Gave.    Per medicare website, should have $47/mo cost for Delene Loll, $50 annual deductible  Past Medical History: CAD s/p CABG NSTEMI 2006 with CABG x 3, second NSTEMI 10/2018 w/no intervenable targets;   hypertension Controlled - restarted Entresto 49/51 at last visit, amlodipine 5 mg qd  hyperlipidemia 04/2019 - TC 198, TG 169, HLD 43.9, LDL 120 - on rosuvastatin 40   Chronic CHF (systolic and diastolic) 0/3474 - EF 25-95%  DM2 04/2019 A1c 8.2 on Jardiance, Humulin 70/30  CKD stage 3 07/2019 SCr 2.2, GFR 29, CrCl (wt 117.5 kg) 52.7 (w/IBW 35.8)     Blood Pressure Goal:  130/80  Current Medications:  Family Hx:  Social Hx:  Diet:  Exercise:  Home BP readings:  Intolerances:   Labs:  Wt Readings from Last 3 Encounters:  07/19/19 259 lb (117.5 kg)  06/25/19 255 lb 6.4 oz (115.8 kg)  06/04/19 252 lb 12.8 oz (114.7 kg)   BP Readings from Last 3 Encounters:  07/19/19 132/63  06/25/19 128/60  06/04/19 (!) 143/65   Pulse Readings from Last 3 Encounters:  07/19/19 65  06/25/19 60  06/04/19 (!) 58    Current Outpatient Medications  Medication Sig Dispense Refill  . amLODipine (NORVASC) 5 MG tablet Take 1 tablet (5 mg total) by mouth daily. 90 tablet 3  . aspirin 81 MG tablet Take 81 mg by mouth daily at 12 noon.     . BD VEO INSULIN SYR ULTRAFINE 31G X 15/64" 1 ML MISC use as directed TO INJECT INSULIN  twice a day 10  each 0  . clopidogrel (PLAVIX) 75 MG tablet Take 1 tablet (75 mg total) by mouth daily. 90 tablet 3  . empagliflozin (JARDIANCE) 10 MG TABS tablet Take 10 mg by mouth daily before breakfast. 30 tablet 0  . ezetimibe (ZETIA) 10 MG tablet Take 1 tablet (10 mg total) by mouth every evening. 90 tablet 1  . finasteride (PROSCAR) 5 MG tablet TAKE 1 TABLET(5 MG) BY MOUTH DAILY 90 tablet 1  . furosemide (LASIX) 80 MG tablet Take 0.5 tablets (40 mg total) by mouth daily as needed (If weight increases 3 lbs in one day or 5 lbs in one week). TAKE ON EMPTY STOMACH 90 tablet 3  . insulin NPH-regular Human (HUMULIN 70/30) (70-30) 100 UNIT/ML injection 90 units with breakfast, and 30 units with supper 50 mL 3  . isosorbide mononitrate (IMDUR) 120 MG 24 hr tablet Take 1 tablet (120 mg total) by mouth daily. 90 tablet 3  . metoprolol succinate (TOPROL-XL) 50 MG 24 hr tablet Take 1 tablet (50 mg total) by mouth daily at 12 noon. Take with or immediately following a meal. 90 tablet 3  . nitroGLYCERIN (NITROSTAT) 0.4 MG SL tablet DISSOLVE 1 TABLET UNDER THE TONGUE EVERY 5 MINUTES AS NEEDED FOR CHEST PAIN. CALL MD/911 IF TAKE 2 DOSES. MAX 3 DOSES/DAY 100  tablet 3  . pantoprazole (PROTONIX) 40 MG tablet Take 1 tablet (40 mg total) by mouth 2 (two) times a day. 90 tablet 3  . potassium chloride SA (K-DUR) 20 MEQ tablet Take 1 tablet (20 mEq total) by mouth daily as needed (when you take lasix). 90 tablet 1  . ranolazine (RANEXA) 500 MG 12 hr tablet Take 1 tablet (500 mg total) by mouth 2 (two) times daily. 180 tablet 3  . rosuvastatin (CRESTOR) 40 MG tablet Take 1 tablet (40 mg total) by mouth every evening. 90 tablet 3  . sacubitril-valsartan (ENTRESTO) 49-51 MG Take 1 tablet by mouth 2 (two) times daily. To replace valsartan 60 tablet 0   No current facility-administered medications for this visit.    No Known Allergies  Past Medical History:  Diagnosis Date  . BPH (benign prostatic hypertrophy)   .  Cardiomyopathy, ischemic   . Coronary artery disease    at [facility], [place] 2009 (GRAFTS PATENT), cath 05/2018 w/ SVG-PDA 100%>>med rx  . Diabetes mellitus, type 2 ([place])   . Frequency of urination   . GERD (gastroesophageal reflux disease)   . Gross hematuria   . History of CHF (congestive heart failure) 2007  . History of kidney stones   . History of myocardial infarction 2006, 2019   2006>>CABG, 2019 cath w/ med rx  . History of urinary retention   . Hyperlipidemia   . Nocturia   . Renal calculi    BILATERAL PER CT SCAN--  NON-OBSTRUCTIVE  . S/P CABG x 4 2006    There were no vitals taken for this visit.  No problem-specific Assessment & Plan notes found for this encounter.   Tommy Medal PharmD CPP [facility] [address] [place] [address], [place] [address] [phone]

## 2019-08-30 ENCOUNTER — Encounter: Payer: Self-pay | Admitting: Cardiology

## 2019-08-30 ENCOUNTER — Ambulatory Visit: Payer: Medicare Other | Admitting: Cardiology

## 2019-08-30 ENCOUNTER — Other Ambulatory Visit: Payer: Self-pay

## 2019-08-30 VITALS — BP 120/58 | HR 55 | Ht 72.0 in | Wt 254.0 lb

## 2019-08-30 DIAGNOSIS — E782 Mixed hyperlipidemia: Secondary | ICD-10-CM

## 2019-08-30 DIAGNOSIS — Z951 Presence of aortocoronary bypass graft: Secondary | ICD-10-CM | POA: Diagnosis not present

## 2019-08-30 DIAGNOSIS — I255 Ischemic cardiomyopathy: Secondary | ICD-10-CM

## 2019-08-30 DIAGNOSIS — I5042 Chronic combined systolic (congestive) and diastolic (congestive) heart failure: Secondary | ICD-10-CM

## 2019-08-30 DIAGNOSIS — I25119 Atherosclerotic heart disease of native coronary artery with unspecified angina pectoris: Secondary | ICD-10-CM

## 2019-08-30 MED ORDER — ISOSORBIDE MONONITRATE ER 120 MG PO TB24: 120.0000 mg | ORAL_TABLET | Freq: Two times a day (BID) | ORAL | 3 refills | Status: DC

## 2019-08-30 MED ORDER — FUROSEMIDE 80 MG PO TABS: 80.0000 mg | ORAL_TABLET | Freq: Every day | ORAL | 3 refills | Status: DC | PRN

## 2019-08-30 NOTE — Progress Notes (Signed)
Cardiology Office Note:    Date:  08/30/2019   ID:  Russell Trevino, DOB 11-Oct-1950, MRN [id number]  PCP:  Binnie Rail, MD  Cardiologist:  Buford Dresser, MD PhD  Referring MD: Binnie Rail, MD   CC: follow up   History of Present Illness:    Russell Trevino is a 68 y.o. male with a hx of CAD s/p prior CABG, recent NSTEMI, ischemic cardiomyopathy with EF 40-45% who is seen for follow up.   Cardiac history: Hospitalized 10/2018 for NSTEMI and heart failure symptoms. Severe native CAD, no intervenable targets on cath. Recommended for medication management. Prior history notable for NSTEMI in 2006 that lead to CABG, with LIMA-LAD, L Radial-OM-D1, SVG-PDA. Noted to have ischemic cardiomyopathy with EF 40-45%. He was readmitted and discharged with heart failure after running out of furosemide for two weeks.    Today: Confused about his medication regimen, reviewed medications at length today.  Taking aspirin, clopidogrel, ezetimibe, rosuvastatin as ordered.  Amlodipine, imdur, metoprolol, ranolazine reviewed (anti-anginals). Has been taking imdur twice daily, better than once a day dosing. No dizziness or lightheadedness. Took no SL NG last week, but the week before was taking SL NG 2-3 times/day.  Has entresto, hasn't taken it but does have it. Is taking valsartan alone. Discussed making this change today. Reviewed metoprolol as well.  Went back on furosemide after he started having fluid. Was on it back for a week, improved, now every other day seems to be stable. Needed less SL NG once fluid came off. Didn't have much urine output on 40 mg dose but did well on 80 mg dose.  Having a lot of belching, continues with shortness of breath, angina--he is frustrated and wants to know if it will always be this way. We spoke at length about both cardiac and noncardiac causes.   Past Medical History:  Diagnosis Date  . BPH (benign prostatic hypertrophy)   . Cardiomyopathy, ischemic     . Coronary artery disease    at [facility], [place] 2009 (GRAFTS PATENT), cath 05/2018 w/ SVG-PDA 100%>>med rx  . Diabetes mellitus, type 2 ([place])   . Frequency of urination   . GERD (gastroesophageal reflux disease)   . Gross hematuria   . History of CHF (congestive heart failure) 2007  . History of kidney stones   . History of myocardial infarction 2006, 2019   2006>>CABG, 2019 cath w/ med rx  . History of urinary retention   . Hyperlipidemia   . Nocturia   . Renal calculi    BILATERAL PER CT SCAN--  NON-OBSTRUCTIVE  . S/P CABG x 4 2006    Past Surgical History:  Procedure Laterality Date  . CARDIAC CATHETERIZATION  01/17/2008   GRAFTS PATENT / EF 40%,  DR SANTOS ([facility])  . CORONARY ARTERY BYPASS GRAFT  04/2005   LIMA-LAD, L radial-OM-D1 and SVG-PDA  . CYSTO / RIGHT URETERAL STENT PLACEMENT  10-08-1999  . CYSTO/  REMOVAL BLADDER STONES  01-14-2010  . CYSTOSCOPY  10/16/2012   Procedure: CYSTOSCOPY;  Surgeon: Malka So, MD;  Location: [facility];  Service: Urology;  Laterality: N/A;  Cystoscopy with Clot Evacuation and fulguration bladder neck.  Marland Kitchen EXTRACORPOREAL SHOCK WAVE LITHOTRIPSY  10-15-2009   RIGHT  . KNEE ARTHROSCOPY  1978   RIGHT  . LEFT SHOULDER SURGERY   2002  . LUMBAR FUSION  09-10-2008   L4 - L5  . PERCUTANEOUS NEPHROSTOLITHOTOMY  1979  . PROSTATE BIOPSY  10/16/2012  Procedure: PROSTATE BIOPSY;  Surgeon: Malka So, MD;  Location: [facility];  Service: Urology;  Laterality: N/A;  Through ultrasound.  Marland Kitchen RIGHT/LEFT HEART CATH AND CORONARY/GRAFT ANGIOGRAPHY N/A 10/26/2018   Procedure: RIGHT/LEFT HEART CATH AND CORONARY/GRAFT ANGIOGRAPHY;  Surgeon: Martinique, Peter M, MD;  Location: [facility];  Service: Cardiovascular;  Laterality: N/A;  . SHOULDER ARTHROSCOPY W/ SUBACROMIAL DECOMPRESSION AND DISTAL CLAVICLE EXCISION  10-11-2004   AND PARTIAL ROTATOR CUFF REPAIR  . TRANSTHORACIC ECHOCARDIOGRAM  33/00/7622   LV SYSTOLIC FUNCTION  MILDLY REDUCED/ EF 48%/ LV FILLING PATTERN  IS IMPAIRED/ HYPOKINESIS IN THE MID AND BASALAR SEPTUM & ANTEROSEPTUM  . TURP VAPORIZATION  01-19-2010   BPH W/ BOO  . URETEROLITHOTOMY  1976    Current Medications: Current Outpatient Medications on File Prior to Visit  Medication Sig  . amLODipine (NORVASC) 5 MG tablet Take 1 tablet (5 mg total) by mouth daily.  Marland Kitchen aspirin 81 MG tablet Take 81 mg by mouth daily at 12 noon.   . BD VEO INSULIN SYR ULTRAFINE 31G X 15/64" 1 ML MISC use as directed TO INJECT INSULIN  twice a day  . clopidogrel (PLAVIX) 75 MG tablet Take 1 tablet (75 mg total) by mouth daily.  . empagliflozin (JARDIANCE) 10 MG TABS tablet Take 10 mg by mouth daily before breakfast.  . ezetimibe (ZETIA) 10 MG tablet Take 1 tablet (10 mg total) by mouth every evening.  . finasteride (PROSCAR) 5 MG tablet TAKE 1 TABLET(5 MG) BY MOUTH DAILY  . furosemide (LASIX) 80 MG tablet Take 0.5 tablets (40 mg total) by mouth daily as needed (If weight increases 3 lbs in one day or 5 lbs in one week). TAKE ON EMPTY STOMACH  . insulin NPH-regular Human (HUMULIN 70/30) (70-30) 100 UNIT/ML injection 90 units with breakfast, and 30 units with supper  . isosorbide mononitrate (IMDUR) 120 MG 24 hr tablet Take 1 tablet (120 mg total) by mouth daily.  . metoprolol succinate (TOPROL-XL) 50 MG 24 hr tablet Take 1 tablet (50 mg total) by mouth daily at 12 noon. Take with or immediately following a meal.  . nitroGLYCERIN (NITROSTAT) 0.4 MG SL tablet DISSOLVE 1 TABLET UNDER THE TONGUE EVERY 5 MINUTES AS NEEDED FOR CHEST PAIN. CALL MD/911 IF TAKE 2 DOSES. MAX 3 DOSES/DAY  . pantoprazole (PROTONIX) 40 MG tablet Take 1 tablet (40 mg total) by mouth 2 (two) times a day.  . potassium chloride SA (K-DUR) 20 MEQ tablet Take 1 tablet (20 mEq total) by mouth daily as needed (when you take lasix).  . ranolazine (RANEXA) 500 MG 12 hr tablet Take 1 tablet (500 mg total) by mouth 2 (two) times daily.  . rosuvastatin (CRESTOR) 40  MG tablet Take 1 tablet (40 mg total) by mouth every evening.  . sacubitril-valsartan (ENTRESTO) 49-51 MG Take 1 tablet by mouth 2 (two) times daily. To replace valsartan  . valsartan (DIOVAN) 40 MG tablet Take 40 mg by mouth daily.   No current facility-administered medications on file prior to visit.     Allergies:   Patient has no known allergies.   Social History   Tobacco Use  . Smoking status: Never Smoker  . Smokeless tobacco: Never Used  Substance Use Topics  . Alcohol use: Yes    Comment: Occ.   . Drug use: No    Family History: The patient's family history includes Diabetes in his father. There is no history of Stomach cancer, Colon cancer, Rectal cancer, Esophageal  cancer, or Pancreatic cancer.  ROS:   Please see the history of present illness.  Additional pertinent ROS negative except as noted in HPI.    EKGs/Labs/Other Studies Reviewed:    The following studies were reviewed today: Echo 10/26/18 1. The left ventricle has mild-moderately reduced systolic function, with an ejection fraction of 40-45%. The cavity size was severely dilated. Left ventricular diastolic Doppler parameters are consistent with pseudonormalization Elevated left ventricular end-diastolic pressure.  2. Poor images preclude accurate assessment of wall motion even with definity.  3. The right ventricle has normal systolic function. The cavity was normal. There is no increase in right ventricular wall thickness.  4. Left atrial size was moderately dilated.  5. The mitral valve is normal in structure. Mitral valve regurgitation is moderate by color flow Doppler.  6. The tricuspid valve is normal in structure.  7. The aortic valve is tricuspid.  8. The pulmonic valve was normal in structure.  9. There is mild dilatation of the ascending aorta measuring 38 mm. 10. Right atrial pressure is estimated at 3 mmHg.  R/LHC 10/26/18  Mid LM to Dist LM lesion is 95% stenosed.  Prox LAD lesion is 100%  stenosed.  Prox Cx lesion is 100% stenosed.  Prox RCA to Dist RCA lesion is 100% stenosed.  LV end diastolic pressure is normal.  Hemodynamic findings consistent with mild pulmonary hypertension.   1. Occlusive native vessel CAD.     -95% distal left main    - 100% proximal LAD after the first septal perforator    - 100% LCx after a very small OM1.    - 100% proximal RCA 2. Patent LIMA to the LAD 3. Patent free radial graft arising from the mid LIMA graft and supplying the first diagonal and OM 4. SVG to RCA is known to be occluded. The distal RCA is supplied by collaterals.  5. Normal LV filling pressures 6. Mild pulmonary HTN. 7. Preserved cardiac output.  Plan: continue medical therapy for CHF. Will switch IV lasix to po. Stop IV Ntg and heparin.   EKG:  EKG is personally reviewed.  The ekg ordered today demonstrates sinus bradycardia at 56 bpm, iLBBB. Inferior/lateral ST changes similar location but more pronounce than prior  Recent Labs: 01/28/2019: Magnesium 1.7 01/30/2019: B Natriuretic Peptide 686.0 02/11/2019: Pro B Natriuretic peptide (BNP) 567.0; TSH 1.74 05/13/2019: ALT 19; Hemoglobin 12.9; Platelets 178.0 07/19/2019: BUN 31; Creatinine, Ser 2.20; Potassium 5.1; Sodium 143  Recent Lipid Panel    Component Value Date/Time   CHOL 198 05/13/2019 1402   TRIG 169.0 (H) 05/13/2019 1402   HDL 43.90 05/13/2019 1402   CHOLHDL 5 05/13/2019 1402   VLDL 33.8 05/13/2019 1402   LDLCALC 120 (H) 05/13/2019 1402   LDLDIRECT 155.0 07/13/2017 1033    Physical Exam:    VS:  BP (!) 120/58   Pulse (!) 55   Ht 6' (1.829 m)   Wt 254 lb (115.2 kg)   SpO2 96%   BMI 34.45 kg/m     Wt Readings from Last 3 Encounters:  08/30/19 254 lb (115.2 kg)  07/19/19 259 lb (117.5 kg)  06/25/19 255 lb 6.4 oz (115.8 kg)   GEN: Well nourished, well developed in no acute distress HEENT: Normal, moist mucous membranes NECK: No JVD CARDIAC: regular rhythm, normal S1 and S2, no rubs or gallops.  No murmur. VASCULAR: Radial and DP pulses 2+ bilaterally. No carotid bruits RESPIRATORY:  Clear to auscultation without rales, wheezing or rhonchi  ABDOMEN: Soft, non-tender, non-distended MUSCULOSKELETAL:  Ambulates independently SKIN: Warm and dry, no edema NEUROLOGIC:  Alert and oriented x 3. No focal neuro deficits noted. PSYCHIATRIC:  Normal affect   ASSESSMENT:    1. Coronary artery disease involving native coronary artery of native heart with angina pectoris ([place])   2. Chronic combined systolic and diastolic heart failure ([place])   3. Hx of CABG   4. Ischemic cardiomyopathy   5. Mixed hyperlipidemia    PLAN:    CAD s/p CABG 2006: known SVG-PDA is occluded, other grafts are patent. No intervenable targets.  We spoke about this at length. He is frustrated that his symptoms are likely to be lifelong. Discussed medical management. Reviewed his cath, lack of targets for intervention.   I wrote out detailed medication instructions for him, printed off today so he can take with him.  -continue aspirin, clopidogrel (DAPT until 10/2019 given NSTEMI). No improvement in breathing with changing from ticagrelor -on metoprolol succinate 50 mg daily -on imdur 120 mg BID, feels better than taking daily -on ranexa 500 mg BID -using amlodipine 5 mg for both BP and antianginal effects -has sublingual nitroglycerin -counseled on life threatening hypotension with isosorbide, nitroglycerin and sildenafil. Would not use sildenafil given this.  Chronic systolic and diastolic heart failure:  -NYHA class II symptoms today.  -difficult echo windows, but known to be 40-45% with grade 2 diastolic dysfunction; due to ischemic cardiomyopathy -continue metoprolol succinate 50 mg daily -was on entresto 49-51 mg BID but had to stop as he cannot afford due to the donut hole. Briefly on valsartan, now going back to entresto. Plans to restart, will take valsartan off his med list. -continue lasix 80 mg as  needed and potassium when he takes lasix. Has CKD stage 4, need to monitor renal function/potassium periodically -educated on heart failure, including daily weights, sodium and fluid guidelines, signs/symptoms to watch for, diet and activity recommendations -on empagliflozin (SGLT2i)  Hyperlipidemia: -LDL goal <70. Last LDL 120. Changed to rosuvastatin 40 mg, on ezetimibe as well. If he cannot get LDL to <70, need to consider PCSK9i. We have discussed these and vascepa in the past. Cost is a major concern.  Type II diabetes, on insulin and metformin, with chronic kidney disease stage 3. Most recent T4S 8.3 -trulicity was too expensive for him -on jardiance now, as above  Follow up: 6 weeks  Medication Adjustments/Labs and Tests Ordered: Current medicines are reviewed at length with the patient today.  Concerns regarding medicines are outlined above.  Orders Placed This Encounter  Procedures  . Basic Metabolic Panel (BMET)  . EKG 12-Lead   Meds ordered this encounter  Medications  . isosorbide mononitrate (IMDUR) 120 MG 24 hr tablet    Sig: Take 1 tablet (120 mg total) by mouth 2 (two) times daily.    Dispense:  180 tablet    Refill:  3    Please wait to fill until patient calls  . DISCONTD: furosemide (LASIX) 80 MG tablet    Sig: Take 1 tablet (80 mg total) by mouth daily as needed (If weight increases 3 lbs in one day or 5 lbs in one week). TAKE ON EMPTY STOMACH    Dispense:  90 tablet    Refill:  3    Patient Instructions  Medication Instructions:  No changes: Aspirin, clopidogrel (plavix), ezetimibe (zetia), rosuvastatin (crestor)  Heart failure medications: Stop valsartan, start entresto. Monitor blood pressure, If your top BP number is below 100 or you  feel dizzy/lightheaded, call me. Continue metoprolol succinate 50 mg daily You are currently taking lasix (furosemide) 80 mg every other day. We will check kidney function today. If needed, may have to restart potassium.  May be able to cut back on lasix with start of jardiance.  Angina medications: Take isosorbide (imdur) 120 mg twice daily. This was updated today Continue amlodipine, ranolazine as ordered  Start taking jardiance. Watch your urine output. May have to cut back on lasix (furosemide) with this)  *If you need a refill on your cardiac medications before your next appointment, please call your pharmacy*  Lab Work: Your physician recommends that you return for lab work today.  If you have labs (blood work) drawn today and your tests are completely normal, you will receive your results only by: Marland Kitchen MyChart Message (if you have MyChart) OR . A paper copy in the mail If you have any lab test that is abnormal or we need to change your treatment, we will call you to review the results.  Testing/Procedures: NONE NEEDED  Follow-Up: At [facility], you and your health needs are our priority.  As part of our continuing mission to provide you with exceptional heart care, we have created designated Provider Care Teams.  These Care Teams include your primary Cardiologist (physician) and Advanced Practice Providers (APPs -  Physician Assistants and Nurse Practitioners) who all work together to provide you with the care you need, when you need it.  Your next appointment:   6 week(s)  The format for your next appointment:   Either In Person or Virtual  Provider:   Buford Dresser, MD         Signed, Buford Dresser, MD PhD 08/30/2019   [place]

## 2019-08-30 NOTE — Patient Instructions (Addendum)
Medication Instructions:  No changes: Aspirin, clopidogrel (plavix), ezetimibe (zetia), rosuvastatin (crestor)  Heart failure medications: Stop valsartan, start entresto. Monitor blood pressure, If your top BP number is below 100 or you feel dizzy/lightheaded, call me. Continue metoprolol succinate 50 mg daily You are currently taking lasix (furosemide) 80 mg every other day. We will check kidney function today. If needed, may have to restart potassium. May be able to cut back on lasix with start of jardiance.  Angina medications: Take isosorbide (imdur) 120 mg twice daily. This was updated today Continue amlodipine, ranolazine as ordered  Start taking jardiance. Watch your urine output. May have to cut back on lasix (furosemide) with this)  *If you need a refill on your cardiac medications before your next appointment, please call your pharmacy*  Lab Work: Your physician recommends that you return for lab work today.  If you have labs (blood work) drawn today and your tests are completely normal, you will receive your results only by: Marland Kitchen MyChart Message (if you have MyChart) OR . A paper copy in the mail If you have any lab test that is abnormal or we need to change your treatment, we will call you to review the results.  Testing/Procedures: NONE NEEDED  Follow-Up: At [facility], you and your health needs are our priority.  As part of our continuing mission to provide you with exceptional heart care, we have created designated Provider Care Teams.  These Care Teams include your primary Cardiologist (physician) and Advanced Practice Providers (APPs -  Physician Assistants and Nurse Practitioners) who all work together to provide you with the care you need, when you need it.  Your next appointment:   6 week(s)  The format for your next appointment:   Either In Person or Virtual  Provider:   Buford Dresser, MD

## 2019-08-31 LAB — BASIC METABOLIC PANEL
BUN/Creatinine Ratio: 19 (ref 10–24)
BUN: 33 mg/dL — ABNORMAL HIGH (ref 8–27)
CO2: 24 mmol/L (ref 20–29)
Calcium: 9.7 mg/dL (ref 8.6–10.2)
Chloride: 107 mmol/L — ABNORMAL HIGH (ref 96–106)
Creatinine, Ser: 1.78 mg/dL — ABNORMAL HIGH (ref 0.76–1.27)
GFR calc Af Amer: 44 mL/min/{1.73_m2} — ABNORMAL LOW (ref >59)
GFR calc non Af Amer: 38 mL/min/{1.73_m2} — ABNORMAL LOW (ref >59)
Glucose: 69 mg/dL (ref 65–99)
Potassium: 5.2 mmol/L (ref 3.5–5.2)
Sodium: 143 mmol/L (ref 134–144)

## 2019-09-02 ENCOUNTER — Other Ambulatory Visit: Payer: Self-pay

## 2019-09-02 DIAGNOSIS — I5042 Chronic combined systolic (congestive) and diastolic (congestive) heart failure: Secondary | ICD-10-CM

## 2019-09-02 MED ORDER — FUROSEMIDE 80 MG PO TABS: 80.0000 mg | ORAL_TABLET | ORAL | 3 refills | Status: DC

## 2019-09-22 ENCOUNTER — Other Ambulatory Visit: Payer: Self-pay | Admitting: Internal Medicine

## 2019-09-22 ENCOUNTER — Other Ambulatory Visit: Payer: Self-pay | Admitting: Cardiology

## 2019-09-22 DIAGNOSIS — I25119 Atherosclerotic heart disease of native coronary artery with unspecified angina pectoris: Secondary | ICD-10-CM

## 2019-09-29 ENCOUNTER — Encounter: Payer: Self-pay | Admitting: Cardiology

## 2019-10-21 ENCOUNTER — Ambulatory Visit: Payer: Medicare Other | Admitting: Cardiology

## 2019-10-25 ENCOUNTER — Other Ambulatory Visit: Payer: Self-pay | Admitting: Cardiology

## 2019-10-28 ENCOUNTER — Other Ambulatory Visit: Payer: Self-pay

## 2019-10-28 ENCOUNTER — Other Ambulatory Visit: Payer: Self-pay | Admitting: Cardiology

## 2019-11-01 ENCOUNTER — Ambulatory Visit: Payer: Medicare Other | Admitting: Cardiology

## 2019-11-05 ENCOUNTER — Telehealth: Payer: Self-pay

## 2019-11-05 NOTE — Telephone Encounter (Signed)
Dr. Harrell Gave - Mr. Russell Trevino with CAD and Hx of CABG then NSTEMI 09/2018 treated medically, needs EGD can he hold plavix for 5 days prior to procedure?  If needed could he hold both ASA and plavix?  Thanks.  If pt has issues when I call him I will discuss with you before I clear, otherwise if stable will clear.

## 2019-11-05 NOTE — Telephone Encounter (Signed)
   Primary Cardiologist: Buford Dresser, MD  Chart reviewed as part of pre-operative protocol coverage. Given past medical history and time since last visit, based on ACC/AHA guidelines, CLEARANCE CHENAULT would be at acceptable risk for the planned procedure without further cardiovascular testing.  He is to see Dr. Harrell Gave 11/20/19.  He can hold Plavix 5 days prior to procedure and resume post.  We would prefer he continue Asprin unless he must stop, but resume ASAP.    I will route this recommendation to the requesting party via Epic fax function and remove from pre-op pool.  Please call with questions.  Cecilie Kicks, NP 11/05/2019, 11:09 AM

## 2019-11-05 NOTE — Telephone Encounter (Signed)
He can hold aspirin and plavix if absolutely necessary, but he has severe disease and continues to have stable angina on max meds. I would hold as little and for as short a time as possible given his disease. Thanks.

## 2019-11-05 NOTE — Telephone Encounter (Signed)
   [name] Pre-operative Risk Assessment    Request for surgical clearance:  1. What type of surgery is being performed? EGD   2. When is this surgery scheduled? 12-16-2019   3. What type of clearance is required (medical clearance vs. Pharmacy clearance to hold med vs. Both)? MEDICATION  4. Are there any medications that need to be held prior to surgery and how long? PLAVIX-5 DAYS   5. Practice name and name of physician performing surgery? [facility] DR Ester Rink   6. What is your office phone number  [phone]    7.   What is your office fax number  [phone]  8.   Anesthesia type (None, local, MAC, general) ? NOT LISTED

## 2019-11-08 LAB — HEMOGLOBIN A1C
Est. Avg. Glucose, WB: 180
Est. Avg. Glucose-calculated: 204
Hemoglobin A1C: 7.9 % — ABNORMAL HIGH (ref 4.0–6.0)

## 2019-11-08 LAB — CBC
Hematocrit: 39 % (ref 38.0–52.0)
Hemoglobin: 12.9 g/dL — ABNORMAL LOW (ref 13.0–17.3)
MCH: 28.2 pg (ref 27.0–34.5)
MCHC: 33.1 g/dL (ref 32.0–36.0)
MCV: 85.2 fL (ref 84.0–100.0)
MPV: 11.2 fL (ref 7.2–13.2)
NRBC Absolute: 0 10*3/uL (ref 0.000–0.012)
NRBC Automated: 0 % (ref 0.0–0.2)
Platelets: 138 10*3/uL — ABNORMAL LOW (ref 140–440)
RBC: 4.58 x10e6/mcL (ref 4.00–5.60)
RDW: 15.8 % (ref 11.0–16.0)
WBC: 6.3 10*3/uL (ref 3.8–10.6)

## 2019-11-08 LAB — COMPREHENSIVE METABOLIC PANEL
ALT: 20 U/L (ref 0–41)
AST: 14 U/L (ref 0–40)
Albumin/Globulin Ratio: 1.7 mmol/L (ref 1.00–2.00)
Albumin: 4.3 g/dL (ref 3.5–5.2)
Alk Phosphatase: 92 U/L (ref 40–130)
Anion Gap: 8 mmol/L (ref 2–17)
BUN: 31 mg/dL — ABNORMAL HIGH (ref 8–23)
CO2: 28 mmol/L (ref 22–29)
Calcium: 9.5 mg/dL (ref 8.8–10.2)
Chloride: 103 mmol/L (ref 98–107)
Creatinine: 1.7 mg/dL — ABNORMAL HIGH (ref 0.7–1.3)
GFR African American: 47 mL/min/{1.73_m2} — ABNORMAL LOW (ref >=90)
GFR Non-African American: 40 mL/min/{1.73_m2} — ABNORMAL LOW (ref >=90)
Globulin: 3 g/dL (ref 1.9–4.4)
Glucose: 224 mg/dL — ABNORMAL HIGH (ref 70–99)
OSMOLALITY CALCULATED: 291 mOsm/kg — ABNORMAL HIGH (ref 270–287)
Potassium: 4.9 mmol/L (ref 3.5–5.3)
Sodium: 139 mmol/L (ref 135–145)
Total Bilirubin: 1.4 mg/dL — ABNORMAL HIGH (ref 0.00–1.20)
Total Protein: 6.9 g/dL (ref 6.4–8.3)

## 2019-11-08 LAB — URINALYSIS W/ RFLX MICROSCOPIC
Bilirubin Urine: NEGATIVE
Glucose, UA: 250 mg/dL — AB
Ketones, Urine: NEGATIVE mg/dL
Leukocyte Esterase, Urine: NEGATIVE
Nitrite, Urine: NEGATIVE
Protein, UA: 100 — AB
Specific Gravity, UA: 1.025 (ref 1.003–1.035)
Urobilinogen, Urine: 0.2 EU/dL
pH, UA: 6 (ref 4.5–8.0)

## 2019-11-08 LAB — MICROSCOPIC URINALYSIS
Amorphous, UA: NONE SEEN /HPF
BACTERIA, URINE: NONE SEEN
MUCUS, URINE: NONE SEEN /LPF

## 2019-11-08 LAB — LIPID PANEL
Chol/HDL Ratio: 2.7 (ref 0.0–4.4)
Cholesterol: 141 mg/dL (ref 100–200)
HDL: 52 mg/dL — ABNORMAL LOW (ref 55–72)
LDL Cholesterol: 67.4 mg/dL (ref 0.0–100.0)
LDL/HDL Ratio: 1.3
Triglycerides: 108 mg/dL (ref 0–149)
VLDL: 21.6 mg/dL (ref 5.0–40.0)

## 2019-11-08 LAB — URIC ACID: Uric Acid: 7.1 mg/dL — ABNORMAL HIGH (ref 3.4–7.0)

## 2019-11-08 LAB — MICROALBUMIN / CREATININE URINE RATIO
Creatinine, Ur: 70.4 mg/dL (ref 39.0–259.0)
Microalb, Ur: 48.3 mg/dL — ABNORMAL HIGH (ref 0.0–20.0)
Microalbumin Creatinine Ratio: 686 mg/g Cr — ABNORMAL HIGH (ref 0–30)

## 2019-11-08 LAB — TSH WITH REFLEX TO FT4: TSH: 3.45 mcIU/mL (ref 0.358–3.740)

## 2019-11-08 LAB — PROSTATE SPECIFIC ANTIGEN, TOTAL: PSA: 4.22 ng/mL — ABNORMAL HIGH (ref 0.000–4.000)

## 2019-11-09 LAB — C-PEPTIDE: C-Peptide: 12.4 ng/mL — ABNORMAL HIGH (ref 1.1–4.4)

## 2019-11-11 ENCOUNTER — Ambulatory Visit: Payer: Medicare Other | Admitting: Internal Medicine

## 2019-11-19 NOTE — Patient Instructions (Addendum)
  Blood work was ordered.     Medications reviewed and updated.  Changes include :   None   Your prescription(s) have been submitted to your pharmacy. Please take as directed and contact our office if you believe you are having problem(s) with the medication(s).     Please followup in 6 months

## 2019-11-19 NOTE — Progress Notes (Signed)
Subjective:    Patient ID: Russell Trevino, male    DOB: 1951-04-01, 69 y.o.   MRN: [id number]  HPI The patient is here for follow up of their chronic medical problems, including diabetes, CAD, combined systolic and diastolic HF, hypertension, hyperlipidemia, diabetes, CKD, GERD, BPH  He is taking all of his medications as prescribed.   He is eating better.  He is on a "diet" - he is eating no sugar and low carbs.  He is working on weight loss and had lost weight.  He overall feels better.   His activity level is still low.  He walks minimally. He gets SOB with exertion.  He is scheduled to have an EGD/colonoscopy in April. He sees cardiology today and the eye doctor tomorrow.   SBP at home primarily in 120-130's.  His sugars have been good - today was 98.  Last night his sugar was 107.  He did have his A1c checked 2 weeks ago in [place] and apparently it was 7.9%.  Medications and allergies reviewed with patient and updated if appropriate.  Patient Active Problem List   Diagnosis Date Noted  . CKD (chronic kidney disease) stage 4, GFR 15-29 ml/min (HCC) 08/04/2019  . Coronary artery disease involving native coronary artery of native heart with angina pectoris ([place]) 03/04/2019  . Sleep difficulties 02/11/2019  . CKD stage 3 due to type 2 diabetes mellitus ([place]) 01/28/2019  . Ischemic cardiomyopathy 11/05/2018  . Chronic combined systolic and diastolic heart failure ([place]) 11/05/2018  . Diabetes mellitus ([place]) 08/26/2017  . GERD (gastroesophageal reflux disease) 07/13/2017  . Cough 08/17/2016  . Thoracic back pain 06/21/2016  . Hx of CABG 05/26/2016  . Prostate cancer ([place]) 05/26/2016  . Essential hypertension, benign 05/26/2016  . Mixed hyperlipidemia 05/26/2016  . Coronary artery disease due to calcified coronary lesion 03/25/2013    Current Outpatient Medications on File Prior to Visit  Medication Sig Dispense Refill  . amLODipine (NORVASC) 5 MG tablet Take 1  tablet (5 mg total) by mouth daily. 90 tablet 3  . aspirin 81 MG tablet Take 81 mg by mouth daily at 12 noon.     . BD VEO INSULIN SYR ULTRAFINE 31G X 15/64" 1 ML MISC use as directed TO INJECT INSULIN  twice a day 10 each 0  . clopidogrel (PLAVIX) 75 MG tablet Take 1 tablet (75 mg total) by mouth daily. 90 tablet 3  . empagliflozin (JARDIANCE) 10 MG TABS tablet Take 10 mg by mouth daily before breakfast. 30 tablet 0  . ENTRESTO 49-51 MG TAKE 1 TABLET BY MOUTH TWICE DAILY 60 tablet 11  . ezetimibe (ZETIA) 10 MG tablet Take 1 tablet (10 mg total) by mouth every evening. 90 tablet 1  . finasteride (PROSCAR) 5 MG tablet TAKE 1 TABLET(5 MG) BY MOUTH DAILY 90 tablet 1  . furosemide (LASIX) 80 MG tablet Take 1 tablet (80 mg total) by mouth every other day. TAKE ON EMPTY STOMACH 90 tablet 3  . insulin NPH-regular Human (HUMULIN 70/30) (70-30) 100 UNIT/ML injection 90 units with breakfast, and 30 units with supper 50 mL 3  . isosorbide mononitrate (IMDUR) 120 MG 24 hr tablet Take 1 tablet (120 mg total) by mouth 2 (two) times daily. 180 tablet 3  . metoprolol succinate (TOPROL-XL) 50 MG 24 hr tablet Take 1 tablet (50 mg total) by mouth daily at 12 noon. Take with or immediately following a meal. 90 tablet 3  . nitroGLYCERIN (NITROSTAT) 0.4 MG  SL tablet DISSOLVE 1 TABLET UNDER THE TONGUE EVERY 5 MINS A S NEEDED FOR CHEST PAIN CALL MD/911 IF TAKE 2 DOSES. MAX 3 DOSES PER DAY 25 tablet 0  . pantoprazole (PROTONIX) 40 MG tablet TAKE 1 TABLET(40 MG) BY MOUTH TWICE DAILY 90 tablet 3  . potassium chloride SA (K-DUR) 20 MEQ tablet Take 1 tablet (20 mEq total) by mouth daily as needed (when you take lasix). 90 tablet 1  . ranolazine (RANEXA) 500 MG 12 hr tablet Take 1 tablet (500 mg total) by mouth 2 (two) times daily. 180 tablet 3  . rosuvastatin (CRESTOR) 40 MG tablet Take 1 tablet (40 mg total) by mouth every evening. 90 tablet 3   No current facility-administered medications on file prior to visit.    Past  Medical History:  Diagnosis Date  . BPH (benign prostatic hypertrophy)   . Cardiomyopathy, ischemic   . Coronary artery disease    at [facility], [place] 2009 (GRAFTS PATENT), cath 05/2018 w/ SVG-PDA 100%>>med rx  . Diabetes mellitus, type 2 ([place])   . Frequency of urination   . GERD (gastroesophageal reflux disease)   . Gross hematuria   . History of CHF (congestive heart failure) 2007  . History of kidney stones   . History of myocardial infarction 2006, 2019   2006>>CABG, 2019 cath w/ med rx  . History of urinary retention   . Hyperlipidemia   . Nocturia   . Renal calculi    BILATERAL PER CT SCAN--  NON-OBSTRUCTIVE  . S/P CABG x 4 2006    Past Surgical History:  Procedure Laterality Date  . CARDIAC CATHETERIZATION  01/17/2008   GRAFTS PATENT / EF 40%,  DR SANTOS ([facility])  . CORONARY ARTERY BYPASS GRAFT  04/2005   LIMA-LAD, L radial-OM-D1 and SVG-PDA  . CYSTO / RIGHT URETERAL STENT PLACEMENT  10-08-1999  . CYSTO/  REMOVAL BLADDER STONES  01-14-2010  . CYSTOSCOPY  10/16/2012   Procedure: CYSTOSCOPY;  Surgeon: Malka So, MD;  Location: [facility];  Service: Urology;  Laterality: N/A;  Cystoscopy with Clot Evacuation and fulguration bladder neck.  Marland Kitchen EXTRACORPOREAL SHOCK WAVE LITHOTRIPSY  10-15-2009   RIGHT  . KNEE ARTHROSCOPY  1978   RIGHT  . LEFT SHOULDER SURGERY   2002  . LUMBAR FUSION  09-10-2008   L4 - L5  . PERCUTANEOUS NEPHROSTOLITHOTOMY  1979  . PROSTATE BIOPSY  10/16/2012   Procedure: PROSTATE BIOPSY;  Surgeon: Malka So, MD;  Location: [facility];  Service: Urology;  Laterality: N/A;  Through ultrasound.  Marland Kitchen RIGHT/LEFT HEART CATH AND CORONARY/GRAFT ANGIOGRAPHY N/A 10/26/2018   Procedure: RIGHT/LEFT HEART CATH AND CORONARY/GRAFT ANGIOGRAPHY;  Surgeon: Martinique, Peter M, MD;  Location: [facility];  Service: Cardiovascular;  Laterality: N/A;  . SHOULDER ARTHROSCOPY W/ SUBACROMIAL DECOMPRESSION AND DISTAL CLAVICLE EXCISION  10-11-2004    AND PARTIAL ROTATOR CUFF REPAIR  . TRANSTHORACIC ECHOCARDIOGRAM  62/13/0865   LV SYSTOLIC FUNCTION MILDLY REDUCED/ EF 48%/ LV FILLING PATTERN  IS IMPAIRED/ HYPOKINESIS IN THE MID AND BASALAR SEPTUM & ANTEROSEPTUM  . TURP VAPORIZATION  01-19-2010   BPH W/ BOO  . URETEROLITHOTOMY  1976    Social History   Socioeconomic History  . Marital status: Married    Spouse name: Not on file  . Number of children: 4  . Years of education: Not on file  . Highest education level: Not on file  Occupational History  . Not on file  Tobacco Use  .  Smoking status: Never Smoker  . Smokeless tobacco: Never Used  Substance and Sexual Activity  . Alcohol use: Yes    Comment: Occ.   . Drug use: No  . Sexual activity: Not on file  Other Topics Concern  . Not on file  Social History Narrative   Lives in [place], [place], has family and friends in [place].   Social Determinants of Health   Financial Resource Strain:   . Difficulty of Paying Living Expenses: Not on file  Food Insecurity:   . Worried About Charity fundraiser in the Last Year: Not on file  . Ran Out of Food in the Last Year: Not on file  Transportation Needs:   . Lack of Transportation (Medical): Not on file  . Lack of Transportation (Non-Medical): Not on file  Physical Activity:   . Days of Exercise per Week: Not on file  . Minutes of Exercise per Session: Not on file  Stress:   . Feeling of Stress : Not on file  Social Connections:   . Frequency of Communication with Friends and Family: Not on file  . Frequency of Social Gatherings with Friends and Family: Not on file  . Attends Religious Services: Not on file  . Active Member of Clubs or Organizations: Not on file  . Attends Archivist Meetings: Not on file  . Marital Status: Not on file    Family History  Problem Relation Age of Onset  . Diabetes Father   . Stomach cancer Neg Hx   . Colon cancer Neg Hx   . Rectal cancer Neg Hx   . Esophageal cancer Neg Hx   .  Pancreatic cancer Neg Hx     Review of Systems  Constitutional: Negative for chills and fever.  Respiratory: Positive for shortness of breath (with exertion). Negative for cough and wheezing.   Cardiovascular: Positive for chest pain (occ, much better). Negative for palpitations and leg swelling.  Neurological: Positive for light-headedness (bending over). Negative for headaches.       Objective:   Vitals:   11/20/19 0921  BP: 136/70  Pulse: 60  Resp: 18  Temp: 98.4 F (36.9 C)  SpO2: 98%   BP Readings from Last 3 Encounters:  11/20/19 136/70  08/30/19 (!) 120/58  07/19/19 132/63   Wt Readings from Last 3 Encounters:  11/20/19 248 lb (112.5 kg)  08/30/19 254 lb (115.2 kg)  07/19/19 259 lb (117.5 kg)   Body mass index is 33.63 kg/m.   Physical Exam    Constitutional: Appears well-developed and well-nourished. No distress.  HENT:  Head: Normocephalic and atraumatic.  Neck: Neck supple. No tracheal deviation present. No thyromegaly present.  No cervical lymphadenopathy Cardiovascular: Normal rate, regular rhythm and normal heart sounds.   No murmur heard. No carotid bruit .  No edema Pulmonary/Chest: Effort normal and breath sounds normal. No respiratory distress. No has no wheezes. No rales.  Skin: Skin is warm and dry. Not diaphoretic.  Psychiatric: Normal mood and affect. Behavior is normal.      Assessment & Plan:    See Problem List for Assessment and Plan of chronic medical problems.    This visit occurred during the SARS-CoV-2 public health emergency.  Safety protocols were in place, including screening questions prior to the visit, additional usage of staff PPE, and extensive cleaning of exam room while observing appropriate contact time as indicated for disinfecting solutions.

## 2019-11-20 ENCOUNTER — Ambulatory Visit (INDEPENDENT_AMBULATORY_CARE_PROVIDER_SITE_OTHER): Payer: Medicare Other | Admitting: Internal Medicine

## 2019-11-20 ENCOUNTER — Ambulatory Visit: Payer: Medicare Other | Admitting: Cardiology

## 2019-11-20 ENCOUNTER — Encounter: Payer: Self-pay | Admitting: Cardiology

## 2019-11-20 ENCOUNTER — Encounter: Payer: Self-pay | Admitting: Internal Medicine

## 2019-11-20 ENCOUNTER — Other Ambulatory Visit: Payer: Self-pay

## 2019-11-20 VITALS — BP 116/60 | HR 75 | Temp 97.3°F | Resp 20 | Ht 72.0 in | Wt 248.4 lb

## 2019-11-20 VITALS — BP 136/70 | HR 60 | Temp 98.4°F | Resp 18 | Ht 72.0 in | Wt 248.0 lb

## 2019-11-20 DIAGNOSIS — Z951 Presence of aortocoronary bypass graft: Secondary | ICD-10-CM

## 2019-11-20 DIAGNOSIS — Z794 Long term (current) use of insulin: Secondary | ICD-10-CM | POA: Diagnosis not present

## 2019-11-20 DIAGNOSIS — E1121 Type 2 diabetes mellitus with diabetic nephropathy: Secondary | ICD-10-CM

## 2019-11-20 DIAGNOSIS — I10 Essential (primary) hypertension: Secondary | ICD-10-CM

## 2019-11-20 DIAGNOSIS — I255 Ischemic cardiomyopathy: Secondary | ICD-10-CM

## 2019-11-20 DIAGNOSIS — I5042 Chronic combined systolic (congestive) and diastolic (congestive) heart failure: Secondary | ICD-10-CM

## 2019-11-20 DIAGNOSIS — N183 Chronic kidney disease, stage 3 unspecified: Secondary | ICD-10-CM | POA: Diagnosis not present

## 2019-11-20 DIAGNOSIS — K219 Gastro-esophageal reflux disease without esophagitis: Secondary | ICD-10-CM | POA: Diagnosis not present

## 2019-11-20 DIAGNOSIS — N400 Enlarged prostate without lower urinary tract symptoms: Secondary | ICD-10-CM

## 2019-11-20 DIAGNOSIS — E782 Mixed hyperlipidemia: Secondary | ICD-10-CM

## 2019-11-20 DIAGNOSIS — N1832 Chronic kidney disease, stage 3b: Secondary | ICD-10-CM

## 2019-11-20 DIAGNOSIS — E1159 Type 2 diabetes mellitus with other circulatory complications: Secondary | ICD-10-CM

## 2019-11-20 DIAGNOSIS — Z7189 Other specified counseling: Secondary | ICD-10-CM

## 2019-11-20 DIAGNOSIS — I25119 Atherosclerotic heart disease of native coronary artery with unspecified angina pectoris: Secondary | ICD-10-CM

## 2019-11-20 DIAGNOSIS — E1122 Type 2 diabetes mellitus with diabetic chronic kidney disease: Secondary | ICD-10-CM

## 2019-11-20 LAB — COMPREHENSIVE METABOLIC PANEL
ALT: 16 U/L (ref 0–53)
AST: 19 U/L (ref 0–37)
Albumin: 3.8 g/dL (ref 3.5–5.2)
Alkaline Phosphatase: 65 U/L (ref 39–117)
BUN: 34 mg/dL — ABNORMAL HIGH (ref 6–23)
CO2: 27 mEq/L (ref 19–32)
Calcium: 9.4 mg/dL (ref 8.4–10.5)
Chloride: 99 mEq/L (ref 96–112)
Creatinine, Ser: 2.25 mg/dL — ABNORMAL HIGH (ref 0.40–1.50)
GFR: 29.03 mL/min — ABNORMAL LOW (ref >60.00)
Glucose, Bld: 234 mg/dL — ABNORMAL HIGH (ref 70–99)
Potassium: 3.9 mEq/L (ref 3.5–5.1)
Sodium: 133 mEq/L — ABNORMAL LOW (ref 135–145)
Total Bilirubin: 1.4 mg/dL — ABNORMAL HIGH (ref 0.2–1.2)
Total Protein: 7 g/dL (ref 6.0–8.3)

## 2019-11-20 LAB — CBC WITH DIFFERENTIAL/PLATELET
Basophils Absolute: 0 10*3/uL (ref 0.0–0.1)
Basophils Relative: 0.6 % (ref 0.0–3.0)
Eosinophils Absolute: 0.2 10*3/uL (ref 0.0–0.7)
Eosinophils Relative: 2.8 % (ref 0.0–5.0)
HCT: 38.8 % — ABNORMAL LOW (ref 39.0–52.0)
Hemoglobin: 13.2 g/dL (ref 13.0–17.0)
Lymphocytes Relative: 16.7 % (ref 12.0–46.0)
Lymphs Abs: 1.4 10*3/uL (ref 0.7–4.0)
MCHC: 34.1 g/dL (ref 30.0–36.0)
MCV: 86 fl (ref 78.0–100.0)
Monocytes Absolute: 0.8 10*3/uL (ref 0.1–1.0)
Monocytes Relative: 9.3 % (ref 3.0–12.0)
Neutro Abs: 5.7 10*3/uL (ref 1.4–7.7)
Neutrophils Relative %: 70.6 % (ref 43.0–77.0)
Platelets: 146 10*3/uL — ABNORMAL LOW (ref 150.0–400.0)
RBC: 4.52 Mil/uL (ref 4.22–5.81)
RDW: 16.7 % — ABNORMAL HIGH (ref 11.5–15.5)
WBC: 8.1 10*3/uL (ref 4.0–10.5)

## 2019-11-20 LAB — LIPID PANEL
Cholesterol: 142 mg/dL (ref 0–200)
HDL: 46 mg/dL (ref >39.00)
LDL Cholesterol: 58 mg/dL (ref 0–99)
NonHDL: 96.44
Total CHOL/HDL Ratio: 3
Triglycerides: 193 mg/dL — ABNORMAL HIGH (ref 0.0–149.0)
VLDL: 38.6 mg/dL (ref 0.0–40.0)

## 2019-11-20 LAB — HEMOGLOBIN A1C: Hgb A1c MFr Bld: 7.8 % — ABNORMAL HIGH (ref 4.6–6.5)

## 2019-11-20 MED ORDER — JARDIANCE 10 MG PO TABS: 10.0000 mg | ORAL_TABLET | Freq: Every day | ORAL | 5 refills | Status: DC

## 2019-11-20 NOTE — Assessment & Plan Note (Signed)
Chronic Blood pressure higher here than it has been at home Overall well controlled Following with cardiology and sees them today Continue current medication-cardiology will adjust if they see it is necessary We will check CMP

## 2019-11-20 NOTE — Assessment & Plan Note (Signed)
CMP today He is working on weight loss and has been eating well Continue increased fluids

## 2019-11-20 NOTE — Assessment & Plan Note (Signed)
Chronic Continue Proscar daily

## 2019-11-20 NOTE — Assessment & Plan Note (Signed)
Chronic Sugars recently well controlled at home He has been doing very well with his diet and has lost weight Will try to increase his activity, but he is limited by his shortness of breath with exertion Continue insulin at current dose.  Discussed that he may need to start decreasing the insulin he is giving himself based on his home readings.  Advised that his sugar does not need to be below 100 Continue Jardiance at 10 mg daily given his GFR He will continue with his weight loss efforts Follow-up in 6 months

## 2019-11-20 NOTE — Assessment & Plan Note (Signed)
Chronic Taking Crestor daily and Zetia daily Seeing cardiology later today CMP, lipids

## 2019-11-20 NOTE — Assessment & Plan Note (Signed)
Continue daily PPI Has EGD/colonoscopy scheduled for next month

## 2019-11-20 NOTE — Progress Notes (Signed)
Cardiology Office Note:    Date:  11/20/2019   ID:  Russell Trevino, DOB 08/24/1951, MRN [id number]  PCP:  Binnie Rail, MD  Cardiologist:  Buford Dresser, MD PhD  Referring MD: Binnie Rail, MD   CC: follow up   History of Present Illness:    Russell Trevino is a 69 y.o. male with a hx of CAD s/p prior CABG, NSTEMI 10/2018, ischemic cardiomyopathy with EF 40-45% who is seen for follow up.   Cardiac history: Hospitalized 10/2018 for NSTEMI and heart failure symptoms. Severe native CAD, no intervenable targets on cath. Recommended for medication management. Prior history notable for NSTEMI in 2006 that lead to CABG, with LIMA-LAD, L Radial-OM-D1, SVG-PDA. Noted to have ischemic cardiomyopathy with EF 40-45%. He was readmitted and discharged with heart failure after running out of furosemide for two weeks.    Today: Best he has felt in some time. Started no sugar/no carb diet a week ago, feels well. BP and sugar well controlled.  We discussed clopidogrel today. Discussed pros and cons of stopping, as he is now 12 months past his NSTEMI. Reviewed his cath data, severe native disease, vein graft down, but LIMA and radial grafts were open and supplied collaterals. He bruises easily but has had no major bleeding.  Hasn't been walking much, we discussed today. Used to walk to [employer], about 1/2 mile roundtrip, but hasn't done in some time. Encouraged him to increase his activity now that his angina control is much improved.  Past Medical History:  Diagnosis Date  . BPH (benign prostatic hypertrophy)   . Cardiomyopathy, ischemic   . Coronary artery disease    at [facility], [place] 2009 (GRAFTS PATENT), cath 05/2018 w/ SVG-PDA 100%>>med rx  . Diabetes mellitus, type 2 ([place])   . Frequency of urination   . GERD (gastroesophageal reflux disease)   . Gross hematuria   . History of CHF (congestive heart failure) 2007  . History of kidney stones   . History of myocardial infarction 2006,  2019   2006>>CABG, 2019 cath w/ med rx  . History of urinary retention   . Hyperlipidemia   . Nocturia   . Renal calculi    BILATERAL PER CT SCAN--  NON-OBSTRUCTIVE  . S/P CABG x 4 2006    Past Surgical History:  Procedure Laterality Date  . CARDIAC CATHETERIZATION  01/17/2008   GRAFTS PATENT / EF 40%,  DR SANTOS ([facility])  . CORONARY ARTERY BYPASS GRAFT  04/2005   LIMA-LAD, L radial-OM-D1 and SVG-PDA  . CYSTO / RIGHT URETERAL STENT PLACEMENT  10-08-1999  . CYSTO/  REMOVAL BLADDER STONES  01-14-2010  . CYSTOSCOPY  10/16/2012   Procedure: CYSTOSCOPY;  Surgeon: Malka So, MD;  Location: [facility];  Service: Urology;  Laterality: N/A;  Cystoscopy with Clot Evacuation and fulguration bladder neck.  Marland Kitchen EXTRACORPOREAL SHOCK WAVE LITHOTRIPSY  10-15-2009   RIGHT  . KNEE ARTHROSCOPY  1978   RIGHT  . LEFT SHOULDER SURGERY   2002  . LUMBAR FUSION  09-10-2008   L4 - L5  . PERCUTANEOUS NEPHROSTOLITHOTOMY  1979  . PROSTATE BIOPSY  10/16/2012   Procedure: PROSTATE BIOPSY;  Surgeon: Malka So, MD;  Location: [facility];  Service: Urology;  Laterality: N/A;  Through ultrasound.  Marland Kitchen RIGHT/LEFT HEART CATH AND CORONARY/GRAFT ANGIOGRAPHY N/A 10/26/2018   Procedure: RIGHT/LEFT HEART CATH AND CORONARY/GRAFT ANGIOGRAPHY;  Surgeon: Martinique, Peter M, MD;  Location: [facility];  Service:  Cardiovascular;  Laterality: N/A;  . SHOULDER ARTHROSCOPY W/ SUBACROMIAL DECOMPRESSION AND DISTAL CLAVICLE EXCISION  10-11-2004   AND PARTIAL ROTATOR CUFF REPAIR  . TRANSTHORACIC ECHOCARDIOGRAM  09/20/3233   LV SYSTOLIC FUNCTION MILDLY REDUCED/ EF 48%/ LV FILLING PATTERN  IS IMPAIRED/ HYPOKINESIS IN THE MID AND BASALAR SEPTUM & ANTEROSEPTUM  . TURP VAPORIZATION  01-19-2010   BPH W/ BOO  . URETEROLITHOTOMY  1976    Current Medications: Current Outpatient Medications on File Prior to Visit  Medication Sig  . amLODipine (NORVASC) 5 MG tablet Take 1 tablet (5 mg total) by mouth  daily.  Marland Kitchen aspirin 81 MG tablet Take 81 mg by mouth daily at 12 noon.   . BD VEO INSULIN SYR ULTRAFINE 31G X 15/64" 1 ML MISC use as directed TO INJECT INSULIN  twice a day  . clopidogrel (PLAVIX) 75 MG tablet Take 1 tablet (75 mg total) by mouth daily.  Marland Kitchen ENTRESTO 49-51 MG TAKE 1 TABLET BY MOUTH TWICE DAILY  . ezetimibe (ZETIA) 10 MG tablet Take 1 tablet (10 mg total) by mouth every evening.  . finasteride (PROSCAR) 5 MG tablet TAKE 1 TABLET(5 MG) BY MOUTH DAILY  . furosemide (LASIX) 80 MG tablet Take 1 tablet (80 mg total) by mouth every other day. TAKE ON EMPTY STOMACH  . insulin NPH-regular Human (HUMULIN 70/30) (70-30) 100 UNIT/ML injection 90 units with breakfast, and 30 units with supper  . isosorbide mononitrate (IMDUR) 120 MG 24 hr tablet Take 1 tablet (120 mg total) by mouth 2 (two) times daily.  . metoprolol succinate (TOPROL-XL) 50 MG 24 hr tablet Take 1 tablet (50 mg total) by mouth daily at 12 noon. Take with or immediately following a meal.  . nitroGLYCERIN (NITROSTAT) 0.4 MG SL tablet DISSOLVE 1 TABLET UNDER THE TONGUE EVERY 5 MINS A S NEEDED FOR CHEST PAIN CALL MD/911 IF TAKE 2 DOSES. MAX 3 DOSES PER DAY  . omeprazole (PRILOSEC) 40 MG capsule Take capsule by mouth every morning  . pantoprazole (PROTONIX) 40 MG tablet TAKE 1 TABLET(40 MG) BY MOUTH TWICE DAILY  . PEPCID 40 MG tablet Take 40 mg by mouth at bedtime.  . potassium chloride SA (K-DUR) 20 MEQ tablet Take 1 tablet (20 mEq total) by mouth daily as needed (when you take lasix).  . ranolazine (RANEXA) 500 MG 12 hr tablet Take 1 tablet (500 mg total) by mouth 2 (two) times daily.  . rosuvastatin (CRESTOR) 40 MG tablet Take 1 tablet (40 mg total) by mouth every evening.   No current facility-administered medications on file prior to visit.     Allergies:   Patient has no known allergies.   Social History   Tobacco Use  . Smoking status: Never Smoker  . Smokeless tobacco: Never Used  Substance Use Topics  . Alcohol  use: Yes    Comment: Occ.   . Drug use: No    Family History: The patient's family history includes Diabetes in his father. There is no history of Stomach cancer, Colon cancer, Rectal cancer, Esophageal cancer, or Pancreatic cancer.  ROS:   Please see the history of present illness.  Additional pertinent ROS negative except as noted in HPI.    EKGs/Labs/Other Studies Reviewed:    The following studies were reviewed today: Echo 10/26/18 1. The left ventricle has mild-moderately reduced systolic function, with an ejection fraction of 40-45%. The cavity size was severely dilated. Left ventricular diastolic Doppler parameters are consistent with pseudonormalization Elevated left ventricular end-diastolic pressure.  2.  Poor images preclude accurate assessment of wall motion even with definity.  3. The right ventricle has normal systolic function. The cavity was normal. There is no increase in right ventricular wall thickness.  4. Left atrial size was moderately dilated.  5. The mitral valve is normal in structure. Mitral valve regurgitation is moderate by color flow Doppler.  6. The tricuspid valve is normal in structure.  7. The aortic valve is tricuspid.  8. The pulmonic valve was normal in structure.  9. There is mild dilatation of the ascending aorta measuring 38 mm. 10. Right atrial pressure is estimated at 3 mmHg.  R/LHC 10/26/18  Mid LM to Dist LM lesion is 95% stenosed.  Prox LAD lesion is 100% stenosed.  Prox Cx lesion is 100% stenosed.  Prox RCA to Dist RCA lesion is 100% stenosed.  LV end diastolic pressure is normal.  Hemodynamic findings consistent with mild pulmonary hypertension.   1. Occlusive native vessel CAD.     -95% distal left main    - 100% proximal LAD after the first septal perforator    - 100% LCx after a very small OM1.    - 100% proximal RCA 2. Patent LIMA to the LAD 3. Patent free radial graft arising from the mid LIMA graft and supplying the first  diagonal and OM 4. SVG to RCA is known to be occluded. The distal RCA is supplied by collaterals.  5. Normal LV filling pressures 6. Mild pulmonary HTN. 7. Preserved cardiac output.  Plan: continue medical therapy for CHF. Will switch IV lasix to po. Stop IV Ntg and heparin.   EKG:  EKG is personally reviewed.  The ekg ordered 08/30/19 demonstrates sinus bradycardia at 56 bpm, iLBBB. Inferior/lateral ST changes similar location but more pronounce than prior  Recent Labs: 01/28/2019: Magnesium 1.7 01/30/2019: B Natriuretic Peptide 686.0 02/11/2019: Pro B Natriuretic peptide (BNP) 567.0; TSH 1.74 05/13/2019: ALT 19; Hemoglobin 12.9; Platelets 178.0 08/30/2019: BUN 33; Creatinine, Ser 1.78; Potassium 5.2; Sodium 143  Recent Lipid Panel    Component Value Date/Time   CHOL 198 05/13/2019 1402   TRIG 169.0 (H) 05/13/2019 1402   HDL 43.90 05/13/2019 1402   CHOLHDL 5 05/13/2019 1402   VLDL 33.8 05/13/2019 1402   LDLCALC 120 (H) 05/13/2019 1402   LDLDIRECT 155.0 07/13/2017 1033    Physical Exam:    VS:  BP 116/60   Pulse 75   Temp (!) 97.3 F (36.3 C)   Resp 20   Ht 6' (1.829 m)   Wt 248 lb 6.4 oz (112.7 kg)   SpO2 97%   BMI 33.69 kg/m     Wt Readings from Last 3 Encounters:  11/20/19 248 lb 6.4 oz (112.7 kg)  11/20/19 248 lb (112.5 kg)  08/30/19 254 lb (115.2 kg)   GEN: Well nourished, well developed in no acute distress HEENT: Normal, moist mucous membranes NECK: No JVD CARDIAC: regular rhythm, normal S1 and S2, no rubs or gallops. No murmur. VASCULAR: Radial and DP pulses 2+ bilaterally. No carotid bruits RESPIRATORY:  Clear to auscultation without rales, wheezing or rhonchi  ABDOMEN: Soft, non-tender, non-distended MUSCULOSKELETAL:  Ambulates independently SKIN: Warm and dry, no edema NEUROLOGIC:  Alert and oriented x 3. No focal neuro deficits noted. PSYCHIATRIC:  Normal affect   ASSESSMENT:    1. Coronary artery disease involving native coronary artery of native  heart with angina pectoris ([place])   2. Hx of CABG   3. Ischemic cardiomyopathy   4. Chronic combined  systolic and diastolic heart failure ([place])   5. Mixed hyperlipidemia   6. Essential hypertension, benign   7. Type 2 diabetes mellitus with other circulatory complication, with long-term current use of insulin ([place])   8. Counseling on health promotion and disease prevention    PLAN:    CAD s/p CABG 2006: known SVG-PDA is occluded, other grafts are patent. No intervenable targets. Severe native disease -we discussed DAPT today. Discussed available data and guidelines. Discussed pros/cons of continuing and stopping clopidogrel. After shared decision making, we will continue DAPT indefinitely. He has an upcoming EGD. Ok to hold clopidogrel for procedure. If no significant gastritis/ulcers seen, would restart clopidogrel. We will continue to discuss -on metoprolol succinate 50 mg daily -on imdur 120 mg BID, feels better than taking daily -on ranexa 500 mg BID -using amlodipine 5 mg for both BP and antianginal effects -has sublingual nitroglycerin -counseled on life threatening hypotension with isosorbide, nitroglycerin and sildenafil/erectile dysfunction meds in this class. Would not use PDE5i given this.  Chronic systolic and diastolic heart failure:  -NYHA class II symptoms today.  -difficult echo windows, but known to be 40-45% with grade 2 diastolic dysfunction; due to ischemic cardiomyopathy -continue metoprolol succinate 50 mg daily -tolerating entresto 49-51 mg BID -continue lasix 80 mg as needed (about every other day) and potassium when he takes lasix. Has CKD stage 4, need to monitor renal function/potassium periodically. Labs pending today -educated on heart failure, including daily weights, sodium and fluid guidelines, signs/symptoms to watch for, diet and activity recommendations -on empagliflozin (SGLT2i)  Hypertension: at goal of <130/80, continue medications as  listed  Hyperlipidemia: -LDL goal <70. Last LDL 120. Changed to rosuvastatin 40 mg, on ezetimibe as well. If he cannot get LDL to <70, need to consider PCSK9i. We have discussed these and vascepa in the past. Cost is a major concern. -lipid labs pending today  Type II diabetes, on insulin and metformin, with chronic kidney disease stage 3. Most recent V7O 8.3 -trulicity was too expensive for him -on jardiance now, as above -A1c pending, as above  Secondary prevention lifestyle recommendations: -recommend heart healthy/Mediterranean diet, with whole grains, fruits, vegetable, fish, lean meats, nuts, and olive oil. Limit salt. -recommend moderate walking, 3-5 times/week for 30-50 minutes each session. Aim for at least 150 minutes.week. Goal should be pace of 3 miles/hours, or walking 1.5 miles in 30 minutes -recommend avoidance of tobacco products. Avoid excess alcohol.  Follow up: 4 months or sooner as needed  Medication Adjustments/Labs and Tests Ordered: Current medicines are reviewed at length with the patient today.  Concerns regarding medicines are outlined above.  No orders of the defined types were placed in this encounter.  No orders of the defined types were placed in this encounter.   Patient Instructions  Medication Instructions:  Your Physician recommend you continue on your current medication as directed.    *If you need a refill on your cardiac medications before your next appointment, please call your pharmacy*   Lab Work: None   Testing/Procedures: None   Follow-Up: At [facility], you and your health needs are our priority.  As part of our continuing mission to provide you with exceptional heart care, we have created designated Provider Care Teams.  These Care Teams include your primary Cardiologist (physician) and Advanced Practice Providers (APPs -  Physician Assistants and Nurse Practitioners) who all work together to provide you with the care you need,  when you need it.  We recommend signing up  for the patient portal called "MyChart".  Sign up information is provided on this After Visit Summary.  MyChart is used to connect with patients for Virtual Visits (Telemedicine).  Patients are able to view lab/test results, encounter notes, upcoming appointments, etc.  Non-urgent messages can be sent to your provider as well.   To learn more about what you can do with MyChart, go to [email or web address].    Your next appointment:   4 month(s)  The format for your next appointment:   In Person  Provider:   Buford Dresser, MD         Signed, Buford Dresser, MD PhD 11/20/2019   [place]

## 2019-11-20 NOTE — Patient Instructions (Signed)
Medication Instructions:  Your Physician recommend you continue on your current medication as directed.    *If you need a refill on your cardiac medications before your next appointment, please call your pharmacy*   Lab Work: None   Testing/Procedures: None   Follow-Up: At [facility], you and your health needs are our priority.  As part of our continuing mission to provide you with exceptional heart care, we have created designated Provider Care Teams.  These Care Teams include your primary Cardiologist (physician) and Advanced Practice Providers (APPs -  Physician Assistants and Nurse Practitioners) who all work together to provide you with the care you need, when you need it.  We recommend signing up for the patient portal called "MyChart".  Sign up information is provided on this After Visit Summary.  MyChart is used to connect with patients for Virtual Visits (Telemedicine).  Patients are able to view lab/test results, encounter notes, upcoming appointments, etc.  Non-urgent messages can be sent to your provider as well.   To learn more about what you can do with MyChart, go to [email or web address].    Your next appointment:   4 month(s)  The format for your next appointment:   In Person  Provider:   Bridgette Christopher, MD    

## 2019-11-21 LAB — HM DIABETES EYE EXAM

## 2019-12-01 ENCOUNTER — Encounter: Payer: Self-pay | Admitting: Internal Medicine

## 2019-12-13 NOTE — Nursing Note (Signed)
 Adult Admission Assessment - Text       Perioperative Admission Assessment Entered On:  12/13/2019 9:51 EDT    Performed On:  12/13/2019 9:32 EDT by Devora, RN, Lauraine BRAVO               General   Call Start :   12/13/2019 9:32 EDT   Call Complete :   12/13/2019 9:55 EDT   Information Given By :   Self   Height/Length Estimated :   183 cm(Converted to: 6 ft 0 in, 6.00 ft, 72.05 in)    BMI   Estimated :   109 kg(Converted to: 240 lb 5 oz, 240.304 lb)    Body Mass Index Estimated :   32.55 kg/m2   Primary Care Physician/Specialists :   Dr. Oneil Sabin - PCP  Dr. Loni - cardio   Emergency Contact Name :   Dorothe Bohr - sister   Emergency Contact Phone :   [phone]   Languages :   Isadora   Preferred Communication Mode :   Verbal   Devora, RN, Lauraine BRAVO - 12/13/2019 9:32 EDT   Allergies   (As Of: 12/16/2019 13:18:26 EDT)   Allergies (Active)   No Known Allergies  Estimated Onset Date:   Unspecified ; Created By:   Koleen Sora; Reaction Status:   Active ; Category:   Drug ; Substance:   No Known Allergies ; Type:   Allergy ; Updated By:   Koleen Sora; Reviewed Date:   12/16/2019 13:15 EDT        Medication History   Medication List   (As Of: 12/16/2019 13:18:27 EDT)   Normal Order    Lactated Ringers Injection solution 1,000 mL  :   Lactated Ringers Injection solution 1,000 mL ; Status:   Ordered ; Ordered As Mnemonic:   Lactated Ringers Injection 1,000 mL ; Simple Display Line:   30 mL/hr, IV ; Ordering Provider:   SUAREZ,  ALEJANDRO L-MD; Catalog Code:   Lactated Ringers Injection ; Order Dt/Tm:   12/16/2019 13:07:36 EDT ; Comment:   Perioperative use ONLY  For Non Dialysis Patient            Home Meds    insulin isophane-insulin regular  :   insulin isophane-insulin regular ; Status:   Documented ; Ordered As Mnemonic:   NovoLIN 70/30 subcutaneous suspension ; Simple Display Line:   70 units, Subcutaneous, Once, 10 mL, 0 Refill(s) ; Catalog Code:   insulin isophane-insulin regular ; Order Dt/Tm:   12/13/2019 09:44:48 EDT ;  Comment:   SOUND ALIKE / LOOK ALIKE - VERIFY DRUG          aspirin  :   aspirin ; Status:   Documented ; Ordered As Mnemonic:   Aspirin Low Dose 81 mg oral delayed release tablet ; Simple Display Line:   81 mg, 1 tabs, Oral, Daily, 30 tabs, 0 Refill(s) ; Catalog Code:   aspirin ; Order Dt/Tm:   12/13/2019 09:40:59 EDT          metoprolol   :   metoprolol  ; Status:   Documented ; Ordered As Mnemonic:   Metoprolol  Succinate ER 50 mg oral tablet, extended release ; Simple Display Line:   50 mg, 1 tabs, Oral, Daily, 30 tabs, 0 Refill(s) ; Catalog Code:   metoprolol  ; Order Dt/Tm:   12/13/2019 09:43:07 EDT          ezetimibe   :   ezetimibe  ; Status:  Documented ; Ordered As Mnemonic:   ezetimibe  10 mg oral tablet ; Simple Display Line:   10 mg, 1 tabs, Oral, Daily, 30 tabs, 0 Refill(s) ; Catalog Code:   ezetimibe  ; Order Dt/Tm:   12/13/2019 09:43:07 EDT          famotidine   :   famotidine  ; Status:   Documented ; Ordered As Mnemonic:   famotidine  40 mg oral tablet ; Simple Display Line:   40 mg, 1 tabs, Oral, Once a Day (at bedtime), 30 tabs, 0 Refill(s) ; Catalog Code:   famotidine  ; Order Dt/Tm:   12/13/2019 09:43:07 EDT          finasteride   :   finasteride  ; Status:   Documented ; Ordered As Mnemonic:   finasteride  5 mg oral tablet ; Simple Display Line:   5 mg, 1 tabs, Oral, Daily, 30 tabs, 0 Refill(s) ; Catalog Code:   finasteride  ; Order Dt/Tm:   12/13/2019 09:43:07 EDT ; Comment:    CRUSHED TABLETS ARE NOT TO   BE HANDLED BY PREGNANT WOMEN OR BY   WOMEN WHO MAY BECOME PREGNANT.          isosorbide  mononitrate  :   isosorbide  mononitrate ; Status:   Documented ; Ordered As Mnemonic:   isosorbide  mononitrate 120 mg oral tablet, extended release ; Simple Display Line:   120 mg, 1 tabs, Oral, qAM, 30 tabs, 0 Refill(s) ; Catalog Code:   isosorbide  mononitrate ; Order Dt/Tm:   12/13/2019 09:43:07 EDT          nitroglycerin   :   nitroglycerin  ; Status:   Documented ; Ordered As Mnemonic:   nitroglycerin  0.4 mg sublingual tablet ;  Simple Display Line:   0.4 mg, 1 tabs, SL, q5min, PRN: for chest pain, 0 Refill(s) ; Catalog Code:   nitroglycerin  ; Order Dt/Tm:   12/13/2019 09:43:07 EDT          omeprazole  :   omeprazole ; Status:   Documented ; Ordered As Mnemonic:   omeprazole 40 mg oral delayed release capsule ; Simple Display Line:   40 mg, 1 caps, Oral, Daily, 0 Refill(s) ; Catalog Code:   omeprazole ; Order Dt/Tm:   12/13/2019 09:43:07 EDT          ranolazine   :   ranolazine  ; Status:   Documented ; Ordered As Mnemonic:   ranolazine  500 mg oral tablet, extended release ; Simple Display Line:   500 mg, 1 tabs, Oral, BID, 60 tabs, 0 Refill(s) ; Catalog Code:   ranolazine  ; Order Dt/Tm:   12/13/2019 09:43:07 EDT          amLODIPine   :   amLODIPine  ; Status:   Documented ; Ordered As Mnemonic:   amLODIPine  5 mg oral tablet ; Simple Display Line:   5 mg, 1 tabs, Oral, Daily, 0 Refill(s) ; Catalog Code:   amLODIPine  ; Order Dt/Tm:   12/13/2019 09:43:07 EDT          clopidogrel   :   clopidogrel  ; Status:   Documented ; Ordered As Mnemonic:   clopidogrel  75 mg oral tablet ; Simple Display Line:   75 mg, 1 tabs, Oral, Daily, 0 Refill(s) ; Catalog Code:   clopidogrel  ; Order Dt/Tm:   12/13/2019 09:43:07 EDT ; Comment:   IF PATIENT ON OMEPRAZOLE, PANTOPRAZOLE    SHOULD BE AUTOSUBSTITUTED DUE TO A   DRUG INTERACTION WITH CLOPIDOGREL .@@  empagliflozin  :   empagliflozin ; Status:   Documented ; Ordered As Mnemonic:   Jardiance 10 mg oral tablet ; Simple Display Line:   0 Refill(s) ; Catalog Code:   empagliflozin ; Order Dt/Tm:   12/13/2019 09:43:07 EDT          rosuvastatin   :   rosuvastatin  ; Status:   Documented ; Ordered As Mnemonic:   rosuvastatin  40 mg oral tablet ; Simple Display Line:   40 mg, 1 tabs, Oral, Daily, 0 Refill(s) ; Catalog Code:   rosuvastatin  ; Order Dt/Tm:   12/13/2019 09:43:07 EDT          sacubitril-valsartan  :   sacubitril-valsartan ; Status:   Documented ; Ordered As Mnemonic:   Entresto  49 mg-51 mg oral tablet ; Simple Display  Line:   0 Refill(s) ; Catalog Code:   sacubitril-valsartan ; Order Dt/Tm:   12/13/2019 09:43:07 EDT            Problem History   (As Of: 12/16/2019 13:18:27 EDT)   Problems(Active)    Acid reflux (SNOMED CT  :646864985 )  Name of Problem:   Acid reflux ; Recorder:   Chad, RN, Lauraine BRAVO; Confirmation:   Confirmed ; Classification:   Patient Stated ; Code:   646864985 ; Contributor System:   PowerChart ; Last Updated:   12/13/2019 9:43 EDT ; Life Cycle Date:   12/13/2019 ; Life Cycle Status:   Active ; Vocabulary:   SNOMED CT        Diabetes (SNOMED CT  :878410989 )  Name of Problem:   Diabetes ; Recorder:   Chad, RN, Lauraine BRAVO; Confirmation:   Confirmed ; Classification:   Patient Stated ; Code:   878410989 ; Contributor System:   PowerChart ; Last Updated:   12/13/2019 9:44 EDT ; Life Cycle Date:   12/13/2019 ; Life Cycle Status:   Active ; Vocabulary:   SNOMED CT        HTN (hypertension) (SNOMED CT  :8784255987 )  Name of Problem:   HTN (hypertension) ; Recorder:   Devora, RN, Lauraine BRAVO; Confirmation:   Confirmed ; Classification:   Patient Stated ; Code:   8784255987 ; Contributor System:   PowerChart ; Last Updated:   12/13/2019 9:43 EDT ; Life Cycle Date:   12/13/2019 ; Life Cycle Status:   Active ; Vocabulary:   SNOMED CT        Hx of CABG (SNOMED CT  :7013342982 )  Name of Problem:   Hx of CABG ; Recorder:   West, RN, Lauraine BRAVO; Confirmation:   Confirmed ; Classification:   Patient Stated ; Code:   7013342982 ; Contributor System:   PowerChart ; Last Updated:   12/13/2019 9:43 EDT ; Life Cycle Date:   12/13/2019 ; Life Cycle Status:   Active ; Vocabulary:   SNOMED CT        Prostate cancer (SNOMED CT  :8225420986 )  Name of Problem:   Prostate cancer ; Recorder:   Devora, RN, Lauraine BRAVO; Confirmation:   Confirmed ; Classification:   Patient Stated ; Code:   8225420986 ; Contributor System:   PowerChart ; Last Updated:   12/13/2019 9:45 EDT ; Life Cycle Date:   12/13/2019 ; Life Cycle Status:   Active ; Vocabulary:   SNOMED CT        Shortness of  breath (IMO  :72576 )  Name of Problem:   Shortness of breath ; Recorder:  SYSTEM,  SYSTEM; Confirmation:   Confirmed ; Classification:   Patient Stated ; Code:   72576 ; Last Updated:   12/13/2019 9:51 EDT ; Life Cycle Date:   12/13/2019 ; Life Cycle Status:   Active ; Vocabulary:   IMO        Wears glasses (SNOMED CT  :661003981 )  Name of Problem:   Wears glasses ; Recorder:   Devora, RN, Lauraine BRAVO; Confirmation:   Confirmed ; Classification:   Patient Stated ; Code:   661003981 ; Contributor System:   PowerChart ; Last Updated:   12/13/2019 9:44 EDT ; Life Cycle Date:   12/13/2019 ; Life Cycle Status:   Active ; Vocabulary:   SNOMED CT          Diagnoses(Active)    Encounter for screening for other viral diseases  Date:   12/15/2019 ; Confirmation:   Confirmed ; Clinical Dx:   Encounter for screening for other viral diseases ; Classification:   Medical ; Clinical Service:   Non-Specified ; Code:   ICD-10-CM ; Probability:   0 ; Diagnosis Code:   Z11.59        Procedure History        -    Procedure History   (As Of: 12/16/2019 13:18:27 EDT)     Anesthesia Minutes:   0 ; Procedure Name:   Arthroscopy of shoulder ; Procedure Minutes:   0 ; Comments:     12/13/2019 9:49 EDT - Devora, RN, Lauraine BRAVO  bilaterally            Procedure Dt/Tm:   2001 ; Anesthesia Minutes:   0 ; Procedure Name:   Colonoscopy ; Procedure Minutes:   0            Procedure Dt/Tm:   2006 ; Anesthesia Minutes:   0 ; Procedure Name:   CABG x 4 - Coronary artery bypass grafts x 4 ; Procedure Minutes:   0            Anesthesia Minutes:   0 ; Procedure Name:   Cardiac catheterisation ; Procedure Minutes:   0 ; Comments:     12/13/2019 9:47 EDT - Devora, RN, Lauraine BRAVO [place] [id number]            Anesthesia Minutes:   0 ; Procedure Name:   Arthroscopy of knee ; Procedure Minutes:   0 ; Comments:     12/13/2019 9:48 EDT - Devora, RN, Sarah E  bilateral            History Confirmation   Problem History Changes PAT :   No   Procedure History Changes PAT :   No   Cleotilde RN, Vernell PARAS -  12/16/2019 13:15 EDT   Anesthesia/Sedation   Anesthesia History :   Prior general anesthesia   SN - Malignant Hyperthermia :   Denies   Previous Problem with Anesthesia :   None   Moderate Sedation History :   Prior sedation for procedure   Previous Problem With Sedation :   None   Symptoms of Sleep Apnea :   Age greater than 50, Hypertension, Male Gender   Symptoms of Sleep Apnea Score :   3    Shortness of Breath Indicator :   Shortness of breath with exercise   Devora OBIE Lauraine BRAVO [place] 12/13/2019 9:32 EDT   Bloodless Medicine   Is Blood Transfusion Acceptable to Patient :   Yes   Grawn,  RN, Lauraine BRAVO - 12/13/2019 9:32 EDT   ID Risk Screen Symptoms   Recent Travel History :   No recent travel   Close Contact with COVID-19 ID :   Preadmission testing patients only   Last 14 days COVID-19 ID :   No   TB Symptom Screen :   No symptoms   Devora OBIE Lauraine BRAVO - 12/13/2019 9:32 EDT   Immunizations   COVID-19 Vaccine Status :   2 Doses received   2 Doses Received Manufacturer :   Moderna vaccine   West, RN, Lauraine BRAVO - 12/13/2019 9:32 EDT   ID COVID-19 Screen   Fever OR Chills :   No   Headache :   No   New or Worsening Cough :   No   Fatigue :   No   Shortness of Breath ID :   No   Myalgia (Muscle Pain) :   No   Dyspnea :   No   Diarrhea :   No   Sore Throat :   No   Nausea :   No   Laryngitis :   No   Sudden Loss of Taste or Smell :   No   Devora OBIE Lauraine BRAVO - 12/13/2019 9:32 EDT   Social History   Social History   (As Of: 12/16/2019 13:18:27 EDT)   Tobacco:        Tobacco use: Never (less than 100 in lifetime).   (Last Updated: 12/13/2019 09:50:34 EDT by Devora, RN, Lauraine BRAVO)          Electronic Cigarette/Vaping:        Never Electronic Cigarette Use.   (Last Updated: 12/13/2019 09:50:50 EDT by Devora, RN, Lauraine BRAVO)          Alcohol:        Current, 1-2 times per week   (Last Updated: 12/13/2019 09:50:42 EDT by Devora, RN, Lauraine BRAVO)          Substance Use:        Opioid Naive - not currently taking opioids   (Last Updated: 12/13/2019 09:50:46 EDT by Devora, RN,  Lauraine BRAVO)            Advance Directive   Advance Directive :   No   Devora RN, Lauraine BRAVO JASMINE 12/13/2019 9:32 EDT   PAT/Clinic Comments   Additional Comments PAT :   [facility] Calls Covid Test     [place], [place], Lauraine BRAVO - 12/13/2019 9:32 EDT   Harm Screen   Injuries/Abuse/Neglect in Household :   Denies   Feels Unsafe at Home :   No   Last 3 mo, thoughts killing self/others :   Patient denies   Cleotilde OBIE Vernell JINNY - 12/16/2019 13:15 EDT

## 2019-12-16 LAB — POCT GLUCOSE: POC Glucose: 137 mg/dL — ABNORMAL HIGH (ref 70.0–120.0)

## 2019-12-16 NOTE — Assessment & Plan Note (Signed)
Pre-Procedure Record - MPEND             Pre-Procedure Record - MPEND Summary                                                            Primary Physician:        Ester Rink L-MD    Case Number:              [id number]    Finalized Date/Time:      12/16/19 13:38:36    Pt. Name:                 Alfred Weiss, Alfred Weiss    D.O.B./Sex:               09/29/1950    Male    Med Rec #:                [id number]    Physician:                Ester Rink L-MD    Financial #:              [id number]    Pt. Type:                 S    Room/Bed:                 /    Admit/Disch:              12/16/19 09:21:00 -    Institution:       MPEND Case Attendance - Pre-Procedure                                                                                     Entry 1                         Entry 2                                                                          Case Attendee             Ester Rink L-MD         Sabra Heck, RN, Jarome Lamas    Role Performed            Surgeon Primary                 Preoperative Nurse    Time In     Time Out     Last Modified By:         Sabra Heck, RN, Jarome Lamas  Sondra Come                              12/16/19 13:06:41               12/16/19 13:06:41      MPEND - Case Times - Pre-Procedure                                                                                        Entry 1                                                                                                          Patient In Room Time      12/16/19 13:08:00               Nurse In Time                   12/16/19 13:08:00    Nurse Out Time            12/16/19 13:38:00               Patient Ready for               12/16/19 13:38:00                                                              Surgery/Procedure     Last Modified By:         Sabra Heck RNJarome Lamas                              12/16/19 13:38:36      MPEND - Case Times - Pre-Procedure Audit                                                          12/16/19 13:38:36         Owner: Manuela Neptune                               Modifier: [email or web address]                                                        <+>  1         Patient Ready for Surgery/Procedure        <+> 1         Nurse Out Time                Finalized By: Sabra Heck, RN, Jarome Lamas      Document Signatures                                                                             Signed By:           Sabra Heck RN, Jarome Lamas 12/16/19 13:38

## 2019-12-16 NOTE — Nursing Note (Signed)
Nursing Discharge Summary - Text       Nursing Discharge Summary Entered On:  12/16/2019 14:19 EDT    Performed On:  12/16/2019 14:19 EDT by Hardin Negus, RN, [place]   Discharge To :   Family support   Transportation :   Private vehicle   Accompanied By :   Family member   Rodney Booze - 12/16/2019 14:19 EDT   Education   Responsible Learner(s) :   No Data Available     Home Caregiver Present for Session :   Yes   Barriers To Learning :   None evident   Teaching Method :   Printed materials   Rodney Booze - 12/16/2019 14:19 EDT

## 2019-12-16 NOTE — Anesthesia Pre-Procedure Evaluation (Signed)
Preanesthesia Evaluation        Patient:   Alfred Weiss, Alfred Weiss             MRN: [id number]            FIN: [id number]               Age:   69 years     Sex:  Male     DOB:  1951/07/30   Associated Diagnoses:   None   Author:   Donne Anon P-MD      Preoperative Information   Procedure/ Case: EGD   Surgeon scheduled: Ester Rink L-MD   NPO:  NPO greater than 8 hours.    Anesthesia history     Patient's history: negative.     Family's history: negative.        Health Status   Allergies:    Allergic Reactions (Selected)  No Known Allergies   Current medications:    Home Medications (15) Active  amLODIPine 5 mg oral tablet 5 mg = 1 tabs, Oral, Daily  Aspirin Low Dose 81 mg oral delayed release tablet 81 mg = 1 tabs, Oral, Daily  clopidogrel 75 mg oral tablet 75 mg = 1 tabs, Oral, Daily  Entresto 49 mg-51 mg oral tablet   ezetimibe 10 mg oral tablet 10 mg = 1 tabs, Oral, Daily  famotidine 40 mg oral tablet 40 mg = 1 tabs, Oral, Once a Day (at bedtime)  finasteride 5 mg oral tablet 5 mg = 1 tabs, Oral, Daily  isosorbide mononitrate 120 mg oral tablet, extended release 120 mg = 1 tabs, Oral, qAM  Jardiance 10 mg oral tablet   Metoprolol Succinate ER 50 mg oral tablet, extended release 50 mg = 1 tabs, Oral, Daily  nitroglycerin 0.4 mg sublingual tablet 0.4 mg = 1 tabs, PRN, SL, q3min  NovoLIN 70/30 subcutaneous suspension 70 units, Subcutaneous, Once  omeprazole 40 mg oral delayed release capsule 40 mg = 1 caps, Oral, Daily  ranolazine 500 mg oral tablet, extended release 500 mg = 1 tabs, Oral, BID  rosuvastatin 40 mg oral tablet 40 mg = 1 tabs, Oral, Daily  ,    Medications (1) Active  Scheduled: (0)  Continuous: (1)  Lactated Ringers Injection solution 1,000 mL  1,000 mL, IV, 30 mL/hr  PRN: (0)     Problem list:    Active Problems (7)  Acid reflux   Diabetes   HTN (hypertension)   Hx of CABG   Prostate cancer   Shortness of breath   Wears glasses         Histories   Past Medical History:    No active or resolved past  medical history items have been selected or recorded.   Procedure history:    CABG x 4 - Coronary artery bypass grafts x 4 [id number]) in 2006 at 71 Years.  Colonoscopy [id number]) in 2001 at 3 Years.  Cardiac catheterisation [id number]).  Comments:  12/13/2019 9:47 EDT - Melina Modena RN, Mariann Laster [id number]  Arthroscopy of knee ([id number]).  Comments:  12/13/2019 9:48 EDT - Melina Modena, RN, Sarah E  bilateral  Arthroscopy of shoulder ([id number]).  Comments:  12/13/2019 9:49 EDT - Melina Modena, RN, Rande Brunt  bilaterally   Social History        Social & Psychosocial Habits    Alcohol  12/13/2019  Use: Current    Frequency: 1-2 times per week    Substance Use  12/13/2019  Opioid Assessment Opioid  Naive-not taking    Tobacco  12/13/2019  Use: Never (less than 100 in l    Electronic Cigarette/Vaping  12/13/2019  Electronic Cigarette Use: Never  .        Physical Examination   Vital Signs   [id number] EDT Systolic Blood Pressure [id number] mmHg    Diastolic Blood Pressure 82 mmHg    Temperature Oral 36.7 degC    Heart Rate Monitored 76 bpm    Respiratory Rate 18 br/min    SpO2 97 %         Vital Signs (last 24 hrs)_____  Last Charted___________  Temp Oral     36.7 degC  (APR 05 13:21)  Resp Rate         18 br/min  (APR 05 13:21)  SBP      134 mmHg  (APR 05 13:21)  DBP      82 mmHg  (APR 05 13:21)  SpO2      97 %  (APR 05 13:21)  Weight      109.8 kg  (APR 05 13:21)  Height      182.9 cm  (APR 05 13:21)  BMI      32.82  (APR 05 13:22)     Measurements from flowsheet : Measurements   12/16/2019 13:22 EDT Body Mass Index est meas 32.82 kg/m2    Body Mass Index Measured 32.82 kg/m2   12/16/2019 13:21 EDT Height/Length Measured 182.9 cm    Weight Measured 109.8 kg      Pain assessment:  Pain Assessment   12/16/2019 13:21 EDT       Numeric Rating Pain Scale 0 = No pain     .    General:          Stress: No acute distress.         Appearance: Within normal limits.    Airway:           Mallampati classification: II (soft palate, fauces, uvula visible).         Thyromental Distance: Normal.         Mouth: Teeth ( Within normal limits ).         Throat: Within normal limits.    Head:  Normocephalic, Atraumatic.    Neck:  Full range of motion.    Respiratory:  Lungs are clear to auscultation, Breath sounds are equal.    Cardiovascular:  Normal rate, No murmur.    Gastrointestinal:  Deferred.    Musculoskeletal     Deferred.     Neurologic:  Alert, Oriented.       Review / Management   Results review:     No qualifying data available, Lab results: 12/16/2019 13:37 EDT       Glucose POC               137.0 mg/dL  HI  .       Assessment and Plan   American Society of Anesthesiologists#(ASA) physical status classification:  Class III.    Anesthetic Preoperative Plan     Anesthetic technique: General anesthesia, Intravenous.     Opioid Assessment: Opioid Na????ve.     Risks discussed: hypotension, serious complications.     Signature Line     Electronically Signed on 12/16/2019 01:47 PM EDT   ________________________________________________   Donne Anon P-MD

## 2019-12-16 NOTE — Op Note (Signed)
Phase II Record - MPEND             Phase II Record - MPEND Summary                                                                 Primary Physician:        Hillary Bow L-MD    Case Number:              [id number]    Finalized Date/Time:      12/16/19 14:32:53    Pt. Name:                 Alfred Weiss, Alfred Weiss    D.O.B./Sex:               16-Nov-1950    Male    Med Rec #:                [id number]    Physician:                Hillary Bow L-MD    Financial #:              [id number]    Pt. Type:                 S    Room/Bed:                 /    Admit/Disch:              12/16/19 09:21:00 -    Institution:       MPEND Case Attendance - Phase II                                                                                          Entry 1                         Entry 2                                                                          Case Attendee             Hillary Bow L-MD         Vear Clock, RN, Lanora Manis  C    Role Performed            Surgeon Primary                 Phase II Nurse    Time In     Time Out     Last Modified By:         Vear Clock, RN, Levert Feinstein, RNDorise Hiss 12/16/19 14:09:36             C 12/16/19 14:09:36      MPEND - Case Times - Phase II                                                                                             Entry 1                                                                                                          Phase II In               12/16/19 14:08:00               Phase II Out                    12/16/19 14:32:00    Phase II Discharge        12/16/19 14:32:00    Time     Last Modified By:         Vear Clock RNDorise Hiss 12/16/19 14:32:51      MPEND - Case Times - Phase II Audit                                                              12/16/19 14:32:51         Owner: Marshell Garfinkel                                Modifier: [email or web address]                                                        <+>  1         Phase II Out        <+> 1         Phase II Discharge Time                Finalized By: Vear Clock, RN, Dorise Hiss      Document Signatures                                                                             Signed By:           Vear Clock, RN, Dorise Hiss 12/16/19 14:32

## 2019-12-16 NOTE — Procedures (Signed)
Procedure Record - MPEND             Procedure Record - MPEND Summary                                                                Primary Physician:        Ester Rink L-MD    Case Number:              [id number]    Finalized Date/Time:      12/16/19 14:32:24    Pt. Name:                 Alfred Weiss, Alfred Weiss    D.O.B./Sex:               1951-08-11    Male    Med Rec #:                [id number]    Physician:                Ester Rink L-MD    Financial #:              [id number]    Pt. Type:                 S    Room/Bed:                 /    Admit/Disch:              12/16/19 09:21:00 -    Institution:       MPEND - Case Times                                                                                                        Entry 1                                                                                                          Patient      In Room Time             12/16/19 13:58:00               Out Room Time                   12/16/19 14:07:00    Anesthesia     Procedure      Start  Time               12/16/19 14:01:00               Stop Time                       12/16/19 14:04:00    Last Modified By:         Aldine Contes                              12/16/19 14:07:06      MPEND - Case Times Audit                                                                         12/16/19 14:07:06         Owner: Nicholes Rough                               Modifier: [email or web address]                                                        <+> 1         Out Room Time     12/16/19 14:04:14         Owner: Nicholes Rough                               Modifier: [email or web address]                                                        <+> 1         Stop Time     12/16/19 14:02:17         Owner: Nicholes Rough                               Modifier: [email or web address]                                                        <+> 1         In Room Time        MPEND - Case Attendance  Entry 1                         Entry 2                         Entry 3                                          Case Attendee             Ester Rink L-MD         [facility], RN, Acquanetta Sit P-MD    Role Performed            Surgeon Primary                 Circulator                      Anesthesiologist    Time In                   12/16/19 13:58:00               12/16/19 13:58:00               12/16/19 13:58:00    Time Out                  12/16/19 14:07:00               12/16/19 14:07:00               12/16/19 14:07:00    Procedure                 Esophagastroduodenoscopy        Esophagastroduodenoscopy        Esophagastroduodenoscopy    Last Modified By:         [facility], RN, Donnelly Angelica, RN, Zetta Bills            [facility], RNZetta Bills                              12/16/19 14:24:31               12/16/19 14:24:31               12/16/19 14:24:31                                Entry 4                                                                                                          Case Attendee  Donna Christen    Role Performed            Endoscopy Technician    Time In                   12/16/19 13:58:00    Time Out                  12/16/19 14:07:00    Procedure                 Esophagastroduodenoscopy    Last Modified By:         Aldine Contes                              12/16/19 14:24:31      MPEND - Case Attendance Audit                                                                    12/16/19 14:24:31         Owner: Nicholes Rough                               Modifier: [email or web address]                                                            1     <*> Procedure                              Esophagastroduodenoscopy            2     <*> Procedure                              Esophagastroduodenoscopy            3     <*> Procedure                              Esophagastroduodenoscopy            4     <+> Time In            4     <+> Time Out             4     <*> Procedure                              Esophagastroduodenoscopy     12/16/19 14:21:10         Owner: Nicholes Rough                               Modifier: Nicholes Rough  1     <*> Procedure                              Esophagastroduodenoscopy            2     <*> Procedure                              Esophagastroduodenoscopy            3     <+> Time In            3     <+> Time Out            3     <*> Procedure                              Esophagastroduodenoscopy        <+> 4         Case Attendee        <+> 4         Role Performed        <+> 4         Procedure     12/16/19 14:20:29         Owner: Nicholes Rough                               Modifier: [email or web address]                                                            1     <*> Procedure                              Esophagastroduodenoscopy            2     <+> Time In            2     <+> Time Out            2     <*> Procedure                              Esophagastroduodenoscopy        <+> 3         Case Attendee        <+> 3         Role Performed        <+> 3         Procedure     12/16/19 14:20:16         Owner: Nicholes Rough                               Modifier: [email or web address]                                                        <+>  2         Case Attendee        <+> 2         Role Performed        <+> 2         Procedure     12/16/19 14:18:50         Owner: Nicholes Rough                               Modifier: FRIEJE                                                            1     <+> Time Out            1     <*> Procedure                              Esophagastroduodenoscopy     12/16/19 14:04:10         Owner: Nicholes Rough                               Modifier: FRIEJE                                                            1     <+> Time In            1     <*> Procedure                              Esophagastroduodenoscopy        MPEND - Patient Positioning                                                                      Pre-Care Text:            A.240 Assesses baseline skin condition   A.280 Identifies baseline musculoskeletal status   A.280.1 Identifies           physical alterations that require additional precautions for procedure-specific positioning   A.510.8 Maintains           patient's dignity and privacy   Im.120 Implements protective measures to prevent skin/tissue injury due to           mechanical sources   Im.40 Positions the patient   Im.80 Applies safety devices                              Entry 1  Procedure                 Esophagastroduodenoscopy        Body Position                   Lateral Right Up    Left Arm Position         Resting at Side                 Right Arm Position              Resting at Side    Left Leg Position         Extended                        Right Leg Position              Extended    Feet Uncrossed            Yes                             Pressure Points                 Yes                                                              Checked     Positioning Device        Foam Wedge                      Positioned By                   Donne Anon P-MD,                                                                                              [place], RN, Zetta Bills    Outcome Met (O.80)        Yes    Last Modified ByStarleen Blue, [place], Zetta Bills                              12/16/19 14:22:19    Post-Care Text:            E.10 Evaluates for signs and symptoms of physical injury to skin and tissue  E.290 Evaluates musculoskeletal           status  O.80 Patient is free from signs and symptoms of injury related to positioning  O.120 the patient is           free from signs and symptoms of injury related to transfer/transport   O.250 Patient's musculoskeletal status  is maintained at or improved from baseline levels      MPEND - Patient Positioning Audit                                                                 12/16/19 14:22:19         Owner: Nicholes Rough                               Modifier: FRIEJE                                                            1     <+> Positioned By            1     <*> Procedure                              Esophagastroduodenoscopy            1     <+> Outcome Met (O.80)        MPEND - Skin Assessment                                                                         Pre-Care Text:            A.240 Assesses baseline skin condition  Im.120 Implements protective measures to prevent skin or tissue injury           due to mechanical sources   Im.280.1 Implements progective measures to prevent skin or tissue injury due to           thermal sources  Im.360 Monitors for signs and symptons of infection                              Entry 1                                                                                                          Skin Integrity            Intact    Last Modified By:         Starleen Blue, RN, Zetta Bills  12/16/19 14:22:45    Post-Care Text:            E.10 Evaluates for signs and symptoms of physical injury to skin and tissue  E.270 Evaluate tissue perfusion           O.60 Patient is free from signs and symptoms of injury caused by extraneous objects    O.210 Patinet's tissue           perfusion is consistent with or improved from baseline levels      MPEND - Time Out - Procedure                                                                    Pre-Care Text:            A.10 Confirms patient identity  A.20 Verifies operative procedure, surgical site, and laterality  A.20.1           Verifies consent for planned procedure  A.30 Verifies allergies                              Entry 1                                                                                                          Procedure                 Esophagastroduodenoscopy        Patient name and                Yes                                                               DOB confirmed     Surgical procedure        Yes                             Correct surgical                Yes    to be performed                                           site marked and     confirmed and  initials are     verified by                                               visible through     completed surgical                                        prepped and draped     consent                                                   field (or                                                               alternative ID band                                                               used), if applicable     Allergies discussed       Yes                             Anticoagulation                 Yes                                                              status confirmed     Time Out Complete         12/16/19 13:59:00    Last Modified By:         Starleen Blue RN, Zetta Bills                              12/16/19 14:23:43    Post-Care Text:            E.30 Evaluates verification process for correct patient, site, side, and level surgery      MPEND - Debrief - Procedure                                                                     Pre-Care Text:            Im.330 Manages  specimen handling and disposition                              Entry 1                                                                                                          Procedure                 Esophagastroduodenoscopy        Actual procedure                Yes                                                              performed confirmed     Confirm specimens         Yes                             Patient recovery                Yes    and specimens                                             plan confirmed     labeled     appropriately (if     applicable)     Debrief Complete          12/16/19 14:06:00    Last Modified By:         Starleen Blue RN, Zetta Bills                               12/16/19 14:24:21    Post-Care Text:            E.800 Ensures continuity of care  E.50 Evaluates results of the surgical count  O.30 Patient's procedure is           performed on the correct site, side, and level  O.50 patient's current status is communicated throughout the           continuum of care  O.40 Patient's specimen(s) is managed in the appropriate manner      MPEND - General Case Data  Entry 1                                                                                                          Case Information      ASA Class                3                               Case Level                      Level 1     OR                       MP Endo 01                      Specialty                       Gastroenterology (SN)     Wound Class              No Incision    Preop Diagnosis           Z21.9 R11.10 I25.10             Postop Diagnosis                Z21.9 R11.10 I25.10    Last Modified By:         Starleen Blue RNZetta Bills                              12/16/19 14:19:19      MPEND - General Case Data Audit                                                                  12/16/19 14:19:19         Owner: Nicholes Rough                               Modifier: [email or web address]                                                        <+> 1         Postop Diagnosis        MPEND - Procedures  Entry 1                                                                                                          Procedure     Description      Procedure                Esophagastroduodenoscopy        Surgical Procedure              EGD                                                              Text     Primary Procedure         Yes                             Primary Surgeon                 Ester Rink L-MD     Start                     12/16/19 14:01:00               Stop                            12/16/19 14:04:00    Anesthesia Type           Monitored Anesthesia            Surgical Service                Gastroenterology (SN)                              Care    Wound Class               No Incision    Last Modified By:         Starleen Blue RN, Zetta Bills                              12/16/19 14:13:11      MPEND - Carbon Dioxide Insufflation                                                                                       Entry  1                                                                                                          Procedure                 Esophagastroduodenoscopy        Carbon Dioxide                  REGULATOR CO2 ON WALL                                                              Insufflator     Insufflation Mode         Managed Flow                    Flow Rate                       2 L/min    Last Modified By:         [facility], RN, Zetta Bills                              12/16/19 14:18:54      MPEND - Specimens                                                                               Pre-Care Text:            A.20 Verifies operative procedure, surgical site, and laterality  Im.320 Manages culture specimen collection           Im.330 Manages specimen handling and disposition                              Entry 1                         Entry 2                                                                          Description  A. Stomach                      B. Distal esphageal                                                              biopsy    Specimen Type             Routine                         Routine    Date/Time in     Formalin (for     breast tissue only)     Last Modified By:         [facility], RN, Donnelly Angelica, RN, Zetta Bills                              12/16/19 14:26:24               12/16/19 14:28:42    Post-Care Text:            E.40 Evaluates correct  processes have been performed for specimen handling and disposition  O.40 Patient's           specimen(s) is managed in the appropriate manner      MPEND - Specimens Audit                                                                          12/16/19 14:28:42         Owner: Nicholes Rough                               Modifier: [email or web address]                                                        <+> 2         Description        <+> 2         Specimen Type        MPEND - Transfer  Entry 1                                                                                                          Transferred By            [facility], Annabella, Zetta Bills,           Via                             Buelah Manis P-MD    Post-op Destination       ACU    Skin Assessment      Condition                Intact    Last Modified By:         Starleen Blue RN, Zetta Bills                              12/16/19 14:28:52      Case Comments                                                                                         <None>              Finalized By: Aldine Contes      Document Signatures                                                                             Signed By:           Aldine Contes 12/16/19 14:32

## 2019-12-23 ENCOUNTER — Telehealth: Payer: Self-pay | Admitting: Internal Medicine

## 2019-12-23 NOTE — Progress Notes (Signed)
  Chronic Care Management   Outreach Note  12/23/2019 Name: Russell Trevino MRN: [id number] DOB: [birth year]  Referred by: Binnie Rail, MD Reason for referral : No chief complaint on file.   An unsuccessful telephone outreach was attempted today. The patient was referred to the pharmacist for assistance with care management and care coordination.   Follow Up Plan:   Raynicia Dukes [employer] Scheduler

## 2019-12-27 ENCOUNTER — Telehealth: Payer: Self-pay | Admitting: Internal Medicine

## 2019-12-27 DIAGNOSIS — I5042 Chronic combined systolic (congestive) and diastolic (congestive) heart failure: Secondary | ICD-10-CM

## 2019-12-27 NOTE — Progress Notes (Signed)
°  Chronic Care Management   Note  12/27/2019 Name: Russell Trevino MRN: [id number] DOB: 13-Sep-1950  Russell Trevino is a 69 y.o. year old male who is a primary care patient of Burns, Claudina Lick, MD. I reached out to Russell Trevino by phone today in response to a referral sent by Russell Trevino's PCP, Binnie Rail, MD.   Russell Trevino was given information about Chronic Care Management services today including:  1. CCM service includes personalized support from designated clinical staff supervised by his physician, including individualized plan of care and coordination with other care providers 2. 24/7 contact phone numbers for assistance for urgent and routine care needs. 3. Service will only be billed when office clinical staff spend 20 minutes or more in a month to coordinate care. 4. Only one practitioner may furnish and bill the service in a calendar month. 5. The patient may stop CCM services at any time (effective at the end of the month) by phone call to the office staff.   Patient agreed to services and verbal consent obtained.   Follow up plan:   Raynicia Dukes [employer] Scheduler

## 2020-01-08 LAB — COMPREHENSIVE METABOLIC PANEL
ALT: 23 U/L (ref 0–41)
AST: 25 U/L (ref 0–40)
Albumin/Globulin Ratio: 1.6 mmol/L (ref 1.00–2.00)
Albumin: 4.4 g/dL (ref 3.5–5.2)
Alk Phosphatase: 88 U/L (ref 40–130)
Anion Gap: 10 mmol/L (ref 2–17)
BUN: 26 mg/dL — ABNORMAL HIGH (ref 8–23)
CO2: 26 mmol/L (ref 22–29)
Calcium: 9.5 mg/dL (ref 8.8–10.2)
Chloride: 102 mmol/L (ref 98–107)
Creatinine: 1.6 mg/dL — ABNORMAL HIGH (ref 0.7–1.3)
GFR African American: 50 mL/min/{1.73_m2} — ABNORMAL LOW (ref >=90)
GFR Non-African American: 43 mL/min/{1.73_m2} — ABNORMAL LOW (ref >=90)
Globulin: 3 g/dL (ref 1.9–4.4)
Glucose: 186 mg/dL — ABNORMAL HIGH (ref 70–99)
OSMOLALITY CALCULATED: 285 mOsm/kg (ref 270–287)
Potassium: 5.4 mmol/L — ABNORMAL HIGH (ref 3.5–5.3)
Sodium: 138 mmol/L (ref 135–145)
Total Bilirubin: 1.4 mg/dL — ABNORMAL HIGH (ref 0.00–1.20)
Total Protein: 7.1 g/dL (ref 6.4–8.3)

## 2020-01-08 LAB — CBC
Hematocrit: 43.2 % (ref 38.0–52.0)
Hemoglobin: 14.5 g/dL (ref 13.0–17.3)
MCH: 29.8 pg (ref 27.0–34.5)
MCHC: 33.6 g/dL (ref 32.0–36.0)
MCV: 88.7 fL (ref 84.0–100.0)
MPV: 11.2 fL (ref 7.2–13.2)
NRBC Absolute: 0 10*3/uL (ref 0.000–0.012)
NRBC Automated: 0 % (ref 0.0–0.2)
Platelets: 167 10*3/uL (ref 140–440)
RBC: 4.87 x10e6/mcL (ref 4.00–5.60)
RDW: 14.6 % (ref 11.0–16.0)
WBC: 7.5 10*3/uL (ref 3.8–10.6)

## 2020-01-08 LAB — HEMOGLOBIN A1C
Est. Avg. Glucose, WB: 163
Est. Avg. Glucose-calculated: 183
Hemoglobin A1C: 7.3 % — ABNORMAL HIGH (ref 4.0–6.0)

## 2020-01-20 NOTE — Addendum Note (Signed)
Addended by: Karle Barr on: 01/20/2020 08:01 AM   Modules accepted: Orders

## 2020-01-23 ENCOUNTER — Telehealth: Payer: Medicare Other

## 2020-01-27 ENCOUNTER — Telehealth: Payer: Self-pay | Admitting: Internal Medicine

## 2020-01-27 NOTE — Progress Notes (Signed)
°  Chronic Care Management   Outreach Note  01/27/2020 Name: Russell Trevino MRN: [id number] DOB: 1950/09/16  Referred by: Binnie Rail, MD Reason for referral : No chief complaint on file.   An unsuccessful telephone outreach was attempted today. The patient was referred to the pharmacist for assistance with care management and care coordination.   This note is not being shared with the patient for the following reason: To respect privacy (The patient or proxy has requested that the information not be shared).  Follow Up Plan:   Earney Hamburg [employer] Sched[facility]

## 2020-01-29 ENCOUNTER — Telehealth: Payer: Self-pay | Admitting: Internal Medicine

## 2020-01-29 NOTE — Progress Notes (Signed)
  Chronic Care Management   Outreach Note  01/29/2020 Name: Russell Trevino MRN: [id number] DOB: 1950/09/23  Referred by: Binnie Rail, MD Reason for referral : No chief complaint on file.   An unsuccessful telephone outreach was attempted today. The patient was referred to the pharmacist for assistance with care management and care coordination.   This note is not being shared with the patient for the following reason: To respect privacy (The patient or proxy has requested that the information not be shared).  Follow Up Plan:   Earney Hamburg [employer] [facility]

## 2020-02-07 ENCOUNTER — Telehealth: Payer: Self-pay | Admitting: Internal Medicine

## 2020-02-07 NOTE — Progress Notes (Signed)
°  Chronic Care Management   Note  02/07/2020 Name: Russell Trevino MRN: [id number] DOB: 15-Nov-1950  [name] is a 69 y.o. year old male who is a primary care patient of Burns, Claudina Lick, MD. I reached out to [name] by phone today in response to a referral sent by [name]'s PCP, Binnie Rail, MD.   [name] was given information about Chronic Care Management services today including:  1. CCM service includes personalized support from designated clinical staff supervised by his physician, including individualized plan of care and coordination with other care providers 2. 24/7 contact phone numbers for assistance for urgent and routine care needs. 3. Service will only be billed when office clinical staff spend 20 minutes or more in a month to coordinate care. 4. Only one practitioner may furnish and bill the service in a calendar month. 5. The patient may stop CCM services at any time (effective at the end of the month) by phone call to the office staff.   Patient agreed to services and verbal consent obtained.   This note is not being shared with the patient for the following reason: To respect privacy (The patient or proxy has requested that the information not be shared).  Follow up plan:   Earney Hamburg [employer] Scheduler

## 2020-03-23 ENCOUNTER — Telehealth: Payer: Self-pay

## 2020-03-23 NOTE — Progress Notes (Signed)
Subjective:    Patient ID: Russell Trevino, male    DOB: 20-Feb-1951, 69 y.o.   MRN: [id number]  HPI The patient is here for an acute visit.   ? UTI:  He thinks he has a uti.  He takes jardiance.  He is urinating frequently and has a burning sensation when urinating.  It started about 4 days ago. He stopped jardiance three days ago. He had a uti years and this feels the same.  He denies nausea, abdominal pain and fever.  Sugars running 160-170.  He has not been compliant with a diabetic diet recently.   Medications and allergies reviewed with patient and updated if appropriate.  Patient Active Problem List   Diagnosis Date Noted   BPH (benign prostatic hyperplasia) 11/20/2019   CKD (chronic kidney disease) stage 4, GFR 15-29 ml/min (HCC) 08/04/2019   Coronary artery disease involving native coronary artery of native heart with angina pectoris ([place]) 03/04/2019   CKD stage 3 due to type 2 diabetes mellitus ([place]) 01/28/2019   Ischemic cardiomyopathy 11/05/2018   Chronic combined systolic and diastolic heart failure ([place]) 11/05/2018   Diabetes mellitus ([place]) 08/26/2017   GERD (gastroesophageal reflux disease) 07/13/2017   Thoracic back pain 06/21/2016   Hx of CABG 05/26/2016   Prostate cancer ([place]) 05/26/2016   Essential hypertension, benign 05/26/2016   Mixed hyperlipidemia 05/26/2016   Coronary artery disease due to calcified coronary lesion 03/25/2013    Current Outpatient Medications on File Prior to Visit  Medication Sig Dispense Refill   amLODipine (NORVASC) 5 MG tablet Take 1 tablet (5 mg total) by mouth daily. 90 tablet 3   aspirin 81 MG tablet Take 81 mg by mouth daily at 12 noon.      BD VEO INSULIN SYR ULTRAFINE 31G X 15/64" 1 ML MISC use as directed TO INJECT INSULIN  twice a day 10 each 0   clopidogrel (PLAVIX) 75 MG tablet Take 1 tablet (75 mg total) by mouth daily. 90 tablet 3   empagliflozin (JARDIANCE) 10 MG TABS tablet Take 10 mg by mouth  daily before breakfast. 30 tablet 5   ENTRESTO 49-51 MG TAKE 1 TABLET BY MOUTH TWICE DAILY 60 tablet 11   ezetimibe (ZETIA) 10 MG tablet Take 1 tablet (10 mg total) by mouth every evening. 90 tablet 1   finasteride (PROSCAR) 5 MG tablet TAKE 1 TABLET(5 MG) BY MOUTH DAILY 90 tablet 1   furosemide (LASIX) 80 MG tablet Take 1 tablet (80 mg total) by mouth every other day. TAKE ON EMPTY STOMACH 90 tablet 3   insulin NPH-regular Human (HUMULIN 70/30) (70-30) 100 UNIT/ML injection 90 units with breakfast, and 30 units with supper 50 mL 3   isosorbide mononitrate (IMDUR) 120 MG 24 hr tablet Take 1 tablet (120 mg total) by mouth 2 (two) times daily. 180 tablet 3   metoprolol succinate (TOPROL-XL) 50 MG 24 hr tablet Take 1 tablet (50 mg total) by mouth daily at 12 noon. Take with or immediately following a meal. 90 tablet 3   nitroGLYCERIN (NITROSTAT) 0.4 MG SL tablet DISSOLVE 1 TABLET UNDER THE TONGUE EVERY 5 MINS A S NEEDED FOR CHEST PAIN CALL MD/911 IF TAKE 2 DOSES. MAX 3 DOSES PER DAY 25 tablet 0   omeprazole (PRILOSEC) 40 MG capsule Take capsule by mouth every morning     pantoprazole (PROTONIX) 40 MG tablet TAKE 1 TABLET(40 MG) BY MOUTH TWICE DAILY 90 tablet 3   PEPCID 40 MG tablet Take 40  mg by mouth at bedtime.     potassium chloride SA (K-DUR) 20 MEQ tablet Take 1 tablet (20 mEq total) by mouth daily as needed (when you take lasix). 90 tablet 1   ranolazine (RANEXA) 500 MG 12 hr tablet Take 1 tablet (500 mg total) by mouth 2 (two) times daily. 180 tablet 3   rosuvastatin (CRESTOR) 40 MG tablet Take 1 tablet (40 mg total) by mouth every evening. 90 tablet 3   No current facility-administered medications on file prior to visit.    Past Medical History:  Diagnosis Date   BPH (benign prostatic hypertrophy)    Cardiomyopathy, ischemic    Coronary artery disease    at [facility] [place] 2009 (GRAFTS PATENT), cath 05/2018 w/ SVG-PDA 100%>>med rx   Diabetes mellitus, type 2 (HCC)     Frequency of urination    GERD (gastroesophageal reflux disease)    Gross hematuria    History of CHF (congestive heart failure) 2007   History of kidney stones    History of myocardial infarction 2006, 2019   2006>>CABG, 2019 cath w/ med rx   History of urinary retention    Hyperlipidemia    Nocturia    Renal calculi    BILATERAL PER CT SCAN--  NON-OBSTRUCTIVE   S/P CABG x 4 2006    Past Surgical History:  Procedure Laterality Date   CARDIAC CATHETERIZATION  01/17/2008   GRAFTS PATENT / EF 40%,  DR SANTOS ([facility])   CORONARY ARTERY BYPASS GRAFT  04/2005   LIMA-LAD, L radial-OM-D1 and SVG-PDA   CYSTO / RIGHT URETERAL STENT PLACEMENT  10-08-1999   CYSTO/  REMOVAL BLADDER STONES  01-14-2010   CYSTOSCOPY  10/16/2012   Procedure: CYSTOSCOPY;  Surgeon: Malka So, MD;  Location: [facility];  Service: Urology;  Laterality: N/A;  Cystoscopy with Clot Evacuation and fulguration bladder neck.   EXTRACORPOREAL SHOCK WAVE LITHOTRIPSY  10-15-2009   RIGHT   KNEE ARTHROSCOPY  1978   RIGHT   LEFT SHOULDER SURGERY   2002   LUMBAR FUSION  09-10-2008   L4 - L5   PERCUTANEOUS NEPHROSTOLITHOTOMY  1979   PROSTATE BIOPSY  10/16/2012   Procedure: PROSTATE BIOPSY;  Surgeon: Malka So, MD;  Location: [facility];  Service: Urology;  Laterality: N/A;  Through ultrasound.   RIGHT/LEFT HEART CATH AND CORONARY/GRAFT ANGIOGRAPHY N/A 10/26/2018   Procedure: RIGHT/LEFT HEART CATH AND CORONARY/GRAFT ANGIOGRAPHY;  Surgeon: Martinique, Peter M, MD;  Location: [place] [facility];  Service: Cardiovascular;  Laterality: N/A;   SHOULDER ARTHROSCOPY W/ SUBACROMIAL DECOMPRESSION AND DISTAL CLAVICLE EXCISION  10-11-2004   AND PARTIAL ROTATOR CUFF REPAIR   TRANSTHORACIC ECHOCARDIOGRAM  25/95/6387   LV SYSTOLIC FUNCTION MILDLY REDUCED/ EF 48%/ LV FILLING PATTERN  IS IMPAIRED/ HYPOKINESIS IN THE MID AND BASALAR SEPTUM & ANTEROSEPTUM   TURP VAPORIZATION  01-19-2010    BPH W/ BOO   URETEROLITHOTOMY  1976    Social History   Socioeconomic History   Marital status: Married    Spouse name: Not on file   Number of children: 4   Years of education: Not on file   Highest education level: Not on file  Occupational History   Not on file  Tobacco Use   Smoking status: Never Smoker   Smokeless tobacco: Never Used  Vaping Use   Vaping Use: Never used  Substance and Sexual Activity   Alcohol use: Yes    Comment: Occ.    Drug use: No  Sexual activity: Not on file  Other Topics Concern   Not on file  Social History Narrative   Lives in [place], [place], has family and friends in [place].   Social Determinants of Health   Financial Resource Strain:    Difficulty of Paying Living Expenses:   Food Insecurity:    Worried About Charity fundraiser in the Last Year:    Arboriculturist in the Last Year:   Transportation Needs:    Film/video editor (Medical):    Lack of Transportation (Non-Medical):   Physical Activity:    Days of Exercise per Week:    Minutes of Exercise per Session:   Stress:    Feeling of Stress :   Social Connections:    Frequency of Communication with Friends and Family:    Frequency of Social Gatherings with Friends and Family:    Attends Religious Services:    Active Member of Clubs or Organizations:    Attends Music therapist:    Marital Status:     Family History  Problem Relation Age of Onset   Diabetes Father    Stomach cancer Neg Hx    Colon cancer Neg Hx    Rectal cancer Neg Hx    Esophageal cancer Neg Hx    Pancreatic cancer Neg Hx     Review of Systems  Constitutional: Positive for diaphoresis (the other night). Negative for chills and fever.  Gastrointestinal: Negative for abdominal pain and nausea.  Genitourinary: Positive for dysuria and frequency. Negative for difficulty urinating and hematuria.  Musculoskeletal: Positive for back pain.         Objective:   Vitals:   03/24/20 1519  BP: 126/70  Pulse: 80  Temp: 98.4 F (36.9 C)  SpO2: 96%   BP Readings from Last 3 Encounters:  03/24/20 126/70  11/20/19 116/60  11/20/19 136/70   Wt Readings from Last 3 Encounters:  03/24/20 241 lb (109.3 kg)  11/20/19 248 lb 6.4 oz (112.7 kg)  11/20/19 248 lb (112.5 kg)   Body mass index is 32.69 kg/m.   Physical Exam Constitutional:      General: He is not in acute distress.    Appearance: Normal appearance. He is not ill-appearing.  HENT:     Head: Normocephalic and atraumatic.  Abdominal:     General: There is no distension.     Palpations: Abdomen is soft.     Tenderness: There is no abdominal tenderness. There is no right CVA tenderness, left CVA tenderness, guarding or rebound.  Skin:    General: Skin is warm and dry.  Neurological:     Mental Status: He is alert.            Assessment & Plan:    See Problem List for Assessment and Plan of chronic medical problems.    This visit occurred during the SARS-CoV-2 public health emergency.  Safety protocols were in place, including screening questions prior to the visit, additional usage of staff PPE, and extensive cleaning of exam room while observing appropriate contact time as indicated for disinfecting solutions.

## 2020-03-23 NOTE — Telephone Encounter (Deleted)
Error

## 2020-03-24 ENCOUNTER — Ambulatory Visit (INDEPENDENT_AMBULATORY_CARE_PROVIDER_SITE_OTHER): Payer: Medicare Other | Admitting: Internal Medicine

## 2020-03-24 ENCOUNTER — Other Ambulatory Visit: Payer: Self-pay

## 2020-03-24 ENCOUNTER — Encounter: Payer: Self-pay | Admitting: Internal Medicine

## 2020-03-24 VITALS — BP 126/70 | HR 80 | Temp 98.4°F | Ht 72.0 in | Wt 241.0 lb

## 2020-03-24 DIAGNOSIS — N3000 Acute cystitis without hematuria: Secondary | ICD-10-CM | POA: Diagnosis not present

## 2020-03-24 DIAGNOSIS — R300 Dysuria: Secondary | ICD-10-CM | POA: Diagnosis not present

## 2020-03-24 LAB — POC URINALSYSI DIPSTICK (AUTOMATED)
Bilirubin, UA: NEGATIVE
Glucose, UA: POSITIVE — AB
Ketones, UA: NEGATIVE
Leukocytes, UA: NEGATIVE
Nitrite, UA: NEGATIVE
Protein, UA: POSITIVE — AB
Spec Grav, UA: 1.025 (ref 1.010–1.025)
Urobilinogen, UA: 0.2 E.U./dL
pH, UA: 6 (ref 5.0–8.0)

## 2020-03-24 MED ORDER — CIPROFLOXACIN HCL 500 MG PO TABS
500.0000 mg | ORAL_TABLET | Freq: Two times a day (BID) | ORAL | 0 refills | Status: DC
Start: 2020-03-24 — End: 2020-11-03

## 2020-03-24 MED ORDER — HUMULIN 70/30 (70-30) 100 UNIT/ML ~~LOC~~ SUSP: SUBCUTANEOUS | 3 refills | Status: DC

## 2020-03-24 NOTE — Patient Instructions (Signed)
Start cipro for the uti.    We will call with the results of the urine culture.   Increase fluids.    Please call if there is no improvement in your symptoms.

## 2020-03-24 NOTE — Assessment & Plan Note (Signed)
Acute Symptoms c/w uti vs prostatitis Urine dip not consistent  With uti - send for culture -- given symptoms will start an antibiotic esp since on jardiance cipro 500 mg BID x 10 days Inc water intake He has held jardiance  He has not been compliant with diabetic diet and has sugar in his urine -- this likely increase risk of UTI - stressed compliance with low sugar diet Further evaluation depending on symptoms, and culture reslults

## 2020-03-25 NOTE — Addendum Note (Signed)
Addended by: Cresenciano Lick on: 03/25/2020 09:43 AM   Modules accepted: Orders

## 2020-03-26 ENCOUNTER — Telehealth: Payer: Self-pay | Admitting: *Deleted

## 2020-03-26 LAB — URINE CULTURE

## 2020-03-26 NOTE — Telephone Encounter (Signed)
No answer/busy

## 2020-03-31 ENCOUNTER — Other Ambulatory Visit: Payer: Self-pay | Admitting: Internal Medicine

## 2020-04-16 ENCOUNTER — Telehealth: Payer: Medicare Other

## 2020-04-16 NOTE — Chronic Care Management (AMB) (Deleted)
 Chronic Care Management Pharmacy  Name: Russell Trevino  MRN: [id number] DOB: 05/09/1951   Chief Complaint/ HPI  Russell Trevino,  68 y.o. , male presents for their Initial CCM visit with the clinical pharmacist via telephone due to COVID-19 Pandemic.  PCP : Burns, Stacy J, MD Patient Care Team: Burns, Stacy J, MD as PCP - General (Internal Medicine) Christopher, Bridgette, MD as PCP - Cardiology (Cardiology) Foltanski, Lindsey N, RPH as Pharmacist (Pharmacist)  Their chronic conditions include: Hypertension, Hyperlipidemia, Diabetes, Heart Failure, Coronary Artery Disease, GERD, Chronic Kidney Disease and BPH   Office Visits: 03/24/20 Dr Burns OV: UTI, on Jardiance, has not been compliant with diet. Rx's Cipro for UTI. Culture did not confirm infection.  11/20/19 Dr Burns OV: no meds   Consult Visit: 11/20/19 Dr Christopher (cardiology): discussed clopidogrel as he is 12 months out from NSTEMI, decided to continue DAPT indefinitely, ok to hold for procedures.  No Known Allergies  Medications: Outpatient Encounter Medications as of 04/17/2020  Medication Sig  . amLODipine (NORVASC) 5 MG tablet Take 1 tablet (5 mg total) by mouth daily.  . aspirin 81 MG tablet Take 81 mg by mouth daily at 12 noon.   . BD VEO INSULIN SYR ULTRAFINE 31G X 15/64" 1 ML MISC use as directed TO INJECT INSULIN  twice a day  . ciprofloxacin (CIPRO) 500 MG tablet Take 1 tablet (500 mg total) by mouth 2 (two) times daily.  . clopidogrel (PLAVIX) 75 MG tablet Take 1 tablet (75 mg total) by mouth daily.  . empagliflozin (JARDIANCE) 10 MG TABS tablet Take 10 mg by mouth daily before breakfast.  . ENTRESTO 49-51 MG TAKE 1 TABLET BY MOUTH TWICE DAILY  . ezetimibe (ZETIA) 10 MG tablet Take 1 tablet (10 mg total) by mouth every evening.  . finasteride (PROSCAR) 5 MG tablet TAKE 1 TABLET(5 MG) BY MOUTH DAILY  . furosemide (LASIX) 80 MG tablet Take 1 tablet (80 mg total) by mouth every other day. TAKE ON EMPTY  STOMACH  . insulin NPH-regular Human (HUMULIN 70/30) (70-30) 100 UNIT/ML injection 70 units with breakfast, and 30 units with supper  . isosorbide mononitrate (IMDUR) 120 MG 24 hr tablet Take 1 tablet (120 mg total) by mouth 2 (two) times daily.  . metoprolol succinate (TOPROL-XL) 50 MG 24 hr tablet Take 1 tablet (50 mg total) by mouth daily at 12 noon. Take with or immediately following a meal.  . nitroGLYCERIN (NITROSTAT) 0.4 MG SL tablet DISSOLVE 1 TABLET UNDER THE TONGUE EVERY 5 MINS A S NEEDED FOR CHEST PAIN CALL MD/911 IF TAKE 2 DOSES. MAX 3 DOSES PER DAY  . omeprazole (PRILOSEC) 40 MG capsule Take capsule by mouth every morning  . pantoprazole (PROTONIX) 40 MG tablet TAKE 1 TABLET(40 MG) BY MOUTH TWICE DAILY  . PEPCID 40 MG tablet Take 40 mg by mouth at bedtime.  . potassium chloride SA (K-DUR) 20 MEQ tablet Take 1 tablet (20 mEq total) by mouth daily as needed (when you take lasix).  . ranolazine (RANEXA) 500 MG 12 hr tablet Take 1 tablet (500 mg total) by mouth 2 (two) times daily.  . rosuvastatin (CRESTOR) 40 MG tablet Take 1 tablet (40 mg total) by mouth every evening.   No facility-administered encounter medications on file as of 04/17/2020.     Current Diagnosis/Assessment:    Goals Addressed   None     Heart Failure / Hypertension   Type: Combined Systolic and Diastolic  Last ejection fraction: 40-45%   NYHA Class: II (slight limitation of activity) AHA HF Stage: C (Heart disease and symptoms present)  BP goal is:  <130/80 BP Readings from Last 3 Encounters:  03/24/20 126/70  11/20/19 116/60  11/20/19 136/70   Kidney Function Lab Results  Component Value Date/Time   CREATININE 2.25 (H) 11/20/2019 10:33 AM   CREATININE 1.78 (H) 08/30/2019 03:37 PM   GFR 29.03 (L) 11/20/2019 10:33 AM   GFRNONAA 38 (L) 08/30/2019 03:37 PM   GFRAA 44 (L) 08/30/2019 03:37 PM   K 3.9 11/20/2019 10:33 AM   K 5.2 08/30/2019 03:37 PM   Patient has failed these meds in past:  *** Patient is currently {CHL Controlled/Uncontrolled:2109141014} on the following medications:  . Entresto 49-51 mg BID . Amlodipine 5 mg daily . Furosemide 80 mg QOD . Metoprolol succinate 50 mg daily . Isosorbide MN 120 mg BID . Jardiance 10 mg daily . Potassium chloride 20 mEq with furosemide  We discussed {CHL HP Upstream Pharmacy discussion:2109141007}  Plan  Continue {CHL HP Upstream Pharmacy Plans:2109141003}    Hyperlipidemia / CAD   LDL goal < 70 s/p CABG 2006 (LIMA-LAD, Left radial-OM and D1, SVG-PDA): [place] patent in 2009 10/2018: NSTEMI 10/2018: Cardiac cath 10/2018 echo: EF 40-45%, LV severely dilated, LV diastolic dysfunction, LAE, moderate MR, mild ascending aorta dilation  Lipid Panel     Component Value Date/Time   CHOL 142 11/20/2019 1033   TRIG 193.0 (H) 11/20/2019 1033   HDL 46.00 11/20/2019 1033   LDLCALC 58 11/20/2019 1033   LDLDIRECT 155.0 07/13/2017 1033    Hepatic Function Latest Ref Rng & Units 11/20/2019 05/13/2019 02/11/2019  Total Protein 6.0 - 8.3 g/dL 7.0 7.6 7.7  Albumin 3.5 - 5.2 g/dL 3.8 4.2 4.0  AST 0 - 37 U/L 19 13 16  ALT 0 - 53 U/L 16 19 16  Alk Phosphatase 39 - 117 U/L 65 83 73  Total Bilirubin 0.2 - 1.2 mg/dL 1.4(H) 1.5(H) 1.1  Bilirubin, Direct 0.0 - 0.2 mg/dL - - -     The ASCVD Risk score (Goff DC Jr., et al., 2013) failed to calculate for the following reasons:   The patient has a prior MI or stroke diagnosis   Patient has failed these meds in past: *** Patient is currently {CHL Controlled/Uncontrolled:2109141014} on the following medications:  . Rosuvastatin 40 mg daily . Ezetimibe 10 mg daily . Ranolazine 500 mg BID . Nitroglycerin 0.4 mg SL prn . Aspirin 81 mg daily . Clopidogrel 75 mg daily  We discussed:  {CHL HP Upstream Pharmacy discussion:2109141007}  Plan  Continue {CHL HP Upstream Pharmacy Plans:2109141003}  Diabetes   A1c goal <7%  Recent Relevant Labs: Lab Results  Component Value Date/Time    HGBA1C 7.8 (H) 11/20/2019 10:33 AM   HGBA1C 8.2 (H) 05/13/2019 02:02 PM   GFR 29.03 (L) 11/20/2019 10:33 AM   GFR 38.60 (L) 05/13/2019 02:02 PM   MICROALBUR 266.9 (H) 05/26/2016 10:23 AM    Last diabetic Eye exam:  Lab Results  Component Value Date/Time   HMDIABEYEEXA Retinopathy (A) 11/21/2019 12:00 AM    Last diabetic Foot exam: No results found for: HMDIABFOOTEX   Checking BG: {CHL HP Blood Glucose Monitoring Frequency:2109141001}  Recent FBG Readings: *** Recent pre-meal BG readings: *** Recent 2hr PP BG readings:  *** Recent HS BG readings: ***  Patient has failed these meds in past: Humalog 75/25, metformin, Januvia, Trulicity (cost) Patient is currently {CHL Controlled/Uncontrolled:2109141014} on the following medications: . Humulin 70/30: 70 units AM,   30 units PM . Jardiance 10 mg daily  We discussed: {CHL HP Upstream Pharmacy discussion:[phone]}  Plan  Continue {CHL HP Upstream Pharmacy Plans:[phone]}  GERD   Patient has failed these meds in past: [place] Patient is currently {CHL Controlled/Uncontrolled:[phone]} on the following medications:  . Omeprazole 40 mg  . Pantoprazole 40 mg BID . Famotidine 40 mg HS  We discussed:  ***  Plan  Continue {CHL HP Upstream Pharmacy Plans:[phone]}  BPH   PSA  Date Value Ref Range Status  05/26/2016 4.43 (H) 0.10 - 4.00 ng/mL Final    Patient has failed these meds in past: dutasteride  Patient is currently {CHL Controlled/Uncontrolled:[phone]} on the following medications:  . Finasteride 5 mg daily  We discussed:  ***  Plan  Continue {CHL HP Upstream Pharmacy Plans:[phone]}  Medication Management   Pt uses [facility] for all medications Uses pill box? {Yes or If no, why not?:20788} Pt endorses ***% compliance **most refills not since Feb/March  We discussed: ***  Plan  {US Pharmacy RKYH:06237}    Follow up: *** month phone visit  ***

## 2020-04-16 NOTE — Chronic Care Management (AMB) (Deleted)
Chronic Care Management Pharmacy  Name: Russell Trevino  MRN: [id number] DOB: [birth year]   Chief Complaint/ HPI  [name],  69 y.o. , male presents for their Initial CCM visit with the clinical pharmacist via telephone due to COVID-19 Pandemic.  PCP : Binnie Rail, MD Patient Care Team: Binnie Rail, MD as PCP - General (Internal Medicine) Buford Dresser, MD as PCP - Cardiology (Cardiology) Charlton Haws, [facility] as Pharmacist (Pharmacist)  Their chronic conditions include: Hypertension, Hyperlipidemia, Diabetes, Heart Failure, Coronary Artery Disease, GERD, Chronic Kidney Disease and BPH   Office Visits: 03/24/20 Dr Quay Burow OV: UTI, on Jardiance, has not been compliant with diet. Rx's Cipro for UTI. Culture did not confirm infection.  11/20/19 Dr Quay Burow OV:   Consult Visit: 11/20/19 Dr Harrell Gave (cardiology): discussed clopidogrel as he is 12 months out from NSTEMI, decided to continue DAPT indefinitely, ok to hold for procedures.  No Known Allergies  Medications: Outpatient Encounter Medications as of 04/16/2020  Medication Sig  . amLODipine (NORVASC) 5 MG tablet Take 1 tablet (5 mg total) by mouth daily.  Marland Kitchen aspirin 81 MG tablet Take 81 mg by mouth daily at 12 noon.   . BD VEO INSULIN SYR ULTRAFINE 31G X 15/64" 1 ML MISC use as directed TO INJECT INSULIN  twice a day  . ciprofloxacin (CIPRO) 500 MG tablet Take 1 tablet (500 mg total) by mouth 2 (two) times daily.  . clopidogrel (PLAVIX) 75 MG tablet Take 1 tablet (75 mg total) by mouth daily.  . empagliflozin (JARDIANCE) 10 MG TABS tablet Take 10 mg by mouth daily before breakfast.  . ENTRESTO 49-51 MG TAKE 1 TABLET BY MOUTH TWICE DAILY  . ezetimibe (ZETIA) 10 MG tablet Take 1 tablet (10 mg total) by mouth every evening.  . finasteride (PROSCAR) 5 MG tablet TAKE 1 TABLET(5 MG) BY MOUTH DAILY  . furosemide (LASIX) 80 MG tablet Take 1 tablet (80 mg total) by mouth every other day. TAKE ON EMPTY STOMACH  .  insulin NPH-regular Human (HUMULIN 70/30) (70-30) 100 UNIT/ML injection 70 units with breakfast, and 30 units with supper  . isosorbide mononitrate (IMDUR) 120 MG 24 hr tablet Take 1 tablet (120 mg total) by mouth 2 (two) times daily.  . metoprolol succinate (TOPROL-XL) 50 MG 24 hr tablet Take 1 tablet (50 mg total) by mouth daily at 12 noon. Take with or immediately following a meal.  . nitroGLYCERIN (NITROSTAT) 0.4 MG SL tablet DISSOLVE 1 TABLET UNDER THE TONGUE EVERY 5 MINS A S NEEDED FOR CHEST PAIN CALL MD/911 IF TAKE 2 DOSES. MAX 3 DOSES PER DAY  . omeprazole (PRILOSEC) 40 MG capsule Take capsule by mouth every morning  . pantoprazole (PROTONIX) 40 MG tablet TAKE 1 TABLET(40 MG) BY MOUTH TWICE DAILY  . PEPCID 40 MG tablet Take 40 mg by mouth at bedtime.  . potassium chloride SA (K-DUR) 20 MEQ tablet Take 1 tablet (20 mEq total) by mouth daily as needed (when you take lasix).  . ranolazine (RANEXA) 500 MG 12 hr tablet Take 1 tablet (500 mg total) by mouth 2 (two) times daily.  . rosuvastatin (CRESTOR) 40 MG tablet Take 1 tablet (40 mg total) by mouth every evening.   No facility-administered encounter medications on file as of 04/16/2020.     Current Diagnosis/Assessment:    Goals Addressed   None     Heart Failure / Hypertension   Type: Combined Systolic and Diastolic  Last ejection fraction: 40-45% NYHA Class:  II (slight limitation of activity) AHA HF Stage: C (Heart disease and symptoms present)  BP goal is:  <130/80 BP Readings from Last 3 Encounters:  03/24/20 126/70  11/20/19 116/60  11/20/19 136/70   Kidney Function Lab Results  Component Value Date/Time   CREATININE 2.25 (H) 11/20/2019 10:33 AM   CREATININE 1.78 (H) 08/30/2019 03:37 PM   GFR 29.03 (L) 11/20/2019 10:33 AM   GFRNONAA 38 (L) 08/30/2019 03:37 PM   GFRAA 44 (L) 08/30/2019 03:37 PM   K 3.9 11/20/2019 10:33 AM   K 5.2 08/30/2019 03:37 PM   Patient has failed these meds in past: *** Patient is  currently {CHL Controlled/[phone]} on the following medications:  . Entresto 49-51 mg BID . Amlodipine 5 mg daily . Furosemide 80 mg QOD . Metoprolol succinate 50 mg daily . Isosorbide MN 120 mg BID . Jardiance 10 mg daily . Potassium chloride 20 mEq with furosemide  We discussed {CHL HP Upstream Pharmacy discussion:[phone]}  Plan  Continue {CHL HP Upstream Pharmacy Plans:[phone]}    Hyperlipidemia / CAD   LDL goal < 70 s/p CABG 2006 (LIMA-LAD, Left radial-OM and D1, SVG-PDA): [place] patent in 2009 10/2018: NSTEMI 10/2018: Cardiac cath 10/2018 echo: EF 40-45%, LV severely dilated, LV diastolic dysfunction, LAE, moderate MR, mild ascending aorta dilation  Lipid Panel     Component Value Date/Time   CHOL 142 11/20/2019 1033   TRIG 193.0 (H) 11/20/2019 1033   HDL 46.00 11/20/2019 1033   LDLCALC 58 11/20/2019 1033   LDLDIRECT 155.0 07/13/2017 1033    Hepatic Function Latest Ref Rng & Units 11/20/2019 05/13/2019 02/11/2019  Total Protein 6.0 - 8.3 g/dL 7.0 7.6 7.7  Albumin 3.5 - 5.2 g/dL 3.8 4.2 4.0  AST 0 - 37 U/L _0 ALT 0 - 53 U/L _1 Alk Phosphatase 39 - 117 U/L 65 83 73  Total Bilirubin 0.2 - 1.2 mg/dL 1.4(H) 1.5(H) 1.1  Bilirubin, Direct 0.0 - 0.2 mg/dL - - -     The ASCVD Risk score Mikey Bussing DC Jr., et al., 2013) failed to calculate for the following reasons:   The patient has a prior MI or stroke diagnosis   Patient has failed these meds in past: *** Patient is currently {CHL Controlled/[phone]} on the following medications:  . Rosuvastatin 40 mg daily . Ezetimibe 10 mg daily . Ranolazine 500 mg BID . Nitroglycerin 0.4 mg SL prn . Aspirin 81 mg daily . Clopidogrel 75 mg daily  We discussed:  {CHL HP Upstream Pharmacy discussion:[phone]}  Plan  Continue {CHL HP Upstream Pharmacy Plans:[phone]}  Diabetes   A1c goal <7%  Recent Relevant Labs: Lab Results  Component Value Date/Time   HGBA1C 7.8 (H)  11/20/2019 10:33 AM   HGBA1C 8.2 (H) 05/13/2019 02:02 PM   GFR 29.03 (L) 11/20/2019 10:33 AM   GFR 38.60 (L) 05/13/2019 02:02 PM   MICROALBUR 266.9 (H) 05/26/2016 10:23 AM    Last diabetic Eye exam:  Lab Results  Component Value Date/Time   HMDIABEYEEXA Retinopathy (A) 11/21/2019 12:00 AM    Last diabetic Foot exam: No results found for: HMDIABFOOTEX   Checking BG: {CHL HP Blood Glucose Monitoring Frequency:[phone]}  Recent FBG Readings: *** Recent pre-meal BG readings: *** Recent 2hr PP BG readings:  *** Recent HS BG readings: ***  Patient has failed these meds in past: Humalog 75/25, metformin, Januvia, Trulicity (cost) Patient is currently {CHL Controlled/[phone]} on the following medications: . Humulin 70/30: 70 units AM, 30 units  PM . Jardiance 10 mg daily  We discussed: {CHL HP Upstream Pharmacy [phone]}  Plan  Continue {CHL HP Upstream Pharmacy Plans:[phone]}  GERD   Patient has failed these meds in past: [place] Patient is currently {CHL Controlled/Uncontrolled:[phone]} on the following medications:  . Omeprazole 40 mg  . Pantoprazole 40 mg BID . Famotidine 40 mg HS  We discussed:  ***  Plan  Continue {CHL HP Upstream Pharmacy Plans:[phone]}  BPH   PSA  Date Value Ref Range Status  05/26/2016 4.43 (H) 0.10 - 4.00 ng/mL Final    Patient has failed these meds in past: dutasteride  Patient is currently {CHL Controlled/Uncontrolled:[phone]} on the following medications:  . Finasteride 5 mg daily  We discussed:  ***  Plan  Continue {CHL HP Upstream Pharmacy JSEGB:[id number]}  Medication Management   Pt uses *** pharmacy for all medications Uses pill box? {Yes or If no, why not?:20788} Pt endorses ***% compliance  We discussed: ***  Plan  {US Pharmacy XTGG:26948}    Follow up: *** month phone visit  ***

## 2020-04-17 ENCOUNTER — Telehealth: Payer: Medicare Other

## 2020-04-22 LAB — COMPREHENSIVE METABOLIC PANEL
ALT: 26 U/L (ref 0–41)
AST: 22 U/L (ref 0–40)
Albumin/Globulin Ratio: 1.5 mmol/L (ref 1.00–2.70)
Albumin: 4 g/dL (ref 3.5–5.2)
Alk Phosphatase: 87 U/L (ref 40–130)
Anion Gap: 10 mmol/L (ref 2–17)
BUN: 29 mg/dL — ABNORMAL HIGH (ref 8–23)
CO2: 26 mmol/L (ref 22–29)
Calcium: 9 mg/dL (ref 8.8–10.2)
Chloride: 99 mmol/L (ref 98–107)
Creatinine: 1.6 mg/dL — ABNORMAL HIGH (ref 0.7–1.3)
GFR African American: 50 mL/min/{1.73_m2} — ABNORMAL LOW (ref >=90)
GFR Non-African American: 43 mL/min/{1.73_m2} — ABNORMAL LOW (ref >=90)
Globulin: 3 g/dL (ref 1.9–4.4)
Glucose: 300 mg/dL — ABNORMAL HIGH (ref 70–99)
OSMOLALITY CALCULATED: 287 mOsm/kg (ref 270–287)
Potassium: 4.8 mmol/L (ref 3.5–5.3)
Sodium: 135 mmol/L (ref 135–145)
Total Bilirubin: 1.2 mg/dL (ref 0.00–1.20)
Total Protein: 6.7 g/dL (ref 6.4–8.3)

## 2020-04-22 LAB — HEMOGLOBIN A1C
Est. Avg. Glucose, WB: 217
Est. Avg. Glucose-calculated: 250
Hemoglobin A1C: 9.2 % — ABNORMAL HIGH (ref 4.0–6.0)

## 2020-04-22 LAB — LIPID PANEL
Chol/HDL Ratio: 3 (ref 0.0–4.4)
Cholesterol: 169 mg/dL (ref 100–200)
HDL: 56 mg/dL (ref >=40)
LDL Cholesterol: 75 mg/dL (ref 0.0–100.0)
LDL/HDL Ratio: 1.3
Triglycerides: 190 mg/dL — ABNORMAL HIGH (ref 0–149)
VLDL: 38 mg/dL (ref 5.0–40.0)

## 2020-04-22 LAB — CBC
Hematocrit: 38 % (ref 38.0–52.0)
Hemoglobin: 13 g/dL (ref 13.0–17.3)
MCH: 30.6 pg (ref 27.0–34.5)
MCHC: 34.2 g/dL (ref 32.0–36.0)
MCV: 89.4 fL (ref 84.0–100.0)
MPV: 11.3 fL (ref 7.2–13.2)
NRBC Absolute: 0 10*3/uL (ref 0.000–0.012)
NRBC Automated: 0 % (ref 0.0–0.2)
Platelets: 164 10*3/uL (ref 140–440)
RBC: 4.25 x10e6/mcL (ref 4.00–5.60)
RDW: 13.5 % (ref 11.0–16.0)
WBC: 9.4 10*3/uL (ref 3.8–10.6)

## 2020-04-23 ENCOUNTER — Encounter: Payer: Self-pay | Admitting: General Practice

## 2020-05-06 ENCOUNTER — Telehealth: Payer: Self-pay | Admitting: Pharmacist

## 2020-05-06 NOTE — Progress Notes (Signed)
Chronic Care Management Pharmacy Assistant   Name: Russell Trevino  MRN: [id number] DOB: 1951-01-16  Reason for Encounter: General Adherence Call   Patient Questions:  1.  Have you seen any other providers since your last visit? Yes,   2.  Any changes in your medicines or health? No    PCP : Binnie Rail, MD  Allergies:  No Known Allergies  Medications: Outpatient Encounter Medications as of 05/06/2020  Medication Sig  . amLODipine (NORVASC) 5 MG tablet Take 1 tablet (5 mg total) by mouth daily.  Marland Kitchen aspirin 81 MG tablet Take 81 mg by mouth daily at 12 noon.   . BD VEO INSULIN SYR ULTRAFINE 31G X 15/64" 1 ML MISC use as directed TO INJECT INSULIN  twice a day  . ciprofloxacin (CIPRO) 500 MG tablet Take 1 tablet (500 mg total) by mouth 2 (two) times daily.  . clopidogrel (PLAVIX) 75 MG tablet Take 1 tablet (75 mg total) by mouth daily.  . empagliflozin (JARDIANCE) 10 MG TABS tablet Take 10 mg by mouth daily before breakfast.  . ENTRESTO 49-51 MG TAKE 1 TABLET BY MOUTH TWICE DAILY  . ezetimibe (ZETIA) 10 MG tablet Take 1 tablet (10 mg total) by mouth every evening.  . finasteride (PROSCAR) 5 MG tablet TAKE 1 TABLET(5 MG) BY MOUTH DAILY  . furosemide (LASIX) 80 MG tablet Take 1 tablet (80 mg total) by mouth every other day. TAKE ON EMPTY STOMACH  . insulin NPH-regular Human (HUMULIN 70/30) (70-30) 100 UNIT/ML injection 70 units with breakfast, and 30 units with supper  . isosorbide mononitrate (IMDUR) 120 MG 24 hr tablet Take 1 tablet (120 mg total) by mouth 2 (two) times daily.  . metoprolol succinate (TOPROL-XL) 50 MG 24 hr tablet Take 1 tablet (50 mg total) by mouth daily at 12 noon. Take with or immediately following a meal.  . nitroGLYCERIN (NITROSTAT) 0.4 MG SL tablet DISSOLVE 1 TABLET UNDER THE TONGUE EVERY 5 MINS A S NEEDED FOR CHEST PAIN CALL MD/911 IF TAKE 2 DOSES. MAX 3 DOSES PER DAY  . omeprazole (PRILOSEC) 40 MG capsule Take capsule by mouth every morning  .  pantoprazole (PROTONIX) 40 MG tablet TAKE 1 TABLET(40 MG) BY MOUTH TWICE DAILY  . PEPCID 40 MG tablet Take 40 mg by mouth at bedtime.  . potassium chloride SA (K-DUR) 20 MEQ tablet Take 1 tablet (20 mEq total) by mouth daily as needed (when you take lasix).  . ranolazine (RANEXA) 500 MG 12 hr tablet Take 1 tablet (500 mg total) by mouth 2 (two) times daily.  . rosuvastatin (CRESTOR) 40 MG tablet Take 1 tablet (40 mg total) by mouth every evening.   No facility-administered encounter medications on file as of 05/06/2020.    Current Diagnosis: Patient Active Problem List   Diagnosis Date Noted  . Acute cystitis without hematuria 03/24/2020  . BPH (benign prostatic hyperplasia) 11/20/2019  . CKD (chronic kidney disease) stage 4, GFR 15-29 ml/min (HCC) 08/04/2019  . Coronary artery disease involving native coronary artery of native heart with angina pectoris ([place]) 03/04/2019  . CKD stage 3 due to type 2 diabetes mellitus ([place]) 01/28/2019  . Ischemic cardiomyopathy 11/05/2018  . Chronic combined systolic and diastolic heart failure ([place]) 11/05/2018  . Diabetes mellitus ([place]) 08/26/2017  . GERD (gastroesophageal reflux disease) 07/13/2017  . Thoracic back pain 06/21/2016  . Hx of CABG 05/26/2016  . Prostate cancer ([place]) 05/26/2016  . Essential hypertension, benign 05/26/2016  . Mixed hyperlipidemia  05/26/2016  . Coronary artery disease due to calcified coronary lesion 03/25/2013    Goals Addressed   None     Follow-Up:  Pharmacist Review   Per clinical pharmacist the patient missed his appointment. I called the patient for an general adherence call. The patient stated that he does not have any issues or problems with his medications he feels that he is managing his symptoms. The patient stated that there are no new medication issues he would like to discuss right now with the pharmacist. But he would like to reschedule his appointment when he is available.    Russell Trevino, [place] Pharmacist Assistant  [phone]

## 2020-05-25 LAB — HM DIABETES EYE EXAM

## 2020-05-26 ENCOUNTER — Encounter: Payer: Self-pay | Admitting: Internal Medicine

## 2020-06-15 ENCOUNTER — Other Ambulatory Visit: Payer: Self-pay | Admitting: Internal Medicine

## 2020-07-02 DIAGNOSIS — M51369 Other intervertebral disc degeneration, lumbar region without mention of lumbar back pain or lower extremity pain: Secondary | ICD-10-CM | POA: Insufficient documentation

## 2020-10-14 ENCOUNTER — Other Ambulatory Visit: Payer: Self-pay | Admitting: Internal Medicine

## 2020-10-22 ENCOUNTER — Telehealth: Payer: Self-pay | Admitting: Internal Medicine

## 2020-10-22 ENCOUNTER — Other Ambulatory Visit: Payer: Self-pay

## 2020-10-22 DIAGNOSIS — I25119 Atherosclerotic heart disease of native coronary artery with unspecified angina pectoris: Secondary | ICD-10-CM

## 2020-10-22 MED ORDER — NITROGLYCERIN 0.4 MG SL SUBL: SUBLINGUAL_TABLET | SUBLINGUAL | 0 refills | Status: DC

## 2020-10-22 NOTE — Telephone Encounter (Signed)
Sent in today for patient. 

## 2020-10-22 NOTE — Telephone Encounter (Signed)
Patient requesting refill for nitroGLYCERIN (NITROSTAT) 0.4 MG SL tablet Pharmacy [facility] [place], [place]

## 2020-11-02 ENCOUNTER — Other Ambulatory Visit: Payer: Self-pay

## 2020-11-03 ENCOUNTER — Telehealth: Payer: Self-pay | Admitting: Internal Medicine

## 2020-11-03 ENCOUNTER — Encounter: Payer: Self-pay | Admitting: Internal Medicine

## 2020-11-03 ENCOUNTER — Other Ambulatory Visit: Payer: Self-pay

## 2020-11-03 ENCOUNTER — Ambulatory Visit (INDEPENDENT_AMBULATORY_CARE_PROVIDER_SITE_OTHER): Payer: Medicare Other | Admitting: Internal Medicine

## 2020-11-03 VITALS — BP 126/74 | HR 75 | Temp 98.5°F | Wt 238.0 lb

## 2020-11-03 DIAGNOSIS — I5042 Chronic combined systolic (congestive) and diastolic (congestive) heart failure: Secondary | ICD-10-CM | POA: Diagnosis not present

## 2020-11-03 DIAGNOSIS — K3184 Gastroparesis: Secondary | ICD-10-CM

## 2020-11-03 DIAGNOSIS — E1122 Type 2 diabetes mellitus with diabetic chronic kidney disease: Secondary | ICD-10-CM | POA: Diagnosis not present

## 2020-11-03 DIAGNOSIS — Z23 Encounter for immunization: Secondary | ICD-10-CM | POA: Diagnosis not present

## 2020-11-03 DIAGNOSIS — N1832 Chronic kidney disease, stage 3b: Secondary | ICD-10-CM

## 2020-11-03 DIAGNOSIS — I25119 Atherosclerotic heart disease of native coronary artery with unspecified angina pectoris: Secondary | ICD-10-CM | POA: Diagnosis not present

## 2020-11-03 DIAGNOSIS — I10 Essential (primary) hypertension: Secondary | ICD-10-CM

## 2020-11-03 DIAGNOSIS — G250 Essential tremor: Secondary | ICD-10-CM

## 2020-11-03 DIAGNOSIS — K219 Gastro-esophageal reflux disease without esophagitis: Secondary | ICD-10-CM

## 2020-11-03 DIAGNOSIS — E782 Mixed hyperlipidemia: Secondary | ICD-10-CM

## 2020-11-03 DIAGNOSIS — N400 Enlarged prostate without lower urinary tract symptoms: Secondary | ICD-10-CM

## 2020-11-03 DIAGNOSIS — E1143 Type 2 diabetes mellitus with diabetic autonomic (poly)neuropathy: Secondary | ICD-10-CM

## 2020-11-03 DIAGNOSIS — Z794 Long term (current) use of insulin: Secondary | ICD-10-CM

## 2020-11-03 DIAGNOSIS — N183 Chronic kidney disease, stage 3 unspecified: Secondary | ICD-10-CM

## 2020-11-03 LAB — LIPID PANEL
Cholesterol: 176 mg/dL (ref 0–200)
HDL: 47.8 mg/dL (ref >39.00)
Total CHOL/HDL Ratio: 4
Triglycerides: 416 mg/dL — ABNORMAL HIGH (ref 0.0–149.0)

## 2020-11-03 LAB — COMPREHENSIVE METABOLIC PANEL
ALT: 17 U/L (ref 0–53)
AST: 15 U/L (ref 0–37)
Albumin: 4 g/dL (ref 3.5–5.2)
Alkaline Phosphatase: 77 U/L (ref 39–117)
BUN: 37 mg/dL — ABNORMAL HIGH (ref 6–23)
CO2: 22 mEq/L (ref 19–32)
Calcium: 9.2 mg/dL (ref 8.4–10.5)
Chloride: 96 mEq/L (ref 96–112)
Creatinine, Ser: 1.83 mg/dL — ABNORMAL HIGH (ref 0.40–1.50)
GFR: 36.97 mL/min — ABNORMAL LOW (ref >60.00)
Glucose, Bld: 499 mg/dL — ABNORMAL HIGH (ref 70–99)
Potassium: 4.5 mEq/L (ref 3.5–5.1)
Sodium: 129 mEq/L — ABNORMAL LOW (ref 135–145)
Total Bilirubin: 1.2 mg/dL (ref 0.2–1.2)
Total Protein: 6.8 g/dL (ref 6.0–8.3)

## 2020-11-03 LAB — CBC WITH DIFFERENTIAL/PLATELET
Basophils Absolute: 0.1 10*3/uL (ref 0.0–0.1)
Basophils Relative: 1.1 % (ref 0.0–3.0)
Eosinophils Absolute: 0.2 10*3/uL (ref 0.0–0.7)
Eosinophils Relative: 2.3 % (ref 0.0–5.0)
HCT: 37.4 % — ABNORMAL LOW (ref 39.0–52.0)
Hemoglobin: 12.8 g/dL — ABNORMAL LOW (ref 13.0–17.0)
Lymphocytes Relative: 14.9 % (ref 12.0–46.0)
Lymphs Abs: 1.2 10*3/uL (ref 0.7–4.0)
MCHC: 34.2 g/dL (ref 30.0–36.0)
MCV: 90.2 fl (ref 78.0–100.0)
Monocytes Absolute: 0.5 10*3/uL (ref 0.1–1.0)
Monocytes Relative: 6.7 % (ref 3.0–12.0)
Neutro Abs: 6.1 10*3/uL (ref 1.4–7.7)
Neutrophils Relative %: 75 % (ref 43.0–77.0)
Platelets: 164 10*3/uL (ref 150.0–400.0)
RBC: 4.15 Mil/uL — ABNORMAL LOW (ref 4.22–5.81)
RDW: 13.6 % (ref 11.5–15.5)
WBC: 8.1 10*3/uL (ref 4.0–10.5)

## 2020-11-03 LAB — LDL CHOLESTEROL, DIRECT: Direct LDL: 80 mg/dL

## 2020-11-03 NOTE — Assessment & Plan Note (Addendum)
Chronic Following with GI continue reglan prior to meals Taking protonix 40 mg bid ac, pepcid 40 mg daily He knows he needs to eat smaller meals and not eat too late, but he is not really compliant with this and will have symptoms because of it

## 2020-11-03 NOTE — Telephone Encounter (Signed)
Critical glucose of 503 rec go [place] or [facility] for treatment to get this controlled and f/u with PCP  If refusing to go can take 70/30 40 units with supper instead of 30 tonight and call in the am PCP office  Dr. Olivia Mackie McLean-Scocuzza

## 2020-11-03 NOTE — Assessment & Plan Note (Signed)
Chronic A1c today Not checking sugars regularly Taking his medications as prescribed Will adjust medications if needed

## 2020-11-03 NOTE — Assessment & Plan Note (Signed)
Chronic Check lipid panel  Continue Crestor 40 mg Regular exercise and healthy diet encouraged

## 2020-11-03 NOTE — Assessment & Plan Note (Signed)
Chronic CMP Not taking any NSAIDs

## 2020-11-03 NOTE — Patient Instructions (Addendum)
  Blood work was ordered.     Flu immunization administered today.     Medications changes include :   none    Please followup in 6 months  

## 2020-11-03 NOTE — Assessment & Plan Note (Addendum)
Chronic gerd not ideally controlled - gerd 2/ week depending on how he eats-he knows he needs to eat smaller meals and not eat late Taking protonix 40 mg BIDac, pepcid 40 mg HS, reglan 10 mg prior to meals

## 2020-11-03 NOTE — Assessment & Plan Note (Signed)
Chronic Euvolemic on exam Following with cardiology-will make follow-up appointment Continue current medications CMP

## 2020-11-03 NOTE — Assessment & Plan Note (Addendum)
Chronic Getting worse Mom had benign essential tremor Would consider medication in the future, but tremor tolerable now He will let me know if it gets worse and he wants to go on medication at which time I would refer him to neurology to make sure he was a good candidate for primidone

## 2020-11-03 NOTE — Progress Notes (Signed)
Subjective:    Patient ID: Russell Trevino, male    DOB: [birth year], 70 y.o.   MRN: [id number]  HPI The patient is here for follow up of their chronic medical problems, including DM, CKD, CAD, combined HF, htn, hyperlipidemia, gerd, BPH  He still has a lot of GERD. He has gastroparesis.   He takes his meds and tries to follow a gerd diet.   He is walking some.  His sugars have been good.   Medications and allergies reviewed with patient and updated if appropriate.  Patient Active Problem List   Diagnosis Date Noted  . Gastroparesis due to DM ([place]) 11/03/2020  . Acute cystitis without hematuria 03/24/2020  . BPH (benign prostatic hyperplasia) 11/20/2019  . CKD (chronic kidney disease) stage 4, GFR 15-29 ml/min (HCC) 08/04/2019  . Coronary artery disease involving native coronary artery of native heart with angina pectoris ([place]) 03/04/2019  . CKD stage 3 due to type 2 diabetes mellitus ([place]) 01/28/2019  . Ischemic cardiomyopathy 11/05/2018  . Chronic combined systolic and diastolic heart failure ([place]) 11/05/2018  . Diabetes mellitus ([place]) 08/26/2017  . GERD (gastroesophageal reflux disease) 07/13/2017  . Thoracic back pain 06/21/2016  . Hx of CABG 05/26/2016  . Prostate cancer ([place]) 05/26/2016  . Essential hypertension, benign 05/26/2016  . Mixed hyperlipidemia 05/26/2016  . Coronary artery disease due to calcified coronary lesion 03/25/2013    Current Outpatient Medications on File Prior to Visit  Medication Sig Dispense Refill  . aspirin 81 MG tablet Take 81 mg by mouth daily at 12 noon.     . BD VEO INSULIN SYR ULTRAFINE 31G X 15/64" 1 ML MISC use as directed TO INJECT INSULIN  twice a day 10 each 0  . clopidogrel (PLAVIX) 75 MG tablet Take 1 tablet (75 mg total) by mouth daily. 90 tablet 3  . empagliflozin (JARDIANCE) 10 MG TABS tablet Take 10 mg by mouth daily before breakfast. 30 tablet 5  . ENTRESTO 49-51 MG TAKE 1 TABLET BY MOUTH TWICE DAILY 60 tablet 11  .  ezetimibe (ZETIA) 10 MG tablet Take 1 tablet (10 mg total) by mouth every evening. 90 tablet 1  . finasteride (PROSCAR) 5 MG tablet TAKE 1 TABLET(5 MG) BY MOUTH DAILY 90 tablet 1  . furosemide (LASIX) 80 MG tablet Take 1 tablet (80 mg total) by mouth every other day. TAKE ON EMPTY STOMACH 90 tablet 3  . insulin NPH-regular Human (HUMULIN 70/30) (70-30) 100 UNIT/ML injection 70 units with breakfast, and 30 units with supper 50 mL 3  . isosorbide mononitrate (IMDUR) 120 MG 24 hr tablet Take 1 tablet (120 mg total) by mouth 2 (two) times daily. 180 tablet 3  . metoCLOPramide (REGLAN) 10 MG tablet TAKE 1 TABLET BY MOUTH BEFORE MEALS AND AT BEDTIME    . metoprolol succinate (TOPROL-XL) 50 MG 24 hr tablet Take 1 tablet (50 mg total) by mouth daily at 12 noon. Take with or immediately following a meal. 90 tablet 3  . nitroGLYCERIN (NITROSTAT) 0.4 MG SL tablet DISSOLVE 1 TABLET UNDER THE TONGUE EVERY 5 MINS A S NEEDED FOR CHEST PAIN CALL MD/911 IF TAKE 2 DOSES. MAX 3 DOSES PER DAY 25 tablet 0  . pantoprazole (PROTONIX) 40 MG tablet TAKE 1 TABLET(40 MG) BY MOUTH TWICE DAILY 90 tablet 3  . PEPCID 40 MG tablet Take 40 mg by mouth at bedtime.    . potassium chloride SA (K-DUR) 20 MEQ tablet Take 1 tablet (20 mEq total) by  mouth daily as needed (when you take lasix). 90 tablet 1  . ranolazine (RANEXA) 500 MG 12 hr tablet Take 1 tablet (500 mg total) by mouth 2 (two) times daily. 180 tablet 3  . rosuvastatin (CRESTOR) 40 MG tablet TAKE 1 TABLET(40 MG) BY MOUTH DAILY 90 tablet 3  . amLODipine (NORVASC) 5 MG tablet Take 1 tablet (5 mg total) by mouth daily. 90 tablet 3   No current facility-administered medications on file prior to visit.    Past Medical History:  Diagnosis Date  . BPH (benign prostatic hypertrophy)   . Cardiomyopathy, ischemic   . Coronary artery disease    at [facility], [place] 2009 (GRAFTS PATENT), cath 05/2018 w/ SVG-PDA 100%>>med rx  . Diabetes mellitus, type 2 ([place])   . Frequency of  urination   . GERD (gastroesophageal reflux disease)   . Gross hematuria   . History of CHF (congestive heart failure) 2007  . History of kidney stones   . History of myocardial infarction 2006, 2019   2006>>CABG, 2019 cath w/ med rx  . History of urinary retention   . Hyperlipidemia   . Nocturia   . Renal calculi    BILATERAL PER CT SCAN--  NON-OBSTRUCTIVE  . S/P CABG x 4 2006    Past Surgical History:  Procedure Laterality Date  . CARDIAC CATHETERIZATION  01/17/2008   GRAFTS PATENT / EF 40%,  DR SANTOS ([facility])  . CORONARY ARTERY BYPASS GRAFT  04/2005   LIMA-LAD, L radial-OM-D1 and SVG-PDA  . CYSTO / RIGHT URETERAL STENT PLACEMENT  10-08-1999  . CYSTO/  REMOVAL BLADDER STONES  01-14-2010  . CYSTOSCOPY  10/16/2012   Procedure: CYSTOSCOPY;  Surgeon: Malka So, MD;  Location: [facility];  Service: Urology;  Laterality: N/A;  Cystoscopy with Clot Evacuation and fulguration bladder neck.  Marland Kitchen EXTRACORPOREAL SHOCK WAVE LITHOTRIPSY  10-15-2009   RIGHT  . KNEE ARTHROSCOPY  1978   RIGHT  . LEFT SHOULDER SURGERY   2002  . LUMBAR FUSION  09-10-2008   L4 - L5  . PERCUTANEOUS NEPHROSTOLITHOTOMY  1979  . PROSTATE BIOPSY  10/16/2012   Procedure: PROSTATE BIOPSY;  Surgeon: Malka So, MD;  Location: [facility];  Service: Urology;  Laterality: N/A;  Through ultrasound.  Marland Kitchen RIGHT/LEFT HEART CATH AND CORONARY/GRAFT ANGIOGRAPHY N/A 10/26/2018   Procedure: RIGHT/LEFT HEART CATH AND CORONARY/GRAFT ANGIOGRAPHY;  Surgeon: Martinique, Peter M, MD;  Location: [facility];  Service: Cardiovascular;  Laterality: N/A;  . SHOULDER ARTHROSCOPY W/ SUBACROMIAL DECOMPRESSION AND DISTAL CLAVICLE EXCISION  10-11-2004   AND PARTIAL ROTATOR CUFF REPAIR  . TRANSTHORACIC ECHOCARDIOGRAM  67/89/3810   LV SYSTOLIC FUNCTION MILDLY REDUCED/ EF 48%/ LV FILLING PATTERN  IS IMPAIRED/ HYPOKINESIS IN THE MID AND BASALAR SEPTUM & ANTEROSEPTUM  . TURP VAPORIZATION  01-19-2010   BPH W/ BOO   . URETEROLITHOTOMY  1976    Social History   Socioeconomic History  . Marital status: Married    Spouse name: Not on file  . Number of children: 4  . Years of education: Not on file  . Highest education level: Not on file  Occupational History  . Not on file  Tobacco Use  . Smoking status: Never Smoker  . Smokeless tobacco: Never Used  Vaping Use  . Vaping Use: Never used  Substance and Sexual Activity  . Alcohol use: Yes    Comment: Occ.   . Drug use: No  . Sexual activity: Not on file  Other Topics Concern  . Not on file  Social History Narrative   Lives in [place], [place], has family and friends in [place].   Social Determinants of Health   Financial Resource Strain: Not on file  Food Insecurity: Not on file  Transportation Needs: Not on file  Physical Activity: Not on file  Stress: Not on file  Social Connections: Not on file    Family History  Problem Relation Age of Onset  . Diabetes Father   . Stomach cancer Neg Hx   . Colon cancer Neg Hx   . Rectal cancer Neg Hx   . Esophageal cancer Neg Hx   . Pancreatic cancer Neg Hx     Review of Systems  Constitutional: Negative for fever.  Respiratory: Positive for shortness of breath (chronic, not worse). Negative for cough and wheezing.   Cardiovascular: Positive for leg swelling (mild). Negative for chest pain and palpitations.  Gastrointestinal:       Jerrye Bushy 2/ week  Neurological: Negative for light-headedness (after bending over for a while) and headaches.       Objective:   Vitals:   11/03/20 1324  BP: 126/74  Pulse: 75  Temp: 98.5 F (36.9 C)  SpO2: 98%   BP Readings from Last 3 Encounters:  11/03/20 126/74  03/24/20 126/70  11/20/19 116/60   Wt Readings from Last 3 Encounters:  11/03/20 238 lb (108 kg)  03/24/20 241 lb (109.3 kg)  11/20/19 248 lb 6.4 oz (112.7 kg)   Body mass index is 32.28 kg/m.   Physical Exam    Constitutional: Appears well-developed and well-nourished. No distress.   HENT:  Head: Normocephalic and atraumatic.  Neck: Neck supple. No tracheal deviation present. No thyromegaly present.  No cervical lymphadenopathy Cardiovascular: Normal rate, regular rhythm and normal heart sounds.   No murmur heard. No carotid bruit .  No edema Pulmonary/Chest: Effort normal and breath sounds normal. No respiratory distress. No has no wheezes. No rales.  Skin: Skin is warm and dry. Not diaphoretic.  Psychiatric: Normal mood and affect. Behavior is normal.      Assessment & Plan:   He is due for colonoscopy-he does follow with GI and will contact them to schedule  See Problem List for Assessment and Plan of chronic medical problems.    This visit occurred during the SARS-CoV-2 public health emergency.  Safety protocols were in place, including screening questions prior to the visit, additional usage of staff PPE, and extensive cleaning of exam room while observing appropriate contact time as indicated for disinfecting solutions.

## 2020-11-03 NOTE — Assessment & Plan Note (Signed)
Chronic No concerning symptoms of angina currently He does take nitroglycerin on occasion, but states this is for GERD only Continue current medications

## 2020-11-03 NOTE — Assessment & Plan Note (Signed)
Chronic BP well controlled Continue amlodipine 5 mg daily, Entresto 49-51 mg twice daily, Lasix 80 mg every other day, Imdur 120 mg twice daily and metoprolol 50 mg daily cmp

## 2020-11-03 NOTE — Assessment & Plan Note (Signed)
Chronic Continue Proscar 5 mg daily 

## 2020-11-04 ENCOUNTER — Telehealth: Payer: Self-pay | Admitting: Internal Medicine

## 2020-11-04 DIAGNOSIS — E1165 Type 2 diabetes mellitus with hyperglycemia: Secondary | ICD-10-CM

## 2020-11-04 LAB — HEMOGLOBIN A1C: Hgb A1c MFr Bld: 12.7 % — ABNORMAL HIGH (ref 4.6–6.5)

## 2020-11-04 MED ORDER — HUMULIN 70/30 (70-30) 100 UNIT/ML ~~LOC~~ SUSP: SUBCUTANEOUS | 3 refills | Status: DC

## 2020-11-04 NOTE — Telephone Encounter (Signed)
Please call him.  His sugars are extremely high and uncontrolled.  His sugar yesterday when he had blood work done was 500.  His A1c is 12.7.  This will cause further damage to his kidneys and heart.  We need to get his sugars controlled.  I think he needs to see an endocrinologist and I would like to refer him if he agrees.  He needs to start checking his sugars regularly.  ?  Is he taking the Jardiance for his diabetes?   We need to increase his insulin-I would recommend increasing to 75 units with breakfast and 40 units with supper.  He needs to do better with his diet.  His kidney function is stable.  His liver tests are normal.  He has mild anemia that is stable.  His cholesterol is too high and most likely lifestyle changes will help since it was better controlled last year.  Let me know about the endocrinologist.  Medication list updated as far as his insulin dose

## 2020-11-04 NOTE — Telephone Encounter (Signed)
Called and left message for patient to return call to clinic.  If he calls back please transfer him to me so I can go over results.

## 2020-11-04 NOTE — Telephone Encounter (Signed)
We were unable to reach patient nurse tried 3x and 1x with me leaving pt message and nurse left pt a message advised to return call if did not understand message 11/03/20  Dr. TMS 

## 2020-11-05 ENCOUNTER — Other Ambulatory Visit: Payer: Self-pay | Admitting: Internal Medicine

## 2020-11-05 DIAGNOSIS — I25119 Atherosclerotic heart disease of native coronary artery with unspecified angina pectoris: Secondary | ICD-10-CM

## 2020-11-05 NOTE — Telephone Encounter (Signed)
Patient returned call. He can be reached at [phone]

## 2020-11-05 NOTE — Addendum Note (Signed)
Addended by: Binnie Rail on: 11/05/2020 04:18 PM   Modules accepted: Orders

## 2020-11-19 ENCOUNTER — Other Ambulatory Visit: Payer: Self-pay | Admitting: Cardiology

## 2020-11-19 ENCOUNTER — Other Ambulatory Visit: Payer: Self-pay | Admitting: Internal Medicine

## 2020-11-19 DIAGNOSIS — I25119 Atherosclerotic heart disease of native coronary artery with unspecified angina pectoris: Secondary | ICD-10-CM

## 2020-12-01 LAB — HM DIABETES EYE EXAM

## 2020-12-03 ENCOUNTER — Telehealth: Payer: Self-pay | Admitting: Internal Medicine

## 2020-12-03 ENCOUNTER — Other Ambulatory Visit: Payer: Self-pay | Admitting: Internal Medicine

## 2020-12-03 DIAGNOSIS — I25119 Atherosclerotic heart disease of native coronary artery with unspecified angina pectoris: Secondary | ICD-10-CM

## 2020-12-03 MED ORDER — NITROGLYCERIN 0.4 MG SL SUBL: SUBLINGUAL_TABLET | SUBLINGUAL | 0 refills | Status: DC

## 2020-12-03 NOTE — Telephone Encounter (Signed)
1.Medication Requested: nitroGLYCERIN (NITROSTAT) 0.4 MG SL tablet  Patient has asked if he can have 100 tablets instead of 25 and states he is complety out.  I have informed the patient someone will give him a call about his request if there are any issues

## 2020-12-03 NOTE — Telephone Encounter (Signed)
sent 

## 2020-12-09 ENCOUNTER — Telehealth: Payer: Self-pay | Admitting: Cardiology

## 2020-12-09 NOTE — Telephone Encounter (Signed)
*  STAT* If patient is at the pharmacy, call can be transferred to refill team.   1. Which medications need to be refilled? (please list name of each medication and dose if known)  ENTRESTO 49-51 MG  2. Which pharmacy/location (including street and city if local pharmacy) is medication to be sent to?  [place], [place]  3. Do they need a 30 day or 90 day supply? 30 day

## 2020-12-10 MED ORDER — ENTRESTO 49-51 MG PO TABS: 1.0000 | ORAL_TABLET | Freq: Two times a day (BID) | ORAL | 0 refills | Status: DC

## 2020-12-17 ENCOUNTER — Encounter: Payer: Self-pay | Admitting: Internal Medicine

## 2020-12-17 NOTE — Progress Notes (Signed)
Outside notes received. Information abstracted. Notes sent to scan.  

## 2020-12-29 ENCOUNTER — Other Ambulatory Visit: Payer: Self-pay | Admitting: Cardiology

## 2020-12-29 DIAGNOSIS — I25119 Atherosclerotic heart disease of native coronary artery with unspecified angina pectoris: Secondary | ICD-10-CM

## 2021-01-20 LAB — URINALYSIS W/ RFLX MICROSCOPIC
Bilirubin Urine: NEGATIVE
Blood, Urine: NEGATIVE
Glucose, UA: >=1000 mg/dL — AB
Ketones, Urine: NEGATIVE mg/dL
Leukocyte Esterase, Urine: NEGATIVE
Nitrite, Urine: NEGATIVE
Protein, UA: 30 — AB
Specific Gravity, UA: 1.015 (ref 1.003–1.035)
Urobilinogen, Urine: 0.2 EU/dL
pH, UA: 6 (ref 4.5–8.0)

## 2021-01-20 LAB — COMPREHENSIVE METABOLIC PANEL
ALT: 32 U/L (ref 0–50)
AST: 29 U/L (ref 0–50)
Albumin/Globulin Ratio: 1.8 mmol/L (ref 1.00–2.70)
Albumin: 4.5 g/dL (ref 3.5–5.2)
Alk Phosphatase: 83 U/L (ref 40–130)
Anion Gap: 12 mmol/L (ref 2–17)
BUN: 32 mg/dL — ABNORMAL HIGH (ref 8–23)
Bun/Cre Ratio: 17.8 — ABNORMAL HIGH (ref 6.0–17.0)
CO2: 24 mmol/L (ref 22–29)
Calcium: 9.5 mg/dL (ref 8.8–10.2)
Chloride: 100 mmol/L (ref 98–107)
Creatinine: 1.8 mg/dL — ABNORMAL HIGH (ref 0.7–1.3)
GFR African American: 43 mL/min/{1.73_m2} — ABNORMAL LOW (ref >=90)
GFR Non-African American: 37 mL/min/{1.73_m2} — ABNORMAL LOW (ref >=90)
Globulin: 2.5 g/dL (ref 1.9–4.4)
Glucose: 328 mg/dL — ABNORMAL HIGH (ref 70–99)
OSMOLALITY CALCULATED: 291 mOsm/kg — ABNORMAL HIGH (ref 270–287)
Potassium: 4.9 mmol/L (ref 3.5–5.3)
Sodium: 136 mmol/L (ref 135–145)
Total Bilirubin: 0.99 mg/dL (ref 0.00–1.20)
Total Protein: 7 g/dL (ref 6.4–8.3)

## 2021-01-20 LAB — HEMOGLOBIN A1C
Est. Avg. Glucose, WB: 289
Est. Avg. Glucose-calculated: 339
Hemoglobin A1C: 11.7 % — ABNORMAL HIGH (ref 4.0–6.0)

## 2021-01-20 LAB — MICROSCOPIC URINALYSIS
BACTERIA, URINE: NONE SEEN
MUCUS, URINE: NONE SEEN /LPF
RBC, UA: NONE SEEN /HPF (ref 0–2)
WBC, UA: NONE SEEN /HPF (ref 0–2)

## 2021-01-20 LAB — LIPID PANEL
Chol/HDL Ratio: 3.8 (ref 0.0–4.4)
Cholesterol: 177 mg/dL (ref 100–200)
HDL: 47 mg/dL (ref >=40)
LDL Cholesterol: 59.8 mg/dL (ref 0.0–100.0)
LDL/HDL Ratio: 1.3
Triglycerides: 351 mg/dL — ABNORMAL HIGH (ref 0–149)
VLDL: 70.2 mg/dL — ABNORMAL HIGH (ref 5.0–40.0)

## 2021-01-20 LAB — CBC
Hematocrit: 38.5 % (ref 38.0–52.0)
Hemoglobin: 13.3 g/dL (ref 13.0–17.3)
MCH: 30.2 pg (ref 27.0–34.5)
MCHC: 34.5 g/dL (ref 32.0–36.0)
MCV: 87.3 fL (ref 84.0–100.0)
MPV: 10.7 fL (ref 7.2–13.2)
NRBC Absolute: 0 10*3/uL (ref 0.000–0.012)
NRBC Automated: 0 % (ref 0.0–0.2)
Platelets: 166 10*3/uL (ref 140–440)
RBC: 4.41 x10e6/mcL (ref 4.00–5.60)
RDW: 13.2 % (ref 11.0–16.0)
WBC: 8.3 10*3/uL (ref 3.8–10.6)

## 2021-01-20 LAB — MICROALBUMIN / CREATININE URINE RATIO
Creatinine, Ur: 51.1 mg/dL (ref 39.0–259.0)
Microalb, Ur: 24.5 mg/dL — ABNORMAL HIGH (ref 0.0–20.0)
Microalbumin Creatinine Ratio: 479 mg/g Cr — ABNORMAL HIGH (ref 0–30)

## 2021-01-20 LAB — PROSTATE SPECIFIC ANTIGEN, TOTAL: PSA: 4.22 ng/mL — ABNORMAL HIGH (ref 0.000–4.000)

## 2021-01-20 LAB — URIC ACID: Uric Acid: 6.1 mg/dL (ref 3.4–7.0)

## 2021-01-20 LAB — TSH WITH REFLEX TO FT4: TSH: 2.25 mcIU/mL (ref 0.358–3.740)

## 2021-01-28 ENCOUNTER — Ambulatory Visit: Payer: Medicare Other | Admitting: Cardiology

## 2021-01-28 ENCOUNTER — Other Ambulatory Visit: Payer: Self-pay

## 2021-01-28 ENCOUNTER — Telehealth: Payer: Self-pay | Admitting: Internal Medicine

## 2021-01-28 ENCOUNTER — Encounter: Payer: Self-pay | Admitting: Cardiology

## 2021-01-28 VITALS — BP 110/58 | HR 65 | Ht 72.0 in | Wt 250.0 lb

## 2021-01-28 DIAGNOSIS — E782 Mixed hyperlipidemia: Secondary | ICD-10-CM

## 2021-01-28 DIAGNOSIS — I255 Ischemic cardiomyopathy: Secondary | ICD-10-CM | POA: Diagnosis not present

## 2021-01-28 DIAGNOSIS — E1159 Type 2 diabetes mellitus with other circulatory complications: Secondary | ICD-10-CM

## 2021-01-28 DIAGNOSIS — Z951 Presence of aortocoronary bypass graft: Secondary | ICD-10-CM

## 2021-01-28 DIAGNOSIS — I10 Essential (primary) hypertension: Secondary | ICD-10-CM

## 2021-01-28 DIAGNOSIS — R251 Tremor, unspecified: Secondary | ICD-10-CM

## 2021-01-28 DIAGNOSIS — I25119 Atherosclerotic heart disease of native coronary artery with unspecified angina pectoris: Secondary | ICD-10-CM | POA: Diagnosis not present

## 2021-01-28 DIAGNOSIS — Z794 Long term (current) use of insulin: Secondary | ICD-10-CM

## 2021-01-28 DIAGNOSIS — I5042 Chronic combined systolic (congestive) and diastolic (congestive) heart failure: Secondary | ICD-10-CM

## 2021-01-28 NOTE — Patient Instructions (Signed)
Medication Instructions:  Your Physician recommend you continue on your current medication as directed.    *If you need a refill on your cardiac medications before your next appointment, please call your pharmacy*   Lab Work: None   Testing/Procedures: None   Follow-Up: At [facility], you and your health needs are our priority.  As part of our continuing mission to provide you with exceptional heart care, we have created designated Provider Care Teams.  These Care Teams include your primary Cardiologist (physician) and Advanced Practice Providers (APPs -  Physician Assistants and Nurse Practitioners) who all work together to provide you with the care you need, when you need it.  We recommend signing up for the patient portal called "MyChart".  Sign up information is provided on this After Visit Summary.  MyChart is used to connect with patients for Virtual Visits (Telemedicine).  Patients are able to view lab/test results, encounter notes, upcoming appointments, etc.  Non-urgent messages can be sent to your provider as well.   To learn more about what you can do with MyChart, go to [email or web address].    Your next appointment:   6 month(s) @ [address] [place], [place] [address]   The format for your next appointment:   In Person  Provider:   Bridgette Christopher, MD    

## 2021-01-28 NOTE — Telephone Encounter (Signed)
Referral ordered

## 2021-01-28 NOTE — Telephone Encounter (Signed)
Patient states Dr. Quay Burow told him to call if his shakes got worse and they have so he is wondering if he can be referred to neurology

## 2021-01-28 NOTE — Progress Notes (Signed)
Cardiology Office Note:    Date:  01/28/2021   ID:  Russell Trevino, DOB Apr 17, 1951, MRN [id number]  PCP:  Binnie Rail, MD  Cardiologist:  Buford Dresser, MD PhD  Referring MD: Binnie Rail, MD   CC: follow up   History of Present Illness:    Russell Trevino is a 70 y.o. male with a hx of CAD s/p prior CABG, NSTEMI 10/2018, ischemic cardiomyopathy with EF 40-45% who is seen for follow up.   Cardiac history: Hospitalized 10/2018 for NSTEMI and heart failure symptoms. Severe native CAD, no intervenable targets on cath. Recommended for medication management. Prior history notable for NSTEMI in 2006 that lead to CABG, with LIMA-LAD, L Radial-OM-D1, SVG-PDA. Noted to have ischemic cardiomyopathy with EF 40-45%. He was readmitted and discharged with heart failure after running out of furosemide for two weeks.    Today: He is feeling okay overall, although he is having tremors. He is concerned he has early stages of Parkinson's disease, but has not been officially diagnosed. His tremors seem to worsen when lifting objects or while sitting.   Lately he does not have much stamina, and reports a mile of walking is his maximum limit before needing to stop and rest. He believes this is not improving but also not worsening, the mile limit is "steady". His breathing is stable.  He denies any chest pain, shortness of breath, or palpitations. No headaches, lightheadedness, or syncope to report. Also has no lower extremity edema, orthopnea or PND. No hematuria or blood in his stool.  He is compliant with his medications and continues to tolerate them.   Past Medical History:  Diagnosis Date  . BPH (benign prostatic hypertrophy)   . Cardiomyopathy, ischemic   . Coronary artery disease    at [facility] [place] 2009 (GRAFTS PATENT), cath 05/2018 w/ SVG-PDA 100%>>med rx  . Diabetes mellitus, type 2 ([place])   . Frequency of urination   . GERD (gastroesophageal reflux disease)   . Gross hematuria    . History of CHF (congestive heart failure) 2007  . History of kidney stones   . History of myocardial infarction 2006, 2019   2006>>CABG, 2019 cath w/ med rx  . History of urinary retention   . Hyperlipidemia   . Nocturia   . Renal calculi    BILATERAL PER CT SCAN--  NON-OBSTRUCTIVE  . S/P CABG x 4 2006    Past Surgical History:  Procedure Laterality Date  . CARDIAC CATHETERIZATION  01/17/2008   GRAFTS PATENT / EF 40%,  DR SANTOS ([facility])  . CORONARY ARTERY BYPASS GRAFT  04/2005   LIMA-LAD, L radial-OM-D1 and SVG-PDA  . CYSTO / RIGHT URETERAL STENT PLACEMENT  10-08-1999  . CYSTO/  REMOVAL BLADDER STONES  01-14-2010  . CYSTOSCOPY  10/16/2012   Procedure: CYSTOSCOPY;  Surgeon: Malka So, MD;  Location: [facility];  Service: Urology;  Laterality: N/A;  Cystoscopy with Clot Evacuation and fulguration bladder neck.  Marland Kitchen EXTRACORPOREAL SHOCK WAVE LITHOTRIPSY  10-15-2009   RIGHT  . KNEE ARTHROSCOPY  1978   RIGHT  . LEFT SHOULDER SURGERY   2002  . LUMBAR FUSION  09-10-2008   L4 - L5  . PERCUTANEOUS NEPHROSTOLITHOTOMY  1979  . PROSTATE BIOPSY  10/16/2012   Procedure: PROSTATE BIOPSY;  Surgeon: Malka So, MD;  Location: [facility];  Service: Urology;  Laterality: N/A;  Through ultrasound.  Marland Kitchen RIGHT/LEFT HEART CATH AND CORONARY/GRAFT ANGIOGRAPHY N/A 10/26/2018   Procedure:  RIGHT/LEFT HEART CATH AND CORONARY/GRAFT ANGIOGRAPHY;  Surgeon: Martinique, Peter M, MD;  Location: [facility];  Service: Cardiovascular;  Laterality: N/A;  . SHOULDER ARTHROSCOPY W/ SUBACROMIAL DECOMPRESSION AND DISTAL CLAVICLE EXCISION  10-11-2004   AND PARTIAL ROTATOR CUFF REPAIR  . TRANSTHORACIC ECHOCARDIOGRAM  53/97/6734   LV SYSTOLIC FUNCTION MILDLY REDUCED/ EF 48%/ LV FILLING PATTERN  IS IMPAIRED/ HYPOKINESIS IN THE MID AND BASALAR SEPTUM & ANTEROSEPTUM  . TURP VAPORIZATION  01-19-2010   BPH W/ BOO  . URETEROLITHOTOMY  1976    Current Medications: Current Outpatient  Medications on File Prior to Visit  Medication Sig  . amLODipine (NORVASC) 5 MG tablet Take 1 tablet (5 mg total) by mouth daily.  Marland Kitchen aspirin 81 MG tablet Take 81 mg by mouth daily at 12 noon.   . BD VEO INSULIN SYR ULTRAFINE 31G X 15/64" 1 ML MISC use as directed TO INJECT INSULIN  twice a day  . clopidogrel (PLAVIX) 75 MG tablet TAKE 1 TABLET(75 MG) BY MOUTH DAILY  . empagliflozin (JARDIANCE) 10 MG TABS tablet Take 10 mg by mouth daily before breakfast.  . ENTRESTO 49-51 MG TAKE 1 TABLET BY MOUTH TWICE DAILY  . ezetimibe (ZETIA) 10 MG tablet Take 1 tablet (10 mg total) by mouth every evening.  . finasteride (PROSCAR) 5 MG tablet TAKE 1 TABLET(5 MG) BY MOUTH DAILY  . furosemide (LASIX) 80 MG tablet Take 1 tablet (80 mg total) by mouth every other day. TAKE ON EMPTY STOMACH  . insulin NPH-regular Human (HUMULIN 70/30) (70-30) 100 UNIT/ML injection 75 units with breakfast, and 40 units with supper  . isosorbide mononitrate (IMDUR) 120 MG 24 hr tablet Take 1 tablet (120 mg total) by mouth 2 (two) times daily.  . metoCLOPramide (REGLAN) 10 MG tablet TAKE 1 TABLET BY MOUTH BEFORE MEALS AND AT BEDTIME  . metoprolol succinate (TOPROL-XL) 50 MG 24 hr tablet Take 1 tablet (50 mg total) by mouth daily at 12 noon. Take with or immediately following a meal.  . nitroGLYCERIN (NITROSTAT) 0.4 MG SL tablet DISSOLVE 1 TABLET UNDER THE TONGUE EVERY 5 MINUTES AS NEEDED FOR CHEST PAIN. CALL MD/ 911 IF TAKE 2 DOSES. MAX 3 DOSES PER DAY  . pantoprazole (PROTONIX) 40 MG tablet TAKE 1 TABLET(40 MG) BY MOUTH TWICE DAILY  . PEPCID 40 MG tablet Take 40 mg by mouth at bedtime.  . potassium chloride SA (K-DUR) 20 MEQ tablet Take 1 tablet (20 mEq total) by mouth daily as needed (when you take lasix).  . ranolazine (RANEXA) 500 MG 12 hr tablet Take 1 tablet (500 mg total) by mouth 2 (two) times daily.  . rosuvastatin (CRESTOR) 40 MG tablet TAKE 1 TABLET(40 MG) BY MOUTH DAILY   No current facility-administered medications on  file prior to visit.     Allergies:   Patient has no known allergies.   Social History   Tobacco Use  . Smoking status: Never Smoker  . Smokeless tobacco: Never Used  Vaping Use  . Vaping Use: Never used  Substance Use Topics  . Alcohol use: Yes    Comment: Occ.   . Drug use: No    Family History: The patient's family history includes Diabetes in his father. There is no history of Stomach cancer, Colon cancer, Rectal cancer, Esophageal cancer, or Pancreatic cancer.  ROS:   Please see the history of present illness.   (+) Tremors (+) Fatigue Additional pertinent ROS negative except as noted in HPI.    EKGs/Labs/Other Studies  Reviewed:    The following studies were reviewed today:  Echo 10/26/18 1. The left ventricle has mild-moderately reduced systolic function, with an ejection fraction of 40-45%. The cavity size was severely dilated. Left ventricular diastolic Doppler parameters are consistent with pseudonormalization Elevated left ventricular end-diastolic pressure.  2. Poor images preclude accurate assessment of wall motion even with definity.  3. The right ventricle has normal systolic function. The cavity was normal. There is no increase in right ventricular wall thickness.  4. Left atrial size was moderately dilated.  5. The mitral valve is normal in structure. Mitral valve regurgitation is moderate by color flow Doppler.  6. The tricuspid valve is normal in structure.  7. The aortic valve is tricuspid.  8. The pulmonic valve was normal in structure.  9. There is mild dilatation of the ascending aorta measuring 38 mm. 10. Right atrial pressure is estimated at 3 mmHg.  R/LHC 10/26/18  Mid LM to Dist LM lesion is 95% stenosed.  Prox LAD lesion is 100% stenosed.  Prox Cx lesion is 100% stenosed.  Prox RCA to Dist RCA lesion is 100% stenosed.  LV end diastolic pressure is normal.  Hemodynamic findings consistent with mild pulmonary hypertension.   1.  Occlusive native vessel CAD.     -95% distal left main    - 100% proximal LAD after the first septal perforator    - 100% LCx after a very small OM1.    - 100% proximal RCA 2. Patent LIMA to the LAD 3. Patent free radial graft arising from the mid LIMA graft and supplying the first diagonal and OM 4. SVG to RCA is known to be occluded. The distal RCA is supplied by collaterals.  5. Normal LV filling pressures 6. Mild pulmonary HTN. 7. Preserved cardiac output.  Plan: continue medical therapy for CHF. Will switch IV lasix to po. Stop IV Ntg and heparin.   EKG:  EKG is personally reviewed.   01/28/2021: NSR, iLBBB, nonspecific ST changes 08/30/19: sinus bradycardia at 56 bpm, iLBBB. Inferior/lateral ST changes similar location but more pronounced than prior  Recent Labs: 11/03/2020: ALT 17; BUN 37; Creatinine, Ser 1.83; Hemoglobin 12.8; Platelets 164.0; Potassium 4.5; Sodium 129  Recent Lipid Panel    Component Value Date/Time   CHOL 176 11/03/2020 1404   TRIG (H) 11/03/2020 1404    416.0 Triglyceride is over 400; calculations on Lipids are invalid.   HDL 47.80 11/03/2020 1404   CHOLHDL 4 11/03/2020 1404   VLDL 38.6 11/20/2019 1033   LDLCALC 58 11/20/2019 1033   LDLDIRECT 80.0 11/03/2020 1404    Physical Exam:    VS:  BP (!) 110/58 (BP Location: Left Arm, Patient Position: Sitting, Cuff Size: Normal)   Pulse 65   Ht 6' (1.829 m)   Wt 250 lb (113.4 kg)   BMI 33.91 kg/m     Wt Readings from Last 3 Encounters:  01/28/21 250 lb (113.4 kg)  11/03/20 238 lb (108 kg)  03/24/20 241 lb (109.3 kg)   GEN: Well nourished, well developed in no acute distress HEENT: Normal, moist mucous membranes NECK: No JVD CARDIAC: regular rhythm, normal S1 and S2, no rubs or gallops. No murmur. VASCULAR: Radial and DP pulses 2+ bilaterally. No carotid bruits RESPIRATORY:  Clear to auscultation without rales, wheezing or rhonchi  ABDOMEN: Soft, non-tender, non-distended MUSCULOSKELETAL:   Ambulates independently SKIN: Warm and dry, no edema NEUROLOGIC:  Alert and oriented x 3. No focal neuro deficits noted. Has resting and intention  tremor. PSYCHIATRIC:  Normal affect   ASSESSMENT:    1. Ischemic cardiomyopathy   2. Coronary artery disease involving native coronary artery of native heart with angina pectoris ([place])   3. Hx of CABG   4. Mixed hyperlipidemia   5. Chronic combined systolic and diastolic heart failure ([place])   6. Essential hypertension, benign   7. Type 2 diabetes mellitus with other circulatory complication, with long-term current use of insulin (HCC)    PLAN:    CAD s/p CABG 2006: known SVG-PDA is occluded, other grafts are patent. No intervenable targets. Severe native disease -see prior discussion, continuing DAPT indefinitely -on metoprolol succinate 50 mg daily -on imdur 120 mg BID, feels better than taking daily -on ranexa 500 mg BID -using amlodipine 5 mg for both BP and antianginal effects -has sublingual nitroglycerin -counseled on life threatening hypotension with isosorbide, nitroglycerin and sildenafil/erectile dysfunction meds in this class. Would not use PDE5i given this. -no recent chest pain  Chronic systolic and diastolic heart failure:  -NYHA class II symptoms today, stable -difficult echo windows, but known to be 40-45% with grade 2 diastolic dysfunction; due to ischemic cardiomyopathy -continue metoprolol succinate 50 mg daily -tolerating entresto 49-51 mg BID -continue lasix 80 mg as needed (about every other day) and potassium when he takes lasix. Has CKD stage 4, need to monitor renal function/potassium periodically, last 10/2020 -educated on heart failure, including daily weights, sodium and fluid guidelines, signs/symptoms to watch for, diet and activity recommendations -on empagliflozin (SGLT2i)  Hypertension: at goal of <130/80, continue medications as listed  Hyperlipidemia, mixed: -LDL goal <70. Last LDL 10/2020 80. On  rosuvastatin 40 mg, on ezetimibe as well. If he cannot get LDL to <70, need to consider PCSK9i. We have discussed these and vascepa (given TG) in the past. Cost is a major concern. After shared decision making, no changes today  Type II diabetes, on insulin and metformin, with chronic kidney disease stage 3. Most recent B8G 66.5 -trulicity was too expensive for him -on jardiance now, as above -A1c elevated at last check, control improving, on insulin  Secondary prevention lifestyle recommendations: -recommend heart healthy/Mediterranean diet, with whole grains, fruits, vegetable, fish, [place] meats, nuts, and olive oil. Limit salt. -recommend moderate walking, 3-5 times/week for 30-50 minutes each session. Aim for at least 150 minutes.week. Goal should be pace of 3 miles/hours, or walking 1.5 miles in 30 minutes -recommend avoidance of tobacco products. Avoid excess alcohol.  Follow up: 6 months or sooner as needed  Medication Adjustments/Labs and Tests Ordered: Current medicines are reviewed at length with the patient today.  Concerns regarding medicines are outlined above.  Orders Placed This Encounter  Procedures  . EKG 12-Lead   No orders of the defined types were placed in this encounter.   Patient Instructions  Medication Instructions:  Your Physician recommend you continue on your current medication as directed.    *If you need a refill on your cardiac medications before your next appointment, please call your pharmacy*   Lab Work: None   Testing/Procedures: None   Follow-Up: At [facility], you and your health needs are our priority.  As part of our continuing mission to provide you with exceptional heart care, we have created designated Provider Care Teams.  These Care Teams include your primary Cardiologist (physician) and Advanced Practice Providers (APPs -  Physician Assistants and Nurse Practitioners) who all work together to provide you with the care you need,  when you need it.  We  recommend signing up for the patient portal called "MyChart".  Sign up information is provided on this After Visit Summary.  MyChart is used to connect with patients for Virtual Visits (Telemedicine).  Patients are able to view lab/test results, encounter notes, upcoming appointments, etc.  Non-urgent messages can be sent to your provider as well.   To learn more about what you can do with MyChart, go to [email or web address].    Your next appointment:   6 month(s) @ [address] [place], [place] [address]   The format for your next appointment:   In Person  Provider:   Buford Dresser, MD      [facility] Stumpf,acting as a scribe for Buford Dresser, MD.,have documented all relevant documentation on the behalf of Buford Dresser, MD,as directed by  Buford Dresser, MD while in the presence of Buford Dresser, MD.  I, Buford Dresser, MD, have reviewed all documentation for this visit. The documentation on 01/28/21 for the exam, diagnosis, procedures, and orders are all accurate and complete.  Signed, Buford Dresser, MD PhD 01/28/2021   [place]

## 2021-02-01 ENCOUNTER — Other Ambulatory Visit: Payer: Self-pay | Admitting: Urology

## 2021-02-01 DIAGNOSIS — C61 Malignant neoplasm of prostate: Secondary | ICD-10-CM

## 2021-02-02 ENCOUNTER — Encounter: Payer: Self-pay | Admitting: Neurology

## 2021-02-04 NOTE — Progress Notes (Signed)
Assessment/Plan:   1.  Parkinsonian tremor  -Patient does not have other features of Parkinson's disease, and does not meet formal criteria for Parkinson's disease.  However, I am concerned that symptoms could potentially be from his metoclopramide.  He has been on that about 1 year and symptoms have been going on for about 6 months.  However, I did tell him that 1 cannot tell the difference between parkinsonian tremor from metoclopramide versus idiopathic.  I did recommend that he talk to his GI doctor (in [place]) about the medication.  If he is able to get off of that and his symptoms do not resolve within 6 months, I really would like to see him back.  We would consider a DaTscan at that point in time.  Patient was agreeable to this approach.    2.  Peripheral neuropathy, likely diabetic   -Patient admits to uncontrolled diabetes.  Last A1c was greater than 12!.  Patient does state that he is working on better control.    Subjective:   Russell Trevino was seen today in the movement disorders clinic for neurologic consultation at the request of Burns, Claudina Lick, MD.  The consultation is for the evaluation of tremor.  Outside records that were made available to me were reviewed.  Tremor: Yes.     How long has it been going on? 6+months  At rest or with activation?  activation  Fam hx of tremor?  Yes.  , mother  Located where?  Bilateral UE, L>R (he is R hand dominant)  Affected by caffeine: doesn't drink a lot of it (drinks 4 bottles soda per day but usually sprite)  Affected by alcohol:  Doesn't drink enough to know  (2 beers/week)  Affected by stress:  No.  Affected by fatigue:  Yes.    Spills soup if on spoon:  Will shake but has more control over the R hand than the L  Tremor inducing meds:  Yes.  , reglan (been on that x 1 year)  -tremor improving meds:  Metoprolol xl, 50 mg daily  Other Specific Symptoms:  Voice: no change Sleep: some restless sleep  Vivid Dreams:   Yes.    Acting out dreams:  No. Wet Pillows: No. Postural symptoms:  No.  Falls?  No. Bradykinesia symptoms: shuffling gait and difficulty getting out of a chair Loss of smell:  No. Loss of taste:  No. Urinary Incontinence:  No. Difficulty Swallowing:  No. Handwriting, micrographia: No. Depression:  No. Memory changes:  No. N/V:  No. Lightheaded:  Yes.  , only if bends over and comes up fast  Syncope: No. Diplopia:  No. Dyskinesia:  No.  Neuroimaging of the brain has not previously been performed.      ALLERGIES:  No Known Allergies  CURRENT MEDICATIONS:  Current Outpatient Medications  Medication Instructions  . amLODipine (NORVASC) 5 mg, Oral, Daily  . aspirin 81 mg, Oral, Daily  . BD VEO INSULIN SYR ULTRAFINE 31G X 15/64" 1 ML MISC use as directed TO INJECT INSULIN  twice a day  . clopidogrel (PLAVIX) 75 MG tablet TAKE 1 TABLET(75 MG) BY MOUTH DAILY  . ENTRESTO 49-51 MG TAKE 1 TABLET BY MOUTH TWICE DAILY  . ezetimibe (ZETIA) 10 mg, Oral, Every evening  . finasteride (PROSCAR) 5 MG tablet TAKE 1 TABLET(5 MG) BY MOUTH DAILY  . furosemide (LASIX) 80 mg, Oral, Every other day, TAKE ON EMPTY STOMACH  . insulin NPH-regular Human (HUMULIN 70/30) (70-30) 100 UNIT/ML  injection 75 units with breakfast, and 40 units with supper  . isosorbide mononitrate (IMDUR) 120 mg, Oral, 2 times daily  . Jardiance 10 mg, Oral, Daily before breakfast  . metoCLOPramide (REGLAN) 10 MG tablet TAKE 1 TABLET BY MOUTH BEFORE MEALS AND AT BEDTIME  . metoprolol succinate (TOPROL-XL) 50 mg, Oral, Daily, Take with or immediately following a meal.  . nitroGLYCERIN (NITROSTAT) 0.4 MG SL tablet DISSOLVE 1 TABLET UNDER THE TONGUE EVERY 5 MINUTES AS NEEDED FOR CHEST PAIN. CALL MD/ 911 IF TAKE 2 DOSES. MAX 3 DOSES PER DAY  . pantoprazole (PROTONIX) 40 MG tablet TAKE 1 TABLET(40 MG) BY MOUTH TWICE DAILY  . Pepcid 40 mg, Oral, Daily at bedtime  . potassium chloride SA (K-DUR) 20 MEQ tablet 20 mEq, Oral, Daily  PRN  . ranolazine (RANEXA) 500 mg, Oral, 2 times daily  . rosuvastatin (CRESTOR) 40 MG tablet TAKE 1 TABLET(40 MG) BY MOUTH DAILY    Objective:   PHYSICAL EXAMINATION:    VITALS:   Vitals:   02/09/21 0838  BP: 126/60  Pulse: 67  SpO2: 97%  Weight: 247 lb (112 kg)  Height: 6' (1.829 m)    GEN:  The patient appears stated age and is in NAD. HEENT:  Normocephalic, atraumatic.  The mucous membranes are moist. The superficial temporal arteries are without ropiness or tenderness. CV:  RRR Lungs:  CTAB Neck/HEME:  There are no carotid bruits bilaterally.  Neurological examination:  Orientation: The patient is alert and oriented x3.  Cranial nerves: There is good facial symmetry.  Extraocular muscles are intact. The visual fields are full to confrontational testing. The speech is fluent and clear. Soft palate rises symmetrically and there is no tongue deviation. Hearing is intact to conversational tone. Sensation: Sensation is intact to light touch throughout (facial, trunk, extremities). Vibration is intact at the bilateral big toe but overall decreased distally. There is no extinction with double simultaneous stimulation.  Motor: Strength is 5/5 in the bilateral upper and lower extremities.   Shoulder shrug is equal and symmetric.  There is no pronator drift. Deep tendon reflexes: Deep tendon reflexes are 0-1/4 at the bilateral biceps, triceps, brachioradialis, patella and achilles. Plantar responses are downgoing bilaterally.  Movement examination: Tone: There is nl tone in the bilateral upper extremities.  The tone in the lower extremities is nl.  There is right upper extremity rest tremor with ambulation. Abnormal movements: there is bilateral UE and RLE resting tremor that increases with distraction Coordination:  There is some slowness with rapid alternating movements, but no true decremation. Gait and Station: The patient has no difficulty arising out of a deep-seated chair  without the use of the hands. The patient's stride length is good, but he has right upper extremity rest tremor with ambulation. I have reviewed and interpreted the following labs independently   Chemistry      Component Value Date/Time   NA 129 (L) 11/03/2020 1404   NA 143 08/30/2019 1537   K 4.5 11/03/2020 1404   CL 96 11/03/2020 1404   CO2 22 11/03/2020 1404   BUN 37 (H) 11/03/2020 1404   BUN 33 (H) 08/30/2019 1537   CREATININE 1.83 (H) 11/03/2020 1404      Component Value Date/Time   CALCIUM 9.2 11/03/2020 1404   ALKPHOS 77 11/03/2020 1404   AST 15 11/03/2020 1404   ALT 17 11/03/2020 1404   BILITOT 1.2 11/03/2020 1404      Lab Results  Component Value Date  TSH 1.74 02/11/2019   Lab Results  Component Value Date   WBC 8.1 11/03/2020   HGB 12.8 (L) 11/03/2020   HCT 37.4 (L) 11/03/2020   MCV 90.2 11/03/2020   PLT 164.0 11/03/2020   Lab Results  Component Value Date   HGBA1C 12.7 (H) 11/03/2020      Total time spent on today's visit was 45 minutes, including both face-to-face time and nonface-to-face time.  Time included that spent on review of records (prior notes available to me/labs/imaging if pertinent), discussing treatment and goals, answering patient's questions and coordinating care.  Cc:  Binnie Rail, MD

## 2021-02-09 ENCOUNTER — Encounter: Payer: Self-pay | Admitting: Neurology

## 2021-02-09 ENCOUNTER — Other Ambulatory Visit: Payer: Self-pay

## 2021-02-09 ENCOUNTER — Ambulatory Visit: Payer: Medicare Other | Admitting: Neurology

## 2021-02-09 VITALS — BP 126/60 | HR 67 | Ht 72.0 in | Wt 247.0 lb

## 2021-02-09 DIAGNOSIS — G2119 Other drug induced secondary parkinsonism: Secondary | ICD-10-CM | POA: Diagnosis not present

## 2021-02-09 DIAGNOSIS — E1142 Type 2 diabetes mellitus with diabetic polyneuropathy: Secondary | ICD-10-CM | POA: Diagnosis not present

## 2021-02-09 NOTE — Patient Instructions (Signed)
You have parkinsonism/parkinsonian tremor, which may be due to your reglan/metoclopramide.  We discussed that you need to call your GI doctor and discuss with him/her.  If you are able to get off of this, we will need to give it 6 months and see how you do.  If you still have tremor at that point, we will consider further testing to measure dopamine levels in your brain.    It was good to see you today!  The physicians and staff at [facility] are committed to providing excellent care. You may receive a survey requesting feedback about your experience at our office. We strive to receive "very good" responses to the survey questions. If you feel that your experience would prevent you from giving the office a "very good " response, please contact our office to try to remedy the situation. We may be reached at [phone]. Thank you for taking the time out of your busy day to complete the survey.

## 2021-02-23 ENCOUNTER — Telehealth: Payer: Self-pay | Admitting: Cardiology

## 2021-02-23 NOTE — Telephone Encounter (Signed)
Note from Joaquim Lai w/medical records Message  Called Dr. Randall Hiss office and told them I needed a request for records on their letterhead , and I would fax records to them when I received it.Waiting on the request . 02/23/21 fsw

## 2021-02-23 NOTE — Telephone Encounter (Signed)
Message routed to medical records to assist with this request

## 2021-02-23 NOTE — Telephone Encounter (Signed)
The patient's PCP called and wanted to see if Dr. Harrell Gave could fax over the cardiac cath, echo, and last office visit from Dr. Harrell Gave. Please fax to [phone]

## 2021-02-26 LAB — FECAL DNA COLORECTAL CANCER SCREENING (COLOGUARD): FIT-DNA (Cologuard): POSITIVE — AB

## 2021-02-26 LAB — COLOGUARD: COLOGUARD: POSITIVE — AB

## 2021-03-20 DIAGNOSIS — M5431 Sciatica, right side: Secondary | ICD-10-CM | POA: Insufficient documentation

## 2021-03-23 NOTE — Progress Notes (Signed)
Subjective:    Patient ID: Russell Trevino, male    DOB: 1951-03-23, 70 y.o.   MRN: [id number]  HPI The patient is here for an acute visit.   L ear pain -  intermittent Left ear pain - like a nerve pain that runs through his ear - lasts several seconds and goes away.  Sometimes it runs down his throat a little.  It is an intensity of 6-7/10.    He is no longer taking reglan - he still has the tremor in the UE - no longer in the LE.  Since then he has been clenching his teeth - he is not sure if that is related to the ear pain.     Sugar better in morning and 160-170's in evening.  He has been taking insulin daily which he was not doing before.   Medications and allergies reviewed with patient and updated if appropriate.  Patient Active Problem List   Diagnosis Date Noted   Ear pain, left 03/24/2021   Gastroparesis due to DM ([place]) 11/03/2020   Benign essential tremor 11/03/2020   Acute cystitis without hematuria 03/24/2020   BPH (benign prostatic hyperplasia) 11/20/2019   CKD (chronic kidney disease) stage 4, GFR 15-29 ml/min (HCC) 08/04/2019   Coronary artery disease involving native coronary artery of native heart with angina pectoris ([place]) 03/04/2019   CKD stage 3 due to type 2 diabetes mellitus ([place]) 01/28/2019   Ischemic cardiomyopathy 11/05/2018   Chronic combined systolic and diastolic heart failure ([place]) 11/05/2018   Diabetes mellitus ([place]) 08/26/2017   GERD (gastroesophageal reflux disease) 07/13/2017   Thoracic back pain 06/21/2016   Hx of CABG 05/26/2016   Prostate cancer ([place]) 05/26/2016   Essential hypertension, benign 05/26/2016   Mixed hyperlipidemia 05/26/2016   Coronary artery disease due to calcified coronary lesion 03/25/2013    Current Outpatient Medications on File Prior to Visit  Medication Sig Dispense Refill   amLODipine (NORVASC) 5 MG tablet Take 1 tablet (5 mg total) by mouth daily. 90 tablet 3   aspirin 81 MG tablet Take 81 mg by mouth daily at  12 noon.      BD VEO INSULIN SYR ULTRAFINE 31G X 15/64" 1 ML MISC use as directed TO INJECT INSULIN  twice a day 10 each 0   clopidogrel (PLAVIX) 75 MG tablet TAKE 1 TABLET(75 MG) BY MOUTH DAILY 30 tablet 0   empagliflozin (JARDIANCE) 10 MG TABS tablet Take 10 mg by mouth daily before breakfast. 30 tablet 5   ENTRESTO 49-51 MG TAKE 1 TABLET BY MOUTH TWICE DAILY 60 tablet 0   ezetimibe (ZETIA) 10 MG tablet Take 1 tablet (10 mg total) by mouth every evening. 90 tablet 1   finasteride (PROSCAR) 5 MG tablet TAKE 1 TABLET(5 MG) BY MOUTH DAILY 90 tablet 1   furosemide (LASIX) 80 MG tablet Take 1 tablet (80 mg total) by mouth every other day. TAKE ON EMPTY STOMACH (Patient not taking: Reported on 02/09/2021) 90 tablet 3   insulin NPH-regular Human (HUMULIN 70/30) (70-30) 100 UNIT/ML injection 75 units with breakfast, and 40 units with supper 50 mL 3   isosorbide mononitrate (IMDUR) 120 MG 24 hr tablet Take 1 tablet (120 mg total) by mouth 2 (two) times daily. 180 tablet 3   metoprolol succinate (TOPROL-XL) 50 MG 24 hr tablet Take 1 tablet (50 mg total) by mouth daily at 12 noon. Take with or immediately following a meal. 90 tablet 3   nitroGLYCERIN (NITROSTAT) 0.4  MG SL tablet DISSOLVE 1 TABLET UNDER THE TONGUE EVERY 5 MINUTES AS NEEDED FOR CHEST PAIN. CALL MD/ 911 IF TAKE 2 DOSES. MAX 3 DOSES PER DAY (Patient not taking: Reported on 02/09/2021) 100 tablet 0   pantoprazole (PROTONIX) 40 MG tablet TAKE 1 TABLET(40 MG) BY MOUTH TWICE DAILY 90 tablet 3   PEPCID 40 MG tablet Take 40 mg by mouth at bedtime.     potassium chloride SA (K-DUR) 20 MEQ tablet Take 1 tablet (20 mEq total) by mouth daily as needed (when you take lasix). 90 tablet 1   ranolazine (RANEXA) 500 MG 12 hr tablet Take 1 tablet (500 mg total) by mouth 2 (two) times daily. 180 tablet 3   rosuvastatin (CRESTOR) 40 MG tablet TAKE 1 TABLET(40 MG) BY MOUTH DAILY 90 tablet 3   No current facility-administered medications on file prior to visit.     Past Medical History:  Diagnosis Date   BPH (benign prostatic hypertrophy)    Cardiomyopathy, ischemic    Coronary artery disease    at [facility], [place] 2009 (GRAFTS PATENT), cath 05/2018 w/ SVG-PDA 100%>>med rx   Diabetes mellitus, type 2 (HCC)    Frequency of urination    GERD (gastroesophageal reflux disease)    Gross hematuria    History of CHF (congestive heart failure) 2007   History of kidney stones    History of myocardial infarction 2006, 2019   2006>>CABG, 2019 cath w/ med rx   History of urinary retention    Hyperlipidemia    Nocturia    Renal calculi    BILATERAL PER CT SCAN--  NON-OBSTRUCTIVE   S/P CABG x 4 2006    Past Surgical History:  Procedure Laterality Date   CARDIAC CATHETERIZATION  01/17/2008   GRAFTS PATENT / EF 40%,  DR SANTOS ([facility])   CORONARY ARTERY BYPASS GRAFT  04/2005   LIMA-LAD, L radial-OM-D1 and SVG-PDA   CYSTO / RIGHT URETERAL STENT PLACEMENT  10-08-1999   CYSTO/  REMOVAL BLADDER STONES  01-14-2010   CYSTOSCOPY  10/16/2012   Procedure: CYSTOSCOPY;  Surgeon: Malka So, MD;  Location: [facility];  Service: Urology;  Laterality: N/A;  Cystoscopy with Clot Evacuation and fulguration bladder neck.   EXTRACORPOREAL SHOCK WAVE LITHOTRIPSY  10-15-2009   RIGHT   KNEE ARTHROSCOPY  1978   RIGHT   LEFT SHOULDER SURGERY   2002   LUMBAR FUSION  09-10-2008   L4 - L5   PERCUTANEOUS NEPHROSTOLITHOTOMY  1979   PROSTATE BIOPSY  10/16/2012   Procedure: PROSTATE BIOPSY;  Surgeon: Malka So, MD;  Location: [facility];  Service: Urology;  Laterality: N/A;  Through ultrasound.   RIGHT/LEFT HEART CATH AND CORONARY/GRAFT ANGIOGRAPHY N/A 10/26/2018   Procedure: RIGHT/LEFT HEART CATH AND CORONARY/GRAFT ANGIOGRAPHY;  Surgeon: Martinique, Peter M, MD;  Location: [facility];  Service: Cardiovascular;  Laterality: N/A;   SHOULDER ARTHROSCOPY W/ SUBACROMIAL DECOMPRESSION AND DISTAL CLAVICLE EXCISION  10-11-2004   AND PARTIAL  ROTATOR CUFF REPAIR   TRANSTHORACIC ECHOCARDIOGRAM  87/56/4332   LV SYSTOLIC FUNCTION MILDLY REDUCED/ EF 48%/ LV FILLING PATTERN  IS IMPAIRED/ HYPOKINESIS IN THE MID AND BASALAR SEPTUM & ANTEROSEPTUM   TURP VAPORIZATION  01-19-2010   BPH W/ BOO   URETEROLITHOTOMY  1976    Social History   Socioeconomic History   Marital status: Married    Spouse name: Not on file   Number of children: 4   Years of education: Not on file  Highest education level: Not on file  Occupational History   Not on file  Tobacco Use   Smoking status: Never   Smokeless tobacco: Never  Vaping Use   Vaping Use: Never used  Substance and Sexual Activity   Alcohol use: Yes    Alcohol/week: 2.0 standard drinks    Types: 2 Cans of beer per week   Drug use: No   Sexual activity: Not on file  Other Topics Concern   Not on file  Social History Narrative   Lives in [place], [place], has family and friends in [place].   Drinking caffeine sodas 3-4 bottles a day   Right handed   Social Determinants of Health   Financial Resource Strain: Not on file  Food Insecurity: Not on file  Transportation Needs: Not on file  Physical Activity: Not on file  Stress: Not on file  Social Connections: Not on file    Family History  Problem Relation Age of Onset   Lung cancer Mother    Diabetes Father    Stomach cancer Neg Hx    Colon cancer Neg Hx    Rectal cancer Neg Hx    Esophageal cancer Neg Hx    Pancreatic cancer Neg Hx     Review of Systems  Constitutional:  Negative for fever.  HENT:  Positive for ear pain and sneezing. Negative for congestion, sinus pain and sore throat.   Musculoskeletal:  Negative for neck pain and neck stiffness.  Neurological:  Positive for tremors (UE only). Negative for light-headedness, numbness (none in head/neck) and headaches.      Objective:   Vitals:   03/24/21 0823  BP: 136/70  Pulse: 78  Temp: 98.3 F (36.8 C)  SpO2: 98%   BP Readings from Last 3 Encounters:   03/24/21 136/70  02/09/21 126/60  01/28/21 (!) 110/58   Wt Readings from Last 3 Encounters:  03/24/21 245 lb (111.1 kg)  02/09/21 247 lb (112 kg)  01/28/21 250 lb (113.4 kg)   Body mass index is 33.23 kg/m.   Physical Exam    GENERAL APPEARANCE: Appears stated age, well appearing, NAD EYES: conjunctiva clear, no icterus HENT: bilateral tympanic membranes and ear canals normal, oropharynx with no erythema or exudates, trachea midline, no cervical or supraclavicular lymphadenopathy LUNGS: Unlabored breathing, good air entry bilaterally, clear to auscultation without wheeze or crackles CARDIOVASCULAR: Normal S1,S2 , no edema SKIN: Warm, dry      Assessment & Plan:    See Problem List for Assessment and Plan of chronic medical problems.    This visit occurred during the SARS-CoV-2 public health emergency.  Safety protocols were in place, including screening questions prior to the visit, additional usage of staff PPE, and extensive cleaning of exam room while observing appropriate contact time as indicated for disinfecting solutions.

## 2021-03-24 ENCOUNTER — Encounter: Payer: Self-pay | Admitting: Internal Medicine

## 2021-03-24 ENCOUNTER — Ambulatory Visit (INDEPENDENT_AMBULATORY_CARE_PROVIDER_SITE_OTHER): Payer: Medicare Other | Admitting: Internal Medicine

## 2021-03-24 ENCOUNTER — Other Ambulatory Visit: Payer: Self-pay

## 2021-03-24 VITALS — BP 136/70 | HR 78 | Temp 98.3°F | Ht 72.0 in | Wt 245.0 lb

## 2021-03-24 DIAGNOSIS — N183 Chronic kidney disease, stage 3 unspecified: Secondary | ICD-10-CM

## 2021-03-24 DIAGNOSIS — Z794 Long term (current) use of insulin: Secondary | ICD-10-CM | POA: Diagnosis not present

## 2021-03-24 DIAGNOSIS — H9202 Otalgia, left ear: Secondary | ICD-10-CM | POA: Diagnosis not present

## 2021-03-24 DIAGNOSIS — E1122 Type 2 diabetes mellitus with diabetic chronic kidney disease: Secondary | ICD-10-CM | POA: Diagnosis not present

## 2021-03-24 DIAGNOSIS — N1832 Chronic kidney disease, stage 3b: Secondary | ICD-10-CM

## 2021-03-24 LAB — COMPREHENSIVE METABOLIC PANEL
ALT: 19 U/L (ref 0–53)
AST: 16 U/L (ref 0–37)
Albumin: 4.1 g/dL (ref 3.5–5.2)
Alkaline Phosphatase: 60 U/L (ref 39–117)
BUN: 28 mg/dL — ABNORMAL HIGH (ref 6–23)
CO2: 24 mEq/L (ref 19–32)
Calcium: 9.2 mg/dL (ref 8.4–10.5)
Chloride: 104 mEq/L (ref 96–112)
Creatinine, Ser: 1.72 mg/dL — ABNORMAL HIGH (ref 0.40–1.50)
GFR: 39.71 mL/min — ABNORMAL LOW (ref >60.00)
Glucose, Bld: 276 mg/dL — ABNORMAL HIGH (ref 70–99)
Potassium: 4.2 mEq/L (ref 3.5–5.1)
Sodium: 136 mEq/L (ref 135–145)
Total Bilirubin: 1.4 mg/dL — ABNORMAL HIGH (ref 0.2–1.2)
Total Protein: 7 g/dL (ref 6.0–8.3)

## 2021-03-24 LAB — CBC WITH DIFFERENTIAL/PLATELET
Basophils Absolute: 0 10*3/uL (ref 0.0–0.1)
Basophils Relative: 0.4 % (ref 0.0–3.0)
Eosinophils Absolute: 0.2 10*3/uL (ref 0.0–0.7)
Eosinophils Relative: 2.1 % (ref 0.0–5.0)
HCT: 40.2 % (ref 39.0–52.0)
Hemoglobin: 13.9 g/dL (ref 13.0–17.0)
Lymphocytes Relative: 18.1 % (ref 12.0–46.0)
Lymphs Abs: 1.3 10*3/uL (ref 0.7–4.0)
MCHC: 34.6 g/dL (ref 30.0–36.0)
MCV: 88.8 fl (ref 78.0–100.0)
Monocytes Absolute: 0.6 10*3/uL (ref 0.1–1.0)
Monocytes Relative: 8.1 % (ref 3.0–12.0)
Neutro Abs: 5.2 10*3/uL (ref 1.4–7.7)
Neutrophils Relative %: 71.3 % (ref 43.0–77.0)
Platelets: 161 10*3/uL (ref 150.0–400.0)
RBC: 4.53 Mil/uL (ref 4.22–5.81)
RDW: 14.8 % (ref 11.5–15.5)
WBC: 7.3 10*3/uL (ref 4.0–10.5)

## 2021-03-24 LAB — LIPID PANEL
Cholesterol: 139 mg/dL (ref 0–200)
HDL: 42.9 mg/dL (ref >39.00)
NonHDL: 96.47
Total CHOL/HDL Ratio: 3
Triglycerides: 251 mg/dL — ABNORMAL HIGH (ref 0.0–149.0)
VLDL: 50.2 mg/dL — ABNORMAL HIGH (ref 0.0–40.0)

## 2021-03-24 LAB — LDL CHOLESTEROL, DIRECT: Direct LDL: 66 mg/dL

## 2021-03-24 LAB — HEMOGLOBIN A1C: Hgb A1c MFr Bld: 9.2 % — ABNORMAL HIGH (ref 4.6–6.5)

## 2021-03-24 MED ORDER — GABAPENTIN 100 MG PO CAPS: ORAL_CAPSULE | ORAL | 3 refills | Status: DC

## 2021-03-24 NOTE — Patient Instructions (Signed)
  Blood work was ordered.     Medications changes include :   gabapentin 100 mg in morning and 200 mg at bedtime  Your prescription(s) have been submitted to your pharmacy. Please take as directed and contact our office if you believe you are having problem(s) with the medication(s).   Please followup in 6 months

## 2021-03-24 NOTE — Assessment & Plan Note (Signed)
Chronic cmp 

## 2021-03-24 NOTE — Assessment & Plan Note (Signed)
Chronic Sugars better at home He is taking his medication daily which he was not doing previously Will check a1c Continue jardiance 10 mg qd Continue 70/30 - 7 units q am, 40 units q pm

## 2021-03-24 NOTE — Assessment & Plan Note (Addendum)
Acute Pain is likely nerve related Started < 2 weeks ago ? glossophargneal nerve irritation, ? Related to clenching of jaw Start gabapentin 100 mg qam, 200 mg q HS - discussed side effects - can titrate if needed.  Consider seeing Dr Tat early for further evaluatioin

## 2021-03-25 ENCOUNTER — Telehealth: Payer: Self-pay | Admitting: Internal Medicine

## 2021-03-25 NOTE — Telephone Encounter (Signed)
    Patient returned call in regards to lab results

## 2021-03-25 NOTE — Telephone Encounter (Signed)
Returned call to patient.  If he calls back okay to let him know the following:  His blood counts and liver tests are normal. Kidney function is stable.  LDL (bad cholesterol) is well controlled.  A1c 9.2% - better controlled, but not controlled enough. Dr. Quay Burow wants him to make sure he is checking his sugars at home so we can adjust his insulin.  He can keep a log and call in and give [place] his numbers in a few days.

## 2021-04-23 ENCOUNTER — Encounter: Primary: Emergency Medicine

## 2021-04-28 ENCOUNTER — Encounter: Attending: Emergency Medicine | Primary: Emergency Medicine

## 2021-05-03 ENCOUNTER — Telehealth: Payer: Self-pay | Admitting: Internal Medicine

## 2021-05-03 NOTE — Telephone Encounter (Signed)
Left message for patient to call me back at [phone] to schedule Medicare Annual Wellness Visit   No hx of AWV eligible as of 06/12/16  Please schedule at anytime with [facility] Advisor if patient calls the office back.    79 Minutes appointment   Any questions, please call me at [phone]

## 2021-05-10 NOTE — Telephone Encounter (Signed)
LVM for patient to return call to discuss diabetic meds and to schedule appt. For f/u prior to [facility] 01/19/2021.

## 2021-05-13 NOTE — Telephone Encounter (Signed)
LVM for patient.

## 2021-05-18 NOTE — Telephone Encounter (Signed)
Spoke with patient who states that he stopped Jardiance, continues to take Ozempic. Jardiance not refilled.

## 2021-06-30 DIAGNOSIS — M25512 Pain in left shoulder: Secondary | ICD-10-CM | POA: Insufficient documentation

## 2021-07-06 NOTE — Telephone Encounter (Signed)
Our records show you are late for your JAR medication refill. Please call your pharmacy to request this medicine as soon as possible. If you need refills, please call Dr. Lannette Donath.  If you have picked up your medicine already or this medicine is discontinued, please disregard.  We appreciate you partnering with [place] for your healthcare needs.   [facility]

## 2021-07-12 ENCOUNTER — Telehealth: Payer: Self-pay | Admitting: Internal Medicine

## 2021-07-12 ENCOUNTER — Other Ambulatory Visit: Payer: Self-pay

## 2021-07-12 MED ORDER — HUMULIN 70/30 (70-30) 100 UNIT/ML ~~LOC~~ SUSP: SUBCUTANEOUS | 3 refills | Status: DC

## 2021-07-12 MED ORDER — "BD VEO INSULIN SYRINGE U/F 31G X 15/64"" 1 ML MISC": 2 refills | Status: AC

## 2021-07-12 NOTE — Telephone Encounter (Signed)
Sent in today 

## 2021-07-12 NOTE — Telephone Encounter (Signed)
1.Medication Requested:insulin NPH-regular Human (HUMULIN 70/30) (70-30) 100 UNIT/ML injection  BD VEO INSULIN SYR ULTRAFINE 31G X 15/64" 1 ML MISC  2. Pharmacy (Name, Street, [place]):  [place], [place] Phone:  [phone]  Fax:  [phone]      3. On Med List: y  9. Last Visit with PCP: 7.13.22  5. Next visit date with PCP: not scheduled yet  **Patient states he just needs one vial, [place]  Agent: Please be advised that RX refills may take up to 3 business days. We ask that you follow-up with your pharmacy.

## 2021-08-09 ENCOUNTER — Encounter: Payer: Self-pay | Admitting: Internal Medicine

## 2021-08-09 ENCOUNTER — Telehealth (INDEPENDENT_AMBULATORY_CARE_PROVIDER_SITE_OTHER): Payer: Medicare Other | Admitting: Internal Medicine

## 2021-08-09 ENCOUNTER — Telehealth: Payer: Self-pay | Admitting: Internal Medicine

## 2021-08-09 DIAGNOSIS — U071 COVID-19: Secondary | ICD-10-CM | POA: Insufficient documentation

## 2021-08-09 MED ORDER — HYDROCODONE BIT-HOMATROP MBR 5-1.5 MG/5ML PO SOLN: 5.0000 mL | Freq: Four times a day (QID) | ORAL | 0 refills | Status: DC | PRN

## 2021-08-09 MED ORDER — MOLNUPIRAVIR 200 MG PO CAPS: 4.0000 | ORAL_CAPSULE | Freq: Two times a day (BID) | ORAL | 0 refills | Status: AC

## 2021-08-09 NOTE — Telephone Encounter (Signed)
Left message for patient today.  VV scheduled for him at 3:40 today.   If he is not able to do appointment please reschedule him.  He will need appointment for any medications prescribed including anti-viral.

## 2021-08-09 NOTE — Assessment & Plan Note (Signed)
Acute Symptoms started 5 days ago Symptoms are mild-moderate and he is very high risk for complications Discussed antiviral medications-he is not a good candidate for paxlovid-has not had GFR in a while and his kidney function is reduced and also on several medications that interact with the medication Will initiate molnupiravir 800 mg twice daily x5 days Discussed symptomatic treatment-can continue TheraFlu, Tylenol as needed Will prescribe Hydromet cough syrup 5 mL every 6 hours as needed Rest, fluids No shortness of breath or wheezing at this time-advised that if he develops any concerning symptoms or has any questions he needs to call Questions answered

## 2021-08-09 NOTE — Progress Notes (Signed)
Virtual Visit via telephone Note  I connected with Russell Trevino on 08/09/21 at  3:40 PM EST by phone note and verified that I am speaking with the correct person using two identifiers.   I discussed the limitations of evaluation and management by telemedicine and the availability of in person appointments. The patient expressed understanding and agreed to proceed.  Present for the visit:  Myself, Dr Billey Gosling, Tammi Klippel.  The patient is currently at home and I am in the office.    No referring provider.    History of Present Illness: This is an acute visit for COVID.  His symptoms started about 5 days ago and he tested +3 days ago.  He states fever initially, which has subsided, fatigue, body aches, cough with sputum production that is getting worse, mild nasal congestion and sore throat.  He is concerned this will linger on for a while which is why he called today.  He denies any shortness of breath, wheezing or headaches.   Taking theraflu  Review of Systems  Constitutional:  Positive for fever and malaise/fatigue.  HENT:  Positive for congestion and sore throat.   Respiratory:  Positive for cough and sputum production. Negative for shortness of breath and wheezing.   Musculoskeletal:  Positive for myalgias.  Neurological:  Negative for dizziness and headaches.     Social History   Socioeconomic History   Marital status: Legally Separated    Spouse name: Not on file   Number of children: 4   Years of education: Not on file   Highest education level: Not on file  Occupational History   Not on file  Tobacco Use   Smoking status: Never   Smokeless tobacco: Never  Vaping Use   Vaping Use: Never used  Substance and Sexual Activity   Alcohol use: Yes    Alcohol/week: 2.0 standard drinks    Types: 2 Cans of beer per week   Drug use: No   Sexual activity: Not on file  Other Topics Concern   Not on file  Social History Narrative   Lives in [place], [place], has  family and friends in [place].   Drinking caffeine sodas 3-4 bottles a day   Right handed   Social Determinants of Health   Financial Resource Strain: Not on file  Food Insecurity: Not on file  Transportation Needs: Not on file  Physical Activity: Not on file  Stress: Not on file  Social Connections: Not on file    Lab Results  Component Value Date   WBC 7.3 03/24/2021   HGB 13.9 03/24/2021   HCT 40.2 03/24/2021   PLT 161.0 03/24/2021   GLUCOSE 276 (H) 03/24/2021   CHOL 139 03/24/2021   TRIG 251.0 (H) 03/24/2021   HDL 42.90 03/24/2021   LDLDIRECT 66.0 03/24/2021   LDLCALC 58 11/20/2019   ALT 19 03/24/2021   AST 16 03/24/2021   NA 136 03/24/2021   K 4.2 03/24/2021   CL 104 03/24/2021   CREATININE 1.72 (H) 03/24/2021   BUN 28 (H) 03/24/2021   CO2 24 03/24/2021   TSH 1.74 02/11/2019   PSA 4.43 (H) 05/26/2016   INR 1.06 10/07/2009   HGBA1C 9.2 (H) 03/24/2021   MICROALBUR 266.9 (H) 05/26/2016     Assessment and Plan:  See Problem List for Assessment and Plan of chronic medical problems.   Follow Up Instructions:    I discussed the assessment and treatment plan with the patient. The patient was provided  an opportunity to ask questions and all were answered. The patient agreed with the plan and demonstrated an understanding of the instructions.   The patient was advised to call back or seek an in-person evaluation if the symptoms worsen or if the condition fails to improve as anticipated.  Time spent on telephone call: 12 minutes  Binnie Rail, MD

## 2021-08-09 NOTE — Telephone Encounter (Signed)
Pt. Connected to [facility] 11.25.2022.   Caller states the tested himself and the patient tested positive for Covid. Patient stated that he has a heart condition. Patient stated he has nasal congestion, coughing, feels exhausted. CWBN calling back to check status and states his pharmacy closes at 6:00pm.   Caller states tested positive for Covid today. Nasal congestion, coughing, fatigue. no fever, no chills, no chest pain or dyspnea.   Advised to see HCP within 4 hours.

## 2021-08-13 NOTE — Progress Notes (Deleted)
Assessment/Plan:   1.  Parkinsonian tremor  -***   Subjective:   Russell Trevino was seen today in follow up for parkinsonian tremor.  I last saw him at the end of May.  At that point in time, he did not meet criteria for Parkinson's disease.  I was concerned that the metoclopramide could have been causing/contributing to symptoms.  I told him he needed to talk to his gastroenterologist in [place] and see if he was able to get off of that medicine.  I was able to review records from his primary care, but do not have records from gastroenterology.  I see that by the time he saw primary care in mid July, the patient was off of metoclopramide and stated that he no longer had tremor in the lower extremities, but still did in the upper extremity.  He reports today that ***.  As an aside, the patient did recently have COVID.  He was seen on video visit for this by primary care on November 28.  Current prescribed movement disorder medications: ***   PREVIOUS MEDICATIONS: {Parkinson's RX:18200}  ALLERGIES:  No Known Allergies  CURRENT MEDICATIONS:  Outpatient Encounter Medications as of 08/17/2021  Medication Sig   amLODipine (NORVASC) 5 MG tablet Take 1 tablet (5 mg total) by mouth daily.   aspirin 81 MG tablet Take 81 mg by mouth daily at 12 noon.    clopidogrel (PLAVIX) 75 MG tablet TAKE 1 TABLET(75 MG) BY MOUTH DAILY   empagliflozin (JARDIANCE) 10 MG TABS tablet Take 10 mg by mouth daily before breakfast.   ENTRESTO 49-51 MG TAKE 1 TABLET BY MOUTH TWICE DAILY   ezetimibe (ZETIA) 10 MG tablet Take 1 tablet (10 mg total) by mouth every evening.   finasteride (PROSCAR) 5 MG tablet TAKE 1 TABLET(5 MG) BY MOUTH DAILY   furosemide (LASIX) 80 MG tablet Take 1 tablet (80 mg total) by mouth every other day. TAKE ON EMPTY STOMACH (Patient not taking: Reported on 02/09/2021)   gabapentin (NEURONTIN) 100 MG capsule Take 1 cap in morning and 2 cap HS   HYDROcodone bit-homatropine (HYDROMET)  5-1.5 MG/5ML syrup Take 5 mLs by mouth every 6 (six) hours as needed for cough.   insulin NPH-regular Human (HUMULIN 70/30) (70-30) 100 UNIT/ML injection 75 units with breakfast, and 40 units with supper   Insulin Syringe-Needle U-100 (BD VEO INSULIN SYRINGE U/F) 31G X 15/64" 1 ML MISC use as directed TO INJECT INSULIN  twice a day   isosorbide mononitrate (IMDUR) 120 MG 24 hr tablet Take 1 tablet (120 mg total) by mouth 2 (two) times daily.   metoprolol succinate (TOPROL-XL) 50 MG 24 hr tablet Take 1 tablet (50 mg total) by mouth daily at 12 noon. Take with or immediately following a meal.   molnupiravir EUA (LAGEVRIO) 200 MG CAPS capsule Take 4 capsules (800 mg total) by mouth 2 (two) times daily for 5 days.   nitroGLYCERIN (NITROSTAT) 0.4 MG SL tablet DISSOLVE 1 TABLET UNDER THE TONGUE EVERY 5 MINUTES AS NEEDED FOR CHEST PAIN. CALL MD/ 911 IF TAKE 2 DOSES. MAX 3 DOSES PER DAY (Patient not taking: Reported on 02/09/2021)   pantoprazole (PROTONIX) 40 MG tablet TAKE 1 TABLET(40 MG) BY MOUTH TWICE DAILY   PEPCID 40 MG tablet Take 40 mg by mouth at bedtime.   potassium chloride SA (K-DUR) 20 MEQ tablet Take 1 tablet (20 mEq total) by mouth daily as needed (when you take lasix).   ranolazine (RANEXA) 500  MG 12 hr tablet Take 1 tablet (500 mg total) by mouth 2 (two) times daily.   rosuvastatin (CRESTOR) 40 MG tablet TAKE 1 TABLET(40 MG) BY MOUTH DAILY   No facility-administered encounter medications on file as of 08/17/2021.    Objective:   PHYSICAL EXAMINATION:    VITALS:  There were no vitals filed for this visit.  GEN:  The patient appears stated age and is in NAD. HEENT:  Normocephalic, atraumatic.  The mucous membranes are moist. The superficial temporal arteries are without ropiness or tenderness. CV:  RRR Lungs:  CTAB Neck/HEME:  There are no carotid bruits bilaterally.  Neurological examination:  Orientation: The patient is alert and oriented x3. Cranial nerves: There is good facial  symmetry with*** facial hypomimia. The speech is fluent and clear. Soft palate rises symmetrically and there is no tongue deviation. Hearing is intact to conversational tone. Sensation: Sensation is intact to light touch throughout Motor: Strength is at least antigravity x4.  Movement examination: Tone: There is nl tone in the bilateral upper extremities.  The tone in the lower extremities is nl.  There is right upper extremity rest tremor with ambulation. Abnormal movements: there is bilateral UE and RLE resting tremor that increases with distraction Coordination:  There is some slowness with rapid alternating movements, but no true decremation. Gait and Station: The patient has no difficulty arising out of a deep-seated chair without the use of the hands. The patient's stride length is good, but he has right upper extremity rest tremor with ambulation.    I have reviewed and interpreted the following labs independently    Chemistry      Component Value Date/Time   NA 136 03/24/2021 0859   NA 143 08/30/2019 1537   K 4.2 03/24/2021 0859   CL 104 03/24/2021 0859   CO2 24 03/24/2021 0859   BUN 28 (H) 03/24/2021 0859   BUN 33 (H) 08/30/2019 1537   CREATININE 1.72 (H) 03/24/2021 0859      Component Value Date/Time   CALCIUM 9.2 03/24/2021 0859   ALKPHOS 60 03/24/2021 0859   AST 16 03/24/2021 0859   ALT 19 03/24/2021 0859   BILITOT 1.4 (H) 03/24/2021 0859       Lab Results  Component Value Date   WBC 7.3 03/24/2021   HGB 13.9 03/24/2021   HCT 40.2 03/24/2021   MCV 88.8 03/24/2021   PLT 161.0 03/24/2021    Lab Results  Component Value Date   TSH 1.74 02/11/2019     Total time spent on today's visit was ***30 minutes, including both face-to-face time and nonface-to-face time.  Time included that spent on review of records (prior notes available to me/labs/imaging if pertinent), discussing treatment and goals, answering patient's questions and coordinating care.  Cc:   Binnie Rail, MD

## 2021-08-17 ENCOUNTER — Encounter: Payer: Self-pay | Admitting: Internal Medicine

## 2021-08-17 ENCOUNTER — Ambulatory Visit: Payer: Medicare Other | Admitting: Neurology

## 2021-08-17 NOTE — Patient Instructions (Addendum)
     Medications changes include :   hydromet cough syrup.  Doxycycline.  Your prescription(s) have been submitted to your pharmacy. Please take as directed and contact our office if you believe you are having problem(s) with the medication(s).

## 2021-08-17 NOTE — Progress Notes (Signed)
Subjective:    Patient ID: Russell Trevino, male    DOB: [birth year], 70 y.o.   MRN: [id number]  This visit occurred during the SARS-CoV-2 public health emergency.  Safety protocols were in place, including screening questions prior to the visit, additional usage of staff PPE, and extensive cleaning of exam room while observing appropriate contact time as indicated for disinfecting solutions.    HPI The patient is here for an acute visit.  Covid symptoms started 11/23, tested positive 11/25. VV with me on 11/28.  His symptoms at that time were fatigue, body aches, productive cough, nasal congestion and st.  No SOB.  Prescribed molnupiravir and hydromet.  He is not feeling better.    He has a feeling of his head being full and some chest congestion. He is coughing up some mucus.  He is taking theraflu.  The symptoms do not seem to be improving and may be getting worse.  He denies fever.    Covid test at home is negative now.   He has lost weight - maybe 20 lbs over the past 2 weeks - he has not been able to eat with covid.  His appetite is improving.    Medications and allergies reviewed with patient and updated if appropriate.  Patient Active Problem List   Diagnosis Date Noted   COVID 08/09/2021   Ear pain, left 03/24/2021   Gastroparesis due to DM ([place]) 11/03/2020   Benign essential tremor 11/03/2020   Acute cystitis without hematuria 03/24/2020   BPH (benign prostatic hyperplasia) 11/20/2019   CKD (chronic kidney disease) stage 4, GFR 15-29 ml/min (HCC) 08/04/2019   Coronary artery disease involving native coronary artery of native heart with angina pectoris ([place]) 03/04/2019   CKD stage 3 due to type 2 diabetes mellitus ([place]) 01/28/2019   Ischemic cardiomyopathy 11/05/2018   Chronic combined systolic and diastolic heart failure ([place]) 11/05/2018   Diabetes mellitus ([place]) 08/26/2017   GERD (gastroesophageal reflux disease) 07/13/2017   Thoracic back pain 06/21/2016   Hx  of CABG 05/26/2016   Prostate cancer ([place]) 05/26/2016   Essential hypertension, benign 05/26/2016   Mixed hyperlipidemia 05/26/2016   Coronary artery disease due to calcified coronary lesion 03/25/2013    Current Outpatient Medications on File Prior to Visit  Medication Sig Dispense Refill   aspirin 81 MG tablet Take 81 mg by mouth daily at 12 noon.      clopidogrel (PLAVIX) 75 MG tablet TAKE 1 TABLET(75 MG) BY MOUTH DAILY 30 tablet 0   empagliflozin (JARDIANCE) 10 MG TABS tablet Take 10 mg by mouth daily before breakfast. 30 tablet 5   ENTRESTO 49-51 MG TAKE 1 TABLET BY MOUTH TWICE DAILY 60 tablet 0   ezetimibe (ZETIA) 10 MG tablet Take 1 tablet (10 mg total) by mouth every evening. 90 tablet 1   finasteride (PROSCAR) 5 MG tablet TAKE 1 TABLET(5 MG) BY MOUTH DAILY 90 tablet 1   furosemide (LASIX) 80 MG tablet Take 1 tablet (80 mg total) by mouth every other day. TAKE ON EMPTY STOMACH 90 tablet 3   gabapentin (NEURONTIN) 100 MG capsule Take 1 cap in morning and 2 cap HS 90 capsule 3   HYDROcodone bit-homatropine (HYDROMET) 5-1.5 MG/5ML syrup Take 5 mLs by mouth every 6 (six) hours as needed for cough. 120 mL 0   insulin NPH-regular Human (HUMULIN 70/30) (70-30) 100 UNIT/ML injection 75 units with breakfast, and 40 units with supper 50 mL 3   Insulin Syringe-Needle U-100 (BD  VEO INSULIN SYRINGE U/F) 31G X 15/64" 1 ML MISC use as directed TO INJECT INSULIN  twice a day 10 each 2   isosorbide mononitrate (IMDUR) 120 MG 24 hr tablet Take 1 tablet (120 mg total) by mouth 2 (two) times daily. 180 tablet 3   metoprolol succinate (TOPROL-XL) 50 MG 24 hr tablet Take 1 tablet (50 mg total) by mouth daily at 12 noon. Take with or immediately following a meal. 90 tablet 3   nitroGLYCERIN (NITROSTAT) 0.4 MG SL tablet DISSOLVE 1 TABLET UNDER THE TONGUE EVERY 5 MINUTES AS NEEDED FOR CHEST PAIN. CALL MD/ 911 IF TAKE 2 DOSES. MAX 3 DOSES PER DAY 100 tablet 0   pantoprazole (PROTONIX) 40 MG tablet TAKE 1  TABLET(40 MG) BY MOUTH TWICE DAILY 90 tablet 3   PEPCID 40 MG tablet Take 40 mg by mouth at bedtime.     potassium chloride SA (K-DUR) 20 MEQ tablet Take 1 tablet (20 mEq total) by mouth daily as needed (when you take lasix). 90 tablet 1   ranolazine (RANEXA) 500 MG 12 hr tablet Take 1 tablet (500 mg total) by mouth 2 (two) times daily. 180 tablet 3   rosuvastatin (CRESTOR) 40 MG tablet TAKE 1 TABLET(40 MG) BY MOUTH DAILY 90 tablet 3   amLODipine (NORVASC) 5 MG tablet Take 1 tablet (5 mg total) by mouth daily. 90 tablet 3   No current facility-administered medications on file prior to visit.    Past Medical History:  Diagnosis Date   BPH (benign prostatic hypertrophy)    Cardiomyopathy, ischemic    Coronary artery disease    at [facility] [place] 2009 (GRAFTS PATENT), cath 05/2018 w/ SVG-PDA 100%>>med rx   Diabetes mellitus, type 2 (HCC)    Frequency of urination    GERD (gastroesophageal reflux disease)    Gross hematuria    History of CHF (congestive heart failure) 2007   History of kidney stones    History of myocardial infarction 2006, 2019   2006>>CABG, 2019 cath w/ med rx   History of urinary retention    Hyperlipidemia    Nocturia    Renal calculi    BILATERAL PER CT SCAN--  NON-OBSTRUCTIVE   S/P CABG x 4 2006    Past Surgical History:  Procedure Laterality Date   CARDIAC CATHETERIZATION  01/17/2008   GRAFTS PATENT / EF 40%,  DR SANTOS ([facility])   CORONARY ARTERY BYPASS GRAFT  04/2005   LIMA-LAD, L radial-OM-D1 and SVG-PDA   CYSTO / RIGHT URETERAL STENT PLACEMENT  10-08-1999   CYSTO/  REMOVAL BLADDER STONES  01-14-2010   CYSTOSCOPY  10/16/2012   Procedure: CYSTOSCOPY;  Surgeon: Malka So, MD;  Location: [facility];  Service: Urology;  Laterality: N/A;  Cystoscopy with Clot Evacuation and fulguration bladder neck.   EXTRACORPOREAL SHOCK WAVE LITHOTRIPSY  10-15-2009   RIGHT   KNEE ARTHROSCOPY  1978   RIGHT   LEFT SHOULDER SURGERY   2002   LUMBAR FUSION   09-10-2008   L4 - L5   PERCUTANEOUS NEPHROSTOLITHOTOMY  1979   PROSTATE BIOPSY  10/16/2012   Procedure: PROSTATE BIOPSY;  Surgeon: Malka So, MD;  Location: [facility];  Service: Urology;  Laterality: N/A;  Through ultrasound.   RIGHT/LEFT HEART CATH AND CORONARY/GRAFT ANGIOGRAPHY N/A 10/26/2018   Procedure: RIGHT/LEFT HEART CATH AND CORONARY/GRAFT ANGIOGRAPHY;  Surgeon: Martinique, Peter M, MD;  Location: [facility];  Service: Cardiovascular;  Laterality: N/A;   SHOULDER ARTHROSCOPY W/  SUBACROMIAL DECOMPRESSION AND DISTAL CLAVICLE EXCISION  10-11-2004   AND PARTIAL ROTATOR CUFF REPAIR   TRANSTHORACIC ECHOCARDIOGRAM  27/11/5007   LV SYSTOLIC FUNCTION MILDLY REDUCED/ EF 48%/ LV FILLING PATTERN  IS IMPAIRED/ HYPOKINESIS IN THE MID AND BASALAR SEPTUM & ANTEROSEPTUM   TURP VAPORIZATION  01-19-2010   BPH W/ BOO   URETEROLITHOTOMY  1976    Social History   Socioeconomic History   Marital status: Legally Separated    Spouse name: Not on file   Number of children: 4   Years of education: Not on file   Highest education level: Not on file  Occupational History   Not on file  Tobacco Use   Smoking status: Never   Smokeless tobacco: Never  Vaping Use   Vaping Use: Never used  Substance and Sexual Activity   Alcohol use: Yes    Alcohol/week: 2.0 standard drinks    Types: 2 Cans of beer per week   Drug use: No   Sexual activity: Not on file  Other Topics Concern   Not on file  Social History Narrative   Lives in [place], [place], has family and friends in [place].   Drinking caffeine sodas 3-4 bottles a day   Right handed   Social Determinants of Health   Financial Resource Strain: Not on file  Food Insecurity: Not on file  Transportation Needs: Not on file  Physical Activity: Not on file  Stress: Not on file  Social Connections: Not on file    Family History  Problem Relation Age of Onset   Lung cancer Mother    Diabetes Father    Stomach cancer Neg Hx     Colon cancer Neg Hx    Rectal cancer Neg Hx    Esophageal cancer Neg Hx    Pancreatic cancer Neg Hx     Review of Systems  Constitutional:  Negative for fever.  HENT:  Positive for congestion. Negative for ear pain, sinus pain and sore throat.   Respiratory:  Positive for cough (productive of yellow mucus). Negative for chest tightness, shortness of breath and wheezing.   Gastrointestinal:  Negative for diarrhea and nausea.  Musculoskeletal:  Positive for myalgias (mild).  Neurological:  Positive for light-headedness and headaches (occ). Negative for dizziness.      Objective:   Vitals:   08/18/21 0907  BP: 132/72  Pulse: 97  Temp: 98.4 F (36.9 C)  SpO2: 98%   BP Readings from Last 3 Encounters:  08/18/21 132/72  03/24/21 136/70  02/09/21 126/60   Wt Readings from Last 3 Encounters:  08/18/21 232 lb (105.2 kg)  03/24/21 245 lb (111.1 kg)  02/09/21 247 lb (112 kg)   Body mass index is 31.46 kg/m.   Physical Exam    GENERAL APPEARANCE: Appears stated age, well appearing, NAD EYES: conjunctiva clear, no icterus HENT: bilateral tympanic membranes and ear canals normal, oropharynx with no erythema or exudates, trachea midline, no cervical or supraclavicular lymphadenopathy LUNGS: Unlabored breathing, good air entry bilaterally, clear to auscultation without wheeze or crackles CARDIOVASCULAR: Normal S1,S2 , no edema SKIN: Warm, dry      Assessment & Plan:    See Problem List for Assessment and Plan of chronic medical problems.

## 2021-08-18 ENCOUNTER — Other Ambulatory Visit: Payer: Self-pay

## 2021-08-18 ENCOUNTER — Ambulatory Visit (INDEPENDENT_AMBULATORY_CARE_PROVIDER_SITE_OTHER): Payer: Medicare Other | Admitting: Internal Medicine

## 2021-08-18 DIAGNOSIS — J209 Acute bronchitis, unspecified: Secondary | ICD-10-CM | POA: Diagnosis not present

## 2021-08-18 MED ORDER — HYDROCODONE BIT-HOMATROP MBR 5-1.5 MG/5ML PO SOLN
5.0000 mL | Freq: Four times a day (QID) | ORAL | 0 refills | Status: DC | PRN
Start: 2021-08-18 — End: 2021-12-23

## 2021-08-18 MED ORDER — DOXYCYCLINE HYCLATE 100 MG PO TABS: 100.0000 mg | ORAL_TABLET | Freq: Two times a day (BID) | ORAL | 0 refills | Status: AC

## 2021-08-18 NOTE — Assessment & Plan Note (Signed)
Acute Concern for secondary infection post COVID Not improving conservative measures-concern for bacterial cause Start doxycycline 100 twice daily x10 days Hydromet cough syrup Stressed making sure your sugars are well controlled-he deferred having that checked today Can continue over-the-counter cold medications including Mucinex Increase fluids Call if no improvement

## 2021-08-23 NOTE — Telephone Encounter (Signed)
Our records show you are late for your ROS medication refill. Please call your pharmacy to request this medicine as soon as possible. If you need refills, please call Dr. Marzetta Board BURNS.    If you have picked up your medicine already or this medicine is discontinued, please disregard.

## 2021-09-01 ENCOUNTER — Telehealth: Payer: Self-pay | Admitting: Internal Medicine

## 2021-09-01 MED ORDER — ONDANSETRON HCL 4 MG PO TABS: 4.0000 mg | ORAL_TABLET | Freq: Three times a day (TID) | ORAL | 0 refills | Status: DC | PRN

## 2021-09-01 NOTE — Telephone Encounter (Signed)
I sent in a few days of zofran

## 2021-09-01 NOTE — Telephone Encounter (Signed)
Patient calling in  Patient says he has not been able to keep food down for about 2 days.. says he is not experiencing any other symptoms  Wants to know provider recommendations for OTC [phone]

## 2021-09-01 NOTE — Telephone Encounter (Signed)
Patient informed. 

## 2021-09-27 ENCOUNTER — Encounter: Payer: Self-pay | Admitting: Cardiology

## 2021-09-28 DIAGNOSIS — R195 Other fecal abnormalities: Secondary | ICD-10-CM | POA: Diagnosis not present

## 2021-09-28 DIAGNOSIS — D122 Benign neoplasm of ascending colon: Secondary | ICD-10-CM | POA: Diagnosis not present

## 2021-09-28 DIAGNOSIS — D123 Benign neoplasm of transverse colon: Secondary | ICD-10-CM | POA: Diagnosis not present

## 2021-09-28 DIAGNOSIS — I10 Essential (primary) hypertension: Secondary | ICD-10-CM | POA: Diagnosis not present

## 2021-09-28 DIAGNOSIS — Z1211 Encounter for screening for malignant neoplasm of colon: Secondary | ICD-10-CM | POA: Diagnosis not present

## 2021-09-28 DIAGNOSIS — E119 Type 2 diabetes mellitus without complications: Secondary | ICD-10-CM | POA: Diagnosis not present

## 2021-09-28 DIAGNOSIS — K635 Polyp of colon: Secondary | ICD-10-CM | POA: Diagnosis not present

## 2021-10-14 ENCOUNTER — Other Ambulatory Visit: Payer: Self-pay | Admitting: Cardiology

## 2021-10-14 DIAGNOSIS — I5042 Chronic combined systolic (congestive) and diastolic (congestive) heart failure: Secondary | ICD-10-CM

## 2021-10-14 NOTE — Telephone Encounter (Signed)
Rx(s) sent to pharmacy electronically.  

## 2021-11-11 NOTE — Telephone Encounter (Signed)
Hi Alfred Weiss,    Our records show you are late for your OZE  medication refill. Please call your pharmacy to request this medicine as soon as possible. If you need refills, please call Dr. Karen Kitchens.    If you have picked up your medicine already or this medicine is discontinued, please disregard.    We appreciate you partnering with [place] for your healthcare needs.   [facility]

## 2021-11-18 DIAGNOSIS — R0989 Other specified symptoms and signs involving the circulatory and respiratory systems: Secondary | ICD-10-CM | POA: Diagnosis not present

## 2021-11-18 DIAGNOSIS — J018 Other acute sinusitis: Secondary | ICD-10-CM | POA: Diagnosis not present

## 2021-11-18 DIAGNOSIS — R059 Cough, unspecified: Secondary | ICD-10-CM | POA: Diagnosis not present

## 2021-12-16 ENCOUNTER — Observation Stay (HOSPITAL_COMMUNITY)
Admission: EM | Admit: 2021-12-16 | Discharge: 2021-12-17 | Disposition: A | Discharge status: Home / Self Care | Payer: Medicare Other | Attending: Emergency Medicine | Admitting: Emergency Medicine

## 2021-12-16 ENCOUNTER — Emergency Department (HOSPITAL_COMMUNITY): Payer: Medicare Other

## 2021-12-16 ENCOUNTER — Encounter (HOSPITAL_COMMUNITY): Payer: Self-pay

## 2021-12-16 ENCOUNTER — Encounter

## 2021-12-16 DIAGNOSIS — Z7984 Long term (current) use of oral hypoglycemic drugs: Secondary | ICD-10-CM | POA: Insufficient documentation

## 2021-12-16 DIAGNOSIS — I5020 Unspecified systolic (congestive) heart failure: Secondary | ICD-10-CM | POA: Diagnosis not present

## 2021-12-16 DIAGNOSIS — E1169 Type 2 diabetes mellitus with other specified complication: Secondary | ICD-10-CM

## 2021-12-16 DIAGNOSIS — R079 Chest pain, unspecified: Secondary | ICD-10-CM | POA: Diagnosis not present

## 2021-12-16 DIAGNOSIS — I152 Hypertension secondary to endocrine disorders: Secondary | ICD-10-CM | POA: Diagnosis not present

## 2021-12-16 DIAGNOSIS — E1165 Type 2 diabetes mellitus with hyperglycemia: Secondary | ICD-10-CM | POA: Insufficient documentation

## 2021-12-16 DIAGNOSIS — E785 Hyperlipidemia, unspecified: Secondary | ICD-10-CM | POA: Diagnosis not present

## 2021-12-16 DIAGNOSIS — E1122 Type 2 diabetes mellitus with diabetic chronic kidney disease: Secondary | ICD-10-CM | POA: Diagnosis not present

## 2021-12-16 DIAGNOSIS — I252 Old myocardial infarction: Secondary | ICD-10-CM | POA: Insufficient documentation

## 2021-12-16 DIAGNOSIS — Z951 Presence of aortocoronary bypass graft: Secondary | ICD-10-CM | POA: Insufficient documentation

## 2021-12-16 DIAGNOSIS — I25110 Atherosclerotic heart disease of native coronary artery with unstable angina pectoris: Secondary | ICD-10-CM | POA: Diagnosis not present

## 2021-12-16 DIAGNOSIS — E1159 Type 2 diabetes mellitus with other circulatory complications: Secondary | ICD-10-CM

## 2021-12-16 DIAGNOSIS — I5023 Acute on chronic systolic (congestive) heart failure: Secondary | ICD-10-CM | POA: Diagnosis not present

## 2021-12-16 DIAGNOSIS — R0789 Other chest pain: Secondary | ICD-10-CM | POA: Diagnosis not present

## 2021-12-16 DIAGNOSIS — I38 Endocarditis, valve unspecified: Secondary | ICD-10-CM | POA: Diagnosis not present

## 2021-12-16 DIAGNOSIS — Z794 Long term (current) use of insulin: Secondary | ICD-10-CM | POA: Diagnosis not present

## 2021-12-16 DIAGNOSIS — I2510 Atherosclerotic heart disease of native coronary artery without angina pectoris: Secondary | ICD-10-CM | POA: Diagnosis present

## 2021-12-16 DIAGNOSIS — Z7982 Long term (current) use of aspirin: Secondary | ICD-10-CM | POA: Diagnosis not present

## 2021-12-16 DIAGNOSIS — Z79899 Other long term (current) drug therapy: Secondary | ICD-10-CM | POA: Diagnosis not present

## 2021-12-16 DIAGNOSIS — R0602 Shortness of breath: Secondary | ICD-10-CM | POA: Diagnosis not present

## 2021-12-16 DIAGNOSIS — N1832 Chronic kidney disease, stage 3b: Secondary | ICD-10-CM | POA: Diagnosis not present

## 2021-12-16 DIAGNOSIS — I25119 Atherosclerotic heart disease of native coronary artery with unspecified angina pectoris: Secondary | ICD-10-CM

## 2021-12-16 DIAGNOSIS — Z7902 Long term (current) use of antithrombotics/antiplatelets: Secondary | ICD-10-CM | POA: Insufficient documentation

## 2021-12-16 DIAGNOSIS — I130 Hypertensive heart and chronic kidney disease with heart failure and stage 1 through stage 4 chronic kidney disease, or unspecified chronic kidney disease: Secondary | ICD-10-CM | POA: Diagnosis not present

## 2021-12-16 DIAGNOSIS — I5042 Chronic combined systolic (congestive) and diastolic (congestive) heart failure: Secondary | ICD-10-CM

## 2021-12-16 DIAGNOSIS — I200 Unstable angina: Secondary | ICD-10-CM | POA: Diagnosis not present

## 2021-12-16 LAB — BASIC METABOLIC PANEL
Anion gap: 12 (ref 5–15)
BUN: 38 mg/dL — ABNORMAL HIGH (ref 8–23)
CO2: 22 mmol/L (ref 22–32)
Calcium: 9.8 mg/dL (ref 8.9–10.3)
Chloride: 101 mmol/L (ref 98–111)
Creatinine, Ser: 2.05 mg/dL — ABNORMAL HIGH (ref 0.61–1.24)
GFR, Estimated: 34 mL/min — ABNORMAL LOW (ref >60)
Glucose, Bld: 163 mg/dL — ABNORMAL HIGH (ref 70–99)
Potassium: 4 mmol/L (ref 3.5–5.1)
Sodium: 135 mmol/L (ref 135–145)

## 2021-12-16 LAB — HEPATIC FUNCTION PANEL
ALT: 20 U/L (ref 0–44)
AST: 18 U/L (ref 15–41)
Albumin: 3.5 g/dL (ref 3.5–5.0)
Alkaline Phosphatase: 64 U/L (ref 38–126)
Bilirubin, Direct: 0.2 mg/dL (ref 0.0–0.2)
Indirect Bilirubin: 1.1 mg/dL — ABNORMAL HIGH (ref 0.3–0.9)
Total Bilirubin: 1.3 mg/dL — ABNORMAL HIGH (ref 0.3–1.2)
Total Protein: 6.7 g/dL (ref 6.5–8.1)

## 2021-12-16 LAB — LIPASE, BLOOD: Lipase: 31 U/L (ref 11–51)

## 2021-12-16 LAB — CBC
HCT: 42.1 % (ref 39.0–52.0)
Hemoglobin: 15 g/dL (ref 13.0–17.0)
MCH: 30.9 pg (ref 26.0–34.0)
MCHC: 35.6 g/dL (ref 30.0–36.0)
MCV: 86.8 fL (ref 80.0–100.0)
Platelets: 208 10*3/uL (ref 150–400)
RBC: 4.85 MIL/uL (ref 4.22–5.81)
RDW: 13.1 % (ref 11.5–15.5)
WBC: 11.4 10*3/uL — ABNORMAL HIGH (ref 4.0–10.5)
nRBC: 0 % (ref 0.0–0.2)

## 2021-12-16 LAB — GLUCOSE, CAPILLARY: Glucose-Capillary: 266 mg/dL — ABNORMAL HIGH (ref 70–99)

## 2021-12-16 LAB — CBG MONITORING, ED: Glucose-Capillary: 167 mg/dL — ABNORMAL HIGH (ref 70–99)

## 2021-12-16 LAB — TROPONIN I (HIGH SENSITIVITY)
Troponin I (High Sensitivity): 19 ng/L — ABNORMAL HIGH (ref <18)
Troponin I (High Sensitivity): 16 ng/L (ref <18)

## 2021-12-16 MED ORDER — INSULIN ASPART 100 UNIT/ML IJ SOLN
0.0000 [IU] | Freq: Three times a day (TID) | INTRAMUSCULAR | Status: DC
  Administered 2021-12-17: 3 [IU] via SUBCUTANEOUS
  Administered 2021-12-17: 5 [IU] via SUBCUTANEOUS

## 2021-12-16 MED ORDER — ONDANSETRON HCL 4 MG/2ML IJ SOLN: 4.0000 mg | Freq: Four times a day (QID) | INTRAMUSCULAR | Status: DC | PRN

## 2021-12-16 MED ORDER — ASPIRIN 300 MG RE SUPP: 300.0000 mg | RECTAL | Status: AC

## 2021-12-16 MED ORDER — SACUBITRIL-VALSARTAN 49-51 MG PO TABS
1.0000 | ORAL_TABLET | Freq: Two times a day (BID) | ORAL | Status: DC
  Administered 2021-12-16 – 2021-12-17 (×2): 1 via ORAL
  Filled 2021-12-16 (×3): qty 1

## 2021-12-16 MED ORDER — ASPIRIN 81 MG PO CHEW
243.0000 mg | CHEWABLE_TABLET | Freq: Once | ORAL | Status: AC
  Administered 2021-12-16: 243 mg via ORAL
  Filled 2021-12-16: qty 3

## 2021-12-16 MED ORDER — HEPARIN SODIUM (PORCINE) 5000 UNIT/ML IJ SOLN
5000.0000 [IU] | Freq: Three times a day (TID) | INTRAMUSCULAR | Status: DC
  Administered 2021-12-16 – 2021-12-17 (×2): 5000 [IU] via SUBCUTANEOUS
  Filled 2021-12-16 (×2): qty 1

## 2021-12-16 MED ORDER — SODIUM CHLORIDE 0.9 % WEIGHT BASED INFUSION
3.0000 mL/kg/h | INTRAVENOUS | Status: DC
  Administered 2021-12-17: 3 mL/kg/h via INTRAVENOUS

## 2021-12-16 MED ORDER — ACETAMINOPHEN 325 MG PO TABS: 650.0000 mg | ORAL_TABLET | ORAL | Status: DC | PRN

## 2021-12-16 MED ORDER — SODIUM CHLORIDE 0.9 % WEIGHT BASED INFUSION
1.0000 mL/kg/h | INTRAVENOUS | Status: DC
  Administered 2021-12-17: 1 mL/kg/h via INTRAVENOUS

## 2021-12-16 MED ORDER — PANTOPRAZOLE SODIUM 40 MG PO TBEC
40.0000 mg | DELAYED_RELEASE_TABLET | Freq: Every day | ORAL | Status: DC
  Administered 2021-12-16 – 2021-12-17 (×2): 40 mg via ORAL
  Filled 2021-12-16 (×2): qty 1

## 2021-12-16 MED ORDER — ISOSORBIDE MONONITRATE ER 60 MG PO TB24
120.0000 mg | ORAL_TABLET | Freq: Two times a day (BID) | ORAL | Status: DC
  Administered 2021-12-16 – 2021-12-17 (×2): 120 mg via ORAL
  Filled 2021-12-16 (×2): qty 2

## 2021-12-16 MED ORDER — METOPROLOL SUCCINATE ER 50 MG PO TB24
50.0000 mg | ORAL_TABLET | Freq: Every day | ORAL | Status: DC
  Administered 2021-12-17: 50 mg via ORAL
  Filled 2021-12-16 (×2): qty 1

## 2021-12-16 MED ORDER — ASPIRIN 81 MG PO CHEW
324.0000 mg | CHEWABLE_TABLET | ORAL | Status: AC
  Administered 2021-12-16: 324 mg via ORAL
  Filled 2021-12-16: qty 4

## 2021-12-16 MED ORDER — ASPIRIN 81 MG PO CHEW
81.0000 mg | CHEWABLE_TABLET | Freq: Every day | ORAL | Status: DC
  Administered 2021-12-17: 81 mg via ORAL
  Filled 2021-12-16: qty 1

## 2021-12-16 MED ORDER — CLOPIDOGREL BISULFATE 75 MG PO TABS
75.0000 mg | ORAL_TABLET | Freq: Every day | ORAL | Status: DC
  Administered 2021-12-17: 75 mg via ORAL
  Filled 2021-12-16: qty 1

## 2021-12-16 MED ORDER — EZETIMIBE 10 MG PO TABS
10.0000 mg | ORAL_TABLET | Freq: Every evening | ORAL | Status: DC
  Administered 2021-12-16: 10 mg via ORAL
  Filled 2021-12-16: qty 1

## 2021-12-16 MED ORDER — NITROGLYCERIN 0.4 MG SL SUBL: 0.4000 mg | SUBLINGUAL_TABLET | SUBLINGUAL | Status: DC | PRN

## 2021-12-16 MED ORDER — CLOPIDOGREL BISULFATE 75 MG PO TABS: 75.0000 mg | ORAL_TABLET | Freq: Every day | ORAL | Status: DC

## 2021-12-16 MED ORDER — ROSUVASTATIN CALCIUM 20 MG PO TABS
40.0000 mg | ORAL_TABLET | Freq: Every day | ORAL | Status: DC
  Administered 2021-12-16 – 2021-12-17 (×2): 40 mg via ORAL
  Filled 2021-12-16 (×2): qty 2

## 2021-12-16 NOTE — ED Provider Notes (Signed)
?[facility] ?Provider Note ? ? ?CSN: [id number] ?Arrival date & time: 12/16/21  1606 ? ?  ? ?History ? ?Chief Complaint  ?Patient presents with  ? Chest Pain  ? Shortness of Breath  ? ? ?Russell Trevino is a 71 y.o. male who presents today for evaluation of generally feeling poorly along with associated chest pain, shortness of breath, nausea.   ? ?He states that his symptoms started suddenly this morning at about 11 when he was in the kitchen getting ready to eat.  He initially thought that possibly he was hypoglycemic and took sugar tabs and sat down however that did not change his symptoms. ?He states that he has taken 3 nitro prior to arrival significantly spaced out since his symptoms started and states that he feels like it "took the pressure off.". ?He states that his chest pain is intermittent, will last for a few seconds before going away.  He last had chest pain when he arrived and at the time of my initial evaluation has been here for about an hour and 45 minutes. ?He currently denies any chest pain stating that he still just feels poorly and nauseated.  He has dry heaves however has not vomited.  He denies any abdominal pain. ? ? ? ?HPI ? ?  ? ?Home Medications ?Prior to Admission medications   ?Medication Sig Start Date End Date Taking? Authorizing Provider  ?amLODipine (NORVASC) 5 MG tablet Take 1 tablet (5 mg total) by mouth daily. 05/13/19 05/07/20  Buford Dresser, MD  ?aspirin 81 MG tablet Take 81 mg by mouth daily at 12 noon.     [provider]  ?clopidogrel (PLAVIX) 75 MG tablet TAKE 1 TABLET(75 MG) BY MOUTH DAILY 12/29/20   Buford Dresser, MD  ?empagliflozin (JARDIANCE) 10 MG TABS tablet Take 10 mg by mouth daily before breakfast. 11/20/19   Binnie Rail, MD  ?ENTRESTO 49-51 MG TAKE 1 TABLET BY MOUTH TWICE DAILY 12/29/20   Buford Dresser, MD  ?ezetimibe (ZETIA) 10 MG tablet Take 1 tablet (10 mg total) by mouth every evening.  06/25/19   Buford Dresser, MD  ?finasteride (PROSCAR) 5 MG tablet TAKE 1 TABLET(5 MG) BY MOUTH DAILY 10/15/20   Binnie Rail, MD  ?furosemide (LASIX) 80 MG tablet TAKE 1 TABLET(80 MG) BY MOUTH DAILY ON AN EMPTY STOMACH 10/14/21   Buford Dresser, MD  ?gabapentin (NEURONTIN) 100 MG capsule Take 1 cap in morning and 2 cap HS 03/24/21   Burns, Claudina Lick, MD  ?HYDROcodone bit-homatropine (HYDROMET) 5-1.5 MG/5ML syrup Take 5 mLs by mouth every 6 (six) hours as needed for cough. 08/18/21   Binnie Rail, MD  ?insulin NPH-regular Human (HUMULIN 70/30) (70-30) 100 UNIT/ML injection 75 units with breakfast, and 40 units with supper 07/12/21   Binnie Rail, MD  ?Insulin Syringe-Needle U-100 (BD VEO INSULIN SYRINGE U/F) 31G X 15/64" 1 ML MISC use as directed TO INJECT INSULIN  twice a day 07/12/21   Binnie Rail, MD  ?isosorbide mononitrate (IMDUR) 120 MG 24 hr tablet Take 1 tablet (120 mg total) by mouth 2 (two) times daily. 08/30/19   Buford Dresser, MD  ?metoprolol succinate (TOPROL-XL) 50 MG 24 hr tablet Take 1 tablet (50 mg total) by mouth daily at 12 noon. Take with or immediately following a meal. 01/31/19   Buford Dresser, MD  ?nitroGLYCERIN (NITROSTAT) 0.4 MG SL tablet DISSOLVE 1 TABLET UNDER THE TONGUE EVERY 5 MINUTES AS NEEDED FOR CHEST PAIN.  CALL MD/ 911 IF TAKE 2 DOSES. MAX 3 DOSES PER DAY 12/03/20   Binnie Rail, MD  ?ondansetron (ZOFRAN) 4 MG tablet Take 1 tablet (4 mg total) by mouth every 8 (eight) hours as needed for nausea or vomiting. 09/01/21   Binnie Rail, MD  ?pantoprazole (PROTONIX) 40 MG tablet TAKE 1 TABLET(40 MG) BY MOUTH TWICE DAILY 09/25/19   Buford Dresser, MD  ?PEPCID 40 MG tablet Take 40 mg by mouth at bedtime. 11/04/19   [provider]  ?potassium chloride SA (K-DUR) 20 MEQ tablet Take 1 tablet (20 mEq total) by mouth daily as needed (when you take lasix). 02/28/19   Buford Dresser, MD  ?ranolazine (RANEXA) 500 MG 12 hr tablet Take 1  tablet (500 mg total) by mouth 2 (two) times daily. 05/13/19   Buford Dresser, MD  ?rosuvastatin (CRESTOR) 40 MG tablet TAKE 1 TABLET(40 MG) BY MOUTH DAILY 06/16/20   Binnie Rail, MD  ?   ? ?Allergies    ?Patient has no known allergies.   ? ?Review of Systems   ?Review of Systems ? ?Physical Exam ?Updated Vital Signs ?BP (!) 158/81   Pulse 73   Temp (!) 97.5 ?F (36.4 ?C) (Oral)   Resp 18   Ht 6' (1.829 m)   Wt 105.2 kg   SpO2 100%   BMI 31.46 kg/m?  ?Physical Exam ?Vitals and nursing note reviewed.  ?Constitutional:   ?   General: He is not in acute distress. ?   Appearance: He is not diaphoretic.  ?HENT:  ?   Head: Normocephalic and atraumatic.  ?Eyes:  ?   General: No scleral icterus.    ?   Right eye: No discharge.     ?   Left eye: No discharge.  ?   Conjunctiva/sclera: Conjunctivae normal.  ?Cardiovascular:  ?   Rate and Rhythm: Normal rate and regular rhythm.  ?   Pulses:     ?     Radial pulses are 2+ on the right side and 2+ on the left side.  ?   Heart sounds: Normal heart sounds.  ?Pulmonary:  ?   Effort: Pulmonary effort is normal. No respiratory distress.  ?   Breath sounds: Normal breath sounds. No stridor.  ?Abdominal:  ?   General: There is no distension.  ?Musculoskeletal:     ?   General: No deformity.  ?   Cervical back: Normal range of motion and neck supple.  ?Skin: ?   General: Skin is warm and dry.  ?Neurological:  ?   Mental Status: He is alert.  ?   Motor: No abnormal muscle tone.  ?Psychiatric:     ?   Behavior: Behavior normal.  ? ? ?ED Results / Procedures / Treatments   ?Labs ?(all labs ordered are listed, but only abnormal results are displayed) ?Labs Reviewed  ?BASIC METABOLIC PANEL - Abnormal; Notable for the following components:  ?    Result Value  ? Glucose, Bld 163 (*)   ? BUN 38 (*)   ? Creatinine, Ser 2.05 (*)   ? GFR, Estimated 34 (*)   ? All other components within normal limits  ?CBC - Abnormal; Notable for the following components:  ? WBC 11.4 (*)   ? All  other components within normal limits  ?CBG MONITORING, ED - Abnormal; Notable for the following components:  ? Glucose-Capillary 167 (*)   ? All other components within normal limits  ?TROPONIN I (HIGH SENSITIVITY) -  Abnormal; Notable for the following components:  ? Troponin I (High Sensitivity) 19 (*)   ? All other components within normal limits  ?LIPASE, BLOOD  ?HEPATIC FUNCTION PANEL  ?TROPONIN I (HIGH SENSITIVITY)  ? ? ?EKG ?EKG Interpretation ? ?Date/Time:  Thursday December 16 2021 16:12:42 EDT ?Ventricular Rate:  78 ?PR Interval:  160 ?QRS Duration: 124 ?QT Interval:  406 ?QTC Calculation: 462 ?R Axis:   34 ?Text Interpretation: Normal sinus rhythm Non-specific intra-ventricular conduction delay Minimal voltage criteria for LVH, may be normal variant ( Cornell product ) Nonspecific ST and T wave abnormality Abnormal ECG When compared with ECG of 30-Jan-2019 09:05, PREVIOUS ECG IS PRESENT Similar to prior Confirmed by [name] [phone]) on 12/16/2021 4:37:02 PM ? ?Radiology ?DG Chest 2 View ? ?Result Date: 12/16/2021 ?CLINICAL DATA:  Chest pain and short of breath EXAM: CHEST - 2 VIEW COMPARISON:  01/28/2019 FINDINGS: Cardiac enlargement. Prior CABG. Negative for heart failure. Lungs clear without infiltrate or effusion. IMPRESSION: No active cardiopulmonary disease. Electronically Signed   By: Franchot Gallo M.D.   On: 12/16/2021 17:29   ? ?Procedures ?Procedures  ? ? ?Medications Ordered in ED ?Medications  ?aspirin chewable tablet 243 mg (243 mg Oral Given 12/16/21 1641)  ? ? ?ED Course/ Medical Decision Making/ A&P ?Clinical Course as of 12/16/21 1751  ?Thu Dec 16, 2021  ?1729 Attempted to see patient x2, he is not in room, suspect in x-ray.  [EH]  ?  ?Clinical Course User Index ?[EH] Lorin Glass, PA-C  ? ?                        ?Medical Decision Making ?Patient is a 71 year old male with extensive cardiovascular history who presents today for evaluation of feeling unwell with intermittent chest  pain, nausea, diaphoresis. ?Here his initial troponin is slightly elevated at 19. ? ?Cardiology saw patient and admitted him. ? ?Note: Portions of this report may have been transcribed using voice recognition software. Every

## 2021-12-16 NOTE — ED Notes (Signed)
Pt aware urine sample needed 

## 2021-12-16 NOTE — ED Provider Triage Note (Signed)
Emergency Medicine Provider Triage Evaluation Note ? ?Russell Trevino , a 71 y.o. male  was evaluated in triage.  Pt complains of left-sided nonradiating chest pain onset at 11 AM today.  He notes he has associated shortness of breath, diaphoresis, nausea.  Patient has an extensive history of quadruple bypass, heart attack, stents.  He is followed by cardiology.  Patient tried 3 nitro prior to arrival with mild relief in his symptoms.  He also took a baby aspirin.  Denies vomiting. ? ?Review of Systems  ?Positive: As per HPI above ?Negative: ? ?Physical Exam  ?BP 126/66 (BP Location: Right Arm)   Pulse 76   Temp (!) 97.5 ?F (36.4 ?C) (Oral)   Resp 16   SpO2 99%  ?Gen:   Awake, uncomfortable appearing ?Resp:  Normal effort ?MSK:   Moves extremities without difficulty  ?Other:  No chest wall tenderness to palpation. ? ?Medical Decision Making  ?Medically screening exam initiated at 4:23 PM.  Appropriate orders placed.  Russell Trevino was informed that the remainder of the evaluation will be completed by another provider, this initial triage assessment does not replace that evaluation, and the importance of remaining in the ED until their evaluation is complete. ? ?4:24 PM - Discussed with RN that patient is in need of a room immediately. RN aware and working on room placement.  ?  ?Terria Deschepper A, PA-C ?12/16/21 1624 ? ?

## 2021-12-16 NOTE — ED Triage Notes (Signed)
Pt states that today at approximately 1100 he noticed having L sided non-radiating CP, SOB, diaphoresis, nausea. Pt reports taking 3 nitro PTA with some relief.   ?

## 2021-12-16 NOTE — H&P (Signed)
?Cardiology Admission History and Physical:  ? ?Patient ID: Russell Trevino ?MRN: [id number]; DOB: 09/23/1950  ? ?Admission date: 12/16/2021 ? ?PCP:  Binnie Rail, MD ?  ?[facility] Prov[facility] ?Cardiologist:  Buford Dresser, MD   -[place]. ? ?Chief Complaint:  Chest pain ? ?Patient Profile:  ? ?Russell Trevino is a 71 y.o. male with PMH of CAD s/p prior CABG (Patent LIMA to the LAD, Patent free radial graft arising from the mid LIMA graft and supplying the first diagonal and OM, known SVG -RCA Occlusion 10/26/18 LHC-G), ICM (EF 40-45%), HTN, HLD, DM, GERD, CKD stage IIIb who is being seen 12/16/2021 for the evaluation of chest pain. ? ?History of Present Illness:  ? ?Russell Trevino is a 71 yo male with PMH noted above. He has been followed by Dr. Harrell Trevino as an outpatient.  ? ?Patient notes that he is feeling has moved to [facility].  He  ?Has been doing relatively OK and has worked to establish care in this region.  He knows he has had some medication changes (SGLT2i stopped for Ozempic).  He does not carry a copy of his medication list, after much discussion our best guess is that he takes ASA, plavix, Entresto 49/51, rosuvastatin 40, Imdur 120 mg PO daily, and metoprolol 50 mg daily.  His prior anginal equivalent was discomfort without pain and diaphoresis.  He also gets hypoglycemia relate diaphoresis ? ?For the past two days he has felt a bit off.  This AM, he had chest discomfort and thought it was his low blood sugars.  Took his orange pills and sat down but did not improve symptoms.   ?Nitro X3 improved his symptoms.  He still has persistent chest discomfort though it has improved.  Prior to this did not have exertional discomfort or diaphoresis.  No shortness of breath, DOE .  No PND or orthopnea.  No weight gain, leg swelling , or abdominal swelling.  No syncope or near syncope . Notes  no palpitations or funny heart beats.     ? ?He hs noted SOB to ED Provider. ? ?EKG shows SR St  elevations are unchanged from 01/28/21. ?First troponin 19 send drawn during our evaluation. ?LVEF 40% moderate MR. ?CXR is clear. ? ? ?Past Medical History:  ?Diagnosis Date  ? BPH (benign prostatic hypertrophy)   ? Cardiomyopathy, ischemic   ? Coronary artery disease   ? at [facility], [place] 2009 (GRAFTS PATENT), cath 05/2018 w/ SVG-PDA 100%>>med rx  ? Diabetes mellitus, type 2 ([place])   ? Frequency of urination   ? GERD (gastroesophageal reflux disease)   ? Gross hematuria   ? History of CHF (congestive heart failure) 2007  ? History of kidney stones   ? History of myocardial infarction 2006, 2019  ? 2006>>CABG, 2019 cath w/ med rx  ? History of urinary retention   ? Hyperlipidemia   ? Nocturia   ? Renal calculi   ? BILATERAL PER CT SCAN--  NON-OBSTRUCTIVE  ? S/P CABG x 4 2006  ? ? ?Past Surgical History:  ?Procedure Laterality Date  ? CARDIAC CATHETERIZATION  01/17/2008  ? GRAFTS PATENT / EF 40%,  DR SANTOS ([facility])  ? CORONARY ARTERY BYPASS GRAFT  04/2005  ? LIMA-LAD, L radial-OM-D1 and SVG-PDA  ? CYSTO / RIGHT URETERAL STENT PLACEMENT  10-08-1999  ? CYSTO/  REMOVAL BLADDER STONES  01-14-2010  ? CYSTOSCOPY  10/16/2012  ? Procedure: CYSTOSCOPY;  Surgeon: Malka So, MD;  Location: [facility];  Service: Urology;  Laterality: N/A;  Cystoscopy with Clot Evacuation and fulguration bladder neck.  ? EXTRACORPOREAL SHOCK WAVE LITHOTRIPSY  10-15-2009  ? RIGHT  ? KNEE ARTHROSCOPY  1978  ? RIGHT  ? LEFT SHOULDER SURGERY   2002  ? LUMBAR FUSION  09-10-2008  ? L4 - L5  ? PERCUTANEOUS NEPHROSTOLITHOTOMY  1979  ? PROSTATE BIOPSY  10/16/2012  ? Procedure: PROSTATE BIOPSY;  Surgeon: Malka So, MD;  Location: [facility];  Service: Urology;  Laterality: N/A;  Through ultrasound.  ? RIGHT/LEFT HEART CATH AND CORONARY/GRAFT ANGIOGRAPHY N/A 10/26/2018  ? Procedure: RIGHT/LEFT HEART CATH AND CORONARY/GRAFT ANGIOGRAPHY;  Surgeon: Martinique, Peter M, MD;  Location: [facility];  Service: Cardiovascular;   Laterality: N/A;  ? SHOULDER ARTHROSCOPY W/ SUBACROMIAL DECOMPRESSION AND DISTAL CLAVICLE EXCISION  10-11-2004  ? AND PARTIAL ROTATOR CUFF REPAIR  ? TRANSTHORACIC ECHOCARDIOGRAM  01/09/2012  ? LV SYSTOLIC FUNCTION MILDLY REDUCED/ EF 48%/ LV FILLING PATTERN  IS IMPAIRED/ HYPOKINESIS IN THE MID AND BASALAR SEPTUM & ANTEROSEPTUM  ? TURP VAPORIZATION  01-19-2010  ? BPH W/ BOO  ? URETEROLITHOTOMY  1976  ?  ? ?Medications Prior to Admission: ?Prior to Admission medications   ?Medication Sig Start Date End Date Taking? Authorizing Provider  ?amLODipine (NORVASC) 5 MG tablet Take 1 tablet (5 mg total) by mouth daily. 05/13/19 05/07/20  Buford Dresser, MD  ?aspirin 81 MG tablet Take 81 mg by mouth daily at 12 noon.     [provider]  ?clopidogrel (PLAVIX) 75 MG tablet TAKE 1 TABLET(75 MG) BY MOUTH DAILY 12/29/20   Buford Dresser, MD  ?empagliflozin (JARDIANCE) 10 MG TABS tablet Take 10 mg by mouth daily before breakfast. 11/20/19   Binnie Rail, MD  ?ENTRESTO 49-51 MG TAKE 1 TABLET BY MOUTH TWICE DAILY 12/29/20   Buford Dresser, MD  ?ezetimibe (ZETIA) 10 MG tablet Take 1 tablet (10 mg total) by mouth every evening. 06/25/19   Buford Dresser, MD  ?finasteride (PROSCAR) 5 MG tablet TAKE 1 TABLET(5 MG) BY MOUTH DAILY 10/15/20   Binnie Rail, MD  ?furosemide (LASIX) 80 MG tablet TAKE 1 TABLET(80 MG) BY MOUTH DAILY ON AN EMPTY STOMACH 10/14/21   Buford Dresser, MD  ?gabapentin (NEURONTIN) 100 MG capsule Take 1 cap in morning and 2 cap HS 03/24/21   Burns, Claudina Lick, MD  ?HYDROcodone bit-homatropine (HYDROMET) 5-1.5 MG/5ML syrup Take 5 mLs by mouth every 6 (six) hours as needed for cough. 08/18/21   Binnie Rail, MD  ?insulin NPH-regular Human (HUMULIN 70/30) (70-30) 100 UNIT/ML injection 75 units with breakfast, and 40 units with supper 07/12/21   Binnie Rail, MD  ?Insulin Syringe-Needle U-100 (BD VEO INSULIN SYRINGE U/F) 31G X 15/64" 1 ML MISC use as directed TO INJECT INSULIN   twice a day 07/12/21   Binnie Rail, MD  ?isosorbide mononitrate (IMDUR) 120 MG 24 hr tablet Take 1 tablet (120 mg total) by mouth 2 (two) times daily. 08/30/19   Buford Dresser, MD  ?metoprolol succinate (TOPROL-XL) 50 MG 24 hr tablet Take 1 tablet (50 mg total) by mouth daily at 12 noon. Take with or immediately following a meal. 01/31/19   Buford Dresser, MD  ?nitroGLYCERIN (NITROSTAT) 0.4 MG SL tablet DISSOLVE 1 TABLET UNDER THE TONGUE EVERY 5 MINUTES AS NEEDED FOR CHEST PAIN. CALL MD/ 911 IF TAKE 2 DOSES. MAX 3 DOSES PER DAY 12/03/20   Binnie Rail, MD  ?ondansetron [facility]) 4  MG tablet Take 1 tablet (4 mg total) by mouth every 8 (eight) hours as needed for nausea or vomiting. 09/01/21   Binnie Rail, MD  ?pantoprazole (PROTONIX) 40 MG tablet TAKE 1 TABLET(40 MG) BY MOUTH TWICE DAILY 09/25/19   Buford Dresser, MD  ?PEPCID 40 MG tablet Take 40 mg by mouth at bedtime. 11/04/19   [provider]  ?potassium chloride SA (K-DUR) 20 MEQ tablet Take 1 tablet (20 mEq total) by mouth daily as needed (when you take lasix). 02/28/19   Buford Dresser, MD  ?ranolazine (RANEXA) 500 MG 12 hr tablet Take 1 tablet (500 mg total) by mouth 2 (two) times daily. 05/13/19   Buford Dresser, MD  ?rosuvastatin (CRESTOR) 40 MG tablet TAKE 1 TABLET(40 MG) BY MOUTH DAILY 06/16/20   Binnie Rail, MD  ?  ? ?Allergies:   No Known Allergies ? ?Social History:   ?Social History  ? ?Socioeconomic History  ? Marital status: Legally Separated  ?  Spouse name: Not on file  ? Number of children: 4  ? Years of education: Not on file  ? Highest education level: Not on file  ?Occupational History  ? Not on file  ?Tobacco Use  ? Smoking status: Never  ? Smokeless tobacco: Never  ?Vaping Use  ? Vaping Use: Never used  ?Substance and Sexual Activity  ? Alcohol use: Yes  ?  Alcohol/week: 2.0 standard drinks  ?  Types: 2 Cans of beer per week  ? Drug use: No  ? Sexual activity: Not on file  ?Other Topics  Concern  ? Not on file  ?Social History Narrative  ? Lives in [place], [place], has family and friends in [place].  ? Drinking caffeine sodas 3-4 bottles a day  ? Right handed  ? ?Social Determinants of Health  ? ?[place]

## 2021-12-16 NOTE — ED Provider Notes (Signed)
Blood pressure 126/66, pulse 76, temperature (!) 97.5 ?F (36.4 ?C), temperature source Oral, resp. rate 16, SpO2 99 %. ? ?In short, Russell Trevino is a 71 y.o. male with a chief complaint of Chest Pain and Shortness of Breath ?Marland Kitchen  Refer to the original H&P for additional details. ? ?04:35 PM  ?Reviewed EKD with Dr. Lavonna Monarch. ST changes similar to prior. No STEMI.  ? ? EKG Interpretation ? ?Date/Time:  Thursday December 16 2021 16:12:42 EDT ?Ventricular Rate:  78 ?PR Interval:  160 ?QRS Duration: 124 ?QT Interval:  406 ?QTC Calculation: 462 ?R Axis:   34 ?Text Interpretation: Normal sinus rhythm Non-specific intra-ventricular conduction delay Minimal voltage criteria for LVH, may be normal variant ( Cornell product ) Nonspecific ST and T wave abnormality Abnormal ECG When compared with ECG of 30-Jan-2019 09:05, PREVIOUS ECG IS PRESENT Similar to prior Confirmed by [name] [phone]) on 12/16/2021 4:37:02 PM ?  ? ?  ? ? ? ?  ?[name], MD ?12/16/21 1637 ? ?

## 2021-12-17 ENCOUNTER — Ambulatory Visit (HOSPITAL_COMMUNITY)
Admission: EM | Disposition: A | Discharge status: Home / Self Care | Payer: Self-pay | Source: Home / Self Care | Attending: Emergency Medicine

## 2021-12-17 ENCOUNTER — Other Ambulatory Visit (HOSPITAL_COMMUNITY): Payer: Self-pay

## 2021-12-17 ENCOUNTER — Observation Stay (HOSPITAL_BASED_OUTPATIENT_CLINIC_OR_DEPARTMENT_OTHER): Payer: Medicare Other

## 2021-12-17 ENCOUNTER — Encounter (HOSPITAL_COMMUNITY): Payer: Self-pay | Admitting: Internal Medicine

## 2021-12-17 ENCOUNTER — Other Ambulatory Visit: Payer: Self-pay

## 2021-12-17 DIAGNOSIS — I5020 Unspecified systolic (congestive) heart failure: Secondary | ICD-10-CM | POA: Diagnosis not present

## 2021-12-17 DIAGNOSIS — I25110 Atherosclerotic heart disease of native coronary artery with unstable angina pectoris: Secondary | ICD-10-CM | POA: Diagnosis not present

## 2021-12-17 DIAGNOSIS — Z79899 Other long term (current) drug therapy: Secondary | ICD-10-CM | POA: Diagnosis not present

## 2021-12-17 DIAGNOSIS — R079 Chest pain, unspecified: Secondary | ICD-10-CM

## 2021-12-17 DIAGNOSIS — N1832 Chronic kidney disease, stage 3b: Secondary | ICD-10-CM | POA: Diagnosis not present

## 2021-12-17 DIAGNOSIS — I130 Hypertensive heart and chronic kidney disease with heart failure and stage 1 through stage 4 chronic kidney disease, or unspecified chronic kidney disease: Secondary | ICD-10-CM | POA: Diagnosis not present

## 2021-12-17 DIAGNOSIS — I252 Old myocardial infarction: Secondary | ICD-10-CM | POA: Diagnosis not present

## 2021-12-17 DIAGNOSIS — I2510 Atherosclerotic heart disease of native coronary artery without angina pectoris: Secondary | ICD-10-CM | POA: Diagnosis not present

## 2021-12-17 DIAGNOSIS — Z7982 Long term (current) use of aspirin: Secondary | ICD-10-CM | POA: Diagnosis not present

## 2021-12-17 DIAGNOSIS — Z7902 Long term (current) use of antithrombotics/antiplatelets: Secondary | ICD-10-CM | POA: Diagnosis not present

## 2021-12-17 DIAGNOSIS — I5023 Acute on chronic systolic (congestive) heart failure: Secondary | ICD-10-CM | POA: Diagnosis not present

## 2021-12-17 DIAGNOSIS — I38 Endocarditis, valve unspecified: Secondary | ICD-10-CM | POA: Diagnosis not present

## 2021-12-17 DIAGNOSIS — Z794 Long term (current) use of insulin: Secondary | ICD-10-CM | POA: Diagnosis not present

## 2021-12-17 DIAGNOSIS — E1122 Type 2 diabetes mellitus with diabetic chronic kidney disease: Secondary | ICD-10-CM | POA: Diagnosis not present

## 2021-12-17 DIAGNOSIS — Z951 Presence of aortocoronary bypass graft: Secondary | ICD-10-CM | POA: Diagnosis not present

## 2021-12-17 DIAGNOSIS — E1159 Type 2 diabetes mellitus with other circulatory complications: Secondary | ICD-10-CM | POA: Diagnosis not present

## 2021-12-17 DIAGNOSIS — I200 Unstable angina: Secondary | ICD-10-CM | POA: Diagnosis not present

## 2021-12-17 DIAGNOSIS — Z7984 Long term (current) use of oral hypoglycemic drugs: Secondary | ICD-10-CM | POA: Diagnosis not present

## 2021-12-17 HISTORY — PX: LEFT HEART CATH AND CORS/GRAFTS ANGIOGRAPHY: CATH118250

## 2021-12-17 LAB — GLUCOSE, CAPILLARY
Glucose-Capillary: 200 mg/dL — ABNORMAL HIGH (ref 70–99)
Glucose-Capillary: 224 mg/dL — ABNORMAL HIGH (ref 70–99)

## 2021-12-17 LAB — URINALYSIS, ROUTINE W REFLEX MICROSCOPIC
Bacteria, UA: NONE SEEN
Bilirubin Urine: NEGATIVE
Glucose, UA: >=500 mg/dL — AB
Hgb urine dipstick: NEGATIVE
Ketones, ur: NEGATIVE mg/dL
Leukocytes,Ua: NEGATIVE
Nitrite: NEGATIVE
Protein, ur: 100 mg/dL — AB
Specific Gravity, Urine: 1.017 (ref 1.005–1.030)
pH: 5 (ref 5.0–8.0)

## 2021-12-17 LAB — TSH: TSH: 1.259 u[IU]/mL (ref 0.350–4.500)

## 2021-12-17 LAB — ECHOCARDIOGRAM COMPLETE
AR max vel: 2.89 cm2
AV Area VTI: 2.97 cm2
AV Area mean vel: 2.89 cm2
AV Mean grad: 2 mmHg
AV Peak grad: 3.8 mmHg
Ao pk vel: 0.98 m/s
Area-P 1/2: 3.93 cm2
Height: 72 in
S' Lateral: 5.3 cm
Weight: 3760.17 oz

## 2021-12-17 LAB — CBC
HCT: 39.8 % (ref 39.0–52.0)
Hemoglobin: 14.3 g/dL (ref 13.0–17.0)
MCH: 31 pg (ref 26.0–34.0)
MCHC: 35.9 g/dL (ref 30.0–36.0)
MCV: 86.3 fL (ref 80.0–100.0)
Platelets: 178 10*3/uL (ref 150–400)
RBC: 4.61 MIL/uL (ref 4.22–5.81)
RDW: 13.3 % (ref 11.5–15.5)
WBC: 9.2 10*3/uL (ref 4.0–10.5)
nRBC: 0 % (ref 0.0–0.2)

## 2021-12-17 LAB — LIPID PANEL
Cholesterol: 345 mg/dL — ABNORMAL HIGH (ref 0–200)
HDL: 46 mg/dL (ref >40)
LDL Cholesterol: 231 mg/dL — ABNORMAL HIGH (ref 0–99)
Total CHOL/HDL Ratio: 7.5 RATIO
Triglycerides: 338 mg/dL — ABNORMAL HIGH (ref <150)
VLDL: 68 mg/dL — ABNORMAL HIGH (ref 0–40)

## 2021-12-17 LAB — BASIC METABOLIC PANEL
Anion gap: 9 (ref 5–15)
BUN: 40 mg/dL — ABNORMAL HIGH (ref 8–23)
CO2: 25 mmol/L (ref 22–32)
Calcium: 9.3 mg/dL (ref 8.9–10.3)
Chloride: 100 mmol/L (ref 98–111)
Creatinine, Ser: 1.97 mg/dL — ABNORMAL HIGH (ref 0.61–1.24)
GFR, Estimated: 36 mL/min — ABNORMAL LOW (ref >60)
Glucose, Bld: 246 mg/dL — ABNORMAL HIGH (ref 70–99)
Potassium: 4.5 mmol/L (ref 3.5–5.1)
Sodium: 134 mmol/L — ABNORMAL LOW (ref 135–145)

## 2021-12-17 LAB — HEMOGLOBIN A1C
Hgb A1c MFr Bld: 11.2 % — ABNORMAL HIGH (ref 4.8–5.6)
Mean Plasma Glucose: 274.74 mg/dL

## 2021-12-17 SURGERY — LEFT HEART CATH AND CORS/GRAFTS ANGIOGRAPHY
Anesthesia: LOCAL

## 2021-12-17 MED ORDER — HEPARIN (PORCINE) IN NACL 1000-0.9 UT/500ML-% IV SOLN
INTRAVENOUS | Status: DC | PRN
  Administered 2021-12-17 (×2): 500 mL

## 2021-12-17 MED ORDER — ROSUVASTATIN CALCIUM 40 MG PO TABS
40.0000 mg | ORAL_TABLET | Freq: Every day | ORAL | 3 refills | Status: DC
Start: 2021-12-17 — End: 2021-12-28
  Filled 2021-12-17: qty 90, 90d supply, fill #0

## 2021-12-17 MED ORDER — NITROGLYCERIN 0.4 MG SL SUBL
SUBLINGUAL_TABLET | SUBLINGUAL | 0 refills | Status: DC
  Filled 2021-12-17: qty 25, 14d supply, fill #0

## 2021-12-17 MED ORDER — SODIUM CHLORIDE 0.9% FLUSH: 3.0000 mL | Freq: Two times a day (BID) | INTRAVENOUS | Status: DC

## 2021-12-17 MED ORDER — ISOSORBIDE MONONITRATE ER 120 MG PO TB24
120.0000 mg | ORAL_TABLET | Freq: Two times a day (BID) | ORAL | 3 refills | Status: DC
  Filled 2021-12-17: qty 180, 90d supply, fill #0

## 2021-12-17 MED ORDER — METOPROLOL SUCCINATE ER 50 MG PO TB24
50.0000 mg | ORAL_TABLET | Freq: Every day | ORAL | 3 refills | Status: DC
  Filled 2021-12-17: qty 90, 90d supply, fill #0

## 2021-12-17 MED ORDER — RANOLAZINE ER 500 MG PO TB12
500.0000 mg | ORAL_TABLET | Freq: Two times a day (BID) | ORAL | 3 refills | Status: DC
  Filled 2021-12-17: qty 180, 90d supply, fill #0

## 2021-12-17 MED ORDER — EZETIMIBE 10 MG PO TABS
10.0000 mg | ORAL_TABLET | Freq: Every evening | ORAL | 1 refills | Status: DC
  Filled 2021-12-17: qty 90, 90d supply, fill #0

## 2021-12-17 MED ORDER — IOHEXOL 350 MG/ML SOLN
INTRAVENOUS | Status: DC | PRN
  Administered 2021-12-17: 15 mL

## 2021-12-17 MED ORDER — SODIUM CHLORIDE 0.9% FLUSH
3.0000 mL | INTRAVENOUS | Status: DC | PRN
Start: 2021-12-17 — End: 2021-12-17

## 2021-12-17 MED ORDER — ONDANSETRON HCL 4 MG/2ML IJ SOLN: 4.0000 mg | Freq: Four times a day (QID) | INTRAMUSCULAR | Status: DC | PRN

## 2021-12-17 MED ORDER — PERFLUTREN LIPID MICROSPHERE
1.0000 mL | INTRAVENOUS | Status: AC | PRN
  Administered 2021-12-17: 6 mL via INTRAVENOUS
  Filled 2021-12-17: qty 10

## 2021-12-17 MED ORDER — LIDOCAINE HCL (PF) 1 % IJ SOLN
INTRAMUSCULAR | Status: AC
  Filled 2021-12-17: qty 30

## 2021-12-17 MED ORDER — MIDAZOLAM HCL 2 MG/2ML IJ SOLN
INTRAMUSCULAR | Status: AC
  Filled 2021-12-17: qty 2

## 2021-12-17 MED ORDER — LABETALOL HCL 5 MG/ML IV SOLN: 10.0000 mg | INTRAVENOUS | Status: AC | PRN

## 2021-12-17 MED ORDER — FUROSEMIDE 40 MG PO TABS
40.0000 mg | ORAL_TABLET | ORAL | 6 refills | Status: DC | PRN
  Filled 2021-12-17: qty 30, 30d supply, fill #0

## 2021-12-17 MED ORDER — ACETAMINOPHEN 325 MG PO TABS: 650.0000 mg | ORAL_TABLET | ORAL | Status: DC | PRN

## 2021-12-17 MED ORDER — SODIUM CHLORIDE 0.9 % IV SOLN: INTRAVENOUS | Status: AC

## 2021-12-17 MED ORDER — ASPIRIN 81 MG PO TBEC
81.0000 mg | DELAYED_RELEASE_TABLET | Freq: Every morning | ORAL | 6 refills | Status: DC
  Filled 2021-12-17: qty 30, 30d supply, fill #0

## 2021-12-17 MED ORDER — LIDOCAINE HCL (PF) 1 % IJ SOLN
INTRAMUSCULAR | Status: DC | PRN
  Administered 2021-12-17: 10 mL

## 2021-12-17 MED ORDER — CLOPIDOGREL BISULFATE 75 MG PO TABS
75.0000 mg | ORAL_TABLET | Freq: Every day | ORAL | 6 refills | Status: DC
  Filled 2021-12-17: qty 30, 30d supply, fill #0

## 2021-12-17 MED ORDER — ENTRESTO 49-51 MG PO TABS
1.0000 | ORAL_TABLET | Freq: Two times a day (BID) | ORAL | 6 refills | Status: DC
  Filled 2021-12-17: qty 60, 30d supply, fill #0

## 2021-12-17 MED ORDER — SODIUM CHLORIDE 0.9 % IV SOLN: 250.0000 mL | INTRAVENOUS | Status: DC | PRN

## 2021-12-17 MED ORDER — FENTANYL CITRATE (PF) 100 MCG/2ML IJ SOLN
INTRAMUSCULAR | Status: AC
  Filled 2021-12-17: qty 2

## 2021-12-17 MED ORDER — MIDAZOLAM HCL 2 MG/2ML IJ SOLN
INTRAMUSCULAR | Status: DC | PRN
  Administered 2021-12-17: 1 mg via INTRAVENOUS

## 2021-12-17 MED ORDER — HEPARIN (PORCINE) IN NACL 1000-0.9 UT/500ML-% IV SOLN
INTRAVENOUS | Status: AC
  Filled 2021-12-17: qty 1000

## 2021-12-17 MED ORDER — HYDRALAZINE HCL 20 MG/ML IJ SOLN: 10.0000 mg | INTRAMUSCULAR | Status: AC | PRN

## 2021-12-17 MED ORDER — HEPARIN SODIUM (PORCINE) 1000 UNIT/ML IJ SOLN
INTRAMUSCULAR | Status: DC | PRN
  Administered 2021-12-17: 2000 [IU] via INTRAVENOUS

## 2021-12-17 MED ORDER — HEPARIN SODIUM (PORCINE) 1000 UNIT/ML IJ SOLN
INTRAMUSCULAR | Status: AC
  Filled 2021-12-17: qty 10

## 2021-12-17 MED ORDER — RANOLAZINE ER 500 MG PO TB12
500.0000 mg | ORAL_TABLET | Freq: Two times a day (BID) | ORAL | Status: DC
  Administered 2021-12-17: 500 mg via ORAL
  Filled 2021-12-17 (×2): qty 1

## 2021-12-17 MED ORDER — VERAPAMIL HCL 2.5 MG/ML IV SOLN
INTRAVENOUS | Status: AC
Start: 2021-12-17 — End: ?
  Filled 2021-12-17: qty 2

## 2021-12-17 MED ORDER — FENTANYL CITRATE (PF) 100 MCG/2ML IJ SOLN
INTRAMUSCULAR | Status: DC | PRN
  Administered 2021-12-17: 25 ug via INTRAVENOUS

## 2021-12-17 SURGICAL SUPPLY — 12 items
CATH DIAG 6FR JL4 (CATHETERS) ×1 IMPLANT
CATH DIAG 6FR PIGTAIL ANGLED (CATHETERS) ×1 IMPLANT
CATH INFINITI 6F 1M (CATHETERS) ×1 IMPLANT
CLOSURE PERCLOSE PROSTYLE (VASCULAR PRODUCTS) ×1 IMPLANT
KIT HEART LEFT (KITS) ×2 IMPLANT
PACK CARDIAC CATHETERIZATION (CUSTOM PROCEDURE TRAY) ×2 IMPLANT
SHEATH PINNACLE 6F 10CM (SHEATH) ×1 IMPLANT
SHEATH PROBE COVER 6X72 (BAG) ×1 IMPLANT
TRANSDUCER W/STOPCOCK (MISCELLANEOUS) ×2 IMPLANT
TUBING CIL FLEX 10 FLL-RA (TUBING) ×2 IMPLANT
WIRE EMERALD 3MM-J .035X150CM (WIRE) ×1 IMPLANT
WIRE MICRO SET SILHO 5FR 7 (SHEATH) ×1 IMPLANT

## 2021-12-17 NOTE — Progress Notes (Signed)
Echocardiogram ?2D Echocardiogram has been performed. ? ?Russell Trevino ?12/17/2021, 9:00 AM ?

## 2021-12-17 NOTE — Progress Notes (Signed)
Pt received from cath lab. VSS. R groin level 0. Call light in reach. ? ?Clyde Canterbury, RN ? ?

## 2021-12-17 NOTE — Interval H&P Note (Signed)
History and Physical Interval Note: ? ?12/17/2021 ?9:47 AM ? ?Russell Trevino  has presented today for surgery, with the diagnosis of unstable angina.  The various methods of treatment have been discussed with the patient and family. After consideration of risks, benefits and other options for treatment, the patient has consented to  Procedure(s): ?LEFT HEART CATH AND CORS/GRAFTS ANGIOGRAPHY (N/A) as a surgical intervention.  The patient's history has been reviewed, patient examined, no change in status, stable for surgery.  I have reviewed the patient's chart and labs.  Questions were answered to the patient's satisfaction.   ? ?Cath Lab Visit (complete for each Cath Lab visit) ? ?Clinical Evaluation Leading to the Procedure:  ? ?ACS: Yes.   ? ?Non-ACS:   ? ?Anginal Classification: CCS III ? ?Anti-ischemic medical therapy: Minimal Therapy (1 class of medications) ? ?Non-Invasive Test Results: No non-invasive testing performed ? ?Prior CABG: Previous CABG ? ? ? ? ? ? ? ?Russell Trevino ? ? ?

## 2021-12-17 NOTE — Progress Notes (Signed)
? ?Progress Note ? ?Patient Name: Russell Trevino ?Date of Encounter: 12/17/2021 ? ?Primary Cardiologist: Buford Dresser, MD  ? ?Subjective  ? ?Decreased chest discomfort in interim.  Creatinine stable. ?Echo has started, LVEF is moderate decreased septal hypokinesis that is also seen in prior study. ? ?Inpatient Medications  ?  ?Scheduled Meds: ? aspirin  81 mg Oral Daily  ? clopidogrel  75 mg Oral Daily  ? ezetimibe  10 mg Oral QPM  ? heparin  5,000 Units Subcutaneous Q8H  ? insulin aspart  0-15 Units Subcutaneous TID WC  ? isosorbide mononitrate  120 mg Oral BID  ? metoprolol succinate  50 mg Oral Q1200  ? pantoprazole  40 mg Oral Daily  ? rosuvastatin  40 mg Oral Daily  ? sacubitril-valsartan  1 tablet Oral BID  ? ?Continuous Infusions: ? sodium chloride 1 mL/kg/hr (12/17/21 0526)  ? ?PRN Meds: ?acetaminophen, nitroGLYCERIN, ondansetron (ZOFRAN) IV  ? ?Vital Signs  ?  ?Vitals:  ? 12/17/21 0109 12/17/21 5053 12/17/21 9767 12/17/21 0724  ?BP: 131/69 (!) 141/71  (!) 144/71  ?Pulse: 81 76  73  ?Resp: '18 15  14  '$ ?Temp: 97.9 ?F (36.6 ?C) 97.8 ?F (36.6 ?C)  98 ?F (36.7 ?C)  ?TempSrc: Oral Oral  Oral  ?SpO2: 99% 97%  98%  ?Weight:   106.6 kg   ?Height:      ? ? ?Intake/Output Summary (Last 24 hours) at 12/17/2021 0815 ?Last data filed at 12/17/2021 3419 ?Gross per 24 hour  ?Intake 0 ml  ?Output 625 ml  ?Net -625 ml  ? ?Filed Weights  ? 12/16/21 1702 12/16/21 2228 12/17/21 0627  ?Weight: 105.2 kg 106.2 kg 106.6 kg  ? ? ?Telemetry  ?  ?SR with PVC - Personally Reviewed ? ?Physical Exam  ? ?Gen: no distress   ?Neck: No JVD ?Ears: bilateral Pilar Plate Sign ?Cardiac: No Rubs or Gallops, no Murmur, RRR, +2 R  radial pulses, distant heart sounds ?Respiratory: Clear to auscultation bilaterally, normal effort, normal  respiratory rate ?GI: Soft, nontender, non-distended  ?MS: No  edema;  moves all extremities ?Integument: Skin feels warm ?Neuro:  At time of evaluation, alert and oriented to person/place/time/situation  ?Psych:  Normal affect, patient feels better ? ? ?Labs  ?  ?Chemistry ?Recent Labs  ?Lab 12/16/21 ?1619 12/16/21 ?1815 12/17/21 ?0118  ?NA 135  --  134*  ?K 4.0  --  4.5  ?CL 101  --  100  ?CO2 22  --  25  ?GLUCOSE 163*  --  246*  ?BUN 38*  --  40*  ?CREATININE 2.05*  --  1.97*  ?CALCIUM 9.8  --  9.3  ?PROT  --  6.7  --   ?ALBUMIN  --  3.5  --   ?AST  --  18  --   ?ALT  --  20  --   ?ALKPHOS  --  64  --   ?BILITOT  --  1.3*  --   ?GFRNONAA 34*  --  36*  ?ANIONGAP 12  --  9  ?  ? ?Hematology ?Recent Labs  ?Lab 12/16/21 ?1619 12/17/21 ?0118  ?WBC 11.4* 9.2  ?RBC 4.85 4.61  ?HGB 15.0 14.3  ?HCT 42.1 39.8  ?MCV 86.8 86.3  ?MCH 30.9 31.0  ?MCHC 35.6 35.9  ?RDW 13.1 13.3  ?PLT 208 178  ? ? ?Cardiac EnzymesNo results for input(s): TROPONINI in the last 168 hours. No results for input(s): TROPIPOC in the last 168 hours.  ? ?BNPNo  results for input(s): BNP, PROBNP in the last 168 hours.  ? ?DDimer No results for input(s): DDIMER in the last 168 hours.  ? ?Radiology  ?  ?DG Chest 2 View ? ?Result Date: 12/16/2021 ?CLINICAL DATA:  Chest pain and short of breath EXAM: CHEST - 2 VIEW COMPARISON:  01/28/2019 FINDINGS: Cardiac enlargement. Prior CABG. Negative for heart failure. Lungs clear without infiltrate or effusion. IMPRESSION: No active cardiopulmonary disease. Electronically Signed   By: Franchot Gallo M.D.   On: 12/16/2021 17:29   ? ? ?Patient Profile  ?   ?71 y.o. male hx of CAD s/p prior CABG (Patent LIMA to the LAD, Patent free radial graft arising from the mid LIMA graft and supplying the first diagonal and OM, known SVG -RCA Occlusion 10/26/18 LHC-G), ICM (EF 40-45%), HTN, HLD, DM, GERD, CKD stage IIIb presenting with unstable angina ? ?Assessment & Plan  ?  ?Unstable angina ?CAD with LIMA to the LAD, Patent free radial graft arising from the mid LIMA graft and supplying the first diagonal and OM ?HFmrEF with mitral regurgitation ?Hypertension with Diabetes ?CKD stage IIIB ?- ASA and Plavix ?- will review echo once completed;  needs contrast images, if worsening LVEF, will proceed to femoral LHC-G case, ?- Starting ranolazine 500 mg PO BID (was not on prior) ?- if stable echo and no residual pain, potential DC later today (would order heart healthy carb consistent diet) ?- rosuvastatin 40 mg PO daily, zetia 10 mg PO daily ?- Continue his entresto and Imdur  ?- SSI  ?- Full Code ?- DVT PPX- heparin ?- labs for tomorrow ?  ?For questions or updates, please contact [place] ?Please consult [email or web address] for contact info under Cardiology/STEMI. ?  ?   ?Signed, ?Werner Lean, MD  ?12/17/2021, 8:15 AM   ? ?

## 2021-12-17 NOTE — Plan of Care (Signed)
  Problem: Clinical Measurements: Goal: Will remain free from infection Outcome: Progressing   Problem: Health Behavior/Discharge Planning: Goal: Ability to manage health-related needs will improve Outcome: Not Progressing   

## 2021-12-17 NOTE — Interval H&P Note (Signed)
History and Physical Interval Note: ? ?12/17/2021 ?7:53 AM ? ?Russell Trevino  has presented today for surgery, with the diagnosis of unstable angina.  The various methods of treatment have been discussed with the patient and family. After consideration of risks, benefits and other options for treatment, the patient has consented to  Procedure(s): ?LEFT HEART CATH AND CORS/GRAFTS ANGIOGRAPHY (N/A) as a surgical intervention.  The patient's history has been reviewed, patient examined, no change in status, stable for surgery.  I have reviewed the patient's chart and labs.  Questions were answered to the patient's satisfaction.   ? ?Cath Lab Visit (complete for each Cath Lab visit) ? ?Clinical Evaluation Leading to the Procedure:  ? ?ACS: Yes.   ? ?Non-ACS:   ? ?Anginal Classification: CCS II ? ?Anti-ischemic medical therapy: Minimal Therapy (1 class of medications) ? ?Non-Invasive Test Results: No non-invasive testing performed ? ?Prior CABG: Previous CABG ? ? ? ? ? ? ? ?Russell Trevino ? ? ?

## 2021-12-17 NOTE — Discharge Summary (Addendum)
?Discharge Summary  ?  ?Patient ID: Russell Trevino ?MRN: [id number]; DOB: [birth year] ? ?Admit date: 12/16/2021 ?Discharge date: 12/17/2021 ? ?PCP:  No primary care provider on file. ?  ?[facility] Providers ?Cardiologist:  Buford Dresser, MD   { ? ?Discharge Diagnoses  ?  ?Active Problems: ?  Chest pain ?CAD ?Uncontrolled diabetes mellitus with hyperglycemia ?Hyperlipidemia ?Chronic kidney disease stage IIIb ?Hypertension ? ? ?Diagnostic Studies/Procedures  ?  ?LEFT HEART CATH AND CORS/GRAFTS ANGIOGRAPHY 12/17/2021  ? ?Conclusion ? ?  ?  Mid LM to Dist LM lesion is 100% stenosed. ?  Prox LAD lesion is 100% stenosed. ?  Prox Cx lesion is 100% stenosed. ?  Prox RCA to Dist RCA lesion is 100% stenosed. ?  Dist LAD lesion is 60% stenosed. ?  LV end diastolic pressure is normal. ?  ?1.  Patent LIMA to LAD with radial off the LIMA graft to diagonal and obtuse marginal branches. ?2.  Vein graft right coronary artery known occluded and not imaged. ?3.  Severe native vessel disease with known occlusion of right coronary artery which was not imaged on the study and newly occluded left main lesion. ?4.  LVEDP of 16 mmHg. ?  ?The results were reviewed with Dr. Gasper Sells and optimal medical therapy will be pursued. ? ?Diagnostic ?Dominance: Right ? ?Echo 12/17/2021 ? 1. Left ventricular ejection fraction, by estimation, is 20 to 25%. The  ?left ventricle has severely decreased function. The left ventricle  ?demonstrates regional wall motion abnormalities (see scoring  ?diagram/findings for description). The left  ?ventricular internal cavity size was moderately dilated. Left ventricular  ?diastolic parameters are indeterminate.  ? 2. Right ventricular systolic function is moderately reduced. The right  ?ventricular size is normal.  ? 3. Left atrial size was mildly dilated.  ? 4. The mitral valve is grossly normal. Trivial mitral valve  ?regurgitation. No evidence of mitral stenosis.  ? 5. The aortic valve was not well  visualized. Aortic valve regurgitation  ?is not visualized. No aortic stenosis is present.  ? 6. Aortic dilatation noted. There is mild dilatation of the ascending  ?aorta, measuring 41 mm.  ? ?Comparison(s): Changes from prior study are noted.  ? ?Conclusion(s)/Recommendation(s): Wall motion better seen on current study.  ?Diffuse hypokinesis with akinesis predominantly in the inferoseptal and  ?inferior walls. No evidence of LV thrombus. Severely reduced LVEF, 20-25%.  ?  ?History of Present Illness   ?  ?Russell Trevino is a 71 y.o. male with PMH of CAD s/p prior CABG (Patent LIMA to the LAD, Patent free radial graft arising from the mid LIMA graft and supplying the first diagonal and OM, known SVG -RCA Occlusion 10/26/18 LHC-G), ICM (EF 40-45%), HTN, HLD, DM, GERD, CKD stage IIIb who is presented for the evaluation of chest pain. ? ?Patient presented for evaluation of chest discomfort.  He thought his blood sugar was low and took Arabic pill but symptoms does not improve.  Unsure if patient was taking his medication or not.  Given symptoms concerning for unstable angina heat was admitted. Troponin negative. Scr 1.97. HgbA1c 11.2. TSH norma. ? ?Hospital Course  ?   ?Consultants: None  ? ?The patient is admitted for symptoms concerning for unstable angina.  Continued home aspirin and Plavix.  No recurrent chest pain during admission.  Echocardiogram showed reduced LV function at 20 to 25% with regional wall motion abnormalities. Cath as: ?1. Patent LIMA to LAD with radial off the LIMA graft to diagonal and obtuse  marginal branches. ?2.  Vein graft right coronary artery known occluded and not imaged. ?3.  Severe native vessel disease with known occlusion of right coronary artery which was not imaged on the study and newly occluded left main lesion. ?4.  LVEDP of 16 mmHg. ? ?Recommended medical therapy.  He ambulated well after TR band removal.  He insisted to go home.  He was felt stable to discharge. Will continue  current medications. Rx sent to [facility] (all current cardiac medications as unsure what medication he was taking at home). Close follow up has been arranged.   ? ?The patient been seen by Dr. Gasper Sells today and deemed ready for discharge home. All follow-up appointments have been scheduled. Discharge medications are listed below.   ? ?Did the patient have an acute coronary syndrome (MI, NSTEMI, STEMI, etc) this admission?:  No                               ?Did the patient have a percutaneous coronary intervention (stent / angioplasty)?:  No.   ? ?The patient will be scheduled for a TOC follow up appointment in 14 days.  A message has been sent to the [facility] and Scheduling Pool at the office where the patient should be seen for follow up.  ? ? ?Discharge Vitals ?Blood pressure (!) 121/56, pulse 73, temperature 98 ?F (36.7 ?C), temperature source Oral, resp. rate 14, height 6' (1.829 m), weight 106.6 kg, SpO2 97 %.  ?Filed Weights  ? 12/16/21 1702 12/16/21 2228 12/17/21 0627  ?Weight: 105.2 kg 106.2 kg 106.6 kg  ? ? ?Labs & Radiologic Studies  ?  ?CBC ?Recent Labs  ?  12/16/21 ?1619 12/17/21 ?0118  ?WBC 11.4* 9.2  ?HGB 15.0 14.3  ?HCT 42.1 39.8  ?MCV 86.8 86.3  ?PLT 208 178  ? ?Basic Metabolic Panel ?Recent Labs  ?  12/16/21 ?1619 12/17/21 ?0118  ?NA 135 134*  ?K 4.0 4.5  ?CL 101 100  ?CO2 22 25  ?GLUCOSE 163* 246*  ?BUN 38* 40*  ?CREATININE 2.05* 1.97*  ?CALCIUM 9.8 9.3  ? ?Liver Function Tests ?Recent Labs  ?  12/16/21 ?1815  ?AST 18  ?ALT 20  ?ALKPHOS 64  ?BILITOT 1.3*  ?PROT 6.7  ?ALBUMIN 3.5  ? ?Recent Labs  ?  12/16/21 ?1815  ?LIPASE 31  ? ?High Sensitivity Troponin:   ?Recent Labs  ?Lab 12/16/21 ?1619 12/16/21 ?1815  ?TROPONINIHS 19* 16  ? ?Hemoglobin A1C ?Recent Labs  ?  12/17/21 ?0118  ?HGBA1C 11.2*  ? ?Fasting Lipid Panel ?Recent Labs  ?  12/17/21 ?0118  ?CHOL 345*  ?HDL 46  ?LDLCALC 231*  ?TRIG 338*  ?CHOLHDL 7.5  ? ?Thyroid Function Tests ?Recent Labs  ?  12/17/21 ?0118  ?TSH 1.259   ? ?_____________  ?DG Chest 2 View ? ?Result Date: 12/16/2021 ?CLINICAL DATA:  Chest pain and short of breath EXAM: CHEST - 2 VIEW COMPARISON:  01/28/2019 FINDINGS: Cardiac enlargement. Prior CABG. Negative for heart failure. Lungs clear without infiltrate or effusion. IMPRESSION: No active cardiopulmonary disease. Electronically Signed   By: Franchot Gallo M.D.   On: 12/16/2021 17:29  ? ?CARDIAC CATHETERIZATION ? ?Result Date: 12/17/2021 ?  Mid LM to Dist LM lesion is 100% stenosed.   Prox LAD lesion is 100% stenosed.   Prox Cx lesion is 100% stenosed.   Prox RCA to Dist RCA lesion is 100% stenosed.   Dist LAD  lesion is 60% stenosed.   LV end diastolic pressure is normal. 1.  Patent LIMA to LAD with radial off the LIMA graft to diagonal and obtuse marginal branches. 2.  Vein graft right coronary artery known occluded and not imaged. 3.  Severe native vessel disease with known occlusion of right coronary artery which was not imaged on the study and newly occluded left main lesion. 4.  LVEDP of 16 mmHg. The results were reviewed with Dr. Gasper Sells and optimal medical therapy will be pursued. ? ?ECHOCARDIOGRAM COMPLETE ? ?Result Date: 12/17/2021 ?   ECHOCARDIOGRAM REPORT   Patient Name:   Russell Trevino Date of Exam: 12/17/2021 Medical Rec #:  [id number]       Height:       72.0 in Accession #:    [id number]      Weight:       235.0 lb Date of Birth:  1951/06/03      BSA:          2.282 m? Patient Age:    17 years        BP:           144/71 mmHg Patient Gender: M               HR:           73 bpm. Exam Location:  Inpatient Procedure: 2D Echo and Intracardiac Opacification Agent Indications:    Chest pain  History:        Patient has prior history of Echocardiogram examinations, most                 recent 10/26/2018. Cardiomyopathy and CHF, CAD and Previous                 Myocardial Infarction; Risk Factors:Diabetes.  Sonographer:    Arlyss Gandy Referring Phys: [place] Comments: Image  acquisition challenging due to patient body habitus. IMPRESSIONS  1. Left ventricular ejection fraction, by estimation, is 20 to 25%. The left ventricle has severely decreased function. The left ventricle demonstrat

## 2021-12-17 NOTE — H&P (View-Only) (Signed)
? ?Progress Note ? ?Patient Name: Russell Trevino ?Date of Encounter: 12/17/2021 ? ?Primary Cardiologist: Buford Dresser, MD  ? ?Subjective  ? ?Decreased chest discomfort in interim.  Creatinine stable. ?Echo has started, LVEF is moderate decreased septal hypokinesis that is also seen in prior study. ? ?Inpatient Medications  ?  ?Scheduled Meds: ? aspirin  81 mg Oral Daily  ? clopidogrel  75 mg Oral Daily  ? ezetimibe  10 mg Oral QPM  ? heparin  5,000 Units Subcutaneous Q8H  ? insulin aspart  0-15 Units Subcutaneous TID WC  ? isosorbide mononitrate  120 mg Oral BID  ? metoprolol succinate  50 mg Oral Q1200  ? pantoprazole  40 mg Oral Daily  ? rosuvastatin  40 mg Oral Daily  ? sacubitril-valsartan  1 tablet Oral BID  ? ?Continuous Infusions: ? sodium chloride 1 mL/kg/hr (12/17/21 0526)  ? ?PRN Meds: ?acetaminophen, nitroGLYCERIN, ondansetron (ZOFRAN) IV  ? ?Vital Signs  ?  ?Vitals:  ? 12/17/21 0109 12/17/21 2353 12/17/21 6144 12/17/21 0724  ?BP: 131/69 (!) 141/71  (!) 144/71  ?Pulse: 81 76  73  ?Resp: '18 15  14  '$ ?Temp: 97.9 ?F (36.6 ?C) 97.8 ?F (36.6 ?C)  98 ?F (36.7 ?C)  ?TempSrc: Oral Oral  Oral  ?SpO2: 99% 97%  98%  ?Weight:   106.6 kg   ?Height:      ? ? ?Intake/Output Summary (Last 24 hours) at 12/17/2021 0815 ?Last data filed at 12/17/2021 3154 ?Gross per 24 hour  ?Intake 0 ml  ?Output 625 ml  ?Net -625 ml  ? ?Filed Weights  ? 12/16/21 1702 12/16/21 2228 12/17/21 0627  ?Weight: 105.2 kg 106.2 kg 106.6 kg  ? ? ?Telemetry  ?  ?SR with PVC - Personally Reviewed ? ?Physical Exam  ? ?Gen: no distress   ?Neck: No JVD ?Ears: bilateral Pilar Plate Sign ?Cardiac: No Rubs or Gallops, no Murmur, RRR, +2 R  radial pulses, distant heart sounds ?Respiratory: Clear to auscultation bilaterally, normal effort, normal  respiratory rate ?GI: Soft, nontender, non-distended  ?MS: No  edema;  moves all extremities ?Integument: Skin feels warm ?Neuro:  At time of evaluation, alert and oriented to person/place/time/situation  ?Psych:  Normal affect, patient feels better ? ? ?Labs  ?  ?Chemistry ?Recent Labs  ?Lab 12/16/21 ?1619 12/16/21 ?1815 12/17/21 ?0118  ?NA 135  --  134*  ?K 4.0  --  4.5  ?CL 101  --  100  ?CO2 22  --  25  ?GLUCOSE 163*  --  246*  ?BUN 38*  --  40*  ?CREATININE 2.05*  --  1.97*  ?CALCIUM 9.8  --  9.3  ?PROT  --  6.7  --   ?ALBUMIN  --  3.5  --   ?AST  --  18  --   ?ALT  --  20  --   ?ALKPHOS  --  64  --   ?BILITOT  --  1.3*  --   ?GFRNONAA 34*  --  36*  ?ANIONGAP 12  --  9  ?  ? ?Hematology ?Recent Labs  ?Lab 12/16/21 ?1619 12/17/21 ?0118  ?WBC 11.4* 9.2  ?RBC 4.85 4.61  ?HGB 15.0 14.3  ?HCT 42.1 39.8  ?MCV 86.8 86.3  ?MCH 30.9 31.0  ?MCHC 35.6 35.9  ?RDW 13.1 13.3  ?PLT 208 178  ? ? ?Cardiac EnzymesNo results for input(s): TROPONINI in the last 168 hours. No results for input(s): TROPIPOC in the last 168 hours.  ? ?BNPNo  results for input(s): BNP, PROBNP in the last 168 hours.  ? ?DDimer No results for input(s): DDIMER in the last 168 hours.  ? ?Radiology  ?  ?DG Chest 2 View ? ?Result Date: 12/16/2021 ?CLINICAL DATA:  Chest pain and short of breath EXAM: CHEST - 2 VIEW COMPARISON:  01/28/2019 FINDINGS: Cardiac enlargement. Prior CABG. Negative for heart failure. Lungs clear without infiltrate or effusion. IMPRESSION: No active cardiopulmonary disease. Electronically Signed   By: Franchot Gallo M.D.   On: 12/16/2021 17:29   ? ? ?Patient Profile  ?   ?71 y.o. male hx of CAD s/p prior CABG (Patent LIMA to the LAD, Patent free radial graft arising from the mid LIMA graft and supplying the first diagonal and OM, known SVG -RCA Occlusion 10/26/18 LHC-G), ICM (EF 40-45%), HTN, HLD, DM, GERD, CKD stage IIIb presenting with unstable angina ? ?Assessment & Plan  ?  ?Unstable angina ?CAD with LIMA to the LAD, Patent free radial graft arising from the mid LIMA graft and supplying the first diagonal and OM ?HFmrEF with mitral regurgitation ?Hypertension with Diabetes ?CKD stage IIIB ?- ASA and Plavix ?- will review echo once completed;  needs contrast images, if worsening LVEF, will proceed to femoral LHC-G case, ?- Starting ranolazine 500 mg PO BID (was not on prior) ?- if stable echo and no residual pain, potential DC later today (would order heart healthy carb consistent diet) ?- rosuvastatin 40 mg PO daily, zetia 10 mg PO daily ?- Continue his entresto and Imdur  ?- SSI  ?- Full Code ?- DVT PPX- heparin ?- labs for tomorrow ?  ?For questions or updates, please contact [place] ?Please consult [email or web address] for contact info under Cardiology/STEMI. ?  ?   ?Signed, ?Werner Lean, MD  ?12/17/2021, 8:15 AM   ? ?

## 2021-12-20 ENCOUNTER — Encounter (HOSPITAL_COMMUNITY): Payer: Self-pay | Admitting: Internal Medicine

## 2021-12-20 MED FILL — Verapamil HCl IV Soln 2.5 MG/ML: INTRAVENOUS | Qty: 2 | Status: AC

## 2021-12-23 ENCOUNTER — Encounter: Payer: Self-pay | Admitting: Internal Medicine

## 2021-12-23 NOTE — Progress Notes (Signed)
? ? ?Subjective:  ? ? Patient ID: Russell Trevino, male    DOB: 06-11-1951, 71 y.o.   MRN: [id number] ? ?This visit occurred during the SARS-CoV-2 public health emergency.  Safety protocols were in place, including screening questions prior to the visit, additional usage of staff PPE, and extensive cleaning of exam room while observing appropriate contact time as indicated for disinfecting solutions. ? ? ? ?HPI ?Tripton is here for  ?Chief Complaint  ?Patient presents with  ? Dizziness  ? ?He was recently in the hospital for chest pain and had a cardiac catheterization. ? ?lightheadedness - it started once he left the hospital.  He has no energy since being home.  He is having difficulty functioning.  He was in the bed for 2-3 days.  He could not be up for a long period of time.   ? ?When he was in the hospital he got up to go to the bathroom - blood was in the bed.  He had bruising into his Blood in bed. Genital area all bruised.  The bruising was significant and he did have a lot of pain so that is why he was in the bed in addition to the lightheadedness.  The bruising has not gotten worse.  The pain from the bruising is a little better. ? ? ?Sugars 40 - 460. Since after the hospital - before that they were better.  He purchases his insulin from [facility]- he is not sure what he is taking.   ? ? ?Medications and allergies reviewed with patient and updated if appropriate. ? ?Current Outpatient Medications on File Prior to Visit  ?Medication Sig Dispense Refill  ? aspirin 81 MG EC tablet Take 1 tablet (81 mg total) by mouth in the morning. 30 tablet 6  ? clopidogrel (PLAVIX) 75 MG tablet Take 1 tablet (75 mg total) by mouth daily. 30 tablet 6  ? cyclobenzaprine (FLEXERIL) 10 MG tablet Take 10 mg by mouth at bedtime as needed for muscle spasms.    ? ezetimibe (ZETIA) 10 MG tablet Take 1 tablet (10 mg total) by mouth every evening. 90 tablet 1  ? finasteride (PROSCAR) 5 MG tablet TAKE 1 TABLET(5 MG) BY MOUTH DAILY  (Patient taking differently: Take 5 mg by mouth at bedtime.) 90 tablet 1  ? furosemide (LASIX) 40 MG tablet Take 1 tablet (40 mg total) by mouth as needed for fluid or edema. 30 tablet 6  ? gabapentin (NEURONTIN) 100 MG capsule Take 1 cap in morning and 2 cap HS (Patient taking differently: 100 mg daily as needed (for neuropathy).) 90 capsule 3  ? Insulin Syringe-Needle U-100 (BD VEO INSULIN SYRINGE U/F) 31G X 15/64" 1 ML MISC use as directed TO INJECT INSULIN  twice a day 10 each 2  ? isosorbide mononitrate (IMDUR) 120 MG 24 hr tablet Take 1 tablet (120 mg total) by mouth 2 (two) times daily. 180 tablet 3  ? metoprolol succinate (TOPROL-XL) 50 MG 24 hr tablet Take 1 tablet (50 mg total) by mouth daily at 12 noon. Take with or immediately following a meal. 90 tablet 3  ? nitroGLYCERIN (NITROSTAT) 0.4 MG SL tablet DISSOLVE 1 TABLET UNDER THE TONGUE EVERY 5 MINUTES AS NEEDED FOR CHEST PAIN. CALL MD/ 911 IF TAKE 2 DOSES. MAX 3 DOSES PER DAY 100 tablet 0  ? ranolazine (RANEXA) 500 MG 12 hr tablet Take 1 tablet (500 mg total) by mouth 2 (two) times daily. 180 tablet 3  ? rosuvastatin (CRESTOR) 40 MG  tablet Take 1 tablet (40 mg total) by mouth daily. 90 tablet 3  ? sacubitril-valsartan (ENTRESTO) 49-51 MG Take 1 tablet by mouth 2 (two) times daily. 60 tablet 6  ? Semaglutide,0.25 or 0.'5MG'$ /DOS, (OZEMPIC, 0.25 OR 0.5 MG/DOSE,) 2 MG/1.5ML SOPN Inject 0.5 mg into the skin every Wednesday.    ? ?No current facility-administered medications on file prior to visit.  ? ? ?Review of Systems  ?Constitutional:  Positive for fatigue. Negative for fever.  ?Eyes:  Negative for visual disturbance.  ?Respiratory:  Negative for cough, shortness of breath and wheezing.   ?Cardiovascular:  Negative for chest pain, palpitations and leg swelling.  ?Gastrointestinal:  Positive for nausea. Negative for abdominal pain.  ?Neurological:  Positive for light-headedness and headaches. Negative for dizziness.  ? ?   ?Objective:  ? ?Vitals:  ? 12/24/21  0906  ?BP: 130/72  ?Pulse: 74  ?Temp: 98.1 ?F (36.7 ?C)  ?SpO2: 98%  ? ?BP Readings from Last 3 Encounters:  ?12/24/21 130/72  ?12/17/21 (!) 121/56  ?08/18/21 132/72  ? ?Wt Readings from Last 3 Encounters:  ?12/24/21 240 lb (108.9 kg)  ?12/17/21 235 lb 0.2 oz (106.6 kg)  ?08/18/21 232 lb (105.2 kg)  ? ?Body mass index is 32.55 kg/m?. ? ?  ?Physical Exam ?Constitutional:   ?   General: He is not in acute distress. ?   Appearance: Normal appearance. He is not ill-appearing.  ?HENT:  ?   Head: Normocephalic and atraumatic.  ?Eyes:  ?   Conjunctiva/sclera: Conjunctivae normal.  ?Cardiovascular:  ?   Rate and Rhythm: Normal rate and regular rhythm.  ?   Heart sounds: Normal heart sounds. No murmur heard. ?Pulmonary:  ?   Effort: Pulmonary effort is normal. No respiratory distress.  ?   Breath sounds: Normal breath sounds. No wheezing or rales.  ?Musculoskeletal:  ?   Right lower leg: No edema.  ?   Left lower leg: No edema.  ?Skin: ?   General: Skin is warm and dry.  ?   Findings: No rash.  ?Neurological:  ?   Mental Status: He is alert. Mental status is at baseline.  ?Psychiatric:     ?   Mood and Affect: Mood normal.  ? ?   ? ? ? ? ? ?Assessment & Plan:  ? ? ?See Problem List for Assessment and Plan of chronic medical problems.  ? ? ? ? ?

## 2021-12-24 ENCOUNTER — Ambulatory Visit (INDEPENDENT_AMBULATORY_CARE_PROVIDER_SITE_OTHER): Payer: Medicare Other | Admitting: Internal Medicine

## 2021-12-24 VITALS — BP 130/72 | HR 74 | Temp 98.1°F | Ht 72.0 in | Wt 240.0 lb

## 2021-12-24 DIAGNOSIS — N1832 Chronic kidney disease, stage 3b: Secondary | ICD-10-CM | POA: Diagnosis not present

## 2021-12-24 DIAGNOSIS — R42 Dizziness and giddiness: Secondary | ICD-10-CM

## 2021-12-24 DIAGNOSIS — Z794 Long term (current) use of insulin: Secondary | ICD-10-CM

## 2021-12-24 DIAGNOSIS — I10 Essential (primary) hypertension: Secondary | ICD-10-CM | POA: Diagnosis not present

## 2021-12-24 DIAGNOSIS — E1122 Type 2 diabetes mellitus with diabetic chronic kidney disease: Secondary | ICD-10-CM | POA: Diagnosis not present

## 2021-12-24 LAB — COMPREHENSIVE METABOLIC PANEL
ALT: 16 U/L (ref 0–53)
AST: 18 U/L (ref 0–37)
Albumin: 3.8 g/dL (ref 3.5–5.2)
Alkaline Phosphatase: 59 U/L (ref 39–117)
BUN: 44 mg/dL — ABNORMAL HIGH (ref 6–23)
CO2: 25 mEq/L (ref 19–32)
Calcium: 8.9 mg/dL (ref 8.4–10.5)
Chloride: 97 mEq/L (ref 96–112)
Creatinine, Ser: 2.25 mg/dL — ABNORMAL HIGH (ref 0.40–1.50)
GFR: 28.62 mL/min — ABNORMAL LOW (ref >60.00)
Glucose, Bld: 150 mg/dL — ABNORMAL HIGH (ref 70–99)
Potassium: 4.1 mEq/L (ref 3.5–5.1)
Sodium: 130 mEq/L — ABNORMAL LOW (ref 135–145)
Total Bilirubin: 1.1 mg/dL (ref 0.2–1.2)
Total Protein: 6.6 g/dL (ref 6.0–8.3)

## 2021-12-24 LAB — CBC WITH DIFFERENTIAL/PLATELET
Basophils Absolute: 0 10*3/uL (ref 0.0–0.1)
Basophils Relative: 0.5 % (ref 0.0–3.0)
Eosinophils Absolute: 0.1 10*3/uL (ref 0.0–0.7)
Eosinophils Relative: 1.3 % (ref 0.0–5.0)
HCT: 41.7 % (ref 39.0–52.0)
Hemoglobin: 14.1 g/dL (ref 13.0–17.0)
Lymphocytes Relative: 16.1 % (ref 12.0–46.0)
Lymphs Abs: 1.6 10*3/uL (ref 0.7–4.0)
MCHC: 33.9 g/dL (ref 30.0–36.0)
MCV: 90.3 fl (ref 78.0–100.0)
Monocytes Absolute: 0.8 10*3/uL (ref 0.1–1.0)
Monocytes Relative: 8.5 % (ref 3.0–12.0)
Neutro Abs: 7.1 10*3/uL (ref 1.4–7.7)
Neutrophils Relative %: 73.6 % (ref 43.0–77.0)
Platelets: 184 10*3/uL (ref 150.0–400.0)
RBC: 4.61 Mil/uL (ref 4.22–5.81)
RDW: 14 % (ref 11.5–15.5)
WBC: 9.7 10*3/uL (ref 4.0–10.5)

## 2021-12-24 MED ORDER — LANTUS SOLOSTAR 100 UNIT/ML ~~LOC~~ SOPN: 30.0000 [IU] | PEN_INJECTOR | Freq: Two times a day (BID) | SUBCUTANEOUS | 5 refills | Status: DC

## 2021-12-24 MED ORDER — INSULIN PEN NEEDLE 31G X 6 MM MISC: 5 refills | Status: AC

## 2021-12-24 NOTE — Patient Instructions (Addendum)
? ? ? ?  Blood work was ordered.   ? ? ?Medications changes include :   lantus (long acting insulin) 30 units twice daily ? ? ?Your prescription(s) have been sent to your pharmacy.  ? ? ?A referral was ordered for endocrine.     Someone from that office will call you to schedule an appointment.  ? ? ? ?

## 2021-12-24 NOTE — Assessment & Plan Note (Signed)
Chronic  ?poorly controlled ?He is seeing eye doctor in [place] and does not follow-up with me regularly ?Has been purchasing insulin on his own and he is not sure what he is eating and taking ?He is taking the Ozempic 0.5 mg weekly-dose has apparently not been increased because he has not followed up with his doctor down in [place] ?Stressed compliance with medication and follow-up ?Continue Ozempic 0.5 mg weekly ?Start Lantus 30 units twice daily, likely needs a short acting insulin, but is not sure what he has-likely regular insulin ?Monitor sugars ?Will refer to endocrine ?Stressed diabetic diet, eating regularly and following up ? ?

## 2021-12-24 NOTE — Assessment & Plan Note (Signed)
Chronic ?Blood pressure well controlled ?CMP ?Continue Imdur 120 mg twice daily, metoprolol XL 50 mg daily Entresto 49-51 mg twice daily ?

## 2021-12-24 NOTE — Assessment & Plan Note (Signed)
Acute ?Started after he left the hospital ?Significant lightheadedness and fatigue is making it difficult for her to function ??  Related to bleeding internally from the right groin-he did have significant bruising of his scrotum and penis, which has not worsened in the past few days.  He has been in the hospital was also full of blood ??  Related to sugars, not eating or drinking enough ?Check CBC, CMP ?Stressed pushing water ?Further treatment/advice depending on blood work ?

## 2021-12-28 ENCOUNTER — Ambulatory Visit (HOSPITAL_BASED_OUTPATIENT_CLINIC_OR_DEPARTMENT_OTHER): Payer: Medicare Other | Admitting: Family

## 2021-12-28 ENCOUNTER — Encounter (HOSPITAL_BASED_OUTPATIENT_CLINIC_OR_DEPARTMENT_OTHER): Payer: Self-pay | Admitting: Family

## 2021-12-28 VITALS — BP 150/86 | HR 71 | Ht 72.0 in | Wt 246.0 lb

## 2021-12-28 DIAGNOSIS — Z951 Presence of aortocoronary bypass graft: Secondary | ICD-10-CM

## 2021-12-28 DIAGNOSIS — I25118 Atherosclerotic heart disease of native coronary artery with other forms of angina pectoris: Secondary | ICD-10-CM

## 2021-12-28 DIAGNOSIS — I5022 Chronic systolic (congestive) heart failure: Secondary | ICD-10-CM

## 2021-12-28 DIAGNOSIS — E785 Hyperlipidemia, unspecified: Secondary | ICD-10-CM | POA: Diagnosis not present

## 2021-12-28 DIAGNOSIS — I25119 Atherosclerotic heart disease of native coronary artery with unspecified angina pectoris: Secondary | ICD-10-CM | POA: Diagnosis not present

## 2021-12-28 DIAGNOSIS — I5042 Chronic combined systolic (congestive) and diastolic (congestive) heart failure: Secondary | ICD-10-CM | POA: Diagnosis not present

## 2021-12-28 MED ORDER — ROSUVASTATIN CALCIUM 40 MG PO TABS: 40.0000 mg | ORAL_TABLET | Freq: Every day | ORAL | 3 refills | Status: DC

## 2021-12-28 MED ORDER — ISOSORBIDE MONONITRATE ER 120 MG PO TB24: 120.0000 mg | ORAL_TABLET | Freq: Two times a day (BID) | ORAL | 3 refills | Status: DC

## 2021-12-28 MED ORDER — FUROSEMIDE 40 MG PO TABS: 40.0000 mg | ORAL_TABLET | Freq: Every day | ORAL | 1 refills | Status: DC

## 2021-12-28 MED ORDER — ASPIRIN 81 MG PO TBEC: 81.0000 mg | DELAYED_RELEASE_TABLET | Freq: Every morning | ORAL | 3 refills | Status: DC

## 2021-12-28 MED ORDER — ENTRESTO 49-51 MG PO TABS: 1.0000 | ORAL_TABLET | Freq: Two times a day (BID) | ORAL | 6 refills | Status: DC

## 2021-12-28 MED ORDER — EZETIMIBE 10 MG PO TABS: 10.0000 mg | ORAL_TABLET | Freq: Every evening | ORAL | 1 refills | Status: DC

## 2021-12-28 MED ORDER — METOPROLOL SUCCINATE ER 50 MG PO TB24: 50.0000 mg | ORAL_TABLET | Freq: Every day | ORAL | 3 refills | Status: DC

## 2021-12-28 NOTE — Patient Instructions (Addendum)
Medication Instructions:  ?Your physician has recommended you make the following change in your medication:   ? ?HOLD Clopidogrel (Plavix) for 5 days. ?*Update [place] on bruising on Monday and we will tell you whether to resume ? ?CONTINUE Aspirin  ? ?CHANGE Furosemide to '40mg'$  daily ? ?*If you need a refill on your cardiac medications before your next appointment, please call your pharmacy* ? ? ?Lab Work: ?Your physician recommends that you return for lab work in 1 week for BMP, BNP.  ? ?Please return for Lab work. You may come to the...  ? ?[facility] (3rd floor) ?[address] [place], [place]  ?Open: 8am-Noon and 1pm-4:30pm  ? ?[facility] ?[place]  ? ?[employer]- Any location ? ?**no appointments needed**  ? ?If you have labs (blood work) drawn today and your tests are completely normal, you will receive your results only by: ?MyChart Message (if you have MyChart) OR ?A paper copy in the mail ?If you have any lab test that is abnormal or we need to change your treatment, we will call you to review the results. ? ? ?Testing/Procedures: ?Your EKG today showed normal sinus rhythm which is a good result.  ? ?Your echocardiogram in the hospital showed your heart pumping function was reduced. Normal is 50-65% and yours was 20-25%. We strengthen the heart muscle by using medications like Entresto, Lasix, Metoprolol, and more. ? ?Your cardiac catheterization was similar to previous. We know your native heart arteries are blocked but your bypass grafts are working well and your body has built new blood vessels to help your heart get better blood flow.  ? ?Follow-Up: ?At [facility], you and your health needs are our priority.  As part of our continuing mission to provide you with exceptional heart care, we have created designated Provider Care Teams.  These Care Teams include your primary Cardiologist (physician) and Advanced Practice Providers  (APPs -  Physician Assistants and Nurse Practitioners) who all work together to provide you with the care you need, when you need it. ? ?We recommend signing up for the patient portal called "MyChart".  Sign up information is provided on this After Visit Summary.  MyChart is used to connect with patients for Virtual Visits (Telemedicine).  Patients are able to view lab/test results, encounter notes, upcoming appointments, etc.  Non-urgent messages can be sent to your provider as well.   ?To learn more about what you can do with MyChart, go to [email or web address].   ? ?Your next appointment:   ?1 month(s) ? ?The format for your next appointment:   ?In Person ? ?Provider:   ?Buford Dresser, MD  ? ? ?Other Instructions ? ?Recommend weighing daily and keeping a log. Please call our office if you have weight gain of 2 pounds overnight or 5 pounds in 1 week.  ? ?Date ? Time Weight  ? ?    ? ?    ? ?    ? ?    ? ?    ? ?    ? ?    ? ?    ?  ?You have been referred to cardiac rehab. This is a combination program including monitored exercise, dietary education, and support group. We strongly recommend participating in the program. Expect a phone call from them in approximately 3 weeks.  ? ? ?Heart Healthy Diet Recommendations: ?A low-salt diet is recommended. Meats should be grilled, baked, or boiled. Avoid fried foods.  Focus on lean protein sources like fish or chicken with vegetables and fruits. The American Heart Association is a Microbiologist!  American Heart Association Diet and Lifeystyle Recommendations  ? ?Exercise recommendations: ?The American Heart Association recommends 150 minutes of moderate intensity exercise weekly. ?Try 30 minutes of moderate intensity exercise 4-5 times per week. ?This could include walking, jogging, or swimming. ? ? ?Important Information About Sugar ? ? ? ? ?  ?

## 2021-12-28 NOTE — Progress Notes (Signed)
? ?Office Visit  ?  ?Patient Name: Russell Trevino ?Date of Encounter: 12/28/2021 ? ?PCP:  Binnie Rail, MD ?  ?[place]  ?Cardiologist:  Buford Dresser, MD  ?Advanced Practice Provider:  No care team member to display ?Electrophysiologist:  None  ?   ? ?Chief Complaint  ?  ?Russell Trevino is a 71 y.o. male with a hx of CAD s/p CABG, ischemic cardiomyopathy, HFrEF, hypertension, hyperlipidemia, DM 2, GERD, CKD 3B presents today for hospital follow-up ? ?Past Medical History  ?  ?Past Medical History:  ?Diagnosis Date  ? BPH (benign prostatic hypertrophy)   ? Cardiomyopathy, ischemic   ? Coronary artery disease   ? at [facility], [place] 2009 (GRAFTS PATENT), cath 05/2018 w/ SVG-PDA 100%>>med rx  ? Diabetes mellitus, type 2 ([place])   ? Frequency of urination   ? GERD (gastroesophageal reflux disease)   ? Gross hematuria   ? History of CHF (congestive heart failure) 2007  ? History of kidney stones   ? History of myocardial infarction 2006, 2019  ? 2006>>CABG, 2019 cath w/ med rx  ? History of urinary retention   ? Hyperlipidemia   ? Nocturia   ? Renal calculi   ? BILATERAL PER CT SCAN--  NON-OBSTRUCTIVE  ? S/P CABG x 4 2006  ? ?Past Surgical History:  ?Procedure Laterality Date  ? CARDIAC CATHETERIZATION  01/17/2008  ? GRAFTS PATENT / EF 40%,  DR SANTOS ([facility])  ? CORONARY ARTERY BYPASS GRAFT  04/2005  ? LIMA-LAD, L radial-OM-D1 and SVG-PDA  ? CYSTO / RIGHT URETERAL STENT PLACEMENT  10-08-1999  ? CYSTO/  REMOVAL BLADDER STONES  01-14-2010  ? CYSTOSCOPY  10/16/2012  ? Procedure: CYSTOSCOPY;  Surgeon: Malka So, MD;  Location[facility];  Service: Urology;  Laterality: N/A;  Cystoscopy with Clot Evacuation and fulguration bladder neck.  ? EXTRACORPOREAL SHOCK WAVE LITHOTRIPSY  10-15-2009  ? RIGHT  ? KNEE ARTHROSCOPY  1978  ? RIGHT  ? LEFT HEART CATH AND CORS/GRAFTS ANGIOGRAPHY N/A 12/17/2021  ? Procedure: LEFT HEART CATH AND CORS/GRAFTS ANGIOGRAPHY;  Surgeon: [name], MD;  Location: [facility];  Service: Cardiovascular;  Laterality: N/A;  ? LEFT SHOULDER SURGERY   2002  ? LUMBAR FUSION  09-10-2008  ? L4 - L5  ? PERCUTANEOUS NEPHROSTOLITHOTOMY  1979  ? PROSTATE BIOPSY  10/16/2012  ? Procedure: PROSTATE BIOPSY;  Surgeon: Malka So, MD;  Location: [facility];  Service: Urology;  Laterality: N/A;  Through ultrasound.  ? RIGHT/LEFT HEART CATH AND CORONARY/GRAFT ANGIOGRAPHY N/A 10/26/2018  ? Procedure: RIGHT/LEFT HEART CATH AND CORONARY/GRAFT ANGIOGRAPHY;  Surgeon: Martinique, Peter M, MD;  Location: [facility];  Service: Cardiovascular;  Laterality: N/A;  ? SHOULDER ARTHROSCOPY W/ SUBACROMIAL DECOMPRESSION AND DISTAL CLAVICLE EXCISION  10-11-2004  ? AND PARTIAL ROTATOR CUFF REPAIR  ? TRANSTHORACIC ECHOCARDIOGRAM  01/09/2012  ? LV SYSTOLIC FUNCTION MILDLY REDUCED/ EF 48%/ LV FILLING PATTERN  IS IMPAIRED/ HYPOKINESIS IN THE MID AND BASALAR SEPTUM & ANTEROSEPTUM  ? TURP VAPORIZATION  01-19-2010  ? BPH W/ BOO  ? URETEROLITHOTOMY  1976  ? ? ?Allergies ? ?No Known Allergies ? ?History of Present Illness  ?  ?Russell Trevino is a 71 y.o. male with a hx of CAD s/p CABG, ischemic cardiomyopathy, HFrEF, hypertension, hyperlipidemia, DM 2, GERD, CKD 3B  last seen while hospitalized. ? ?Admitted 12/16/2021 after presenting with chest pain.  It was unclear which  of his medications he was or was not taking.  Echo 12/11/2021 LVEF 25% with septal and inferior wall motion abnormalities worse than previous.  He underwent LHC showing native left main occlusion with patent arterial graft.  Plan was to potentially add ARNI in outpatient setting.  SGLT2 was discontinued to allow for starting of Ozempic.  Noted to be moving to [facility] soon. ? ?He saw PCP 12/24/2021 and noted lightheadedness and fatigue post discharge.  He was noted to have significant bruising of scrotum and penis that had not worsened.  Unclear what dose of insulin he was taking and blood  sugars were running low which was thought to be contributory to fatigue.  Labs via PCP 12/24/2021 with hemoglobin 14.1, kidney function indicative of dehydration. ? ?Presents today for follow-up. Shares with me that he spends 60% of his time in [place] and 40% in [facility] - has PCP and GI in [facility] but no other medical providers. Plans to continue cardiology care with our practice. Reviewed echocardiogram as well as LHC in detail. Notes lightheadedness since discharge most often with position changes. Also tells me blood sugars have been "absolutely crazy" as high as 400 and as low as 40. No near syncope nor syncope.  No hypotensive BP readings at home but cannot recall specific measurements. Has not been taking his Lasix. Notes scrotal swelling as well as ecchymosis that is slowly improving but still bothersome. Weight on discharge 234 pounds and today 246 pounds. He does weigh daily at home but does not keep log.  Drinking >2L fluid per day but endorses trying to make low sodium dietary choices. No orthopnea, PND. Does endorse exertional dyspnea which is overall unchanged since discharge.   ? ?EKGs/Labs/Other Studies Reviewed:  ? ?The following studies were reviewed today: ? ?Echo 12/17/21 ? 1. Left ventricular ejection fraction, by estimation, is 20 to 25%. The  ?left ventricle has severely decreased function. The left ventricle  ?demonstrates regional wall motion abnormalities (see scoring  ?diagram/findings for description). The left  ?ventricular internal cavity size was moderately dilated. Left ventricular  ?diastolic parameters are indeterminate.  ? 2. Right ventricular systolic function is moderately reduced. The right  ?ventricular size is normal.  ? 3. Left atrial size was mildly dilated.  ? 4. The mitral valve is grossly normal. Trivial mitral valve  ?regurgitation. No evidence of mitral stenosis.  ? 5. The aortic valve was not well visualized. Aortic valve regurgitation  ?is not visualized. No aortic stenosis is present.   ? 6. Aortic dilatation noted. There is mild dilatation of the ascending  ?aorta, measuring 41 mm.  ? ? LHC  12/17/21 ?  Mid LM to Dist LM lesion is 100% stenosed. ?  Prox LAD lesion is 100% stenosed. ?  Prox Cx lesion is 100% stenosed. ?  Prox RCA to Dist RCA lesion is 100% stenosed. ?  Dist LAD lesion is 60% stenosed. ?  LV end diastolic pressure is normal. ?  ?1.  Patent LIMA to LAD with radial off the LIMA graft to diagonal and obtuse marginal branches. ?2.  Vein graft right coronary artery known occluded and not imaged. ?3.  Severe native vessel disease with known occlusion of right coronary artery which was not imaged on the study and newly occluded left main lesion. ?4.  LVEDP of 16 mmHg. ?  ?The results were reviewed with Dr. Gasper Sells and optimal medical therapy will be pursued. ? ? ?Diagnostic ?Dominance: Right ? ? ? ?EKG:  EKG is  ordered today.  The ekg ordered today demonstrates NSR 71 bpm with iLBBB and no acute ST/T wave changes.  ? ?Recent Labs: ?12/17/2021: TSH 1.259 ?12/24/2021: ALT 16; BUN 44; Creatinine, Ser 2.25; Hemoglobin 14.1; Platelets 184.0; Potassium 4.1; Sodium 130  ?Recent Lipid Panel ?   ?Component Value Date/Time  ? CHOL 345 (H) 12/17/2021 0118  ? TRIG 338 (H) 12/17/2021 0118  ? HDL 46 12/17/2021 0118  ? CHOLHDL 7.5 12/17/2021 0118  ? VLDL 68 (H) 12/17/2021 0118  ? [place] 231 (H) 12/17/2021 0118  ? LDLDIRECT 66.0 03/24/2021 0859  ? ? ?Home Medications  ? ?Current Meds  ?Medication Sig  ? cyclobenzaprine (FLEXERIL) 10 MG tablet Take 10 mg by mouth at bedtime as needed for muscle spasms.  ? finasteride (PROSCAR) 5 MG tablet TAKE 1 TABLET(5 MG) BY MOUTH DAILY (Patient taking differently: Take 5 mg by mouth at bedtime.)  ? gabapentin (NEURONTIN) 100 MG capsule Take 1 cap in morning and 2 cap HS (Patient taking differently: 100 mg daily as needed (for neuropathy).)  ? insulin glargine (LANTUS SOLOSTAR) 100 UNIT/ML Solostar Pen Inject 30 Units into the skin 2 (two) times daily.  ? Insulin  Pen Needle 31G X 6 MM MISC UAD for insulin pen  ? Insulin Syringe-Needle U-100 (BD VEO INSULIN SYRINGE U/F) 31G X 15/64" 1 ML MISC use as directed TO INJECT INSULIN  twice a day  ? nitroGLYCERIN (NITROSTAT)

## 2022-01-03 ENCOUNTER — Encounter (HOSPITAL_BASED_OUTPATIENT_CLINIC_OR_DEPARTMENT_OTHER): Payer: Self-pay

## 2022-01-04 ENCOUNTER — Encounter (HOSPITAL_COMMUNITY): Payer: Self-pay

## 2022-01-04 ENCOUNTER — Telehealth (HOSPITAL_COMMUNITY): Payer: Self-pay

## 2022-01-04 ENCOUNTER — Telehealth (HOSPITAL_BASED_OUTPATIENT_CLINIC_OR_DEPARTMENT_OTHER): Payer: Self-pay

## 2022-01-04 NOTE — Telephone Encounter (Signed)
2nd call attempt, LM  ?

## 2022-01-04 NOTE — Telephone Encounter (Signed)
Rn called patient to follow up on bruising, no answer, LM  ?

## 2022-01-04 NOTE — Telephone Encounter (Signed)
Pt insurance is active and benefits verified through [facility] Medicare. Co-pay $0.00, DED $0.00/$0.00 met, out of pocket $3,600.00/$0.00 met, co-insurance 0%. No pre-authorization required. Passport, 01/04/22 @ 11:12AM, REF#[id number] ?  ?Will contact patient to see if he is interested in the Cardiac Rehab Program. ?

## 2022-01-04 NOTE — Telephone Encounter (Signed)
Attempted to call patient in regards to Cardiac Rehab - LM on VM Mailed letter 

## 2022-01-04 NOTE — Telephone Encounter (Signed)
2nd call attempt, no answer, LM  ?

## 2022-01-05 ENCOUNTER — Telehealth (HOSPITAL_BASED_OUTPATIENT_CLINIC_OR_DEPARTMENT_OTHER): Payer: Self-pay

## 2022-01-05 NOTE — Telephone Encounter (Signed)
3rd call attempt, no answer, lm ?

## 2022-01-12 ENCOUNTER — Encounter: Primary: Emergency Medicine

## 2022-01-17 MED ORDER — RANOLAZINE ER 500 MG PO TB12
500 MG | ORAL_TABLET | ORAL | 0 refills | Status: AC
Start: 2022-01-17 — End: 2022-04-27

## 2022-01-19 ENCOUNTER — Encounter: Attending: Emergency Medicine | Primary: Emergency Medicine

## 2022-01-26 NOTE — Telephone Encounter (Signed)
No response from pt regarding CR.  Closed referral.  

## 2022-01-28 ENCOUNTER — Ambulatory Visit (HOSPITAL_BASED_OUTPATIENT_CLINIC_OR_DEPARTMENT_OTHER): Payer: Medicare Other | Admitting: Cardiology

## 2022-02-18 ENCOUNTER — Other Ambulatory Visit: Payer: Self-pay

## 2022-02-18 ENCOUNTER — Telehealth: Payer: Self-pay | Admitting: Internal Medicine

## 2022-02-18 NOTE — Telephone Encounter (Signed)
Patient needs the following refills: Ozempic ,    finesteride - Please send to [facility], [place] phone #  [phone]  Fax #  [phone]

## 2022-02-22 MED ORDER — OZEMPIC (0.25 OR 0.5 MG/DOSE) 2 MG/1.5ML ~~LOC~~ SOPN: 0.5000 mg | PEN_INJECTOR | SUBCUTANEOUS | 2 refills | Status: DC

## 2022-02-22 NOTE — Telephone Encounter (Signed)
Sent in today 

## 2022-03-16 ENCOUNTER — Other Ambulatory Visit: Payer: Self-pay | Admitting: Nurse Practitioner

## 2022-03-16 DIAGNOSIS — C61 Malignant neoplasm of prostate: Secondary | ICD-10-CM

## 2022-03-21 ENCOUNTER — Ambulatory Visit (HOSPITAL_BASED_OUTPATIENT_CLINIC_OR_DEPARTMENT_OTHER): Payer: Medicare Other | Admitting: Cardiology

## 2022-03-28 ENCOUNTER — Ambulatory Visit
Admission: RE | Admit: 2022-03-28 | Discharge: 2022-03-28 | Disposition: A | Discharge status: Home / Self Care | Payer: Medicare Other | Source: Ambulatory Visit | Attending: Nurse Practitioner | Admitting: Nurse Practitioner

## 2022-03-28 DIAGNOSIS — C61 Malignant neoplasm of prostate: Secondary | ICD-10-CM

## 2022-03-28 DIAGNOSIS — K5730 Diverticulosis of large intestine without perforation or abscess without bleeding: Secondary | ICD-10-CM | POA: Diagnosis not present

## 2022-03-28 MED ORDER — GADOBENATE DIMEGLUMINE 529 MG/ML IV SOLN
20.0000 mL | Freq: Once | INTRAVENOUS | Status: AC | PRN
  Administered 2022-03-28: 20 mL via INTRAVENOUS

## 2022-04-14 ENCOUNTER — Telehealth: Payer: Self-pay | Admitting: *Deleted

## 2022-04-14 DIAGNOSIS — I5042 Chronic combined systolic (congestive) and diastolic (congestive) heart failure: Secondary | ICD-10-CM

## 2022-04-14 DIAGNOSIS — I25118 Atherosclerotic heart disease of native coronary artery with other forms of angina pectoris: Secondary | ICD-10-CM

## 2022-04-14 NOTE — Telephone Encounter (Signed)
   Pre-operative Risk Assessment    Patient Name: Russell Trevino  DOB: 21-May-1951 MRN: [id number]      Request for Surgical Clearance    Procedure:   MRI FUSION PROSTATE Bx  Date of Surgery:  Clearance TBD                                 Surgeon:  DR. Irine Seal Surgeon's Group or Practice Name:  [facility] Phone number:  [phone] Fax number:   [phone]   Type of Clearance Requested:   - Medical  - Pharmacy:  Hold Aspirin and Clopidogrel (Plavix) x 5 DAYS PRIOR TO PROCEDURE    Type of Anesthesia:  Not Indicated   Additional requests/questions:    Russell Trevino   04/14/2022, 5:29 PM

## 2022-04-15 MED ORDER — CLOPIDOGREL BISULFATE 75 MG PO TABS: 75.0000 mg | ORAL_TABLET | Freq: Every day | ORAL | 3 refills | Status: DC

## 2022-04-15 MED ORDER — FUROSEMIDE 40 MG PO TABS: 40.0000 mg | ORAL_TABLET | Freq: Every day | ORAL | 1 refills | Status: DC

## 2022-04-15 NOTE — Telephone Encounter (Signed)
   Name: Russell Trevino  DOB: [birth year]  MRN: [id number]  Primary Cardiologist: Buford Dresser, MD  Chart reviewed as part of pre-operative protocol coverage. Because of Branden Shallenberger Filippini's past medical history and time since last visit, he will require a follow-up in-office visit in order to better assess preoperative cardiovascular risk.  Pre-op covering staff: - Please schedule appointment and call patient to inform them. If patient already had an upcoming appointment within acceptable timeframe, please add "pre-op clearance" to the appointment notes so provider is aware. - Please contact requesting surgeon's office via preferred method (i.e, phone, fax) to inform them of need for appointment prior to surgery.  This message will also be routed to Dr Harrell Gave for input on holding Plavix as requested below so that this information is available to the clearing provider at time of patient's appointment.   Elgie Collard, PA-C  04/15/2022, 10:05 AM

## 2022-04-15 NOTE — Telephone Encounter (Signed)
Plavix held 12/2021 at clinic visit due to scrotal swelling and bruising post recent LHC. He was to resume when bruising resolved. He has been maintained on DAPT ASA/Plavix due to hx of CABG and multivessel disease.  Plavix refill sent to his [facility] pharmacy.   Will address clearance at OV.   Reports no shortness of breath nor dyspnea on exertion. Reports no chest pain, pressure, or tightness. Weights have been decreasing with Lasix. Requests refill sent to [place] as he is presently staying in [place], [place].   Loel Dubonnet, NP

## 2022-04-15 NOTE — Telephone Encounter (Signed)
Pt is agreeable to sooner appt for pre op clearance 04/25/22 @ 1:55 with Laurann Montana, NP. Pt opts to not cancel Dr. Harrell Gave appt at this time.   Pt did also tell me that he needs his Plavix refilled as he has been out of his Plavix about 10 days. I reviewed chart and I informed the pt that I did not see Plavix on his med list, that is seemed to had been removed 12/28/21 when he saw Laurann Montana, NP. I did read the ov notes and looks like the Plavix was being held , but then sounded like it was to be resumed.  I assured the pt let me send a message to NP to confirm if he is supposed to be taking Plavix or not taking Plavix. I assured the pt that Laurann Montana, NP nurse can call him and let him know of plan of care. Pt said if he is supposed to be on Plavix still to please send Rx to [facility] on [address].  Pt thanked me for the help today.

## 2022-04-20 ENCOUNTER — Telehealth: Payer: Self-pay

## 2022-04-20 ENCOUNTER — Telehealth: Payer: Self-pay | Admitting: Internal Medicine

## 2022-04-20 NOTE — Telephone Encounter (Signed)
For our records:  [facility] has faxed PW for surgical clearance, the PW has been put in Dr. Eilleen Kempf boxes.

## 2022-04-20 NOTE — Telephone Encounter (Signed)
Called patient and left voice message regarding scheduled AWV-I for 11:15 am.  Advised to contact [facility] at [phone] or [phone] to reschedule.

## 2022-04-20 NOTE — Progress Notes (Signed)
This encounter was created in error - please disregard. No charge.

## 2022-04-20 NOTE — Telephone Encounter (Signed)
Form received and placed in Dr.Burns folder 

## 2022-04-25 ENCOUNTER — Encounter (HOSPITAL_BASED_OUTPATIENT_CLINIC_OR_DEPARTMENT_OTHER): Payer: Self-pay | Admitting: Family

## 2022-04-25 ENCOUNTER — Ambulatory Visit (HOSPITAL_BASED_OUTPATIENT_CLINIC_OR_DEPARTMENT_OTHER): Payer: Medicare Other | Admitting: Family

## 2022-04-25 VITALS — BP 108/60 | HR 76 | Ht 72.0 in | Wt 247.0 lb

## 2022-04-25 DIAGNOSIS — I10 Essential (primary) hypertension: Secondary | ICD-10-CM | POA: Diagnosis not present

## 2022-04-25 DIAGNOSIS — Z794 Long term (current) use of insulin: Secondary | ICD-10-CM | POA: Diagnosis not present

## 2022-04-25 DIAGNOSIS — I25118 Atherosclerotic heart disease of native coronary artery with other forms of angina pectoris: Secondary | ICD-10-CM

## 2022-04-25 DIAGNOSIS — E785 Hyperlipidemia, unspecified: Secondary | ICD-10-CM

## 2022-04-25 DIAGNOSIS — Z951 Presence of aortocoronary bypass graft: Secondary | ICD-10-CM

## 2022-04-25 DIAGNOSIS — E1159 Type 2 diabetes mellitus with other circulatory complications: Secondary | ICD-10-CM

## 2022-04-25 DIAGNOSIS — I5020 Unspecified systolic (congestive) heart failure: Secondary | ICD-10-CM

## 2022-04-25 DIAGNOSIS — Z01818 Encounter for other preprocedural examination: Secondary | ICD-10-CM

## 2022-04-25 NOTE — Progress Notes (Signed)
Office Visit    Patient Name: Russell Trevino Date of Encounter: 04/25/2022  PCP:  Binnie Rail, MD   [place]  Cardiologist:  Buford Dresser, MD  Advanced Practice Provider:  No care team member to display Electrophysiologist:  None      Chief Complaint    Russell Trevino is a 71 y.o. male with a hx of CAD s/p CABG, ischemic cardiomyopathy, HFrEF, hypertension, hyperlipidemia, DM 2, GERD, CKD 3B presents today for preop clearance.   Past Medical History    Past Medical History:  Diagnosis Date   BPH (benign prostatic hypertrophy)    Cardiomyopathy, ischemic    Coronary artery disease    at [facility], [place] 2009 (GRAFTS PATENT), cath 05/2018 w/ SVG-PDA 100%>>med rx   Diabetes mellitus, type 2 (HCC)    Frequency of urination    GERD (gastroesophageal reflux disease)    Gross hematuria    History of CHF (congestive heart failure) 2007   History of kidney stones    History of myocardial infarction 2006, 2019   2006>>CABG, 2019 cath w/ med rx   History of urinary retention    Hyperlipidemia    Nocturia    Renal calculi    BILATERAL PER CT SCAN--  NON-OBSTRUCTIVE   S/P CABG x 4 2006   Past Surgical History:  Procedure Laterality Date   CARDIAC CATHETERIZATION  01/17/2008   GRAFTS PATENT / EF 40%,  DR SANTOS ([facility])   CORONARY ARTERY BYPASS GRAFT  04/2005   LIMA-LAD, L radial-OM-D1 and SVG-PDA   CYSTO / RIGHT URETERAL STENT PLACEMENT  10-08-1999   CYSTO/  REMOVAL BLADDER STONES  01-14-2010   CYSTOSCOPY  10/16/2012   Procedure: CYSTOSCOPY;  Surgeon: Malka So, MD;  Location: [facility];  Service: Urology;  Laterality: N/A;  Cystoscopy with Clot Evacuation and fulguration bladder neck.   EXTRACORPOREAL SHOCK WAVE LITHOTRIPSY  10-15-2009   RIGHT   KNEE ARTHROSCOPY  1978   RIGHT   LEFT HEART CATH AND CORS/GRAFTS ANGIOGRAPHY N/A 12/17/2021   Procedure: LEFT HEART CATH AND CORS/GRAFTS ANGIOGRAPHY;  Surgeon: [name], MD;  Location: [place] [facility];  Service: Cardiovascular;  Laterality: N/A;   LEFT SHOULDER SURGERY   2002   LUMBAR FUSION  09-10-2008   L4 - L5   PERCUTANEOUS NEPHROSTOLITHOTOMY  1979   PROSTATE BIOPSY  10/16/2012   Procedure: PROSTATE BIOPSY;  Surgeon: Malka So, MD;  Location: [facility];  Service: Urology;  Laterality: N/A;  Through ultrasound.   RIGHT/LEFT HEART CATH AND CORONARY/GRAFT ANGIOGRAPHY N/A 10/26/2018   Procedure: RIGHT/LEFT HEART CATH AND CORONARY/GRAFT ANGIOGRAPHY;  Surgeon: Martinique, Peter M, MD;  Location: [place] [facility];  Service: Cardiovascular;  Laterality: N/A;   SHOULDER ARTHROSCOPY W/ SUBACROMIAL DECOMPRESSION AND DISTAL CLAVICLE EXCISION  10-11-2004   AND PARTIAL ROTATOR CUFF REPAIR   TRANSTHORACIC ECHOCARDIOGRAM  76/72/0947   LV SYSTOLIC FUNCTION MILDLY REDUCED/ EF 48%/ LV FILLING PATTERN  IS IMPAIRED/ HYPOKINESIS IN THE MID AND BASALAR SEPTUM & ANTEROSEPTUM   TURP VAPORIZATION  01-19-2010   BPH W/ BOO   URETEROLITHOTOMY  1976    Allergies  No Known Allergies  History of Present Illness    Russell Trevino is a 71 y.o. male with a hx of CAD s/p CABG, ischemic cardiomyopathy, HFrEF, hypertension, hyperlipidemia, DM 2, GERD, CKD 3B  last seen 12/28/21.  Admitted 12/16/2021 after presenting with chest pain.  It was unclear which  of his medications he was or was not taking.  Echo 12/11/2021 LVEF 25% with septal and inferior wall motion abnormalities worse than previous.  He underwent LHC showing native left main occlusion with patent arterial graft. Medical management recommended. Plan was to potentially add ARNI in outpatient setting.  SGLT2 was discontinued to allow for starting of Ozempic.  Noted to be moving to [facility] soon.  Seen 12/24/21 by PCP with lightheadedness, fatigue, significant bruising of scrotum post procedure. Unclear what dose of insulin he was taking with hypoglycemia thought to be contributory to fatigue.  Followed up with our office 12/28/21 and Plavix held for 5 days.   He presents today for  preoperative clearance. Excited for upcoming trip to see his daughter in [place]. Has been walking 3 times per week for exercise a total of one mile. Some exertional dyspnea which is overall stable compared to previous. Does not think his exercise tolerance is increasing as quickly as he would anticipate. Spends most of his time in [place]. Has not yet established with endocrinology for management of DM2 - notes blood glucose remains labile and attributes his weight gain to insulin usage.  No chest pain, palpitations, edema, orthopnea, PND.  EKGs/Labs/Other Studies Reviewed:   The following studies were reviewed today:  Echo 12/17/21  1. Left ventricular ejection fraction, by estimation, is 20 to 25%. The  left ventricle has severely decreased function. The left ventricle  demonstrates regional wall motion abnormalities (see scoring  diagram/findings for description). The left  ventricular internal cavity size was moderately dilated. Left ventricular  diastolic parameters are indeterminate.   2. Right ventricular systolic function is moderately reduced. The right  ventricular size is normal.   3. Left atrial size was mildly dilated.   4. The mitral valve is grossly normal. Trivial mitral valve  regurgitation. No evidence of mitral stenosis.   5. The aortic valve was not well visualized. Aortic valve regurgitation  is not visualized. No aortic stenosis is present.   6. Aortic dilatation noted. There is mild dilatation of the ascending  aorta, measuring 41 mm.    LHC  12/17/21   Mid LM to Dist LM lesion is 100% stenosed.   Prox LAD lesion is 100% stenosed.   Prox Cx lesion is 100% stenosed.   Prox RCA to Dist RCA lesion is 100% stenosed.   Dist LAD lesion is 60% stenosed.   LV end diastolic pressure is normal.   1.  Patent LIMA to LAD with radial off the LIMA graft to diagonal and obtuse marginal  branches. 2.  Vein graft right coronary artery known occluded and not imaged. 3.  Severe native vessel disease with known occlusion of right coronary artery which was not imaged on the study and newly occluded left main lesion. 4.  LVEDP of 16 mmHg.   The results were reviewed with Dr. Gasper Sells and optimal medical therapy will be pursued.   Diagnostic Dominance: Right    EKG:  EKG is  ordered today.  The ekg ordered today demonstrates NSR 76  bpm with iLBBB and no acute ST/T wave changes.   Recent Labs: 12/17/2021: TSH 1.259 12/24/2021: ALT 16; BUN 44; Creatinine, Ser 2.25; Hemoglobin 14.1; Platelets 184.0; Potassium 4.1; Sodium 130  Recent Lipid Panel    Component Value Date/Time   CHOL 345 (H) 12/17/2021 0118   TRIG 338 (H) 12/17/2021 0118   HDL 46 12/17/2021 0118   CHOLHDL 7.5 12/17/2021 0118   VLDL 68 (H) 12/17/2021 0118  LDLCALC 231 (H) 12/17/2021 0118   LDLDIRECT 66.0 03/24/2021 0859    Home Medications   Current Meds  Medication Sig   aspirin 81 MG EC tablet Take 1 tablet (81 mg total) by mouth in the morning.   clopidogrel (PLAVIX) 75 MG tablet Take 1 tablet (75 mg total) by mouth daily.   cyclobenzaprine (FLEXERIL) 10 MG tablet Take 10 mg by mouth at bedtime as needed for muscle spasms.   ezetimibe (ZETIA) 10 MG tablet Take 1 tablet (10 mg total) by mouth every evening.   finasteride (PROSCAR) 5 MG tablet TAKE 1 TABLET(5 MG) BY MOUTH DAILY (Patient taking differently: Take 5 mg by mouth at bedtime.)   furosemide (LASIX) 40 MG tablet Take 1 tablet (40 mg total) by mouth daily.   gabapentin (NEURONTIN) 100 MG capsule Take 1 cap in morning and 2 cap HS (Patient taking differently: 100 mg daily as needed (for neuropathy).)   insulin glargine (LANTUS SOLOSTAR) 100 UNIT/ML Solostar Pen Inject 30 Units into the skin 2 (two) times daily.   Insulin Pen Needle 31G X 6 MM MISC UAD for insulin pen   Insulin Syringe-Needle U-100 (BD VEO INSULIN SYRINGE U/F) 31G X 15/64" 1 ML  MISC use as directed TO INJECT INSULIN  twice a day   isosorbide mononitrate (IMDUR) 120 MG 24 hr tablet Take 1 tablet (120 mg total) by mouth 2 (two) times daily.   metoprolol succinate (TOPROL-XL) 50 MG 24 hr tablet Take 1 tablet (50 mg total) by mouth daily at 12 noon. Take with or immediately following a meal.   nitroGLYCERIN (NITROSTAT) 0.4 MG SL tablet DISSOLVE 1 TABLET UNDER THE TONGUE EVERY 5 MINUTES AS NEEDED FOR CHEST PAIN. CALL MD/ 911 IF TAKE 2 DOSES. MAX 3 DOSES PER DAY   ranolazine (RANEXA) 500 MG 12 hr tablet Take 1 tablet (500 mg total) by mouth 2 (two) times daily.   rosuvastatin (CRESTOR) 40 MG tablet Take 1 tablet (40 mg total) by mouth daily.   sacubitril-valsartan (ENTRESTO) 49-51 MG Take 1 tablet by mouth 2 (two) times daily.   Semaglutide,0.25 or 0.'5MG'$ /DOS, (OZEMPIC, 0.25 OR 0.5 MG/DOSE,) 2 MG/1.5ML SOPN Inject 0.5 mg into the skin every Wednesday.     Review of Systems      All other systems reviewed and are otherwise negative except as noted above.  Physical Exam    VS:  BP 108/60   Pulse 76   Ht 6' (1.829 m)   Wt 247 lb (112 kg)   BMI 33.50 kg/m  , BMI Body mass index is 33.5 kg/m.  Wt Readings from Last 3 Encounters:  04/25/22 247 lb (112 kg)  12/28/21 246 lb (111.6 kg)  12/24/21 240 lb (108.9 kg)    GEN: Well nourished, overweight, well developed, in no acute distress. HEENT: normal. Neck: Supple, no JVD, carotid bruits, or masses. Cardiac: RRR, no murmurs, rubs, or gallops. No clubbing, cyanosis. Trace bilateral LE edema.  Radials/PT 2+ and equal bilaterally.  Respiratory:  Respirations regular and unlabored, clear to auscultation bilaterally. GI: Soft, nontender, nondistended. MS: No deformity or atrophy. Skin: Warm and dry, no rash. Neuro:  Strength and sensation are intact. Psych: Normal affect.  Assessment & Plan    Preop -preop clearance for prostate biopsy with urology. According to the Revised Cardiac Risk Index (RCRI), his Perioperative  Risk of Major Cardiac Event is (%): 11. His Functional Capacity in METs is: 5.62 according to the Duke Activity Status Index (DASI).  He is deemed  acceptable risk for the planned procedure.  May hold aspirin/Plavix at the discretion of urology per Dr. Harrell Gave.  Please resume as soon as possible postoperatively.  Will route to urology team via epic fax function.   CAD / HFrEF - s/p CABG 2020. 12/2021 LVEF 20-25%. LHC 12/2021 patent LIMA-LAD, SVG-RCA known to be occluded. Newly occluded LM in native system with appropriate grafts. Recommended for medical management. Low sodium diet, fluid restriction <2L, and daily weights encouraged. Educated to contact our office for weight gain of 2 lbs overnight or 5 lbs in one week.  GDMT CAD: Ranexa, Imdur, Aspirin, Plavix, Rosuvastatin, Zetia. Per Dr. Harrell Gave may hold Plavix, Aspirin preoperatively. GDMT HF: Entresto 49-'51mg'$  BID, Toprol '50mg'$  QD.  Change Lasix from '40mg'$  PRN to daily. BMP, BNP in 1 week. Future considerations include MRA, Jardiance but presently precluded by renal function. Of note, tolerated Jardiance in the past but PCP in [place] stopped it when Ozempic was initiated. Update BMP, BNP.   HTN - BP well controlled. Continue current antihypertensive regimen.    CKD - Careful titration of diuretic and antihypertensive. BMP today. Consider referral to nephrology pending results.   DM2 - 12/17/2021 A1c 11.2. Continue to follow with PCP. Difficult management as PCP in  started Ozempic but also taking unclear doses of insulin with very labile CBGs. Has been referred to endocrinology 12/2021 for further management by Dr. Quay Burow but referral was cancelled as [facility] could not accept new referral until November. Will reach out to Dr. Quay Burow in case she wants to refer to new provider.   HLD, LDL goal <70 - Continue Rosuvastatin '40mg'$  QD, Zetia '10mg'$  QD. 12/17/21 LDL 231 - suspected medication noncompliance at the time. Repeat lipid panel at follow up. Not collected  today as not fasting.   Disposition: Follow up as scheduled with Buford Dresser, MD  Signed, Loel Dubonnet, NP 04/25/2022, 1:49 PM [place]

## 2022-04-25 NOTE — Patient Instructions (Addendum)
Medication Instructions:  Continue your current medications.   You may hold Clopidogrel (Plavix) and Aspirin as directed by urology prior to procedure.   *If you need a refill on your cardiac medications before your next appointment, please call your pharmacy*   Lab Work: Your physician recommends that you return for lab work today: BMP, BNP  If you have labs (blood work) drawn today and your tests are completely normal, you will receive your results only by: MyChart Message (if you have MyChart) OR A paper copy in the mail If you have any lab test that is abnormal or we need to change your treatment, we will call you to review the results.   Testing/Procedures: Your EKG today was stable compared to previous.   Follow-Up: At [facility], you and your health needs are our priority.  As part of our continuing mission to provide you with exceptional heart care, we have created designated Provider Care Teams.  These Care Teams include your primary Cardiologist (physician) and Advanced Practice Providers (APPs -  Physician Assistants and Nurse Practitioners) who all work together to provide you with the care you need, when you need it.  We recommend signing up for the patient portal called "MyChart".  Sign up information is provided on this After Visit Summary.  MyChart is used to connect with patients for Virtual Visits (Telemedicine).  Patients are able to view lab/test results, encounter notes, upcoming appointments, etc.  Non-urgent messages can be sent to your provider as well.   To learn more about what you can do with MyChart, go to [email or web address].    Your next appointment:   As scheduled with Dr. Harrell Gave   Other Instructions  Loel Dubonnet, NP will send a note to Dr. Jeffie Pollock that you are cleared for your procedure.   Heart Healthy Diet Recommendations: A low-salt diet is recommended. Meats should be grilled, baked, or boiled. Avoid fried foods. Focus on lean  protein sources like fish or chicken with vegetables and fruits. The American Heart Association is a Microbiologist!  American Heart Association Diet and Lifeystyle Recommendations .c  Exercise recommendations: The American Heart Association recommends 150 minutes of moderate intensity exercise weekly. Try 30 minutes of moderate intensity exercise 4-5 times per week. This could include walking, jogging, or swimming.   Important Information About Sugar

## 2022-04-25 NOTE — Progress Notes (Signed)
Formatting of this note is different from the original.  Images from the original note were not included.      Office Visit     Patient Name: Alfred Weiss  Date of Encounter: 04/25/2022    PCP:  Binnie Rail, MD    [place]   Cardiologist:  Buford Dresser, MD   Advanced Practice Provider:  No care team member to display  Electrophysiologist:  None       Chief Complaint     Alfred Weiss is a 71 y.o. male with a hx of CAD s/p CABG, ischemic cardiomyopathy, HFrEF, hypertension, hyperlipidemia, DM 2, GERD, CKD 3B presents today for preop clearance.     Past Medical History     Past Medical History:   Diagnosis Date    BPH (benign prostatic hypertrophy)     Cardiomyopathy, ischemic     Coronary artery disease     at [facility] [place] 2009 (GRAFTS PATENT), cath 05/2018 w/ SVG-PDA 100%>>med rx    Diabetes mellitus, type 2 (HCC)     Frequency of urination     GERD (gastroesophageal reflux disease)     Gross hematuria     History of CHF (congestive heart failure) 2007    History of kidney stones     History of myocardial infarction 2006, 2019    2006>>CABG, 2019 cath w/ med rx    History of urinary retention     Hyperlipidemia     Nocturia     Renal calculi     BILATERAL PER CT SCAN--  NON-OBSTRUCTIVE    S/P CABG x 4 2006     Past Surgical History:   Procedure Laterality Date    CARDIAC CATHETERIZATION  01/17/2008    GRAFTS PATENT / EF 40%,  DR SANTOS ([facility])    CORONARY ARTERY BYPASS GRAFT  04/2005    LIMA-LAD, L radial-OM-D1 and SVG-PDA    CYSTO / RIGHT URETERAL STENT PLACEMENT  10-08-1999    CYSTO/  REMOVAL BLADDER STONES  01-14-2010    CYSTOSCOPY  10/16/2012    Procedure: CYSTOSCOPY;  Surgeon: Malka So, MD;  Location: [facility];  Service: Urology;  Laterality: N/A;  Cystoscopy with Clot Evacuation and fulguration bladder neck.    EXTRACORPOREAL SHOCK WAVE LITHOTRIPSY  10-15-2009    RIGHT    KNEE ARTHROSCOPY  1978    RIGHT    LEFT HEART CATH AND CORS/GRAFTS  ANGIOGRAPHY N/A 12/17/2021    Procedure: LEFT HEART CATH AND CORS/GRAFTS ANGIOGRAPHY;  Surgeon: [name], MD;  Location: [place] [facility];  Service: Cardiovascular;  Laterality: N/A;    LEFT SHOULDER SURGERY   2002    LUMBAR FUSION  09-10-2008    L4 - L5    PERCUTANEOUS NEPHROSTOLITHOTOMY  1979    PROSTATE BIOPSY  10/16/2012    Procedure: PROSTATE BIOPSY;  Surgeon: Malka So, MD;  Location: [facility];  Service: Urology;  Laterality: N/A;  Through ultrasound.    RIGHT/LEFT HEART CATH AND CORONARY/GRAFT ANGIOGRAPHY N/A 10/26/2018    Procedure: RIGHT/LEFT HEART CATH AND CORONARY/GRAFT ANGIOGRAPHY;  Surgeon: Martinique, Peter M, MD;  Location: [place] [facility];  Service: Cardiovascular;  Laterality: N/A;    SHOULDER ARTHROSCOPY W/ SUBACROMIAL DECOMPRESSION AND DISTAL CLAVICLE EXCISION  10-11-2004    AND PARTIAL ROTATOR CUFF REPAIR    TRANSTHORACIC ECHOCARDIOGRAM  41/32/4401    LV SYSTOLIC FUNCTION MILDLY REDUCED/ EF 48%/ LV FILLING PATTERN  IS IMPAIRED/  HYPOKINESIS IN THE MID AND BASALAR SEPTUM & ANTEROSEPTUM    TURP VAPORIZATION  01-19-2010    BPH W/ BOO    URETEROLITHOTOMY  1976     Allergies    No Known Allergies    History of Present Illness     ZOLTON Weiss is a 71 y.o. male with a hx of CAD s/p CABG, ischemic cardiomyopathy, HFrEF, hypertension, hyperlipidemia, DM 2, GERD, CKD 3B  last seen 12/28/21.    Admitted 12/16/2021 after presenting with chest pain.  It was unclear which of his medications he was or was not taking.  Echo 12/11/2021 LVEF 25% with septal and inferior wall motion abnormalities worse than previous.  He underwent LHC showing native left main occlusion with patent arterial graft. Medical management recommended. Plan was to potentially add ARNI in outpatient setting.  SGLT2 was discontinued to allow for starting of Ozempic.  Noted to be moving to [facility] soon.    Seen 12/24/21 by PCP with lightheadedness, fatigue, significant bruising of scrotum post  procedure. Unclear what dose of insulin he was taking with hypoglycemia thought to be contributory to fatigue. Followed up with our office 12/28/21 and Plavix held for 5 days.     He presents today for  preoperative clearance. Excited for upcoming trip to see his daughter in [place]. Has been walking 3 times per week for exercise a total of one mile. Some exertional dyspnea which is overall stable compared to previous. Does not think his exercise tolerance is increasing as quickly as he would anticipate. Spends most of his time in [place]. Has not yet established with endocrinology for management of DM2 - notes blood glucose remains labile and attributes his weight gain to insulin usage.  No chest pain, palpitations, edema, orthopnea, PND.    EKGs/Labs/Other Studies Reviewed:     The following studies were reviewed today:    Echo 12/17/21   1. Left ventricular ejection fraction, by estimation, is 20 to 25%. The   left ventricle has severely decreased function. The left ventricle   demonstrates regional wall motion abnormalities (see scoring   diagram/findings for description). The left   ventricular internal cavity size was moderately dilated. Left ventricular   diastolic parameters are indeterminate.    2. Right ventricular systolic function is moderately reduced. The right   ventricular size is normal.    3. Left atrial size was mildly dilated.    4. The mitral valve is grossly normal. Trivial mitral valve   regurgitation. No evidence of mitral stenosis.    5. The aortic valve was not well visualized. Aortic valve regurgitation   is not visualized. No aortic stenosis is present.    6. Aortic dilatation noted. There is mild dilatation of the ascending   aorta, measuring 41 mm.      LHC  12/17/21    Mid LM to Dist LM lesion is 100% stenosed.    Prox LAD lesion is 100% stenosed.    Prox Cx lesion is 100% stenosed.    Prox RCA to Dist RCA lesion is 100% stenosed.    Dist LAD lesion is 60% stenosed.    LV end diastolic pressure  is normal.    1.  Patent LIMA to LAD with radial off the LIMA graft to diagonal and obtuse marginal branches.  2.  Vein graft right coronary artery known occluded and not imaged.  3.  Severe native vessel disease with known occlusion of right coronary artery which was not imaged  on the study and newly occluded left main lesion.  4.  LVEDP of 16 mmHg.    The results were reviewed with Dr. Gasper Sells and optimal medical therapy will be pursued.    Diagnostic  Dominance: Right    EKG:  EKG is  ordered today.  The ekg ordered today demonstrates NSR 76  bpm with iLBBB and no acute ST/T wave changes.     Recent Labs:  12/17/2021: TSH 1.259  12/24/2021: ALT 16; BUN 44; Creatinine, Ser 2.25; Hemoglobin 14.1; Platelets 184.0; Potassium 4.1; Sodium 130   Recent Lipid Panel    Component Value Date/Time    CHOL 345 (H) 12/17/2021 0118    TRIG 338 (H) 12/17/2021 0118    HDL 46 12/17/2021 0118    CHOLHDL 7.5 12/17/2021 0118    VLDL 68 (H) 12/17/2021 0118    LDLCALC 231 (H) 12/17/2021 0118    LDLDIRECT 66.0 03/24/2021 0859     Home Medications     Current Meds   Medication Sig    aspirin 81 MG EC tablet Take 1 tablet (81 mg total) by mouth in the morning.    clopidogrel (PLAVIX) 75 MG tablet Take 1 tablet (75 mg total) by mouth daily.    cyclobenzaprine (FLEXERIL) 10 MG tablet Take 10 mg by mouth at bedtime as needed for muscle spasms.    ezetimibe (ZETIA) 10 MG tablet Take 1 tablet (10 mg total) by mouth every evening.    finasteride (PROSCAR) 5 MG tablet TAKE 1 TABLET(5 MG) BY MOUTH DAILY (Patient taking differently: Take 5 mg by mouth at bedtime.)    furosemide (LASIX) 40 MG tablet Take 1 tablet (40 mg total) by mouth daily.    gabapentin (NEURONTIN) 100 MG capsule Take 1 cap in morning and 2 cap HS (Patient taking differently: 100 mg daily as needed (for neuropathy).)    insulin glargine (LANTUS SOLOSTAR) 100 UNIT/ML Solostar Pen Inject 30 Units into the skin 2 (two) times daily.    Insulin Pen Needle 31G X 6 MM MISC UAD for  insulin pen    Insulin Syringe-Needle U-100 (BD VEO INSULIN SYRINGE U/F) 31G X 15/64" 1 ML MISC use as directed TO INJECT INSULIN  twice a day    isosorbide mononitrate (IMDUR) 120 MG 24 hr tablet Take 1 tablet (120 mg total) by mouth 2 (two) times daily.    metoprolol succinate (TOPROL-XL) 50 MG 24 hr tablet Take 1 tablet (50 mg total) by mouth daily at 12 noon. Take with or immediately following a meal.    nitroGLYCERIN (NITROSTAT) 0.4 MG SL tablet DISSOLVE 1 TABLET UNDER THE TONGUE EVERY 5 MINUTES AS NEEDED FOR CHEST PAIN. CALL MD/ 911 IF TAKE 2 DOSES. MAX 3 DOSES PER DAY    ranolazine (RANEXA) 500 MG 12 hr tablet Take 1 tablet (500 mg total) by mouth 2 (two) times daily.    rosuvastatin (CRESTOR) 40 MG tablet Take 1 tablet (40 mg total) by mouth daily.    sacubitril-valsartan (ENTRESTO) 49-51 MG Take 1 tablet by mouth 2 (two) times daily.    Semaglutide,0.25 or 0.'5MG'$ /DOS, (OZEMPIC, 0.25 OR 0.5 MG/DOSE,) 2 MG/1.5ML SOPN Inject 0.5 mg into the skin every Wednesday.       Review of Systems     All other systems reviewed and are otherwise negative except as noted above.    Physical Exam     VS:  BP 108/60   Pulse 76   Ht 6' (1.829 m)   Wt 247 lb (  112 kg)   BMI 33.50 kg/m  , BMI Body mass index is 33.5 kg/m.    Wt Readings from Last 3 Encounters:   04/25/22 247 lb (112 kg)   12/28/21 246 lb (111.6 kg)   12/24/21 240 lb (108.9 kg)     GEN: Well nourished, overweight, well developed, in no acute distress.  HEENT: normal.  Neck: Supple, no JVD, carotid bruits, or masses.  Cardiac: RRR, no murmurs, rubs, or gallops. No clubbing, cyanosis. Trace bilateral LE edema.  Radials/PT 2+ and equal bilaterally.   Respiratory:  Respirations regular and unlabored, clear to auscultation bilaterally.  GI: Soft, nontender, nondistended.  MS: No deformity or atrophy.  Skin: Warm and dry, no rash.  Neuro:  Strength and sensation are intact.  Psych: Normal affect.    Assessment & Plan     Preop -preop clearance for prostate biopsy  with urology. According to the Revised Cardiac Risk Index (RCRI), his Perioperative Risk of Major Cardiac Event is (%): 11. His Functional Capacity in METs is: 5.62 according to the Duke Activity Status Index (DASI).  He is deemed acceptable risk for the planned procedure.  May hold aspirin/Plavix at the discretion of urology per Dr. Harrell Gave.  Please resume as soon as possible postoperatively.  Will route to urology team via epic fax function.    CAD / HFrEF - s/p CABG 2020. 12/2021 LVEF 20-25%. LHC 12/2021 patent LIMA-LAD, SVG-RCA known to be occluded. Newly occluded LM in native system with appropriate grafts. Recommended for medical management. Low sodium diet, fluid restriction <2L, and daily weights encouraged. Educated to contact our office for weight gain of 2 lbs overnight or 5 lbs in one week.   GDMT CAD: Ranexa, Imdur, Aspirin, Plavix, Rosuvastatin, Zetia.  Per Dr. Harrell Gave may hold Plavix, Aspirin preoperatively.  GDMT HF: Entresto 49-'51mg'$  BID, Toprol '50mg'$  QD.  Change Lasix from '40mg'$  PRN to daily. BMP, BNP in 1 week. Future considerations include MRA, Jardiance but presently precluded by renal function. Of note, tolerated Jardiance in the past but PCP in [place] stopped it when Ozempic was initiated. Update BMP, BNP.     HTN - BP well controlled. Continue current antihypertensive regimen.      CKD - Careful titration of diuretic and antihypertensive. BMP today. Consider referral to nephrology pending results.     DM2 - 12/17/2021 A1c 11.2. Continue to follow with PCP. Difficult management as PCP in [place] started Ozempic but also taking unclear doses of insulin with very labile CBGs. Has been referred to endocrinology 12/2021 for further management by Dr. Quay Burow but referral was cancelled as [facility] could not accept new referral until November. Will reach out to Dr. Quay Burow in case she wants to refer to new provider.     HLD, LDL goal <70 - Continue Rosuvastatin '40mg'$  QD, Zetia '10mg'$  QD. 12/17/21 LDL 231 - suspected  medication noncompliance at the time. Repeat lipid panel at follow up. Not collected today as not fasting.     Disposition: Follow up as scheduled with Buford Dresser, MD    Signed,  Loel Dubonnet, NP  04/25/2022, 1:49 PM  [facility]  Electronically signed by Loel Dubonnet, NP at 04/25/2022  7:24 PM EDT

## 2022-04-26 LAB — BRAIN NATRIURETIC PEPTIDE: BNP: 7.3 pg/mL (ref 0.0–100.0)

## 2022-04-26 LAB — BASIC METABOLIC PANEL
BUN/Creatinine Ratio: 20 (ref 10–24)
BUN: 43 mg/dL — ABNORMAL HIGH (ref 8–27)
CO2: 21 mmol/L (ref 20–29)
Calcium: 9.4 mg/dL (ref 8.6–10.2)
Chloride: 99 mmol/L (ref 96–106)
Creatinine, Ser: 2.2 mg/dL — ABNORMAL HIGH (ref 0.76–1.27)
Glucose: 423 mg/dL — ABNORMAL HIGH (ref 70–99)
Potassium: 5.6 mmol/L — ABNORMAL HIGH (ref 3.5–5.2)
Sodium: 135 mmol/L (ref 134–144)
eGFR: 31 mL/min/{1.73_m2} — ABNORMAL LOW (ref >59)

## 2022-04-26 MED ORDER — AMLODIPINE BESYLATE 5 MG PO TABS
5 MG | ORAL_TABLET | ORAL | 0 refills | Status: AC
Start: 2022-04-26 — End: 2022-04-27

## 2022-04-27 ENCOUNTER — Telehealth (HOSPITAL_BASED_OUTPATIENT_CLINIC_OR_DEPARTMENT_OTHER): Payer: Self-pay

## 2022-04-27 ENCOUNTER — Ambulatory Visit
Admit: 2022-04-27 | Discharge: 2022-04-27 | Payer: MEDICARE | Attending: Emergency Medicine | Primary: Emergency Medicine

## 2022-04-27 DIAGNOSIS — I25119 Atherosclerotic heart disease of native coronary artery with unspecified angina pectoris: Secondary | ICD-10-CM

## 2022-04-27 DIAGNOSIS — T182XXD Foreign body in stomach, subsequent encounter: Secondary | ICD-10-CM | POA: Diagnosis not present

## 2022-04-27 DIAGNOSIS — I129 Hypertensive chronic kidney disease with stage 1 through stage 4 chronic kidney disease, or unspecified chronic kidney disease: Secondary | ICD-10-CM | POA: Diagnosis not present

## 2022-04-27 DIAGNOSIS — I10 Essential (primary) hypertension: Secondary | ICD-10-CM

## 2022-04-27 DIAGNOSIS — I255 Ischemic cardiomyopathy: Secondary | ICD-10-CM | POA: Diagnosis not present

## 2022-04-27 DIAGNOSIS — K219 Gastro-esophageal reflux disease without esophagitis: Secondary | ICD-10-CM | POA: Diagnosis not present

## 2022-04-27 DIAGNOSIS — R197 Diarrhea, unspecified: Secondary | ICD-10-CM | POA: Diagnosis not present

## 2022-04-27 DIAGNOSIS — E7800 Pure hypercholesterolemia, unspecified: Secondary | ICD-10-CM | POA: Diagnosis not present

## 2022-04-27 DIAGNOSIS — Z794 Long term (current) use of insulin: Secondary | ICD-10-CM | POA: Diagnosis not present

## 2022-04-27 DIAGNOSIS — E1122 Type 2 diabetes mellitus with diabetic chronic kidney disease: Secondary | ICD-10-CM | POA: Diagnosis not present

## 2022-04-27 DIAGNOSIS — E119 Type 2 diabetes mellitus without complications: Secondary | ICD-10-CM | POA: Diagnosis not present

## 2022-04-27 DIAGNOSIS — Z91199 Patient's noncompliance with other medical treatment and regimen due to unspecified reason: Secondary | ICD-10-CM | POA: Diagnosis not present

## 2022-04-27 DIAGNOSIS — N1832 Chronic kidney disease, stage 3b: Secondary | ICD-10-CM | POA: Diagnosis not present

## 2022-04-27 MED ORDER — EZETIMIBE 10 MG PO TABS
10 MG | ORAL_TABLET | ORAL | 0 refills | Status: AC
Start: 2022-04-27 — End: ?

## 2022-04-27 MED ORDER — ESOMEPRAZOLE MAGNESIUM 40 MG PO CPDR
40 MG | ORAL_CAPSULE | Freq: Every day | ORAL | 0 refills | Status: AC
Start: 2022-04-27 — End: 2022-07-26

## 2022-04-27 MED ORDER — RANOLAZINE ER 500 MG PO TB12
500 MG | ORAL_TABLET | Freq: Two times a day (BID) | ORAL | 0 refills | Status: AC
Start: 2022-04-27 — End: ?

## 2022-04-27 MED ORDER — OZEMPIC (0.25 OR 0.5 MG/DOSE) 2 MG/3ML SC SOPN
2 MG/3ML | SUBCUTANEOUS | 0 refills | Status: AC
Start: 2022-04-27 — End: ?

## 2022-04-27 MED ORDER — METOPROLOL SUCCINATE ER 50 MG PO TB24
50 MG | ORAL_TABLET | Freq: Every day | ORAL | 0 refills | Status: AC
Start: 2022-04-27 — End: ?

## 2022-04-27 MED ORDER — FUROSEMIDE 40 MG PO TABS
40 MG | ORAL_TABLET | Freq: Every day | ORAL | 0 refills | Status: AC
Start: 2022-04-27 — End: ?

## 2022-04-27 MED ORDER — ENTRESTO 49-51 MG PO TABS
49-51 MG | ORAL_TABLET | ORAL | 0 refills | Status: AC
Start: 2022-04-27 — End: ?

## 2022-04-27 MED ORDER — AMLODIPINE BESYLATE 5 MG PO TABS
5 MG | ORAL_TABLET | Freq: Every day | ORAL | 0 refills | Status: AC
Start: 2022-04-27 — End: 2023-06-01

## 2022-04-27 MED ORDER — ROSUVASTATIN CALCIUM 40 MG PO TABS
40 MG | ORAL_TABLET | ORAL | 0 refills | Status: AC
Start: 2022-04-27 — End: ?

## 2022-04-27 MED ORDER — FAMOTIDINE 40 MG PO TABS
40 MG | ORAL_TABLET | ORAL | 0 refills | Status: AC
Start: 2022-04-27 — End: ?

## 2022-04-27 MED ORDER — ISOSORBIDE MONONITRATE ER 120 MG PO TB24
120 MG | ORAL_TABLET | Freq: Two times a day (BID) | ORAL | 0 refills | Status: AC
Start: 2022-04-27 — End: ?

## 2022-04-27 MED ORDER — FINASTERIDE 5 MG PO TABS
5 MG | ORAL_TABLET | ORAL | 0 refills | Status: AC
Start: 2022-04-27 — End: ?

## 2022-04-27 MED ORDER — LANTUS SOLOSTAR 100 UNIT/ML SC SOPN
100 UNIT/ML | SUBCUTANEOUS | 0 refills | Status: AC
Start: 2022-04-27 — End: ?

## 2022-04-27 MED ORDER — CLOPIDOGREL BISULFATE 75 MG PO TABS
75 MG | ORAL_TABLET | ORAL | 0 refills | Status: AC
Start: 2022-04-27 — End: ?

## 2022-04-27 NOTE — Telephone Encounter (Addendum)
Results called to patient who verbalizes understanding!  Orders placed for nephrology. Patient will not be back in town until Thursday of next week but he will be landing in [place], [place].      ----- Message from Loel Dubonnet, NP sent at 04/27/2022  9:22 AM EDT ----- Kidney function remains decreased- recommend referral to nephrology.  Potassium mildly elevated - avoid potassium rich foods (avocado, banana, cantaloupe). Ensure not taking potassium supplement. Hold Entresto for two days, then resume at same 49-'51mg'$  BID dosing. Repeat BMP Friday or Monday for monitoring.  BNP with no significant volume overload.   Note to self - unable to add SGLT2, MRA due to renal function.

## 2022-04-27 NOTE — Telephone Encounter (Signed)
I have reviewed the ov note from Laurann [place], NP;pt has been cleared. I will re-fax the ov notes to requesting office today.

## 2022-04-27 NOTE — Telephone Encounter (Signed)
[facility] is calling to ask for update on this clearance. Requesting a fax with details.

## 2022-04-27 NOTE — Telephone Encounter (Signed)
May have labs through [place] at [facility]. Please reorder with 'quest' as resulting agency.  LOCATION INFORMATION [place],  [address] Phone   [phone] Fax   [phone]  He will need to call to ensure he does not need an appointment.   Loel Dubonnet, NP

## 2022-04-27 NOTE — Addendum Note (Signed)
Addended by: Gerald Stabs on: 04/27/2022 01:20 PM   Modules accepted: Orders

## 2022-04-27 NOTE — Telephone Encounter (Signed)
Call attempt, no answer, the following message sent via mychart.       "May have labs through [place] at [facility]. Please reorder with 'quest' as resulting agency.   LOCATION INFORMATION [place], [place] [address] Phone   [phone] Fax   [phone]   He will need to call to ensure he does not need an appointment.    Loel Dubonnet, NP "

## 2022-04-27 NOTE — Progress Notes (Signed)
CHIEF COMPLAINT:  Chief Complaint   Patient presents with    Medicare AWV     F/U Medical Conditions       HPI:   Symptom(s):   Alfred Weiss is a pleasant  71 y.o. year-old patient with  history of: Cardiomyopathy, CAD, poorly controlled diabetes/morbid obesity, renal failure, hypertension, mixed hyperlipidemia, prostate cancer who wishes to become reestablished/lost to follow-up due to medical noncompliance for the last year).  Unfortunately his girlfriend lives in [place] and has been battling stage IV lung cancer.  He spent 3 months in the [place] and a lot of time out in [place].  His cardiology specialist is in [place].  He has not been seeing a PCP, endocrinologist, or nephrologist but is scheduled for a repeat prostate biopsy in [place].  No records available/no recent labs and medications have been changed dramatically since his last visit.  Four-vessel CABG 2006 last LHC on record 10/2018 multivessel disease treated medically.  Remains on Entresto, Ranexa and isosorbide with other antihypertensive meds.  No chest pain, SOB or syncope.  No records ever obtained from cardiologist.  He states insurance denied the [facility] system.  His insulin medications have been changed.  He is not on Jardiance but is on Ozempic.  I recommend all his specialists/PCP in 1 location.Overdue tests/specialists: Labs, endocrine, nephrology  Medication list updated/90-day supply of all current meds sent.  Additional chronic medical conditions managed include: GERD/gastric bezoar 2021-Dr. Roanna Raider lost to follow-up, OA/right sciatica.  Normal follow-up every 3 months with labs.    Specialist(s):  Cardiologist [facility] [place] with borderline renal function  Urologist [place] planning repeat biopsy (positive biopsy 2014 opted for active surveillance/TURP 2011)  Neurologist-previous intention tremor felt to be related to Metoclopramide medicine discontinued tremor persists  Dr. Roanna Raider  colonoscopy not accomplished, EGD 12/16/2019 showed irregular Z-line large gastric bezoar-lost to follow-up  Current Outpatient Medications   Medication Sig Dispense Refill    amLODIPine (NORVASC) 5 MG tablet Take 1 tablet by mouth daily 100 tablet 0    ezetimibe (ZETIA) 10 MG tablet 1 tablet Orally Once a day 100 tablet 0    OZEMPIC, 0.25 OR 0.5 MG/DOSE, 2 MG/3ML SOPN 0.5 mg/week subcu 9 mL 0    rosuvastatin (CRESTOR) 40 MG tablet 1 tablet Orally Once a day 100 tablet 0    sacubitril-valsartan (ENTRESTO) 49-51 MG per tablet 1 tablet Orally Twice a day 200 tablet 0    famotidine (PEPCID) 40 MG tablet 1 tablet at bedtime Orally Once a day for 90 days 90 tablet 0    esomeprazole (NEXIUM) 40 MG delayed release capsule Take 1 capsule by mouth every morning (before breakfast) 90 capsule 0    clopidogrel (PLAVIX) 75 MG tablet 1 tablet Orally Once per day for 90 days 100 tablet 0    finasteride (PROSCAR) 5 MG tablet 1 tablet Orally Once per day at 12 noon for 90 days 100 tablet 0    furosemide (LASIX) 40 MG tablet Take 1 tablet by mouth daily 90 tablet 0    isosorbide mononitrate (IMDUR) 120 MG extended release tablet Take 1 tablet by mouth 2 times daily 200 tablet 0    LANTUS SOLOSTAR 100 UNIT/ML injection pen ADMINISTER 30 UNITS UNDER THE SKIN TWICE DAILY 5 Adjustable Dose Pre-filled Pen Syringe 0    metoprolol succinate (TOPROL XL) 50 MG extended release tablet Take 1 tablet by mouth daily At noon 100 tablet 0    ranolazine (RANEXA) 500  MG extended release tablet Take 1 tablet by mouth 2 times daily 200 tablet 0    B-D UF III MINI PEN NEEDLES 31G X 5 MM MISC USE AS DIRECTED FOR INSULIN PEN      ASPIRIN LOW DOSE 81 MG EC tablet       nitroGLYCERIN (NITROSTAT) 0.4 MG SL tablet as needed for chest pain Sublingual 1 tablet every 5 minutes X 3 for 30 days       No current facility-administered medications for this visit.     No Known Allergies  Family History: reviewed/updated as indicated  Social History: reviewed/updated  as indicated  Past Medical History:   Diagnosis Date    CAD (coronary artery disease)     Dx 2006    CKD (chronic kidney disease) stage 3, GFR 30-59 ml/min (HCC)     Creatinine range 1.6-1.8    Elevated uric acid in blood     Range 6.1-7.1    GERD (gastroesophageal reflux disease)     EGD 2021 irregular Z-line, large gastric bezoar-Dr. Roanna Raider    History of prostate cancer     PSA 4.2 (active surveillance)-Dr. Baldo Ash, [place]    Hypertension     Ischemic cardiomyopathy     Medicare annual wellness visit, subsequent     01/19/2021 creatinine 1.8, glucose 328, hemoglobin A1c 11.7 (9.2, 7.3), LDL 60/TG 351 (190) uric acid    Mixed hyperlipidemia     TG range [phone])/LDL range 60-190 (goal <70)    Poorly controlled type 1 diabetes mellitus with complication ([place]) 3016    HgmA1C range 7.3-11.7 (C-peptide 12.4 10/2019)     Past Surgical History:   Procedure Laterality Date    CARDIAC CATHETERIZATION Left 10/2018    LHC- non-stemi 95% LM, 100% LAD, 100% circumflex, 100% RCS lesion/no intervention    COLONOSCOPY  2003    normal, cologuard postive test 02/26/2021 - needs new colonoscopy    CORONARY ARTERY BYPASS GRAFT  2006    CABG x4 and multiple cardiac caths no stents    KIDNEY STONE REMOVAL  1978    KNEE SURGERY Left 1974    left knee open meniscus    LUMBAR FUSION  2009    L4-5    PROSTATE BIOPSY  2014    SHOULDER OPEN ROTATOR CUFF REPAIR Left 2006    2002 left shoulder impingment    TURP  2011    TURP vaporization           ROS:   General/Constitutional:   Patient denies fever, shakes or chills, excessive weight gain or weight loss.  Comments highest weight 10/2019 259 pounds, lowest 240 pounds 01/2019, current weight 243 pounds (260 pounds-02/2021)  Endocrine:   Patient denies excessive thirst, hunger or frequent urination.   Respiratory:   Patient denies cough, shortness of breath, wheezing or hemoptysis.   Cardiovascular:   Patient denies chest pain, palpatations or syncope.   Gastrointestinal:   Patient denies  abdominal pain, nausea, vomiting or diarrhea, blood in stool.   Men Only:  Comments active surveillance for prostate cancer planning repeat biopsy by history  Musculoskeletal:   Patient denies leg swelling, painful or swollen joints.   Skin:   Patient denies rash.   Neurologic:   Patient denies dizziness, loss of use of extremity or focal neurologic deficit.   Psychiatric:   Patient denies anxiety, depression or suicidal thoughts.     Vital signs: BP 137/74 (Site: Left Upper Arm, Position: Sitting, Cuff Size: Large Adult)  Pulse 88   Temp 98.6 F (37 C) (Temporal)   Resp 16   Ht 6' (1.829 m)   Wt 243 lb (110.2 kg)   SpO2 98%   BMI 32.96 kg/m       Physical Examination:   GENERAL APPEARANCE: well developed and well nourished in NAD.   HEAD: Atraumatic and normocephalic  EYES: PERRLA, EOMI conjunctiva clear and sclera nonicteric.   NECK: supple with FROM and no thyromegaly or mass, -carotid bruit or thrill .   HEART: RRR without murmurs, clicks or rubs.   LUNGS: CTA in all fields without rales, whezes or rubs.   ABDOMEN: soft with positive BS, nontender, no hepatosplenomegaly, no guarding or rebound and no masses or hernias.   EXTREMITIES: FROM without clubbing, cyanosis or edema.  BACK: FROM, no point tenderness, no bony stepoff or crepitus.   SKIN: warm and dry without rash or suspicious lesions.   NEUROLOGIC: CN II - XII normal, motor strength 5/5 throughout and sensory exam intact.   PSYCH: cognitive function intact, judgement and insight good, mood/affect full range.    Assessment/Plan:  1. Coronary artery disease involving native coronary artery of native heart with angina pectoris [facility])  Comments:  Updated med list, ordered labs, recommended all specialists in 1 location.  Currently cardiology in [place] records  Orders:  -     clopidogrel (PLAVIX) 75 MG tablet; 1 tablet Orally Once per day for 90 days, Disp-100 tablet, R-0Normal  -     isosorbide mononitrate (IMDUR) 120 MG extended release  tablet; Take 1 tablet by mouth 2 times daily, Disp-200 tablet, R-0Normal  -     ranolazine (RANEXA) 500 MG extended release tablet; Take 1 tablet by mouth 2 times daily, Disp-200 tablet, R-0Normal  2. Ischemic cardiomyopathy  Comments:  Weight down, lungs clear, no chest pain  Orders:  -     sacubitril-valsartan (ENTRESTO) 49-51 MG per tablet; 1 tablet Orally Twice a day, Disp-200 tablet, R-0Normal  -     furosemide (LASIX) 40 MG tablet; Take 1 tablet by mouth daily, Disp-90 tablet, R-0Normal  3. Type 2 diabetes mellitus without complication, unspecified whether long term insulin use ([place])  Comments:  Med list updated, labs pending.  Recommended endocrinology 1 location  Orders:  -     OZEMPIC, 0.25 OR 0.5 MG/DOSE, 2 MG/3ML SOPN; 0.5 mg/week subcu, Disp-9 mL, R-0, DAWNormal  -     LANTUS SOLOSTAR 100 UNIT/ML injection pen; ADMINISTER 30 UNITS UNDER THE SKIN TWICE DAILY, Disp-5 Adjustable Dose Pre-filled Pen Syringe, R-0, DAWNormal  4. Stage 3b chronic kidney disease ([place])  Comments:  Creatinine 2.2 from 2022 in [place]/patient's phone.  May have to discontinue [facility] cardiology aware.  Recommend local nephrology  5. Hypercholesterolemia  Comments:  Labs pending  Orders:  -     ezetimibe (ZETIA) 10 MG tablet; 1 tablet Orally Once a day, Disp-100 tablet, R-0Normal  -     rosuvastatin (CRESTOR) 40 MG tablet; 1 tablet Orally Once a day, Disp-100 tablet, R-0Normal  6. Essential hypertension  Comments:  Medication list updated/multiple meds.  BP controlled  Orders:  -     amLODIPine (NORVASC) 5 MG tablet; Take 1 tablet by mouth daily, Disp-100 tablet, R-0Normal  -     metoprolol succinate (TOPROL XL) 50 MG extended release tablet; Take 1 tablet by mouth daily At noon, Disp-100 tablet, R-0Normal  7. Gastroesophageal reflux disease without esophagitis  Comments:  Patient taking H2 blocker but still having symptoms/refilled Nexium.  Has bezoar lost to follow-up with Dr. Roanna Raider  Orders:  -     famotidine (PEPCID) 40  MG tablet; 1 tablet at bedtime Orally Once a day for 90 days, Disp-90 tablet, R-0Normal  -     esomeprazole (NEXIUM) 40 MG delayed release capsule; Take 1 capsule by mouth every morning (before breakfast), Disp-90 capsule, R-0Normal  8. Gastric bezoar, subsequent encounter  9. Prostate cancer [facility])  Comments:  Recommend local urologist  Orders:  -     finasteride (PROSCAR) 5 MG tablet; 1 tablet Orally Once per day at 12 noon for 90 days, Disp-100 tablet, R-0Normal  10. Medically noncompliant  Comments:  Discussed high risk of death within the next 1 to 5 years if ongoing medical noncompliance/medical conditions not controlled  11. Diarrhea, unspecified type  Comments:  Unfortunately could not address patient's complaint of diarrhea on this visit/too many complicating medical conditions.  No signs of dehydration      Follow-up and Dispositions    Return in about 4 weeks (around 05/25/2022) for Physical with labs.       Time documentation as follows: 20 minutes reviewing old records, labs and previous visit note, 20 minutes discussing history of present illness/complaint as well as discussing treatment and plan, 5 minutes performing pertinent exam, 15 minutes documenting chart for total time of 60 minutes

## 2022-04-28 LAB — COMPREHENSIVE METABOLIC PANEL
ALT: 25 U/L (ref 0–50)
AST: 27 U/L (ref 0–50)
Albumin/Globulin Ratio: 1.6 (ref 1.00–2.70)
Albumin: 4.4 g/dL (ref 3.5–5.2)
Alk Phosphatase: 71 U/L (ref 40–130)
Anion Gap: 14 mmol/L (ref 2–17)
BUN: 41 mg/dL — ABNORMAL HIGH (ref 8–23)
CO2: 20 mmol/L — ABNORMAL LOW (ref 22–29)
Calcium: 9.6 mg/dL (ref 8.8–10.2)
Chloride: 102 mmol/L (ref 98–107)
Creatinine: 2 mg/dL — ABNORMAL HIGH (ref 0.7–1.3)
Est, Glom Filt Rate: 35 mL/min/1.73m — ABNORMAL LOW (ref >=60)
Globulin: 2.8 g/dL (ref 1.9–4.4)
Glucose: 185 mg/dL — ABNORMAL HIGH (ref 70–99)
OSMOLALITY CALCULATED: 286 mOsm/kg (ref 270–287)
Potassium: 5.5 mmol/L — ABNORMAL HIGH (ref 3.5–5.3)
Sodium: 136 mmol/L (ref 135–145)
Total Bilirubin: 1.57 mg/dL — ABNORMAL HIGH (ref 0.00–1.20)
Total Protein: 7.2 g/dL (ref 6.4–8.3)

## 2022-04-28 LAB — URINALYSIS W/ RFLX MICROSCOPIC
Blood, Urine: NEGATIVE
Glucose, UA: 100 mg/dL — AB
Ketones, Urine: NEGATIVE
Leukocyte Esterase, Urine: NEGATIVE
Nitrite, Urine: NEGATIVE
Protein, UA: >=300 — AB
Specific Gravity, UA: >=1.03 — AB (ref 1.003–1.035)
Urobilinogen, Urine: 0.2 EU/dL
pH, UA: 5.5 (ref 4.5–8.0)

## 2022-04-28 LAB — URIC ACID: Uric Acid: 7.5 mg/dL — ABNORMAL HIGH (ref 3.4–7.0)

## 2022-04-28 LAB — LIPID PANEL
Chol/HDL Ratio: 3.7 (ref 0.0–4.4)
Cholesterol: 176 mg/dL (ref 100–200)
HDL: 47 mg/dL (ref >=40)
LDL Cholesterol: 87.8 mg/dL (ref 0.0–100.0)
LDL/HDL Ratio: 1.9
Triglycerides: 206 mg/dL — ABNORMAL HIGH (ref 0–149)
VLDL: 41.2 mg/dL — ABNORMAL HIGH (ref 5.0–40.0)

## 2022-04-28 LAB — TSH WITH REFLEX: TSH: 1.5 mcIU/mL (ref 0.358–3.740)

## 2022-04-28 LAB — PSA SCREENING: Screening PSA: 6.1 ng/mL — ABNORMAL HIGH (ref 0.000–4.000)

## 2022-04-29 LAB — HEMOGLOBIN A1C
Est. Avg. Glucose, WB: 226
Est. Avg. Glucose-calculated: 261
Hemoglobin A1C: 9.5 % — ABNORMAL HIGH (ref 4.0–6.0)

## 2022-04-29 LAB — CBC WITH AUTO DIFFERENTIAL
Absolute Baso #: 0 10*3/uL (ref 0.0–0.2)
Absolute Eos #: 0.3 10*3/uL (ref 0.0–0.5)
Absolute Lymph #: 1.2 10*3/uL (ref 1.0–3.2)
Absolute Mono #: 0.6 10*3/uL (ref 0.3–1.0)
Basophils %: 0.4 % (ref 0.0–2.0)
Eosinophils %: 3.4 % (ref 0.0–7.0)
Hematocrit: 42.5 % (ref 38.0–52.0)
Hemoglobin: 13.8 g/dL (ref 13.0–17.3)
Immature Grans (Abs): 0.02 10*3/uL (ref 0.00–0.06)
Immature Granulocytes: 0.3 % (ref 0.0–0.6)
Lymphocytes: 15.5 % (ref 15.0–45.0)
MCH: 30.1 pg (ref 27.0–34.5)
MCHC: 32.5 g/dL (ref 32.0–36.0)
MCV: 92.6 fL (ref 84.0–100.0)
MPV: 11.5 fL (ref 7.2–13.2)
Monocytes: 7.5 % (ref 4.0–12.0)
NRBC Absolute: 0 10*3/uL (ref 0.000–0.012)
NRBC Automated: 0 % (ref 0.0–0.2)
Neutrophils %: 72.9 % (ref 42.0–74.0)
Neutrophils Absolute: 5.4 10*3/uL (ref 1.6–7.3)
Platelets: 165 10*3/uL (ref 140–440)
RBC: 4.59 x10e6/mcL (ref 4.00–5.60)
RDW: 14 % (ref 11.0–16.0)
WBC: 7.5 10*3/uL (ref 3.8–10.6)

## 2022-04-29 LAB — MICROSCOPIC URINALYSIS
Amorphous, UA: NONE SEEN /HPF
Hyaline Casts, UA: >10 /LPF — AB

## 2022-04-29 LAB — MICROALBUMIN, UR: Microalb, Ur: 265 mg/dL — ABNORMAL HIGH (ref 0.0–20.0)

## 2022-05-18 ENCOUNTER — Other Ambulatory Visit: Payer: Self-pay | Admitting: Internal Medicine

## 2022-05-18 DIAGNOSIS — I25119 Atherosclerotic heart disease of native coronary artery with unspecified angina pectoris: Secondary | ICD-10-CM

## 2022-05-18 MED ORDER — NITROGLYCERIN 0.4 MG SL SUBL
0.4 MG | ORAL_TABLET | SUBLINGUAL | 0 refills | Status: AC
Start: 2022-05-18 — End: ?

## 2022-05-19 ENCOUNTER — Ambulatory Visit (HOSPITAL_BASED_OUTPATIENT_CLINIC_OR_DEPARTMENT_OTHER): Payer: Medicare Other | Admitting: Cardiology

## 2022-05-19 ENCOUNTER — Encounter (HOSPITAL_BASED_OUTPATIENT_CLINIC_OR_DEPARTMENT_OTHER): Payer: Self-pay | Admitting: Cardiology

## 2022-05-19 VITALS — BP 130/70 | HR 63 | Ht 72.0 in | Wt 248.0 lb

## 2022-05-19 DIAGNOSIS — Z794 Long term (current) use of insulin: Secondary | ICD-10-CM | POA: Diagnosis not present

## 2022-05-19 DIAGNOSIS — I5042 Chronic combined systolic (congestive) and diastolic (congestive) heart failure: Secondary | ICD-10-CM

## 2022-05-19 DIAGNOSIS — I25118 Atherosclerotic heart disease of native coronary artery with other forms of angina pectoris: Secondary | ICD-10-CM | POA: Diagnosis not present

## 2022-05-19 DIAGNOSIS — I10 Essential (primary) hypertension: Secondary | ICD-10-CM

## 2022-05-19 DIAGNOSIS — R400 Somnolence: Secondary | ICD-10-CM | POA: Diagnosis not present

## 2022-05-19 DIAGNOSIS — E1159 Type 2 diabetes mellitus with other circulatory complications: Secondary | ICD-10-CM | POA: Diagnosis not present

## 2022-05-19 DIAGNOSIS — E785 Hyperlipidemia, unspecified: Secondary | ICD-10-CM

## 2022-05-19 DIAGNOSIS — Z951 Presence of aortocoronary bypass graft: Secondary | ICD-10-CM

## 2022-05-19 NOTE — Patient Instructions (Signed)
Medication Instructions:  Your Physician recommend you continue on your current medication as directed.    *If you need a refill on your cardiac medications before your next appointment, please call your pharmacy*   Lab Work: None ordered today   Testing/Procedures: WatchPAT?  Is a FDA cleared portable home sleep study test that uses a watch and 3 points of contact to monitor 7 different channels, including your heart rate, oxygen saturations, body position, snoring, and chest motion.  The study is easy to use from the comfort of your own home and accurately detect sleep apnea.  Before bed, you attach the chest sensor, attached the sleep apnea bracelet to your nondominant hand, and attach the finger probe.  After the study, the raw data is downloaded from the watch and scored for apnea events.   For more information: [email or web address]  Patient Testing Instructions:  Do not put battery into the device until bedtime when you are ready to begin the test. Please call the support number if you need assistance after following the instructions below: 24 hour support line- [phone] or [facility] support at [phone] (option 2)  Download the [employer] AmericanWatchPAT One" app through the google play store or App Store  Be sure to turn on or enable access to bluetooth in [place] on your smartphone/ device  Make sure no other bluetooth devices are on and within the vicinity of your smartphone/ device and WatchPAT watch during testing.  Make sure to leave your smart phone/ device plugged in and charging all night.  When ready for bed:  Follow the instructions step by step in the WatchPAT One App to activate the testing device. For additional instructions, including video instruction, visit the WatchPAT One video on Youtube. You can search for [place] One within Youtube (video is 4 minutes and 18 seconds) or enter: https://youtube/watch?v=BCce_vbiwxE Please note: You will be prompted  to enter a Pin to connect via bluetooth when starting the test. The PIN will be assigned to you when you receive the test.  The device is disposable, but it recommended that you retain the device until you receive a call letting you know the study has been received and the results have been interpreted.  We will let you know if the study did not transmit to [place] properly after the test is completed. You do not need to call us to confirm the receipt of the test.  Please complete the test within 48 hours of receiving PIN.   Frequently Asked Questions:  What is Watch [place] one?  A single use fully disposable home sleep apnea testing device and will not need to be returned after completion.  What are the requirements to use WatchPAT one?  The be able to have a successful watchpat one sleep study, you should have your Watch pat one device, your smart phone, watch pat one app, your PIN number and Internet access What type of phone do I need?  You should have a smart phone that uses Android 5.1 and above or any Iphone with IOS 10 and above How can I download the WatchPAT one app?  Based on your device type search for WatchPAT one app either in google play for android devices or APP store for Iphone's Where will I get my PIN for the study?  Your PIN will be provided by your physician's office. It is used for authentication and if you lose/forget your PIN, please reach out to your providers office.  I do not have Internet at home.  Can I do WatchPAT one study?  WatchPAT One needs Internet connection throughout the night to be able to transmit the sleep data. You can use your home/local internet or your cellular's data package. However, it is always recommended to use home/local Internet. It is estimated that between 20MB-30MB will be used with each study.However, the application will be looking for 80MB space in the phone to start the study.  What happens if I lose internet or bluetooth connection?  During the  internet disconnection, your phone will not be able to transmit the sleep data. All the data, will be stored in your phone. As soon as the internet connection is back on, the phone will being sending the sleep data. During the bluetooth disconnection, WatchPAT one will not be able to to send the sleep data to your phone. Data will be kept in the [facility] one until two devices have bluetooth connection back on. As soon as the connection is back on, WatchPAT one will send the sleep data to the phone.  How long do I need to wear the WatchPAT one?  After you start the study, you should wear the device at least 6 hours.  How far should I keep my phone from the device?  During the night, your phone should be within 15 feet.  What happens if I leave the room for restroom or other reasons?  Leaving the room for any reason will not cause any problem. As soon as your get back to the room, both devices will reconnect and will continue to send the sleep data. Can I use my phone during the sleep study?  Yes, you can use your phone as usual during the study. But it is recommended to put your watchpat one on when you are ready to go to bed.  How will I get my study results?  A soon as you completed your study, your sleep data will be sent to the provider. They will then share the results with you when they are ready.     Follow-Up: At [facility], you and your health needs are our priority.  As part of our continuing mission to provide you with exceptional heart care, we have created designated Provider Care Teams.  These Care Teams include your primary Cardiologist (physician) and Advanced Practice Providers (APPs -  Physician Assistants and Nurse Practitioners) who all work together to provide you with the care you need, when you need it.  We recommend signing up for the patient portal called "MyChart".  Sign up information is provided on this After Visit Summary.  MyChart is used to connect with  patients for Virtual Visits (Telemedicine).  Patients are able to view lab/test results, encounter notes, upcoming appointments, etc.  Non-urgent messages can be sent to your provider as well.   To learn more about what you can do with MyChart, go to [email or web address].    Your next appointment:   6 month(s)  The format for your next appointment:   In Person  Provider:   Buford Dresser, MD

## 2022-05-19 NOTE — Progress Notes (Signed)
Cardiology Office Note:    Date:  05/19/2022   ID:  Russell Trevino, DOB 1951/08/04, MRN [id number]  PCP:  System, Provider Not In  Cardiologist:  Buford Dresser, MD PhD  Referring MD: No ref. provider found   CC: follow up   History of Present Illness:    Russell Trevino is a 71 y.o. male with a hx of CAD s/p prior CABG, NSTEMI 10/2018, ischemic cardiomyopathy with EF 40-45% who is seen for follow up.   Cardiac history: Hospitalized 10/2018 for NSTEMI and heart failure symptoms. Severe native CAD, no intervenable targets on cath. Recommended for medication management. Prior history notable for NSTEMI in 2006 that lead to CABG, with LIMA-LAD, L Radial-OM-D1, SVG-PDA. Noted to have ischemic cardiomyopathy with EF 40-45%. He was readmitted and discharged with heart failure after running out of furosemide for two weeks.    He saw Laurann Montana, NP on 12/28/2021 where he was referred to cardiac rehab. His hospitalization was reviewed at length. Per her notes: He was admitted 12/16/2021 after presenting with chest pain. It was unclear which of his medications he was or was not taking. Echo 12/11/2021 LVEF 25% with septal and inferior wall motion abnormalities worse than previous.  He underwent LHC showing native left main occlusion with patent arterial graft.  Plan was to potentially add ARNI in outpatient setting.  He followed up with Laurann Montana, NP on 04/25/2022 where he was doing well with stable exertional dyspnea. He needed pre-op clearance for prostate biopsy.  Today: He states that he is feeling okay. However, he is concerned that after waking up in the morning he still feels tired, which is bothersome. Even with following his exercise routines he did not notice much improvement in his fatigue. He denies snoring. Nobody has told him he stops breathing when he sleeps.   Lately he has not had as much issues with swelling. If he does he will take Lasix, but he has not needed to take it in  a while.  For exercise he walks 3 times a week, about 20-25 minutes a day. No anginal symptoms. Lately he has been feeling some increase in his exercise tolerance, but he still struggles with his overall fatigue.  In clinic today his BP is 130/70 which he notes is higher for him. At home his blood pressure is usually well controlled in the 338'S systolic.   This morning his blood sugar was 164. He is no longer taking Jardiance. Currently on 0.5 mg dose of Ozempic. He has not yet noticed any significant weight loss or other benefits.  He denies any palpitations, chest pain, or shortness of breath. No lightheadedness, headaches, syncope, orthopnea, or PND.    Past Medical History:  Diagnosis Date   BPH (benign prostatic hypertrophy)    Cardiomyopathy, ischemic    Coronary artery disease    at [facility], [place] 2009 (GRAFTS PATENT), cath 05/2018 w/ SVG-PDA 100%>>med rx   Diabetes mellitus, type 2 (HCC)    Frequency of urination    GERD (gastroesophageal reflux disease)    Gross hematuria    History of CHF (congestive heart failure) 2007   History of kidney stones    History of myocardial infarction 2006, 2019   2006>>CABG, 2019 cath w/ med rx   History of urinary retention    Hyperlipidemia    Nocturia    Renal calculi    BILATERAL PER CT SCAN--  NON-OBSTRUCTIVE   S/P CABG x 4 2006    Past Surgical  History:  Procedure Laterality Date   CARDIAC CATHETERIZATION  01/17/2008   GRAFTS PATENT / EF 40%,  DR SANTOS (B[facility]TIST)   CORONARY ARTERY BYPASS GRAFT  04/2005   LIMA-LAD, L radial-OM-D1 and SVG-PDA   CYSTO / RIGHT URETERAL STENT PLACEMENT  10-08-1999   CYSTO/  REMOVAL BLADDER STONES  01-14-2010   CYSTOSCOPY  10/16/2012   Procedure: CYSTOSCOPY;  Surgeon: Malka So, MD;  Location: [facility];  Service: Urology;  Laterality: N/A;  Cystoscopy with Clot Evacuation and fulguration bladder neck.   EXTRACORPOREAL SHOCK WAVE LITHOTRIPSY  10-15-2009   RIGHT   KNEE  ARTHROSCOPY  1978   RIGHT   LEFT HEART CATH AND CORS/GRAFTS ANGIOGRAPHY N/A 12/17/2021   Procedure: LEFT HEART CATH AND CORS/GRAFTS ANGIOGRAPHY;  Surgeon: [name], MD;  Location: [place] [facility];  Service: Cardiovascular;  Laterality: N/A;   LEFT SHOULDER SURGERY   2002   LUMBAR FUSION  09-10-2008   L4 - L5   PERCUTANEOUS NEPHROSTOLITHOTOMY  1979   PROSTATE BIOPSY  10/16/2012   Procedure: PROSTATE BIOPSY;  Surgeon: Malka So, MD;  Location: [facility];  Service: Urology;  Laterality: N/A;  Through ultrasound.   RIGHT/LEFT HEART CATH AND CORONARY/GRAFT ANGIOGRAPHY N/A 10/26/2018   Procedure: RIGHT/LEFT HEART CATH AND CORONARY/GRAFT ANGIOGRAPHY;  Surgeon: Martinique, Peter M, MD;  Location: [place] [facility];  Service: Cardiovascular;  Laterality: N/A;   SHOULDER ARTHROSCOPY W/ SUBACROMIAL DECOMPRESSION AND DISTAL CLAVICLE EXCISION  10-11-2004   AND PARTIAL ROTATOR CUFF REPAIR   TRANSTHORACIC ECHOCARDIOGRAM  81/82/9937   LV SYSTOLIC FUNCTION MILDLY REDUCED/ EF 48%/ LV FILLING PATTERN  IS IMPAIRED/ HYPOKINESIS IN THE MID AND BASALAR SEPTUM & ANTEROSEPTUM   TURP VAPORIZATION  01-19-2010   BPH W/ BOO   URETEROLITHOTOMY  1976    Current Medications: Current Outpatient Medications on File Prior to Visit  Medication Sig   aspirin 81 MG EC tablet Take 1 tablet (81 mg total) by mouth in the morning.   clopidogrel (PLAVIX) 75 MG tablet Take 1 tablet (75 mg total) by mouth daily.   cyclobenzaprine (FLEXERIL) 10 MG tablet Take 10 mg by mouth at bedtime as needed for muscle spasms.   ezetimibe (ZETIA) 10 MG tablet Take 1 tablet (10 mg total) by mouth every evening.   finasteride (PROSCAR) 5 MG tablet TAKE 1 TABLET(5 MG) BY MOUTH DAILY (Patient taking differently: Take 5 mg by mouth at bedtime.)   furosemide (LASIX) 40 MG tablet Take 40 mg by mouth as needed.   gabapentin (NEURONTIN) 100 MG capsule Take 1 cap in morning and 2 cap HS (Patient taking differently: 100 mg daily as  needed (for neuropathy).)   insulin glargine (LANTUS SOLOSTAR) 100 UNIT/ML Solostar Pen Inject 30 Units into the skin 2 (two) times daily.   Insulin Pen Needle 31G X 6 MM MISC UAD for insulin pen   Insulin Syringe-Needle U-100 (BD VEO INSULIN SYRINGE U/F) 31G X 15/64" 1 ML MISC use as directed TO INJECT INSULIN  twice a day   isosorbide mononitrate (IMDUR) 120 MG 24 hr tablet Take 1 tablet (120 mg total) by mouth 2 (two) times daily.   metoprolol succinate (TOPROL-XL) 50 MG 24 hr tablet Take 1 tablet (50 mg total) by mouth daily at 12 noon. Take with or immediately following a meal.   nitroGLYCERIN (NITROSTAT) 0.4 MG SL tablet PLACE 1 TABLET UNDER THE TONGUE EVERY 5 MINS FOR CHEST FOR PAIN   ranolazine (RANEXA) 500 MG 12  hr tablet Take 1 tablet (500 mg total) by mouth 2 (two) times daily.   rosuvastatin (CRESTOR) 40 MG tablet Take 1 tablet (40 mg total) by mouth daily.   sacubitril-valsartan (ENTRESTO) 49-51 MG Take 1 tablet by mouth 2 (two) times daily.   Semaglutide,0.25 or 0.'5MG'$ /DOS, (OZEMPIC, 0.25 OR 0.5 MG/DOSE,) 2 MG/1.5ML SOPN Inject 0.5 mg into the skin every Wednesday.   No current facility-administered medications on file prior to visit.     Allergies:   Patient has no known allergies.   Social History   Tobacco Use   Smoking status: Never   Smokeless tobacco: Never  Vaping Use   Vaping Use: Never used  Substance Use Topics   Alcohol use: Yes    Alcohol/week: 2.0 standard drinks of alcohol    Types: 2 Cans of beer per week   Drug use: No    Family History: The patient's family history includes Diabetes in his father; Lung cancer in his mother. There is no history of Stomach cancer, Colon cancer, Rectal cancer, Esophageal cancer, or Pancreatic cancer.  ROS:   Please see the history of present illness.   (+) Fatigue (+) Daytime somnolence Additional pertinent ROS negative except as noted in HPI.    EKGs/Labs/Other Studies Reviewed:    The following studies were  reviewed today:  Left Heart Cath  12/17/2021:   Mid LM to Dist LM lesion is 100% stenosed.   Prox LAD lesion is 100% stenosed.   Prox Cx lesion is 100% stenosed.   Prox RCA to Dist RCA lesion is 100% stenosed.   Dist LAD lesion is 60% stenosed.   LV end diastolic pressure is normal.   1.  Patent LIMA to LAD with radial off the LIMA graft to diagonal and obtuse marginal branches. 2.  Vein graft right coronary artery known occluded and not imaged. 3.  Severe native vessel disease with known occlusion of right coronary artery which was not imaged on the study and newly occluded left main lesion. 4.  LVEDP of 16 mmHg.   The results were reviewed with Dr. Gasper Sells and optimal medical therapy will be pursued.  Diagnostic Dominance: Right    Echo  12/17/2021: Sonographer Comments: Image acquisition challenging due to patient body habitus.  IMPRESSIONS    1. Left ventricular ejection fraction, by estimation, is 20 to 25%. The  left ventricle has severely decreased function. The left ventricle  demonstrates regional wall motion abnormalities (see scoring  diagram/findings for description). The left  ventricular internal cavity size was moderately dilated. Left ventricular  diastolic parameters are indeterminate.   2. Right ventricular systolic function is moderately reduced. The right  ventricular size is normal.   3. Left atrial size was mildly dilated.   4. The mitral valve is grossly normal. Trivial mitral valve  regurgitation. No evidence of mitral stenosis.   5. The aortic valve was not well visualized. Aortic valve regurgitation  is not visualized. No aortic stenosis is present.   6. Aortic dilatation noted. There is mild dilatation of the ascending  aorta, measuring 41 mm.   Comparison(s): Changes from prior study are noted.   Conclusion(s)/Recommendation(s): Wall motion better seen on current study. Diffuse hypokinesis with akinesis predominantly in the inferoseptal and  inferior walls. No evidence of LV thrombus. Severely reduced LVEF, 20-25%.   Echo 10/26/18 1. The left ventricle has mild-moderately reduced systolic function, with an ejection fraction of 40-45%. The cavity size was severely dilated. Left ventricular diastolic Doppler parameters are consistent  with pseudonormalization Elevated left ventricular end-diastolic pressure.  2. Poor images preclude accurate assessment of wall motion even with definity.  3. The right ventricle has normal systolic function. The cavity was normal. There is no increase in right ventricular wall thickness.  4. Left atrial size was moderately dilated.  5. The mitral valve is normal in structure. Mitral valve regurgitation is moderate by color flow Doppler.  6. The tricuspid valve is normal in structure.  7. The aortic valve is tricuspid.  8. The pulmonic valve was normal in structure.  9. There is mild dilatation of the ascending aorta measuring 38 mm. 10. Right atrial pressure is estimated at 3 mmHg.  R/LHC 10/26/18 Mid LM to Dist LM lesion is 95% stenosed. Prox LAD lesion is 100% stenosed. Prox Cx lesion is 100% stenosed. Prox RCA to Dist RCA lesion is 100% stenosed. LV end diastolic pressure is normal. Hemodynamic findings consistent with mild pulmonary hypertension.   1. Occlusive native vessel CAD.     -95% distal left main    - 100% proximal LAD after the first septal perforator    - 100% LCx after a very small OM1.    - 100% proximal RCA 2. Patent LIMA to the LAD 3. Patent free radial graft arising from the mid LIMA graft and supplying the first diagonal and OM 4. SVG to RCA is known to be occluded. The distal RCA is supplied by collaterals.  5. Normal LV filling pressures 6. Mild pulmonary HTN. 7. Preserved cardiac output.   Plan: continue medical therapy for CHF. Will switch IV lasix to po. Stop IV Ntg and heparin.   EKG:  EKG is personally reviewed.   05/19/2022:  EKG was not ordered. 01/28/2021: NSR,  iLBBB, nonspecific ST changes 08/30/19: sinus bradycardia at 56 bpm, iLBBB. Inferior/lateral ST changes similar location but more pronounced than prior  Recent Labs: 12/17/2021: TSH 1.259 12/24/2021: ALT 16; Hemoglobin 14.1; Platelets 184.0 04/25/2022: BNP 7.3; BUN 43; Creatinine, Ser 2.20; Potassium 5.6; Sodium 135   Recent Lipid Panel    Component Value Date/Time   CHOL 345 (H) 12/17/2021 0118   TRIG 338 (H) 12/17/2021 0118   HDL 46 12/17/2021 0118   CHOLHDL 7.5 12/17/2021 0118   VLDL 68 (H) 12/17/2021 0118   LDLCALC 231 (H) 12/17/2021 0118   LDLDIRECT 66.0 03/24/2021 0859    Physical Exam:    VS:  BP 130/70 (BP Location: Right Arm, Patient Position: Sitting, Cuff Size: Normal)   Pulse 63   Ht 6' (1.829 m)   Wt 248 lb (112.5 kg)   SpO2 97%   BMI 33.63 kg/m     Wt Readings from Last 3 Encounters:  05/19/22 248 lb (112.5 kg)  04/25/22 247 lb (112 kg)  12/28/21 246 lb (111.6 kg)   GEN: Well nourished, well developed in no acute distress HEENT: Normal, moist mucous membranes NECK: No JVD CARDIAC: regular rhythm, normal S1 and S2, no rubs or gallops. No murmur. VASCULAR: Radial and DP pulses 2+ bilaterally. No carotid bruits RESPIRATORY:  Clear to auscultation without rales, wheezing or rhonchi  ABDOMEN: Soft, non-tender, non-distended MUSCULOSKELETAL:  Ambulates independently SKIN: Warm and dry, 1+ LLE pitting edema (chronic), no significant RLE edema NEUROLOGIC:  Alert and oriented x 3. No focal neuro deficits noted. PSYCHIATRIC:  Normal affect   ASSESSMENT:    1. Daytime somnolence   2. Coronary artery disease of native artery of native heart with stable angina pectoris ([place])   3. Hx of CABG  4. Type 2 diabetes mellitus with other circulatory complication, with long-term current use of insulin ([place])   5. Chronic combined systolic and diastolic heart failure ([place])   6. Essential hypertension   7. Hyperlipidemia LDL goal <70     PLAN:    Daytime  somnolence: -denies snoring, but has never been checked for sleep apnea -concern is that he is tired when he wakes up, not gradually tired as the day goes on. This suggests poor quality sleep -will order home sleep study today, STOPBANG=5  CAD s/p CABG 2006: known SVG-PDA is occluded, other grafts are patent. No intervenable targets. Severe native disease -most recent cath 12/2021 as above -see prior discussion, continuing DAPT indefinitely -on metoprolol succinate 50 mg daily -on imdur 120 mg BID, feels better than taking daily -on ranexa 500 mg BID -previously on amlodipine as antianginal, not on current med list -has sublingual nitroglycerin -counseled on life threatening hypotension with isosorbide, nitroglycerin and sildenafil/erectile dysfunction meds in this class. Would not use PDE5i given this. -no recent chest pain. Actually slowly increasing his activity without symptoms  Chronic systolic and diastolic heart failure Ischemic cardiomyopathy -NYHA class II symptoms today, stable to improving with exercise -most recent EF 20-25% 12/2021, was off of medications. -continue metoprolol succinate 50 mg daily -tolerating entresto 49-51 mg BID -continue lasix 40 mg as needed. Has CKD stage 3b, previously referred to nephrology, does not appear that this occurred. -educated on heart failure, including daily weights, sodium and fluid guidelines, signs/symptoms to watch for, diet and activity recommendations -on empagliflozin (SGLT2i) previously. This was stopped when he was started on semaglutide. He is on only 0.5 mg weekly dose semaglutide. From a cardiac perspective, I would recommend intensifying semaglutide dose and restarting SGLT2i. Last GFR 35 04/27/22 per outside records. He may need to have his insulin adjusted with these changes, he will discuss with his PCP Dr. Gwyneth Sprout. -he [facility] and [facility]. His PCP is available in Care Everywhere. Agree that there is difficulty in  coordinating care as he has doctors in different locations. -discuss ICD at follow up  Hypertension: at goal of <130/80, continue medications as listed  Hyperlipidemia, mixed: -LDL goal <70. Last LDL 10/2020 80, then LDL 231 12/17/21 (off of medications). Repeat per Care Everywhere 04/27/22 show LDL 87.8. On rosuvastatin 40 mg, on ezetimibe as well.  -would recommend PCSK9i as he is not at goal. Have discussed vascepa as well. Major issue is cost.  Type II diabetes, on insulin and metformin, with chronic kidney disease stage 3. Most recent A1c 9.5 04/27/22 -on Ozempic 0.5 mg weekly, insulin glargine BID -as above, would recommend intensifying Ozempic and adding SGLT2i, may need adjustment in his insulin with these changes.  Secondary prevention lifestyle recommendations: -recommend heart healthy/Mediterranean diet, with whole grains, fruits, vegetable, fish, lean meats, nuts, and olive oil. Limit salt. -recommend moderate walking, 3-5 times/week for 30-50 minutes each session. Aim for at least 150 minutes.week. Goal should be pace of 3 miles/hours, or walking 1.5 miles in 30 minutes -recommend avoidance of tobacco products. Avoid excess alcohol.  Follow up: 6 months or sooner as needed  Medication Adjustments/Labs and Tests Ordered: Current medicines are reviewed at length with the patient today.  Concerns regarding medicines are outlined above.   Orders Placed This Encounter  Procedures   [facility] Sleep Study   No orders of the defined types were placed in this encounter.  Patient Instructions  Medication Instructions:  Your Physician recommend you continue on your  current medication as directed.    *If you need a refill on your cardiac medications before your next appointment, please call your pharmacy*   Lab Work: None ordered today   Testing/Procedures: WatchPAT?  Is a FDA cleared portable home sleep study test that uses a watch and 3 points of contact to monitor 7 different  channels, including your heart rate, oxygen saturations, body position, snoring, and chest motion.  The study is easy to use from the comfort of your own home and accurately detect sleep apnea.  Before bed, you attach the chest sensor, attached the sleep apnea bracelet to your nondominant hand, and attach the finger probe.  After the study, the raw data is downloaded from the watch and scored for apnea events.   For more information: [email or web address]  Patient Testing Instructions:  Do not put battery into the device until bedtime when you are ready to begin the test. Please call the support number if you need assistance after following the instructions below: 24 hour support line- [phone] or [facility] support at [phone] (option 2)  Download the [employer] AmericanWatchPAT One" app through the google play store or App Store  Be sure to turn on or enable access to bluetooth in settlings on your smartphone/ device  Make sure no other bluetooth devices are on and within the vicinity of your smartphone/ device and WatchPAT watch during testing.  Make sure to leave your smart phone/ device plugged in and charging all night.  When ready for bed:  Follow the instructions step by step in the WatchPAT One App to activate the testing device. For additional instructions, including video instruction, visit the WatchPAT One video on Youtube. You can search for [place] One within Youtube (video is 4 minutes and 18 seconds) or enter: https://youtube/watch?v=BCce_vbiwxE Please note: You will be prompted to enter a Pin to connect via bluetooth when starting the test. The PIN will be assigned to you when you receive the test.  The device is disposable, but it recommended that you retain the device until you receive a call letting you know the study has been received and the results have been interpreted.  We will let you know if the study did not transmit to [place] properly after the test is completed.  You do not need to call us to confirm the receipt of the test.  Please complete the test within 48 hours of receiving PIN.   Frequently Asked Questions:  What is Watch [place] one?  A single use fully disposable home sleep apnea testing device and will not need to be returned after completion.  What are the requirements to use WatchPAT one?  The be able to have a successful watchpat one sleep study, you should have your Watch pat one device, your smart phone, watch pat one app, your PIN number and Internet access What type of phone do I need?  You should have a smart phone that uses Android 5.1 and above or any Iphone with IOS 10 and above How can I download the WatchPAT one app?  Based on your device type search for WatchPAT one app either in google play for android devices or APP store for Iphone's Where will I get my PIN for the study?  Your PIN will be provided by your physician's office. It is used for authentication and if you lose/forget your PIN, please reach out to your providers office.  I do not have Internet at home. Can I do WatchPAT one study?  WatchPAT One  needs Internet connection throughout the night to be able to transmit the sleep data. You can use your home/local internet or your cellular's data package. However, it is always recommended to use home/local Internet. It is estimated that between 20MB-30MB will be used with each study.However, the application will be looking for 80MB space in the phone to start the study.  What happens if I lose internet or bluetooth connection?  During the internet disconnection, your phone will not be able to transmit the sleep data. All the data, will be stored in your phone. As soon as the internet connection is back on, the phone will being sending the sleep data. During the bluetooth disconnection, WatchPAT one will not be able to to send the sleep data to your phone. Data will be kept in the [facility] one until two devices have bluetooth  connection back on. As soon as the connection is back on, WatchPAT one will send the sleep data to the phone.  How long do I need to wear the WatchPAT one?  After you start the study, you should wear the device at least 6 hours.  How far should I keep my phone from the device?  During the night, your phone should be within 15 feet.  What happens if I leave the room for restroom or other reasons?  Leaving the room for any reason will not cause any problem. As soon as your get back to the room, both devices will reconnect and will continue to send the sleep data. Can I use my phone during the sleep study?  Yes, you can use your phone as usual during the study. But it is recommended to put your watchpat one on when you are ready to go to bed.  How will I get my study results?  A soon as you completed your study, your sleep data will be sent to the provider. They will then share the results with you when they are ready.     Follow-Up: At [facility], you and your health needs are our priority.  As part of our continuing mission to provide you with exceptional heart care, we have created designated Provider Care Teams.  These Care Teams include your primary Cardiologist (physician) and Advanced Practice Providers (APPs -  Physician Assistants and Nurse Practitioners) who all work together to provide you with the care you need, when you need it.  We recommend signing up for the patient portal called "MyChart".  Sign up information is provided on this After Visit Summary.  MyChart is used to connect with patients for Virtual Visits (Telemedicine).  Patients are able to view lab/test results, encounter notes, upcoming appointments, etc.  Non-urgent messages can be sent to your provider as well.   To learn more about what you can do with MyChart, go to [email or web address].    Your next appointment:   6 month(s)  The format for your next appointment:   In Person  Provider:   Buford Dresser, MD             [facility],acting as a scribe for Buford Dresser, MD.,have documented all relevant documentation on the behalf of Buford Dresser, MD,as directed by  Buford Dresser, MD while in the presence of Buford Dresser, MD.  I, Buford Dresser, MD, have reviewed all documentation for this visit. The documentation on 05/19/22 for the exam, diagnosis, procedures, and orders are all accurate and complete.  Signed, Buford Dresser, MD PhD 05/19/2022   [facility] 

## 2022-05-19 NOTE — Progress Notes (Signed)
Formatting of this note is different from the original.  Images from the original note were not included.    Cardiology Office Note:      Date:  05/19/2022     ID:  Alfred Weiss, DOB Jul 12, 1951, MRN [id number]    PCP:  System, Provider Not In   Cardiologist:  Buford Dresser, MD PhD    Referring MD: No ref. provider found     CC: follow up     History of Present Illness:      Alfred Weiss is a 71 y.o. male with a hx of CAD s/p prior CABG, NSTEMI 10/2018, ischemic cardiomyopathy with EF 40-45% who is seen for follow up.     Cardiac history: Hospitalized 10/2018 for NSTEMI and heart failure symptoms. Severe native CAD, no intervenable targets on cath. Recommended for medication management. Prior history notable for NSTEMI in 2006 that lead to CABG, with LIMA-LAD, L Radial-OM-D1, SVG-PDA. Noted to have ischemic cardiomyopathy with EF 40-45%. He was readmitted and discharged with heart failure after running out of furosemide for two weeks.      He saw Laurann Montana, NP on 12/28/2021 where he was referred to cardiac rehab. His hospitalization was reviewed at length. Per her notes: He was admitted 12/16/2021 after presenting with chest pain. It was unclear which of his medications he was or was not taking. Echo 12/11/2021 LVEF 25% with septal and inferior wall motion abnormalities worse than previous.  He underwent LHC showing native left main occlusion with patent arterial graft.  Plan was to potentially add ARNI in outpatient setting.    He followed up with Laurann Montana, NP on 04/25/2022 where he was doing well with stable exertional dyspnea. He needed pre-op clearance for prostate biopsy.    Today:  He states that he is feeling okay. However, he is concerned that after waking up in the morning he still feels tired, which is bothersome. Even with following his exercise routines he did not notice much improvement in his fatigue. He denies snoring. Nobody has told him he stops breathing when he sleeps.     Lately he  has not had as much issues with swelling. If he does he will take Lasix, but he has not needed to take it in a while.    For exercise he walks 3 times a week, about 20-25 minutes a day. No anginal symptoms. Lately he has been feeling some increase in his exercise tolerance, but he still struggles with his overall fatigue.    In clinic today his BP is 130/70 which he notes is higher for him. At home his blood pressure is usually well controlled in the 073'X systolic.     This morning his blood sugar was 164. He is no longer taking Jardiance. Currently on 0.5 mg dose of Ozempic. He has not yet noticed any significant weight loss or other benefits.    He denies any palpitations, chest pain, or shortness of breath. No lightheadedness, headaches, syncope, orthopnea, or PND.    Past Medical History:   Diagnosis Date    BPH (benign prostatic hypertrophy)     Cardiomyopathy, ischemic     Coronary artery disease     at [facility], [place] 2009 (GRAFTS PATENT), cath 05/2018 w/ SVG-PDA 100%>>med rx    Diabetes mellitus, type 2 (HCC)     Frequency of urination     GERD (gastroesophageal reflux disease)     Gross hematuria     History of CHF (congestive heart  failure) 2007    History of kidney stones     History of myocardial infarction 2006, 2019    2006>>CABG, 2019 cath w/ med rx    History of urinary retention     Hyperlipidemia     Nocturia     Renal calculi     BILATERAL PER CT SCAN--  NON-OBSTRUCTIVE    S/P CABG x 4 2006     Past Surgical History:   Procedure Laterality Date    CARDIAC CATHETERIZATION  01/17/2008    GRAFTS PATENT / EF 40%,  DR SANTOS ([facility])    CORONARY ARTERY BYPASS GRAFT  04/2005    LIMA-LAD, L radial-OM-D1 and SVG-PDA    CYSTO / RIGHT URETERAL STENT PLACEMENT  10-08-1999    CYSTO/  REMOVAL BLADDER STONES  01-14-2010    CYSTOSCOPY  10/16/2012    Procedure: CYSTOSCOPY;  Surgeon: Malka So, MD;  Location: [facility];  Service: Urology;  Laterality: N/A;  Cystoscopy with Clot Evacuation and  fulguration bladder neck.    EXTRACORPOREAL SHOCK WAVE LITHOTRIPSY  10-15-2009    RIGHT    KNEE ARTHROSCOPY  1978    RIGHT    LEFT HEART CATH AND CORS/GRAFTS ANGIOGRAPHY N/A 12/17/2021    Procedure: LEFT HEART CATH AND CORS/GRAFTS ANGIOGRAPHY;  Surgeon: [name], MD;  Location: [facility];  Service: Cardiovascular;  Laterality: N/A;    LEFT SHOULDER SURGERY   2002    LUMBAR FUSION  09-10-2008    L4 - L5    PERCUTANEOUS NEPHROSTOLITHOTOMY  1979    PROSTATE BIOPSY  10/16/2012    Procedure: PROSTATE BIOPSY;  Surgeon: Malka So, MD;  Location: [facility];  Service: Urology;  Laterality: N/A;  Through ultrasound.    RIGHT/LEFT HEART CATH AND CORONARY/GRAFT ANGIOGRAPHY N/A 10/26/2018    Procedure: RIGHT/LEFT HEART CATH AND CORONARY/GRAFT ANGIOGRAPHY;  Surgeon: Martinique, Peter M, MD;  Location: [facility];  Service: Cardiovascular;  Laterality: N/A;    SHOULDER ARTHROSCOPY W/ SUBACROMIAL DECOMPRESSION AND DISTAL CLAVICLE EXCISION  10-11-2004    AND PARTIAL ROTATOR CUFF REPAIR    TRANSTHORACIC ECHOCARDIOGRAM  40/34/7425    LV SYSTOLIC FUNCTION MILDLY REDUCED/ EF 48%/ LV FILLING PATTERN  IS IMPAIRED/ HYPOKINESIS IN THE MID AND BASALAR SEPTUM & ANTEROSEPTUM    TURP VAPORIZATION  01-19-2010    BPH W/ BOO    URETEROLITHOTOMY  1976     Current Medications:  Current Outpatient Medications on File Prior to Visit   Medication Sig    aspirin 81 MG EC tablet Take 1 tablet (81 mg total) by mouth in the morning.    clopidogrel (PLAVIX) 75 MG tablet Take 1 tablet (75 mg total) by mouth daily.    cyclobenzaprine (FLEXERIL) 10 MG tablet Take 10 mg by mouth at bedtime as needed for muscle spasms.    ezetimibe (ZETIA) 10 MG tablet Take 1 tablet (10 mg total) by mouth every evening.    finasteride (PROSCAR) 5 MG tablet TAKE 1 TABLET(5 MG) BY MOUTH DAILY (Patient taking differently: Take 5 mg by mouth at bedtime.)    furosemide (LASIX) 40 MG tablet Take 40 mg by mouth as needed.    gabapentin (NEURONTIN)  100 MG capsule Take 1 cap in morning and 2 cap HS (Patient taking differently: 100 mg daily as needed (for neuropathy).)    insulin glargine (LANTUS SOLOSTAR) 100 UNIT/ML Solostar Pen Inject 30 Units into the skin 2 (two) times daily.  Insulin Pen Needle 31G X 6 MM MISC UAD for insulin pen    Insulin Syringe-Needle U-100 (BD VEO INSULIN SYRINGE U/F) 31G X 15/64" 1 ML MISC use as directed TO INJECT INSULIN  twice a day    isosorbide mononitrate (IMDUR) 120 MG 24 hr tablet Take 1 tablet (120 mg total) by mouth 2 (two) times daily.    metoprolol succinate (TOPROL-XL) 50 MG 24 hr tablet Take 1 tablet (50 mg total) by mouth daily at 12 noon. Take with or immediately following a meal.    nitroGLYCERIN (NITROSTAT) 0.4 MG SL tablet PLACE 1 TABLET UNDER THE TONGUE EVERY 5 MINS FOR CHEST FOR PAIN    ranolazine (RANEXA) 500 MG 12 hr tablet Take 1 tablet (500 mg total) by mouth 2 (two) times daily.    rosuvastatin (CRESTOR) 40 MG tablet Take 1 tablet (40 mg total) by mouth daily.    sacubitril-valsartan (ENTRESTO) 49-51 MG Take 1 tablet by mouth 2 (two) times daily.    Semaglutide,0.25 or 0.'5MG'$ /DOS, (OZEMPIC, 0.25 OR 0.5 MG/DOSE,) 2 MG/1.5ML SOPN Inject 0.5 mg into the skin every Wednesday.     No current facility-administered medications on file prior to visit.       Allergies:   Patient has no known allergies.     Social History     Tobacco Use    Smoking status: Never    Smokeless tobacco: Never   Vaping Use    Vaping Use: Never used   Substance Use Topics    Alcohol use: Yes     Alcohol/week: 2.0 standard drinks of alcohol     Types: 2 Cans of beer per week    Drug use: No     Family History:  The patient's family history includes Diabetes in his father; Lung cancer in his mother. There is no history of Stomach cancer, Colon cancer, Rectal cancer, Esophageal cancer, or Pancreatic cancer.    ROS:    Please see the history of present illness.    (+) Fatigue  (+) Daytime somnolence  Additional pertinent ROS negative except  as noted in HPI.     EKGs/Labs/Other Studies Reviewed:      The following studies were reviewed today:    Left Heart Cath  12/17/2021:    Mid LM to Dist LM lesion is 100% stenosed.    Prox LAD lesion is 100% stenosed.    Prox Cx lesion is 100% stenosed.    Prox RCA to Dist RCA lesion is 100% stenosed.    Dist LAD lesion is 60% stenosed.    LV end diastolic pressure is normal.    1.  Patent LIMA to LAD with radial off the LIMA graft to diagonal and obtuse marginal branches.  2.  Vein graft right coronary artery known occluded and not imaged.  3.  Severe native vessel disease with known occlusion of right coronary artery which was not imaged on the study and newly occluded left main lesion.  4.  LVEDP of 16 mmHg.    The results were reviewed with Dr. Gasper Sells and optimal medical therapy will be pursued.    Diagnostic  Dominance: Right    Echo  12/17/2021:  Sonographer Comments: Image acquisition challenging due to patient body habitus.   IMPRESSIONS      1. Left ventricular ejection fraction, by estimation, is 20 to 25%. The   left ventricle has severely decreased function. The left ventricle   demonstrates regional wall motion abnormalities (see scoring   diagram/findings  for description). The left   ventricular internal cavity size was moderately dilated. Left ventricular   diastolic parameters are indeterminate.    2. Right ventricular systolic function is moderately reduced. The right   ventricular size is normal.    3. Left atrial size was mildly dilated.    4. The mitral valve is grossly normal. Trivial mitral valve   regurgitation. No evidence of mitral stenosis.    5. The aortic valve was not well visualized. Aortic valve regurgitation   is not visualized. No aortic stenosis is present.    6. Aortic dilatation noted. There is mild dilatation of the ascending   aorta, measuring 41 mm.     Comparison(s): Changes from prior study are noted.     Conclusion(s)/Recommendation(s): Wall motion better seen on current  study. Diffuse hypokinesis with akinesis predominantly in the inferoseptal and inferior walls. No evidence of LV thrombus. Severely reduced LVEF, 20-25%.     Echo 10/26/18  1. The left ventricle has mild-moderately reduced systolic function, with an ejection fraction of 40-45%. The cavity size was severely dilated. Left ventricular diastolic Doppler parameters are consistent with pseudonormalization Elevated left ventricular end-diastolic pressure.   2. Poor images preclude accurate assessment of wall motion even with definity.   3. The right ventricle has normal systolic function. The cavity was normal. There is no increase in right ventricular wall thickness.   4. Left atrial size was moderately dilated.   5. The mitral valve is normal in structure. Mitral valve regurgitation is moderate by color flow Doppler.   6. The tricuspid valve is normal in structure.   7. The aortic valve is tricuspid.   8. The pulmonic valve was normal in structure.   9. There is mild dilatation of the ascending aorta measuring 38 mm.  10. Right atrial pressure is estimated at 3 mmHg.    R/LHC 10/26/18  Mid LM to Dist LM lesion is 95% stenosed.  Prox LAD lesion is 100% stenosed.  Prox Cx lesion is 100% stenosed.  Prox RCA to Dist RCA lesion is 100% stenosed.  LV end diastolic pressure is normal.  Hemodynamic findings consistent with mild pulmonary hypertension.    1. Occlusive native vessel CAD.      -95% distal left main     - 100% proximal LAD after the first septal perforator     - 100% LCx after a very small OM1.     - 100% proximal RCA  2. Patent LIMA to the LAD  3. Patent free radial graft arising from the mid LIMA graft and supplying the first diagonal and OM  4. SVG to RCA is known to be occluded. The distal RCA is supplied by collaterals.   5. Normal LV filling pressures  6. Mild pulmonary HTN.  7. Preserved cardiac output.    Plan: continue medical therapy for CHF. Will switch IV lasix to po. Stop IV Ntg and heparin.     EKG:  EKG  is personally reviewed.    05/19/2022:  EKG was not ordered.  01/28/2021: NSR, iLBBB, nonspecific ST changes  08/30/19: sinus bradycardia at 56 bpm, iLBBB. Inferior/lateral ST changes similar location but more pronounced than prior    Recent Labs:  12/17/2021: TSH 1.259  12/24/2021: ALT 16; Hemoglobin 14.1; Platelets 184.0  04/25/2022: BNP 7.3; BUN 43; Creatinine, Ser 2.20; Potassium 5.6; Sodium 135     Recent Lipid Panel    Component Value Date/Time    CHOL 345 (H) 12/17/2021 0118  TRIG 338 (H) 12/17/2021 0118    HDL 46 12/17/2021 0118    CHOLHDL 7.5 12/17/2021 0118    VLDL 68 (H) 12/17/2021 0118    LDLCALC 231 (H) 12/17/2021 0118    LDLDIRECT 66.0 03/24/2021 0859     Physical Exam:      VS:  BP 130/70 (BP Location: Right Arm, Patient Position: Sitting, Cuff Size: Normal)   Pulse 63   Ht 6' (1.829 m)   Wt 248 lb (112.5 kg)   SpO2 97%   BMI 33.63 kg/m       Wt Readings from Last 3 Encounters:   05/19/22 248 lb (112.5 kg)   04/25/22 247 lb (112 kg)   12/28/21 246 lb (111.6 kg)     GEN: Well nourished, well developed in no acute distress  HEENT: Normal, moist mucous membranes  NECK: No JVD  CARDIAC: regular rhythm, normal S1 and S2, no rubs or gallops. No murmur.  VASCULAR: Radial and DP pulses 2+ bilaterally. No carotid bruits  RESPIRATORY:  Clear to auscultation without rales, wheezing or rhonchi   ABDOMEN: Soft, non-tender, non-distended  MUSCULOSKELETAL:  Ambulates independently  SKIN: Warm and dry, 1+ LLE pitting edema (chronic), no significant RLE edema  NEUROLOGIC:  Alert and oriented x 3. No focal neuro deficits noted.  PSYCHIATRIC:  Normal affect     ASSESSMENT:      1. Daytime somnolence    2. Coronary artery disease of native artery of native heart with stable angina pectoris ([place])    3. Hx of CABG    4. Type 2 diabetes mellitus with other circulatory complication, with long-term current use of insulin ([place])    5. Chronic combined systolic and diastolic heart failure ([place])    6. Essential hypertension     7. Hyperlipidemia LDL goal <70      PLAN:      Daytime somnolence:  -denies snoring, but has never been checked for sleep apnea  -concern is that he is tired when he wakes up, not gradually tired as the day goes on. This suggests poor quality sleep  -will order home sleep study today, STOPBANG=5    CAD s/p CABG 2006: known SVG-PDA is occluded, other grafts are patent. No intervenable targets. Severe native disease  -most recent cath 12/2021 as above  -see prior discussion, continuing DAPT indefinitely  -on metoprolol succinate 50 mg daily  -on imdur 120 mg BID, feels better than taking daily  -on ranexa 500 mg BID  -previously on amlodipine as antianginal, not on current med list  -has sublingual nitroglycerin  -counseled on life threatening hypotension with isosorbide, nitroglycerin and sildenafil/erectile dysfunction meds in this class. Would not use PDE5i given this.  -no recent chest pain. Actually slowly increasing his activity without symptoms    Chronic systolic and diastolic heart failure  Ischemic cardiomyopathy  -NYHA class II symptoms today, stable to improving with exercise  -most recent EF 20-25% 12/2021, was off of medications.  -continue metoprolol succinate 50 mg daily  -tolerating entresto 49-51 mg BID  -continue lasix 40 mg as needed. Has CKD stage 3b, previously referred to nephrology, does not appear that this occurred.  -educated on heart failure, including daily weights, sodium and fluid guidelines, signs/symptoms to watch for, diet and activity recommendations  -on empagliflozin (SGLT2i) previously. This was stopped when he was started on semaglutide. He is on only 0.5 mg weekly dose semaglutide. From a cardiac perspective, I would recommend intensifying semaglutide dose and restarting SGLT2i. Last  GFR 35 04/27/22 per outside records. He may need to have his insulin adjusted with these changes, he will discuss with his PCP Dr. Gwyneth Sprout.  -he [place] [facility] and [facility]. His PCP is available in Care  Everywhere. Agree that there is difficulty in coordinating care as he has doctors in different locations.  -discuss ICD at follow up    Hypertension: at goal of <130/80, continue medications as listed    Hyperlipidemia, mixed:  -LDL goal <70. Last LDL 10/2020 80, then LDL 231 12/17/21 (off of medications). Repeat per Care Everywhere 04/27/22 show LDL 87.8. On rosuvastatin 40 mg, on ezetimibe as well.   -would recommend PCSK9i as he is not at goal. Have discussed vascepa as well. Major issue is cost.    Type II diabetes, on insulin and metformin, with chronic kidney disease stage 3. Most recent A1c 9.5 04/27/22  -on Ozempic 0.5 mg weekly, insulin glargine BID  -as above, would recommend intensifying Ozempic and adding SGLT2i, may need adjustment in his insulin with these changes.    Secondary prevention lifestyle recommendations:  -recommend heart healthy/Mediterranean diet, with whole grains, fruits, vegetable, fish, lean meats, nuts, and olive oil. Limit salt.  -recommend moderate walking, 3-5 times/week for 30-50 minutes each session. Aim for at least 150 minutes.week. Goal should be pace of 3 miles/hours, or walking 1.5 miles in 30 minutes  -recommend avoidance of tobacco products. Avoid excess alcohol.    Follow up: 6 months or sooner as needed    Medication Adjustments/Labs and Tests Ordered:  Current medicines are reviewed at length with the patient today.  Concerns regarding medicines are outlined above.     Orders Placed This Encounter   Procedures    [facility] Sleep Study     No orders of the defined types were placed in this encounter.    Patient Instructions   Medication Instructions:   Your Physician recommend you continue on your current medication as directed.      *If you need a refill on your cardiac medications before your next appointment, please call your pharmacy*    Lab Work:  None ordered today    Testing/Procedures:  WatchPAT?  Is a FDA cleared portable home sleep study test that uses a watch and 3  points of contact to monitor 7 different channels, including your heart rate, oxygen saturations, body position, snoring, and chest motion.  The study is easy to use from the comfort of your own home and accurately detect sleep apnea.  Before bed, you attach the chest sensor, attached the sleep apnea bracelet to your nondominant hand, and attach the finger probe.  After the study, the raw data is downloaded from the watch and scored for apnea events.    For more information: [email or web address]    Patient Testing Instructions:   Do not put battery into the device until bedtime when you are ready to begin the test.  Please call the support number if you need assistance after following the instructions below: 24 hour support line- [phone] or [facility] support at [phone] (option 2)   Download the [employer] AmericanWatchPAT One" app through the google play store or App Store   Be sure to turn on or enable access to bluetooth in [place] on your smartphone/ device   Make sure no other bluetooth devices are on and within the vicinity of your smartphone/ device and WatchPAT watch during testing.   Make sure to leave your smart phone/ device plugged in and charging all night.  When ready for bed:   Follow the instructions step by step in the WatchPAT One App to activate the testing device. For additional instructions, including video instruction, visit the WatchPAT One video on Youtube. You can search for [place] One within Youtube (video is 4 minutes and 18 seconds) or enter: https://youtube/watch?[email or web address]  Please note: You will be prompted to enter a Pin to connect via bluetooth when starting the test. The PIN will be assigned to you when you receive the test.   The device is disposable, but it recommended that you retain the device until you receive a call letting you know the study has been received and the results have been interpreted.   We will let you know if the study did not transmit to  [place] properly after the test is completed. You do not need to call us to confirm the receipt of the test.   Please complete the test within 48 hours of receiving PIN.     Frequently Asked Questions:   What is Watch [place] one?   A single use fully disposable home sleep apnea testing device and will not need to be returned after completion.   What are the requirements to use WatchPAT one?   The be able to have a successful watchpat one sleep study, you should have your Watch pat one device, your smart phone, watch pat one app, your PIN number and Internet access  What type of phone do I need?   You should have a smart phone that uses Android 5.1 and above or any Iphone with IOS 10 and above  How can I download the WatchPAT one app?   Based on your device type search for WatchPAT one app either in google play for android devices or APP store for Iphone's  Where will I get my PIN for the study?   Your PIN will be provided by your physician's office. It is used for authentication and if you lose/forget your PIN, please reach out to your providers office.   I do not have Internet at home. Can I do WatchPAT one study?   WatchPAT One needs Internet connection throughout the night to be able to transmit the sleep data. You can use your home/local internet or your cellular's data package. However, it is always recommended to use home/local Internet. It is estimated that between 20MB-30MB will be used with each study.However, the application will be looking for 80MB space in the phone to start the study.   What happens if I lose internet or bluetooth connection?   During the internet disconnection, your phone will not be able to transmit the sleep data. All the data, will be stored in your phone. As soon as the internet connection is back on, the phone will being sending the sleep data.  During the bluetooth disconnection, WatchPAT one will not be able to to send the sleep data to your phone. Data will be kept in the [facility] one  until two devices have bluetooth connection back on. As soon as the connection is back on, WatchPAT one will send the sleep data to the phone.   How long do I need to wear the WatchPAT one?   After you start the study, you should wear the device at least 6 hours.   How far should I keep my phone from the device?   During the night, your phone should be within 15 feet.   What happens if I leave the room for restroom or other  reasons?   Leaving the room for any reason will not cause any problem. As soon as your get back to the room, both devices will reconnect and will continue to send the sleep data.  Can I use my phone during the sleep study?   Yes, you can use your phone as usual during the study. But it is recommended to put your watchpat one on when you are ready to go to bed.   How will I get my study results?   A soon as you completed your study, your sleep data will be sent to the provider. They will then share the results with you when they are ready.     Follow-Up:  At [facility], you and your health needs are our priority.  As part of our continuing mission to provide you with exceptional heart care, we have created designated Provider Care Teams.  These Care Teams include your primary Cardiologist (physician) and Advanced Practice Providers (APPs -  Physician Assistants and Nurse Practitioners) who all work together to provide you with the care you need, when you need it.    We recommend signing up for the patient portal called "MyChart".  Sign up information is provided on this After Visit Summary.  MyChart is used to connect with patients for Virtual Visits (Telemedicine).  Patients are able to view lab/test results, encounter notes, upcoming appointments, etc.  Non-urgent messages can be sent to your provider as well.    To learn more about what you can do with MyChart, go to [email or web address].      Your next appointment:    6 month(s)    The format for your next appointment:    In  Person    Provider:    Buford Dresser, MD          [facility] Stump[place],acting as a scribe for Buford Dresser, MD.,have documented all relevant documentation on the behalf of Buford Dresser, MD,as directed by  Buford Dresser, MD while in the presence of Buford Dresser, MD.    I, Buford Dresser, MD, have reviewed all documentation for this visit. The documentation on 05/19/22 for the exam, diagnosis, procedures, and orders are all accurate and complete.    Signed,  Buford Dresser, MD PhD  05/19/2022    [place]  Electronically signed by Buford Dresser, MD at 06/12/2022  9:38 AM EDT

## 2022-05-20 ENCOUNTER — Telehealth: Payer: Self-pay

## 2022-05-20 ENCOUNTER — Telehealth: Payer: Self-pay | Admitting: *Deleted

## 2022-05-20 NOTE — Telephone Encounter (Signed)
Russell Trevino notified ok to activate itamar device.

## 2022-05-20 NOTE — Telephone Encounter (Signed)
Sent message via mychart informing pt he is ok to activate itamar sleep study.

## 2022-05-25 DIAGNOSIS — E113493 Type 2 diabetes mellitus with severe nonproliferative diabetic retinopathy without macular edema, bilateral: Secondary | ICD-10-CM | POA: Diagnosis not present

## 2022-05-25 DIAGNOSIS — H2513 Age-related nuclear cataract, bilateral: Secondary | ICD-10-CM | POA: Diagnosis not present

## 2022-05-25 DIAGNOSIS — H40013 Open angle with borderline findings, low risk, bilateral: Secondary | ICD-10-CM | POA: Diagnosis not present

## 2022-05-25 LAB — HM DIABETES EYE EXAM

## 2022-06-03 ENCOUNTER — Other Ambulatory Visit (HOSPITAL_COMMUNITY): Payer: Self-pay | Admitting: Urology

## 2022-06-03 DIAGNOSIS — R972 Elevated prostate specific antigen [PSA]: Secondary | ICD-10-CM

## 2022-06-03 DIAGNOSIS — C61 Malignant neoplasm of prostate: Secondary | ICD-10-CM

## 2022-06-06 ENCOUNTER — Encounter: Payer: Self-pay | Admitting: *Deleted

## 2022-06-06 NOTE — Progress Notes (Signed)
Outside notes received. Information abstracted. Notes sent to scan.  

## 2022-06-07 ENCOUNTER — Ambulatory Visit (HOSPITAL_COMMUNITY)
Admission: RE | Admit: 2022-06-07 | Discharge: 2022-06-07 | Disposition: A | Discharge status: Home / Self Care | Payer: Medicare Other | Source: Ambulatory Visit | Attending: Urology | Admitting: Urology

## 2022-06-07 ENCOUNTER — Encounter: Attending: Emergency Medicine | Primary: Emergency Medicine

## 2022-06-07 DIAGNOSIS — R972 Elevated prostate specific antigen [PSA]: Secondary | ICD-10-CM | POA: Insufficient documentation

## 2022-06-07 DIAGNOSIS — C61 Malignant neoplasm of prostate: Secondary | ICD-10-CM | POA: Diagnosis present

## 2022-06-07 MED ORDER — PIFLIFOLASTAT F 18 (PYLARIFY) INJECTION
9.0000 | Freq: Once | INTRAVENOUS | Status: AC
  Administered 2022-06-07: 9.8 via INTRAVENOUS

## 2022-06-17 DIAGNOSIS — E113391 Type 2 diabetes mellitus with moderate nonproliferative diabetic retinopathy without macular edema, right eye: Secondary | ICD-10-CM | POA: Diagnosis not present

## 2022-06-17 DIAGNOSIS — E113492 Type 2 diabetes mellitus with severe nonproliferative diabetic retinopathy without macular edema, left eye: Secondary | ICD-10-CM | POA: Diagnosis not present

## 2022-06-20 ENCOUNTER — Encounter: Payer: MEDICARE | Attending: Emergency Medicine | Primary: Emergency Medicine

## 2022-06-23 ENCOUNTER — Telehealth: Payer: Self-pay

## 2022-06-23 ENCOUNTER — Ambulatory Visit
Admit: 2022-06-23 | Discharge: 2022-06-23 | Payer: MEDICARE | Attending: Emergency Medicine | Primary: Emergency Medicine

## 2022-06-23 DIAGNOSIS — Z0000 Encounter for general adult medical examination without abnormal findings: Secondary | ICD-10-CM

## 2022-06-23 DIAGNOSIS — Z23 Encounter for immunization: Secondary | ICD-10-CM | POA: Diagnosis not present

## 2022-06-23 DIAGNOSIS — E119 Type 2 diabetes mellitus without complications: Secondary | ICD-10-CM | POA: Diagnosis not present

## 2022-06-23 DIAGNOSIS — I25119 Atherosclerotic heart disease of native coronary artery with unspecified angina pectoris: Secondary | ICD-10-CM | POA: Diagnosis not present

## 2022-06-23 DIAGNOSIS — I255 Ischemic cardiomyopathy: Secondary | ICD-10-CM | POA: Diagnosis not present

## 2022-06-23 DIAGNOSIS — I10 Essential (primary) hypertension: Secondary | ICD-10-CM | POA: Diagnosis not present

## 2022-06-23 DIAGNOSIS — E7800 Pure hypercholesterolemia, unspecified: Secondary | ICD-10-CM | POA: Diagnosis not present

## 2022-06-23 DIAGNOSIS — Z7984 Long term (current) use of oral hypoglycemic drugs: Secondary | ICD-10-CM | POA: Diagnosis not present

## 2022-06-23 MED ORDER — ROSUVASTATIN CALCIUM 40 MG PO TABS
40 MG | ORAL_TABLET | ORAL | 3 refills | Status: DC
Start: 2022-06-23 — End: 2023-12-18

## 2022-06-23 MED ORDER — LANTUS SOLOSTAR 100 UNIT/ML SC SOPN
100 UNIT/ML | SUBCUTANEOUS | 3 refills | Status: DC
Start: 2022-06-23 — End: 2023-03-20

## 2022-06-23 MED ORDER — AMLODIPINE BESYLATE 5 MG PO TABS
5 MG | ORAL_TABLET | Freq: Every day | ORAL | 3 refills | Status: AC
Start: 2022-06-23 — End: 2023-07-28

## 2022-06-23 MED ORDER — ENTRESTO 49-51 MG PO TABS
49-51 MG | ORAL_TABLET | ORAL | 3 refills | Status: AC
Start: 2022-06-23 — End: ?

## 2022-06-23 MED ORDER — EZETIMIBE 10 MG PO TABS
10 MG | ORAL_TABLET | ORAL | 3 refills | Status: AC
Start: 2022-06-23 — End: ?

## 2022-06-23 MED ORDER — FAMOTIDINE 40 MG PO TABS
40 MG | ORAL_TABLET | ORAL | 3 refills | Status: AC
Start: 2022-06-23 — End: ?

## 2022-06-23 MED ORDER — NITROGLYCERIN 0.4 MG SL SUBL
0.4 MG | ORAL_TABLET | SUBLINGUAL | 3 refills | Status: DC
Start: 2022-06-23 — End: 2023-12-18

## 2022-06-23 MED ORDER — METOPROLOL SUCCINATE ER 50 MG PO TB24
50 MG | ORAL_TABLET | Freq: Every day | ORAL | 3 refills | Status: AC
Start: 2022-06-23 — End: ?

## 2022-06-23 MED ORDER — FREESTYLE LIBRE 2 SENSOR MISC
3 refills | Status: AC
Start: 2022-06-23 — End: 2022-09-21

## 2022-06-23 MED ORDER — CLOPIDOGREL BISULFATE 75 MG PO TABS
75 MG | ORAL_TABLET | ORAL | 3 refills | Status: DC
Start: 2022-06-23 — End: 2023-12-18

## 2022-06-23 MED ORDER — RANOLAZINE ER 500 MG PO TB12
500 MG | ORAL_TABLET | Freq: Two times a day (BID) | ORAL | 3 refills | Status: AC
Start: 2022-06-23 — End: ?

## 2022-06-23 MED ORDER — ESOMEPRAZOLE MAGNESIUM 40 MG PO CPDR
40 MG | ORAL_CAPSULE | Freq: Every day | ORAL | 3 refills | Status: DC
Start: 2022-06-23 — End: 2022-06-29

## 2022-06-23 MED ORDER — ISOSORBIDE MONONITRATE ER 120 MG PO TB24
120 MG | ORAL_TABLET | Freq: Two times a day (BID) | ORAL | 3 refills | Status: AC
Start: 2022-06-23 — End: ?

## 2022-06-23 MED ORDER — OZEMPIC (0.25 OR 0.5 MG/DOSE) 2 MG/3ML SC SOPN
2 MG/3ML | SUBCUTANEOUS | 3 refills | Status: DC
Start: 2022-06-23 — End: 2022-09-27

## 2022-06-23 MED ORDER — SEMAGLUTIDE (1 MG/DOSE) 4 MG/3ML SC SOPN
4 MG/3ML | SUBCUTANEOUS | 1 refills | Status: DC
Start: 2022-06-23 — End: 2022-09-21

## 2022-06-23 MED ORDER — FREESTYLE LIBRE 2 READER DEVI
Freq: Every day | 0 refills | Status: AC
Start: 2022-06-23 — End: 2022-09-21

## 2022-06-23 MED ORDER — FINASTERIDE 5 MG PO TABS
5 MG | ORAL_TABLET | ORAL | 3 refills | Status: DC
Start: 2022-06-23 — End: 2023-12-18

## 2022-06-23 MED ORDER — BD PEN NEEDLE MINI U/F 31G X 5 MM MISC
3 refills | Status: AC
Start: 2022-06-23 — End: ?

## 2022-06-23 MED ORDER — FUROSEMIDE 40 MG PO TABS
40 MG | ORAL_TABLET | Freq: Every day | ORAL | 3 refills | Status: AC
Start: 2022-06-23 — End: ?

## 2022-06-23 NOTE — Telephone Encounter (Signed)
Erroneous entry

## 2022-06-23 NOTE — Telephone Encounter (Deleted)
This is Russell Trevino.

## 2022-06-23 NOTE — Patient Instructions (Signed)
9 Ways to Cut Back on Drinking  Maybe you've found yourself drinking more alcohol than you'd prefer. If you want to cut back, here are some ideas to try.    Think before you drink.  Do you really want a drink, or is it just a habit? If you're used to having a drink at a certain time, try doing something else then.     Look for substitutes.  Find some no-alcohol drinks that you enjoy, like flavored seltzer water, tea with honey, or tonic with a slice of lime. Or try alcohol-free beer or "virgin" cocktails (without the alcohol).     Drink more water.  Use water to quench your thirst. Drink a glass of water before you have any alcohol. Have another glass along with every drink or between drinks.     Shrink your drink.  For example, have a bottle of beer instead of a pint. Use a smaller glass for wine. Choose drinks with lower alcohol content (ABV%). Or use less liquor and more mixer in cocktails.     Slow down.  It's easy to drink quickly and without thinking about it. Pay attention, and make each drink last longer.     Do the math.  Total up how much you spend on alcohol each month. How much is that a year? If you cut back, what could you do with the money you save?     Take a break.  Choose a day or two each week when you won't drink at all. Notice how you feel on those days, physically and emotionally. How did you sleep? Do you feel better? Over time, add more break days.     Count calories.  Would you like to lose some weight? That can be a good motivator for cutting back. Figure out how many calories are in each drink. How many does that add up to in a day? In a week? In a month?     Practice saying no.  Be ready when someone offers you a drink. Try: "Thanks, I've had enough." Or "Thanks, but I'm cutting back." Or "No, thanks. I feel better when I drink less."   Current as of: March 21, 2023Content Version: 13.8   2006-2023 Healthwise, Incorporated.   Care instructions adapted under license by  [facility]. If you have questions about a medical condition or this instruction, always ask your healthcare professional. [place] any warranty or liability for your use of this information.         Advance Directives: Care Instructions  Overview  An advance directive is a legal way to state your wishes at the end of your life. It tells your family and your doctor what to do if you can't say what you want.  There are two main types of advance directives. You can change them any time your wishes change.  Living will.  This form tells your family and your doctor your wishes about life support and other treatment. The form is also called a declaration.  Medical power of attorney.  This form lets you name a person to make treatment decisions for you when you can't speak for yourself. This person is called a health care agent (health care proxy, health care surrogate). The form is also called a durable power of attorney for health care.  If you do not have an advance directive, decisions about your medical care may be made by a family member, or by a doctor or a  judge who doesn't know you.  It may help to think of an advance directive as a gift to the people who care for you. If you have one, they won't have to make tough decisions by themselves.  For more information, including forms for your state, see the [place] website ([employer]banking[employer].hu).  Follow-up care is a key part of your treatment and safety. Be sure to make and go to all appointments, and call your doctor if you are having problems. It's also a good idea to know your test results and keep a list of the medicines you take.  What should you include in an advance directive?  Many states have a unique advance directive form. (It may ask you to address specific issues.) Or you might use a universal form that's approved by many states.  If your form doesn't tell you what to address, it may be hard to know  what to include in your advance directive. Use the questions below to help you get started.  Who do you want to make decisions about your medical care if you are not able to?  What life-support measures do you want if you have a serious illness that gets worse over time or can't be cured?  What are you most afraid of that might happen? (Maybe you're afraid of having pain, losing your independence, or being kept alive by machines.)  Where would you prefer to die? (Your home? A hospital? A nursing home?)  Do you want to donate your organs when you die?  Do you want certain religious practices performed before you die?  When should you call for help?  Be sure to contact your doctor if you have any questions.  Where can you learn more?  Go to https://www.bennett.info/ and enter R264 to learn more about "Advance Directives: Care Instructions."  Current as of: March 26, 2023Content Version: 13.8   2006-2023 Healthwise, Incorporated.   Care instructions adapted under license by [facility]. If you have questions about a medical condition or this instruction, always ask your healthcare professional. [place] any warranty or liability for your use of this information.           Starting a Weight Loss Plan: Care Instructions  Overview     If you're thinking about losing weight, it can be hard to know where to start. Your doctor can help you set up a weight loss plan that best meets your needs. You may want to take a class on nutrition or exercise, or you could join a weight loss support group. If you have questions about how to make changes to your eating or exercise habits, ask your doctor about seeing a registered dietitian or an exercise specialist.  It can be a big challenge to lose weight. But you don't have to make huge changes at once. Make small changes, and stick with them. When those changes become habit, add a few more changes.  If you don't think you're ready  to make changes right now, try to pick a date in the future. Make an appointment to see your doctor to discuss whether the time is right for you to start a plan.  Follow-up care is a key part of your treatment and safety. Be sure to make and go to all appointments, and call your doctor if you are having problems. It's also a good idea to know your test results and keep a list of the medicines you take.  How can you care for  yourself at home?  Set realistic goals. Many people expect to lose much more weight than is likely. A weight loss of 5% to 10% of your body weight may be enough to improve your health.  Get family and friends involved to provide support. Talk to them about why you are trying to lose weight, and ask them to help. They can help by participating in exercise and having meals with you, even if they may be eating something different.  Find what works best for you. If you do not have time or do not like to cook, a program that offers meal replacement bars or shakes may be better for you. Or if you like to prepare meals, finding a plan that includes daily menus and recipes may be best.  Ask your doctor about other health professionals who can help you achieve your weight loss goals.  A dietitian can help you make healthy changes in your diet.  An exercise specialist or personal trainer can help you develop a safe and effective exercise program.  A counselor or psychiatrist can help you cope with issues such as depression, anxiety, or family problems that can make it hard to focus on weight loss.  Consider joining a support group for people who are trying to lose weight. Your doctor can suggest groups in your area.  Where can you learn more?  Go to https://www.bennett.info/ and enter U357 to learn more about "Starting a Weight Loss Plan: Care Instructions."  Current as of: February 28, 2023Content Version: 13.8   2006-2023 Healthwise, Incorporated.   Care instructions adapted  under license by [facility]. If you have questions about a medical condition or this instruction, always ask your healthcare professional. [place] any warranty or liability for your use of this information.           A Healthy Heart: Care Instructions  Your Care Instructions     Coronary artery disease, also called heart disease, occurs when a substance called plaque builds up in the vessels that supply oxygen-rich blood to your heart muscle. This can narrow the blood vessels and reduce blood flow. A heart attack happens when blood flow is completely blocked. A high-fat diet, smoking, and other factors increase the risk of heart disease.  Your doctor has found that you have a chance of having heart disease. You can do lots of things to keep your heart healthy. It may not be easy, but you can change your diet, exercise more, and quit smoking. These steps really work to lower your chance of heart disease.  Follow-up care is a key part of your treatment and safety. Be sure to make and go to all appointments, and call your doctor if you are having problems. It's also a good idea to know your test results and keep a list of the medicines you take.  How can you care for yourself at home?  Diet   Use less salt when you cook and eat. This helps lower your blood pressure. Taste food before salting. Add only a little salt when you think you need it. With time, your taste buds will adjust to less salt.    Eat fewer snack items, fast foods, canned soups, and other high-salt, high-fat, processed foods.    Read food labels and try to avoid saturated and trans fats. They increase your risk of heart disease by raising cholesterol levels.    Limit the amount of solid fat-butter, margarine, and shortening-you eat. Use olive, peanut,  or canola oil when you cook. Bake, broil, and steam foods instead of frying them.    Eat a variety of fruit and vegetables every day. Dark green, deep orange, red, or  yellow fruits and vegetables are especially good for you. Examples include spinach, carrots, peaches, and berries.    Foods high in fiber can reduce your cholesterol and provide important vitamins and minerals. High-fiber foods include whole-grain cereals and breads, oatmeal, beans, brown rice, citrus fruits, and apples.    Eat lean proteins. Heart-healthy proteins include seafood, lean meats and poultry, eggs, beans, peas, nuts, seeds, and soy products.    Limit drinks and foods with added sugar. These include candy, desserts, and soda pop.   Lifestyle changes   If your doctor recommends it, get more exercise. Walking is a good choice. Bit by bit, increase the amount you walk every day. Try for at least 30 minutes on most days of the week. You also may want to swim, bike, or do other activities.    Do not smoke. If you need help quitting, talk to your doctor about stop-smoking programs and medicines. These can increase your chances of quitting for good. Quitting smoking may be the most important step you can take to protect your heart. It is never too late to quit.    Limit alcohol to 2 drinks a day for men and 1 drink a day for women. Too much alcohol can cause health problems.    Manage other health problems such as diabetes, high blood pressure, and high cholesterol. If you think you may have a problem with alcohol or drug use, talk to your doctor.   Medicines   Take your medicines exactly as prescribed. Call your doctor if you think you are having a problem with your medicine.    If your doctor recommends aspirin, take the amount directed each day. Make sure you take aspirin and not another kind of pain reliever, such as acetaminophen (Tylenol).   When should you call for help?   Call 911 if you have symptoms of a heart attack. These may include:   Chest pain or pressure, or a strange feeling in the chest.    Sweating.    Shortness of breath.    Pain, pressure, or a strange feeling in the back,  neck, jaw, or upper belly or in one or both shoulders or arms.    Lightheadedness or sudden weakness.    A fast or irregular heartbeat.   After you call 911, the operator may tell you to chew 1 adult-strength or 2 to 4 low-dose aspirin. Wait for an ambulance. Do not try to drive yourself.  Watch closely for changes in your health, and be sure to contact your doctor if you have any problems.  Where can you learn more?  Go to https://www.bennett.info/ and enter F075 to learn more about "A Healthy Heart: Care Instructions."  Current as of: June 25, 2023Content Version: 13.8   2006-2023 Healthwise, Incorporated.   Care instructions adapted under license by [facility]. If you have questions about a medical condition or this instruction, always ask your healthcare professional. [place] any warranty or liability for your use of this information.      Personalized Preventive Plan for Alfred Weiss - 06/23/2022  Medicare offers a range of preventive health benefits. Some of the tests and screenings are paid in full while other may be subject to a deductible, co-insurance, and/or copay.    Some of these  benefits include a comprehensive review of your medical history including lifestyle, illnesses that may run in your family, and various assessments and screenings as appropriate.    After reviewing your medical record and screening and assessments performed today your provider may have ordered immunizations, labs, imaging, and/or referrals for you.  A list of these orders (if applicable) as well as your Preventive Care list are included within your After Visit Summary for your review.    Other Preventive Recommendations:    A preventive eye exam performed by an eye specialist is recommended every 1-2 years to screen for glaucoma; cataracts, macular degeneration, and other eye disorders.  A preventive dental visit is recommended every 6 months.  Try to get at least 150  minutes of exercise per week or 10,000 steps per day on a pedometer .  Order or download the FREE "Exercise & Physical Activity: Your Everyday Guide" from [employer]. Call 4161029634 or search [employer] online.  You need 1200-1500 mg of calcium and 1000-2000 IU of vitamin D per day. It is possible to meet your calcium requirement with diet alone, but a vitamin D supplement is usually necessary to meet this goal.  When exposed to the sun, use a sunscreen that protects against both UVA and UVB radiation with an SPF of 30 or greater. Reapply every 2 to 3 hours or after sweating, drying off with a towel, or swimming.  Always wear a seat belt when traveling in a car. Always wear a helmet when riding a bicycle or motorcycle.

## 2022-06-23 NOTE — Progress Notes (Signed)
Medicare Annual Wellness Visit    Alfred Weiss is here for Medicare AWV (F/U Medical Conditions/)    Assessment & Plan   Recommendations for Preventive Services Due: see orders and patient instructions/AVS.  Recommended screening schedule for the next 5-10 years is provided to the patient in written form: see Patient Instructions/AVS.     Return in about 3 months (around 09/23/2022) for Follow-up with labs, Physical with labs near.     Subjective   HPI:   Symptom(s):   Alfred Weiss  is a pleasant  71 y.o. year-old patient who reestablished 04/27/2022 and had been seeing a physician up in [place] intermittently as well as cardiology.  His girlfriend suffers end-stage lung cancer and has a 72 year old but no other family support.  Patient also spent 3 months in the [place].  Patient has a history of Cardiomyopathy, CAD, poorly controlled diabetes/morbid obesity, renal failure, hypertension, mixed hyperlipidemia, prostate cancer Dx 2014 opted for active surveillance who presents for an [place] exam with labs.    Labs revealed renal function higher at 2.0, hemoglobin A1c moved/not at goal 9.7 (11.7), LDL 88, bicarbonate 20 with potassium of 5.5 and uric acid of 7.5 (6.1).  Patient denies any hospitalizations or ER visits since last office visit. Specialists since the last visit include: Active surveillance for prostate cancer [facility] urologist repeat biopsy positive for cancer (no records) contemplating radiation/surgery.  Regarding CAD patient had four-vessel CABG in 2006 with most recent Williams 10/2018 multivessel disease treated medically remains on Entresto, Ranexa, isosorbide and antihypertensive meds.  No chest pain, shortness of breath or syncope.  Offered local cardiology as plans to settle here permanently once girlfriend passes away.  Declines nephrology visit/referral.  Declines local urologist at this time.  Previously insurance denied [place] system and we will reattempt.  Patient  takes Lantus 30 units twice a day, oral metformin and is currently on Ozempic 0.5 mg/week.  He did not bring in his meter.  I recommend all his specialists/PCP in 1 location.  Overdue tests/specialists: Labs, endocrine, nephrology  Additional chronic medical conditions managed include: GERD/gastric bezoar 2021-Dr. Roanna Raider lost to follow-up, OA/right sciatica.  Normal follow-up every 3 months with labs.  Specialist(s):  [facility] cardiology referral pending (Dr. Tamsen Roers, [place])  Dr. Charmaine Downs to follow-up history of gastric bezoar/ positive Cologuard 02/26/2021-colonoscopy not accomplished, EGD 12/16/2019 showed irregular Z-line large gastric bezoar-lost to follow-up  Neurologist-previous intention tremor felt to be related to Metoclopramide medicine discontinued tremor persists    Patient's complete Health Risk Assessment and screening values have been reviewed and are found in Flowsheets. The following problems were reviewed today and where indicated follow up appointments were made and/or referrals ordered.    Positive Risk Factor Screenings with Interventions:         Alcohol Screening:  Alcohol Use: Heavy Drinker (06/21/2022)    AUDIT-C     Frequency of Alcohol Consumption: 4 or more times a week     Average Number of Drinks: 1 or 2     Frequency of Binge Drinking: Less than monthly      AUDIT-C Score: 5   AUDIT Total Score: 5    Interpretation of AUDIT-C score:   3-7 indicates potential alcohol risk.    8 or more is associated with harmful or hazardous drinking.   17 or more in women, and 15 or more in men, is likely to indicate alcohol dependence.  Interventions:  Patient declined any further intervention or treatment  Weight and Activity:  Physical Activity: Insufficiently Active (06/21/2022)    Exercise Vital Sign     Days of Exercise per Week: 3 days     Minutes of Exercise per Session: 20 min     On average, how many days per week do you engage in moderate to strenuous exercise  (like a brisk walk)?: 3 days  Have you lost any weight without trying in the past 3 months?: No  Body mass index is 32.41 kg/m. (!) Abnormal  Obesity Interventions:  Patient declines any further evaluation or treatment               Advanced Directives:  Do you have a Living Will?: (!) No    Intervention:                 General/Constitutional:   Patient denies fever, shakes or chills, excessive weight gain or weight loss.  Comments highest weight 10/2019 259 pounds, lowest 239 (current weight) (260 pounds-02/2021)  Endocrine:   Patient denies excessive thirst, hunger or frequent urination.   Respiratory:   Patient denies cough, shortness of breath, wheezing or hemoptysis.   Cardiovascular:   Patient denies chest pain, palpatations or syncope.   Gastrointestinal:   Patient denies abdominal pain, nausea, vomiting or diarrhea, blood in stool.   Men Only:  Comments active surveillance for prostate cancer planning repeat biopsy by history  Musculoskeletal:   Patient denies leg swelling, painful or swollen joints.   Skin:   Patient denies rash.   Neurologic:   Patient denies dizziness, loss of use of extremity or focal neurologic deficit.   Psychiatric:   Patient denies anxiety, depression or suicidal thoughts.         Objective   Vitals:    06/23/22 1100   BP: 138/74   Site: Left Upper Arm   Position: Sitting   Cuff Size: Large Adult   Pulse: 66   Resp: 16   Temp: 98.4 F (36.9 C)   TempSrc: Temporal   SpO2: 99%   Weight: 239 lb (108.4 kg)   Height: 6' (1.829 m)      Body mass index is 32.41 kg/m.        Physical Examination:   GENERAL APPEARANCE: well developed and well nourished in NAD.   EYES: PERRLA, EOMI conjunctiva clear and sclera nonicteric.   EARS: canals clear and TM's are normal.   NOSE: Bilateral nares are patent with normal inferior turbinates without drainage, erythema or masses.   THROAT: MMM, pharynx normal without erythema or exudate.   NECK: supple with FROM and no thyromegaly or mass, -carotid  bruit or thrill .   HEART: RRR without murmurs, clicks or rubs.   LUNGS: CTA in all fields without rales, whezes or rubs.   ABDOMEN: soft with positive BS, nontender, no hepatosplenomegaly, no guarding or rebound and no masses or hernias.   MALE GENITOURINARY: not examined.   RECTAL EXAM: not examined.   SKIN: warm and dry without rash or suspicious lesions.   EXTREMITIES: FROM without clubbing, cyanosis or edema.   BACK: FROM, no point tenderness, no bony stepoff or crepitus.   VASCULAR: 2+ bilateral radial pulses, 2+ dorsalis pedis and posterior tibial pulses bilaterally .   NEUROLOGIC: CN II - XII normal, motor strength 5/5 throughout and sensory exam intact.   PSYCH: cognitive function intact, judgement and insight good, mood/affect full range.       No Known Allergies  Prior to Visit Medications  Medication Sig Taking? Authorizing Provider   cyclobenzaprine (FLEXERIL) 10 MG tablet  Yes [provider]   nitroGLYCERIN (NITROSTAT) 0.4 MG SL tablet PLACE 1 TABLET UNDER THE TONGUE EVERY 5 MINS FOR CHEST FOR PAIN Yes Karen Kitchens, MD   amLODIPine (NORVASC) 5 MG tablet Take 1 tablet by mouth daily Yes Karen Kitchens, MD   ezetimibe (ZETIA) 10 MG tablet 1 tablet Orally Once a day Yes Karen Kitchens, MD   rosuvastatin (CRESTOR) 40 MG tablet 1 tablet Orally Once a day Yes Karen Kitchens, MD   sacubitril-valsartan (ENTRESTO) 49-51 MG per tablet 1 tablet Orally Twice a day Yes Karen Kitchens, MD   famotidine (PEPCID) 40 MG tablet 1 tablet at bedtime Orally Once a day for 90 days Yes Karen Kitchens, MD   esomeprazole (NEXIUM) 40 MG delayed release capsule Take 1 capsule by mouth every morning (before breakfast) Yes Karen Kitchens, MD   clopidogrel (PLAVIX) 75 MG tablet 1 tablet Orally Once per day for 90 days Yes Karen Kitchens, MD   finasteride (PROSCAR) 5 MG tablet 1 tablet Orally Once per day at 12 noon for 90 days Yes Karen Kitchens, MD   furosemide (LASIX) 40 MG tablet Take 1 tablet by mouth daily Yes Karen Kitchens, MD   isosorbide  mononitrate (IMDUR) 120 MG extended release tablet Take 1 tablet by mouth 2 times daily Yes Karen Kitchens, MD   LANTUS SOLOSTAR 100 UNIT/ML injection pen ADMINISTER 30 UNITS UNDER THE SKIN TWICE DAILY Yes Karen Kitchens, MD   metoprolol succinate (TOPROL XL) 50 MG extended release tablet Take 1 tablet by mouth daily At noon Yes Karen Kitchens, MD   ranolazine (RANEXA) 500 MG extended release tablet Take 1 tablet by mouth 2 times daily Yes Karen Kitchens, MD   B-D UF III MINI PEN NEEDLES 31G X 5 MM MISC USE AS DIRECTED FOR INSULIN PEN Yes [provider]   ASPIRIN LOW DOSE 81 MG EC tablet  Yes [provider]       CareTeam (Including outside providers/suppliers regularly involved in providing care):   Patient Care Team:  Karen Kitchens, MD as PCP - General  Karen Kitchens, MD as PCP - Empaneled Provider     Reviewed and updated this visit:  Tobacco  Allergies  Meds  Problems  Med Hx  Surg Hx  Soc Hx  Fam Hx           Time documentation as follows: 10 minutes reviewing old records, labs and previous visit note, 15 minutes discussing history of present illness/complaint as well as discussing treatment and plan, 5 minutes performing pertinent exam, 5 minutes documenting chart for total time of 35 minutes time required for SAWV

## 2022-06-24 NOTE — Progress Notes (Signed)
I called pt to introduce myself as the Prostate Nurse Navigator and the Coordinator of the [facility]state [place].   1. I confirmed with the patient he is aware of his referral to the clinic 10/24, arriving @ 12:30 pm.    2. I discussed the format of the clinic and the physicians he will be seeing that day.   3. I discussed where the clinic is located and how to contact me.   4. I confirmed his address and informed him I would be mailing a packet of information and forms to be completed. I asked him to bring them with him the day of his appointment.    He voiced understanding of the above. I asked him to call me if he has any questions or concerns regarding his appointments or the forms he needs to complete.

## 2022-06-29 ENCOUNTER — Encounter

## 2022-06-29 MED ORDER — PANTOPRAZOLE SODIUM 40 MG PO TBEC
40 MG | ORAL_TABLET | Freq: Every day | ORAL | 3 refills | Status: AC
Start: 2022-06-29 — End: 2023-10-16

## 2022-06-29 NOTE — Progress Notes (Signed)
06/29/2022 warning esomeprazole may decrease Plavix.  Recommend switching to Protonix

## 2022-06-30 NOTE — Telephone Encounter (Signed)
Left message for patient re: change in medication from Esomeprazole to Protonix due to protential interaction between esomeprazole and Plavix.

## 2022-07-04 ENCOUNTER — Telehealth: Payer: Self-pay

## 2022-07-04 NOTE — Telephone Encounter (Signed)
Spoke with patient to confirm appointment for 10/24 and remind patient to bring paperwork, patient informed me he never received the paperwork, I advised him he could just fill it out here when he got arrived

## 2022-07-05 ENCOUNTER — Encounter: Payer: Self-pay | Admitting: *Deleted

## 2022-07-05 ENCOUNTER — Inpatient Hospital Stay: Payer: Medicare Other | Attending: Oncology | Admitting: Oncology

## 2022-07-05 ENCOUNTER — Inpatient Hospital Stay (HOSPITAL_BASED_OUTPATIENT_CLINIC_OR_DEPARTMENT_OTHER): Payer: Medicare Other | Admitting: Genetic Counselor

## 2022-07-05 ENCOUNTER — Other Ambulatory Visit: Payer: Self-pay

## 2022-07-05 ENCOUNTER — Ambulatory Visit
Admission: RE | Admit: 2022-07-05 | Discharge: 2022-07-05 | Disposition: A | Discharge status: Home / Self Care | Payer: Medicare Other | Source: Ambulatory Visit | Attending: Radiation Oncology | Admitting: Radiation Oncology

## 2022-07-05 VITALS — BP 146/77 | HR 68 | Temp 97.5°F | Resp 18 | Ht 72.0 in | Wt 235.6 lb

## 2022-07-05 DIAGNOSIS — Z191 Hormone sensitive malignancy status: Secondary | ICD-10-CM | POA: Diagnosis not present

## 2022-07-05 DIAGNOSIS — C61 Malignant neoplasm of prostate: Secondary | ICD-10-CM

## 2022-07-05 DIAGNOSIS — E785 Hyperlipidemia, unspecified: Secondary | ICD-10-CM | POA: Insufficient documentation

## 2022-07-05 DIAGNOSIS — Z801 Family history of malignant neoplasm of trachea, bronchus and lung: Secondary | ICD-10-CM | POA: Diagnosis not present

## 2022-07-05 DIAGNOSIS — K219 Gastro-esophageal reflux disease without esophagitis: Secondary | ICD-10-CM | POA: Insufficient documentation

## 2022-07-05 DIAGNOSIS — E119 Type 2 diabetes mellitus without complications: Secondary | ICD-10-CM | POA: Insufficient documentation

## 2022-07-05 DIAGNOSIS — N400 Enlarged prostate without lower urinary tract symptoms: Secondary | ICD-10-CM | POA: Diagnosis not present

## 2022-07-05 DIAGNOSIS — Z794 Long term (current) use of insulin: Secondary | ICD-10-CM | POA: Insufficient documentation

## 2022-07-05 DIAGNOSIS — I255 Ischemic cardiomyopathy: Secondary | ICD-10-CM | POA: Insufficient documentation

## 2022-07-05 DIAGNOSIS — I2510 Atherosclerotic heart disease of native coronary artery without angina pectoris: Secondary | ICD-10-CM | POA: Insufficient documentation

## 2022-07-05 DIAGNOSIS — Z7902 Long term (current) use of antithrombotics/antiplatelets: Secondary | ICD-10-CM | POA: Insufficient documentation

## 2022-07-05 DIAGNOSIS — I252 Old myocardial infarction: Secondary | ICD-10-CM | POA: Insufficient documentation

## 2022-07-05 DIAGNOSIS — Z79899 Other long term (current) drug therapy: Secondary | ICD-10-CM | POA: Insufficient documentation

## 2022-07-05 DIAGNOSIS — Z951 Presence of aortocoronary bypass graft: Secondary | ICD-10-CM | POA: Insufficient documentation

## 2022-07-05 NOTE — Consult Note (Signed)
[facility]     07/05/2022   --------------------------------------------------------------------------------   Russell Trevino  MRN: [id number]  DOB: 1951-04-19, 71 year old Male  SSN: [id number]   PRIMARY CARE:  Billey Gosling, MD  REFERRING:  Irine Seal, MD  PROVIDER:  Irine Seal, M.D.  TREATING:  Raynelle Bring, M.D.  LOCATION:  [facility] [phone]     --------------------------------------------------------------------------------   CC/HPI: CC: Prostate Cancer   Physician requesting consult: Dr. Irine Seal  PCP: Dr. Billey Gosling  Location of consult: [facility] - Prostate Cancer Multidisciplinary [facility]   Russell Trevino is a 71 year old gentleman who was initially diagnosed with low risk prostate cancer in February 2014 when he was found to have a PSA of 10.3 with 3 out of 14 biopsy cores positive for Gleason 3+3=6 adenocarcinoma. He was started on 5 ARI after that biopsy with an appropriate drop in his PSA. A repeat surveillance biopsy in November 2015 indicated no progression. He was then managed clinically without evidence of progression. He did not follow up from 2018 until 2022. His PSA was 4.42 in May 2022. At that time, he was recommended to undergo an MRI of the prostate but deferred this until 2023. His PSA in June 2023 had increased to 9.65. An MRI on 03/28/22 indicated a 1.8 cm PI-RADS 5 lesion at the right mid gland and a 1.3 cm PI-RADS 4 lesion at the right apex. An MR/US fusion biopsy on 05/17/22 demonstrated upgraded Gleason 4+5=9 (GG 5) with 4 out of 12 systematic biopsies positive for malignancy and 6 out of 6 targeted biopsies positive.   Family history: None.   Imaging studies: PSMA PET scan (06/07/22): Negative for metastatic disease.   PMH: He has a history of diabetes, CAD s/p MI (on Plavix), CHF with ischemic cardiomyopathy (EF 45-50%), hypertension, and hyperlipidemia.  PSH: CABG. No abdominal surgeries. He did have a  left flank incision for what sounds like a kidney stone as a 71 year old many years ago. His cardiologist is Dr. Harrell Gave with [facility].   TNM stage: cT2a N0 M0  PSA: 9.65 (19.2 corrected for 5 ARI)  Gleason score: 4+5=9( GG 5)  Biopsy (05/17/22): 10/18 cores positive  Left: Benign  Right: R apex (40%, 4+3=7, PNI), R lateral apex (20%, 3+3=6), R mid (30%, 3+4=7, PNI), R lateral mid (40%, 4+4=8)  ROI: 6/6 cores ( ROI-1 3/3 cores 70%, 4+5=9, 50%, 4+4=8, 80%, 4+4=8), (ROI-2 - 3/3 cores 60%, 50%, 50% 4+3=7, PNI)  Prostate volume: 243 cc   Nomogram  OC disease: 12%  EPE: 86%  SVI: 35%  LNI: 37%  PFS (5 year, 10 year): 30%, 19%   Urinary function: IPSS is 12. He is treated with finasteride.  Erectile function: SHIM score is 10. He can obtain erections but does need to use a penile constriction band to maintain his erection. He does not use oral medical therapy.     ALLERGIES: No Allergies    MEDICATIONS: Finasteride 5 mg tablet 1 tablet PO Daily  Lisinopril  Metoprolol Succinate 50 mg tablet, extended release 24 hr 1 tablet PO Daily  Aspir 81  Atorvastatin Calcium  Carvedilol 12.5 mg tablet Oral  Clopidogrel 75 mg tablet 1 tablet PO Daily  Diazepam 10 mg tablet 1-2 tablet PO Daily Take one hour prior to procedure.  Entresto 49 mg-51 mg tablet 1 tablet PO Daily  Humalog Mix 75-25 Kwikpen 100 unit/ml (75-25) insulin pen Subcutaneous  Levofloxacin 750 mg  tablet 1 po 1 hour prior to the procedure  Nystatin 100,000 unit/gram cream apply sparingly to rash 2-3x daily  Oxycodone-Acetaminophen 2.5 mg-300 mg tablet Oral  Ozempic  Zetia 10 mg tablet 1 tablet PO Daily     GU PSH: Cysto Remove Stent FB Com - 2011 Cysto Remove Stent FB Sim - 2010 Cystoscopy - 2018 Cystoscopy Fulguration - 2014 Cystoscopy Insert Stent - 2011 Cystoscopy Irrigate Clot - 2014 Cystoscopy TURP - 2011 Cystoscopy Ureteroscopy - 2010 ESWL - 2015, 2011 Prostate Needle Biopsy - 05/17/2022,  2014 Remove Kidney Stone - 2010       PSH Notes: Lithotripsy, Biopsy Of The Prostate Needle, Transurethral Fulguration For Post-op Bleeding, Cystourethroscopy With Irrigation And Evacuation Of Clots, Transurethral Resection Of Prostate (TURP), Cystoscopy With Complicated Removal Of Object, Lithotripsy, Cystoscopy With Insertion Of Ureteral Stent Right, Cystoscopy With Removal Of Object, Cystoscopy With Ureteroscopy, Lithotomy, Coronary Artery Bypass Graft (CABG), Back Surgery 2009   NON-GU PSH: CABG (coronary artery bypass grafting) - 2010 Surgical Pathology, Gross And Microscopic Examination For Prostate Needle - 05/17/2022     GU PMH: BPH w/LUTS, He is voiding well on finasteride. - 06/13/2022, He is voiding well on finasteride and has had no further hematuria. , - 2018, Benign prostatic hyperplasia with urinary obstruction, - 2016 ED due to arterial insufficiency, He has limited erectile function and is not a candidate for PDE5's. I explained the negative impact on erections that are likely with either of the treatment options. - 06/13/2022, Erectile dysfunction due to arterial insufficiency, - 2016 Nocturia - 06/13/2022, - 03/07/2022, He is voiding well on the finasteride. , - 01/29/2021, - 2018 Prostate Cancer, Emet had a T2a N0 M0 high risk prostate cancer with a 249m prostate on finasteride. He has DM and CAD with CHF. He is not a good candidate for surveillance and his prostate is too large for seeds, cryo or HIFU. I discussed the risks of RALP as well as the risks of EXRT and 156mof ADT in detail as well. I explained that for men over 7030particularly with comorbities, I would generally recommend EXRT with ADT for high risk disease but with his large prostate, surgery might be worth considering. I [facility] goint [facility]. If he elects EXRT with ADT, I will get him back in for FiMolinond I reviewed the side effects specific to that  option. I reviewed the use of SpaceOAR and fiducials and their associated risks and benefits. - 06/13/2022, - 05/17/2022, - 03/07/2022, He is overdue for a PSA and suveillance biopsy. He has a right apical nodule. I will get a PSA today and get him set up for a prostate MRI and then will consider a biopsy based on those results. If the MRI is negative he will need a standard biopsy., - 01/29/2021, - 2018, Prostate cancer, - 2016 Elevated PSA - 05/17/2022, He is overdue for a PSA and I will consider an MRIP based on those results. He will have a repeat PSA in a year. , - 2018 Prostate nodule w/ LUTS - 05/17/2022, - 03/07/2022, - 01/29/2021 Renal calculus (Stable), He has had no symptoms from his stones. - 01/29/2021, (Stable), - 2018, Bilateral kidney stones, - 2015 Gross hematuria - 2018, Gross Hematuria, - 2014 Other dorsalgia - 2018 Weak Urinary Stream, Weak urinary stream - 2016 Other microscopic hematuria, Microscopic hematuria - 2015 Ureteral calculus, Calculus of left ureter - 2015, Distal  Ureteral Stone On The Right, - 2014 History of urolithiasis, Nephrolithiasis - 2014 Personal Hx Oth male genital organs diseases, History of epididymitis - 2014 RUQ pain, Abdominal Pain In The Right Upper Belly (RUQ) - 2014 Urge incontinence, Urge Incontinence Of Urine - 2014 Urinary Retention, Unspec, Acute Urinary Retention - 2014 Urinary Tract Inf, Unspec site, Pyuria - 2014      PMH Notes:  2009-05-25 12:49:44 - Note: Coronary Artery Disease  2010-01-18 10:03:18 - Note: Bladder Calculus  2010-01-04 14:09:37 - Note: Nephrolithiasis Of The Right Kidney   NON-GU PMH: Encounter for general adult medical examination without abnormal findings, Encounter for preventive health examination - 2015 Foreign body in bladder, initial encounter, Foreign body in bladder - 2014 Personal history of other endocrine, nutritional and metabolic disease, History of diabetes mellitus - 2014 Personal history of other specified  conditions, History of nausea and vomiting - 2014 Diabetes Type 2 Heart disease, unspecified Hypercholesterolemia Myocardial Infarction    FAMILY HISTORY: 1 son - Other 3 daughters - Other Cancer - Mother Death In The Family Father - Runs In Family Diabetes - Mother, Father Family Health Status Number - Runs In Family nephrolithiasis - Father renal failure - Mother   SOCIAL HISTORY: Marital Status: Single Preferred Language: English; Race: White Current Smoking Status: Patient has never smoked.   Tobacco Use Assessment Completed: Used Tobacco in last 30 days? Drinks 3 caffeinated drinks per day.     Notes: Occupation:, Previous History Of Smoking, Being A Social Drinker, Caffeine Use, Marital History - Currently Married   REVIEW OF SYSTEMS:    GU Review Male:   Patient denies frequent urination, hard to postpone urination, burning/ pain with urination, get up at night to urinate, leakage of urine, stream starts and stops, trouble starting your streams, and have to strain to urinate .  Gastrointestinal (Lower):   Patient denies diarrhea and constipation.  Gastrointestinal (Upper):   Patient denies nausea and vomiting.  Constitutional:   Patient denies fever, night sweats, weight loss, and fatigue.  Skin:   Patient denies skin rash/ lesion and itching.  Eyes:   Patient denies blurred vision and double vision.  Ears/ Nose/ Throat:   Patient denies sinus problems and sore throat.  Hematologic/Lymphatic:   Patient denies swollen glands and easy bruising.  Cardiovascular:   Patient denies leg swelling and chest pains.  Respiratory:   Patient denies cough and shortness of breath.  Endocrine:   Patient denies excessive thirst.  Musculoskeletal:   Patient denies back pain and joint pain.  Neurological:   Patient denies headaches and dizziness.  Psychologic:   Patient denies depression and anxiety.   VITAL SIGNS: None   MULTI-SYSTEM PHYSICAL EXAMINATION:    Constitutional:  Well-nourished. No physical deformities. Normally developed. Good grooming.     Complexity of Data:  Lab Test Review:   PSA  Records Review:   Pathology Reports, Previous Patient Records  X-Ray Review: PET- PSMA Scan: Reviewed Films.     03/07/22 01/29/21 07/31/17 06/09/15 08/12/14 07/04/14 12/20/13 10/12/12  PSA  Total PSA 9.65 ng/mL 4.42 ng/mL 4.10 ng/mL 5.53  47.70  3.89  4.14  10.39   Free PSA  0.91 ng/mL 1.05 ng/mL       % Free PSA  21 % PSA 26 % PSA        Notes:                     CLINICAL DATA: Prostate carcinoma with rising PSA  EXAM:  NUCLEAR MEDICINE PET SKULL BASE TO THIGH   TECHNIQUE:  9.6 mCi F18 Piflufolastat (Pylarify) was injected intravenously.  Full-ring PET imaging was performed from the skull base to thigh  after the radiotracer. CT data was obtained and used for attenuation  correction and anatomic localization.   COMPARISON: None Available.   FINDINGS:  NECK   No radiotracer activity in neck lymph nodes.   Incidental CT finding: None.   CHEST   No radiotracer accumulation within mediastinal or hilar lymph nodes.  No suspicious pulmonary nodules on the CT scan.   Incidental CT finding: None.   ABDOMEN/PELVIS   Prostate: Prostate gland is enlarged. There is mild activity  centrally within the RIGHT lobe of thyroid gland. A band of mild  activity posteriorly within the peripheral zone involving LEFT and  RIGHT gland. Prostate gland is enlarged measuring 6.5 by 5. 4 by 7.5  cm.   Lymph nodes: Small LEFT external iliac lymph node measuring 7 mm  (image 211/CT series 4) does not have significant radiotracer  activity. No radiotracer avid nodes in the pelvis.   Small node LEFT aorta in the retroperitoneum measures 8 mm (image  173/4) at the level of the mid LEFT kidney with without radiotracer  activity.   Liver: No evidence of liver metastasis.   Incidental CT finding: Bilateral nonobstructing renal calculi.  Partially exophytic RIGHT  renal cysts with simple fluid attenuation.   SKELETON   No focal activity to suggest skeletal metastasis. No suspicious  sclerotic lesions are lytic lesions. Degenerative osteophytosis of  the spine.   IMPRESSION:  1. Prostatomegaly. Moderate radiotracer within the posterior  peripheral zone is nonspecific..  2. No evidence of metastatic prostate cancer adenopathy in the  pelvis or periaortic retroperitoneum.  3. No visceral metastasis or skeletal metastasis    Electronically Signed  By: Suzy Bouchard M.D.  On: 06/08/2022 16:46   PROCEDURES: None   ASSESSMENT:      ICD-10 Details  1 GU:   Prostate Cancer - C61    PLAN:           Document Letter(s):  Created for Patient: Clinical Summary         Notes:   1. High risk prostate cancer: I had a detailed discussion with Mr. Buskey today. He also has already seen Dr. Alen Blew and is scheduled to meet with Dr. Tammi Klippel later this afternoon. The patient was counseled about the natural history of prostate cancer and the standard treatment options that are available for prostate cancer. It was explained to him how his age and life expectancy, clinical stage, Gleason score/prognostic grade group, and PSA (and PSA density) affect his prognosis, the decision to proceed with additional staging studies, as well as how that information influences recommended treatment strategies. We discussed the roles for active surveillance, radiation therapy, surgical therapy, androgen deprivation, as well as ablative therapy and other investigational options for the treatment of prostate cancer as appropriate to his individual cancer situation. We discussed the risks and benefits of these options with regard to their impact on cancer control and also in terms of potential adverse events, complications, and impact on quality of life particularly related to urinary and sexual function. The patient was encouraged to ask questions throughout the discussion today and  all questions were answered to his stated satisfaction. In addition, the patient was provided with and/or directed to appropriate resources and literature for further education about prostate cancer and treatment options.   He is  well-informed and is agreeable to proceed with therapy of curative intent. We did discuss the pros and cons of primary surgical therapy versus primary radiation therapy with long-term androgen deprivation. He understands that these are both appropriate options for treatment. He understands the potential increased risk with surgery based on his cardiovascular history. However, he also understands the implications of his very large prostate. Fortunately, he does not have very bothersome lower urinary tract symptoms and I therefore told him that he could proceed with either treatment and that there would not appear to be a large advantage for 1 over the other considering his current age and life expectancy as well as his disease parameters. He seems very well-informed and does have a son-in-law who is a Recruitment consultant in [place].   He is going to take some time to make a final decision. If he did elect to proceed with surgical therapy, I certainly would have him return for a preoperative physical exam and further discussion before treatment.   CC: Dr. Billey Gosling  Dr. Irine Seal  Dr. Tyler Pita  Dr. Zola Button    E & M CODES: We spent 58 minutes dedicated to evaluation and management time, including face to face interaction, discussions on coordination of care, documentation, result review, and discussion with others as applicable.

## 2022-07-05 NOTE — Progress Notes (Incomplete)
Radiation Oncology         [phone] ________________________________  Multidisciplinary Prostate Cancer Clinic  Initial Radiation Oncology Consultation  Name: Russell Trevino MRN: [id number]  Date: 07/05/2022  DOB: [birth year]  CC:Russell Rail, MD  Russell Seal, MD   REFERRING PHYSICIAN: Irine Seal, MD  DIAGNOSIS: 71 y.o. gentleman with stage T2a adenocarcinoma of the prostate with a Gleason's score of 4+5 and a PSA of 9.65 on finasteride    ICD-10-CM   1. Prostate cancer ([place])  C61       HISTORY OF PRESENT ILLNESS::Russell Trevino is a 71 y.o. gentleman with a longstanding history of BPH and elevated PSA, s/p TUVP in 01/2010 for management of obstructive urinary symptoms and intermittent hematuria. He was initially diagnosed with Gleason 3+3 prostate cancer by ultrasound-guided biopsy during cystoscopy on 10/16/12 for evaluation of gross hematuria and prostatic urethral polyp. PSA was 10.4 at the time of diagnosis. He elected to be followed in active surveillance and was placed on finasteride.  His PSA decreased to 3.89 (7.8 adjusted for finasteride) in 06/2014 and remained between 4-5 for several years. He had a surveillance biopsy on 08/11/14 which confirmed stable Gleason 3+3 prostate cancer. He elected to remain in active surveillance but was not seen by urology between 08/2017 and 12/2020.  His PSA remained stable in the 4-5 range until it increased to 9.65 in 02/2022 while still taking finasteride daily (19.4 adjusted). Digital rectal examination performed at follow-up on 03/07/22 showed a stable 8 mm right apex nodule. He underwent prostate MRI on 03/28/22 which showed a 1.8 cm PI-RADS 5 nodule in the right posteromedial mid gland peripheral zone and a 1.3 cm PI-RADS 4 nodule in the right posteromedial apex peripheral zone without evidence of extracapsular extension or pelvic metastatic disease. The patient proceeded to MRI fusion biopsy of the prostate on 05/17/22.  The prostate  volume measured 243 cc.  Out of 18 core biopsies, 10 were positive, all on the right side.  The maximum Gleason score was 4+5, and this was seen in 1 of 3 cores from ROI #1 (right mid). Additionally, Gleason 4+4 was seen in the other two ROI #1 cores and in the right mid lateral.additionally, Gleason 4+3 was seen in the right apex (with perineural invasion) and all three cores from ROI #2 (right apex), Gleason 3+4 was seen in the right mid (with PNI), and Gleason 3+3 in the right apex lateral.  He underwent staging PSMA PET scan on 06/07/22 showing no evidence of activity outside of the prostate.  The patient reviewed the biopsy results with his urologist and he has kindly been referred today to the multidisciplinary prostate cancer clinic for presentation of pathology and radiology studies in our conference for discussion of potential radiation treatment options and clinical evaluation.    PREVIOUS RADIATION THERAPY: No  PAST MEDICAL HISTORY:  has a past medical history of BPH (benign prostatic hypertrophy), Cardiomyopathy, ischemic, Coronary artery disease, Diabetes mellitus, type 2 ([place]), Frequency of urination, GERD (gastroesophageal reflux disease), Gross hematuria, History of CHF (congestive heart failure) (2007), History of kidney stones, History of myocardial infarction (2006, 2019), History of urinary retention, Hyperlipidemia, Nocturia, Renal calculi, and S/P CABG x 4 (2006).    PAST SURGICAL HISTORY: Past Surgical History:  Procedure Laterality Date   CARDIAC CATHETERIZATION  01/17/2008   GRAFTS PATENT / EF 40%,  Russell Trevino ([facility])   CORONARY ARTERY BYPASS GRAFT  04/2005   LIMA-LAD, L radial-OM-D1 and SVG-PDA   CYSTO /  RIGHT URETERAL STENT PLACEMENT  10-08-1999   CYSTO/  REMOVAL BLADDER STONES  01-14-2010   CYSTOSCOPY  10/16/2012   Procedure: CYSTOSCOPY;  Surgeon: Russell So, MD;  Location: [facility];  Service: Urology;  Laterality: N/A;  Cystoscopy with Clot  Evacuation and fulguration bladder neck.   EXTRACORPOREAL SHOCK WAVE LITHOTRIPSY  10-15-2009   RIGHT   KNEE ARTHROSCOPY  1978   RIGHT   LEFT HEART CATH AND CORS/GRAFTS ANGIOGRAPHY N/A 12/17/2021   Procedure: LEFT HEART CATH AND CORS/GRAFTS ANGIOGRAPHY;  Surgeon: Russell Osmond, MD;  Location: [place] [facility];  Service: Cardiovascular;  Laterality: N/A;   LEFT SHOULDER SURGERY   2002   LUMBAR FUSION  09-10-2008   L4 - L5   PERCUTANEOUS NEPHROSTOLITHOTOMY  1979   PROSTATE BIOPSY  10/16/2012   Procedure: PROSTATE BIOPSY;  Surgeon: Russell So, MD;  Location: [facility];  Service: Urology;  Laterality: N/A;  Through ultrasound.   RIGHT/LEFT HEART CATH AND CORONARY/GRAFT ANGIOGRAPHY N/A 10/26/2018   Procedure: RIGHT/LEFT HEART CATH AND CORONARY/GRAFT ANGIOGRAPHY;  Surgeon: Trevino, Russell M, MD;  Location: [place] [facility];  Service: Cardiovascular;  Laterality: N/A;   SHOULDER ARTHROSCOPY W/ SUBACROMIAL DECOMPRESSION AND DISTAL CLAVICLE EXCISION  10-11-2004   AND PARTIAL ROTATOR CUFF REPAIR   TRANSTHORACIC ECHOCARDIOGRAM  27/74/1287   LV SYSTOLIC FUNCTION MILDLY REDUCED/ EF 48%/ LV FILLING PATTERN  IS IMPAIRED/ HYPOKINESIS IN THE MID AND BASALAR SEPTUM & ANTEROSEPTUM   TURP VAPORIZATION  01-19-2010   BPH W/ BOO   URETEROLITHOTOMY  1976    FAMILY HISTORY: family history includes Diabetes in his father; Lung cancer in his mother.  SOCIAL HISTORY:  reports that he has never smoked. He has never used smokeless tobacco. He reports current alcohol use of about 2.0 standard drinks of alcohol per week. He reports that he does not use drugs.  ALLERGIES: Patient has no known allergies.  MEDICATIONS:  Current Outpatient Medications  Medication Sig Dispense Refill   aspirin 81 MG EC tablet Take 1 tablet (81 mg total) by mouth in the morning. 90 tablet 3   clopidogrel (PLAVIX) 75 MG tablet Take 1 tablet (75 mg total) by mouth daily. 90 tablet 3   cyclobenzaprine (FLEXERIL) 10 MG  tablet Take 10 mg by mouth at bedtime as needed for muscle spasms.     ezetimibe (ZETIA) 10 MG tablet Take 1 tablet (10 mg total) by mouth every evening. 90 tablet 1   finasteride (PROSCAR) 5 MG tablet TAKE 1 TABLET(5 MG) BY MOUTH DAILY (Patient taking differently: Take 5 mg by mouth at bedtime.) 90 tablet 1   furosemide (LASIX) 40 MG tablet Take 40 mg by mouth as needed.     gabapentin (NEURONTIN) 100 MG capsule Take 1 cap in morning and 2 cap HS (Patient taking differently: 100 mg daily as needed (for neuropathy).) 90 capsule 3   insulin glargine (LANTUS SOLOSTAR) 100 UNIT/ML Solostar Pen Inject 30 Units into the skin 2 (two) times daily. 30 mL 5   Insulin Pen Needle 31G X 6 MM MISC UAD for insulin pen 60 each 5   Insulin Syringe-Needle U-100 (BD VEO INSULIN SYRINGE U/F) 31G X 15/64" 1 ML MISC use as directed TO INJECT INSULIN  twice a day 10 each 2   isosorbide mononitrate (IMDUR) 120 MG 24 hr tablet Take 1 tablet (120 mg total) by mouth 2 (two) times daily. 180 tablet 3   metoprolol succinate (TOPROL-XL) 50 MG 24 hr tablet  Take 1 tablet (50 mg total) by mouth daily at 12 noon. Take with or immediately following a meal. 90 tablet 3   nitroGLYCERIN (NITROSTAT) 0.4 MG SL tablet PLACE 1 TABLET UNDER THE TONGUE EVERY 5 MINS FOR CHEST FOR PAIN 100 tablet 0   ranolazine (RANEXA) 500 MG 12 hr tablet Take 1 tablet (500 mg total) by mouth 2 (two) times daily. 180 tablet 3   rosuvastatin (CRESTOR) 40 MG tablet Take 1 tablet (40 mg total) by mouth daily. 90 tablet 3   sacubitril-valsartan (ENTRESTO) 49-51 MG Take 1 tablet by mouth 2 (two) times daily. 60 tablet 6   Semaglutide,0.25 or 0.'5MG'$ /DOS, (OZEMPIC, 0.25 OR 0.5 MG/DOSE,) 2 MG/1.5ML SOPN Inject 0.5 mg into the skin every Wednesday. 3 mL 2   No current facility-administered medications for this encounter.    REVIEW OF SYSTEMS:  On review of systems, the patient reports that he is doing well overall. He denies any chest pain, shortness of breath,  cough, fevers, chills, night sweats, unintended weight changes. He denies any bowel disturbances, and denies abdominal pain, nausea or vomiting. He denies any new musculoskeletal or joint aches or pains. His IPSS was 12, indicating moderate urinary symptoms. His SHIM was 10, indicating he has moderate-severe} erectile dysfunction. A complete review of systems is obtained and is otherwise negative.   PHYSICAL EXAM:  Wt Readings from Last 3 Encounters:  05/19/22 248 lb (112.5 kg)  04/25/22 247 lb (112 kg)  12/28/21 246 lb (111.6 kg)   Temp Readings from Last 3 Encounters:  12/24/21 98.1 F (36.7 C) (Oral)  12/17/21 98 F (36.7 C) (Oral)  08/18/21 98.4 F (36.9 C) (Oral)   BP Readings from Last 3 Encounters:  05/19/22 130/70  04/25/22 108/60  12/28/21 (!) 150/86   Pulse Readings from Last 3 Encounters:  05/19/22 63  04/25/22 76  12/28/21 71    /10  In general this is a well appearing Caucasian male in no acute distress. He's alert and oriented x4 and appropriate throughout the examination. Cardiopulmonary assessment is negative for acute distress and he exhibits normal effort.    KPS = 100  100 - Normal; no complaints; no evidence of disease. 90   - Able to carry on normal activity; minor signs or symptoms of disease. 80   - Normal activity with effort; some signs or symptoms of disease. 39   - Cares for self; unable to carry on normal activity or to do active work. 60   - Requires occasional assistance, but is able to care for most of his personal needs. 50   - Requires considerable assistance and frequent medical care. 85   - Disabled; requires special care and assistance. 85   - Severely disabled; hospital admission is indicated although death not imminent. 44   - Very sick; hospital admission necessary; active supportive treatment necessary. 10   - Moribund; fatal processes progressing rapidly. 0     - Dead  Karnofsky DA, Abelmann [place], Craver LS and Burchenal [facility] [phone]) The  use of the nitrogen mustards in the palliative treatment of carcinoma: with particular reference to bronchogenic carcinoma Cancer 1 634-56   LABORATORY DATA:  Lab Results  Component Value Date   WBC 9.7 12/24/2021   HGB 14.1 12/24/2021   HCT 41.7 12/24/2021   MCV 90.3 12/24/2021   PLT 184.0 12/24/2021   Lab Results  Component Value Date   NA 135 04/25/2022   K 5.6 (H) 04/25/2022   CL 99 04/25/2022  CO2 21 04/25/2022   Lab Results  Component Value Date   ALT 16 12/24/2021   AST 18 12/24/2021   ALKPHOS 59 12/24/2021   BILITOT 1.1 12/24/2021     RADIOGRAPHY: NM PET (PSMA) SKULL TO MID THIGH  Result Date: 06/08/2022 CLINICAL DATA:  Prostate carcinoma with rising PSA EXAM: NUCLEAR MEDICINE PET SKULL BASE TO THIGH TECHNIQUE: 9.6 mCi F18 Piflufolastat (Pylarify) was injected intravenously. Full-ring PET imaging was performed from the skull base to thigh after the radiotracer. CT data was obtained and used for attenuation correction and anatomic localization. COMPARISON:  None Available. FINDINGS: NECK No radiotracer activity in neck lymph nodes. Incidental CT finding: None. CHEST No radiotracer accumulation within mediastinal or hilar lymph nodes. No suspicious pulmonary nodules on the CT scan. Incidental CT finding: None. ABDOMEN/PELVIS Prostate: Prostate gland is enlarged. There is mild activity centrally within the RIGHT lobe of thyroid gland. A band of mild activity posteriorly within the peripheral zone involving LEFT and RIGHT gland. Prostate gland is enlarged measuring 6.5 by 5. 4 by 7.5 cm. Lymph nodes: Small LEFT external iliac lymph node measuring 7 mm (image 211/CT series 4) does not have significant radiotracer activity. No radiotracer avid nodes in the pelvis. Small node LEFT aorta in the retroperitoneum measures 8 mm (image 173/4) at the level of the mid LEFT kidney with without radiotracer activity. Liver: No evidence of liver metastasis. Incidental CT finding: Bilateral  nonobstructing renal calculi. Partially exophytic RIGHT renal cysts with simple fluid attenuation. SKELETON No focal activity to suggest skeletal metastasis. No suspicious sclerotic lesions are lytic lesions. Degenerative osteophytosis of the spine. IMPRESSION: 1. Prostatomegaly. Moderate radiotracer within the posterior peripheral zone is nonspecific. 2. No evidence of metastatic prostate cancer adenopathy in the pelvis or periaortic retroperitoneum. 3. No visceral metastasis or skeletal metastasis Electronically Signed   By: Suzy Bouchard Trevino.D.   On: 06/08/2022 16:46      IMPRESSION/PLAN: 71 y.o. gentleman with Stage T2a adenocarcinoma of the prostate with a Gleason score of 4+5 and a PSA of 9.65 (19.4 adjusted for finasteride).    We discussed the patient's workup and outlined the nature of prostate cancer in this setting. The patient's T stage, Gleason's score, and PSA put him into the high risk group. Accordingly, he is eligible for a variety of potential treatment options including LT-ADT in combination with 8 weeks of external radiation, or prostatectomy. We discussed the available radiation techniques, and focused on the details and logistics of delivery. The patient is not eligible for brachytherapy boost with a prostate volume of 243 cc despite finasteride and prior TUVP. We discussed and outlined the risks, benefits, short and long-term effects associated with radiotherapy and compared and contrasted these with prostatectomy. We discussed the role of SpaceOAR gel in reducing the rectal toxicity associated with radiotherapy. We also detailed the role of ADT in the treatment of high risk prostate cancer and outlined the associated side effects that could be expected with this therapy. He appears to have a good understanding of his disease and our treatment recommendations which are of curative intent.  He was encouraged to ask questions that were answered to his stated satisfaction.  At the end of  the conversation the patient is undecided regarding his treatment preference and plans to take some additional time to consider his options.  He is going to be out of the country, staying with a friend in [place], for the next 3 to 4 weeks and plans to reach a final decision  regarding robotic assisted laparoscopic prostatectomy versus LT-ADT concurrent with an 8-week course of daily prostate IMRT once he returns.  He is also contemplating whether to have daily radiation treatments here in [place] versus closer to his home in [place], [place].  He has our contact information and will let us know should he elect to proceed with radiotherapy and if he prefers to have treatment in [place], we are happy to make a referral to Russell. Priscille Heidelberg in radiation oncology with [facility].  Otherwise, we would be more than happy to continue to participate in his care should he elect to proceed with daily radiation here in [place].  We enjoyed meeting him today and he knows that he is welcome to call at anytime with any questions or concerns related to the proposed radiation treatment plan.   We personally spent 70 minutes in this encounter including chart review, reviewing radiological studies, meeting face-to-face with the patient, entering orders and completing documentation.    Nicholos Johns, PA-C    Tyler Pita, MD  [facility] Direct Dial: [phone]  Fax: [phone] [email or web address]  Skype  [email or web address]   This document serves as a record of services personally performed by Tyler Pita, MD and Freeman Caldron, PA-C. It was created on their behalf by Wilburn Mylar, a trained medical scribe. The creation of this record is based on the scribe's personal observations and the provider's statements to them. This document has been checked and approved by the attending provider.

## 2022-07-05 NOTE — Progress Notes (Signed)
                               Care Plan Summary  Name: Shigeo Baugh DOB: 1951/08/24   Your Medical Team:   Urologist -  Dr. Raynelle Bring, [facility]  Radiation Oncologist - Dr. Tyler Pita, [facility]   Medical Oncologist - Dr. Zola Button, [place]  Recommendations: 1) Surgery 2) External beam radiation and Androgen Deprivation Therapy  3)  * These recommendations are based on information available as of today's consult.      Recommendations may change depending on the results of further tests or exams.    Next Steps: 1)  Call Elizebeth Koller, Navigator with questions and when you have made your decision about your treatment plan and location of treatment at [phone]. 2) 3)  When appointments need to be scheduled, you will be contacted by [facility] and/or [facility].  Questions?  Please do not hesitate to call Katheren Puller, BSN, RN at [phone] with any questions or concerns.  Kathlee Nations is your Oncology Nurse Navigator and is available to assist you while you're receiving your medical care at [facility].

## 2022-07-05 NOTE — Progress Notes (Signed)
Reason for the request:    Prostate cancer  HPI: I was asked by Dr. Alinda Money to evaluate Russell Trevino for the evaluation of prostate cancer.  He is a 71 year old man with prostate cancer diagnosed in 20 2014 after presenting with a Gleason score of 6 and a PSA of 10.3.  He remained on active surveillance and did receive finasteride.  He had an PSA in 2022 at 4.042.  Repeat MRI in July 2023 showed 2 PI-RADS 5 and PI-RADS 4 lesions at the right apex.  PSMA PET scan did not show any evidence of metastatic disease.  Fusion biopsy completed in September 2023 showed high risk disease including Gleason score 9 and 1 core and Gleason score 8 and multiple other cores.  Targeted biopsy showed also Gleason score 8 4 positive cores.  Clinically, he reports no specific complaints related to his prostate cancer.  He denies any hematuria, dysuria or urinary frequency.  He remains active and attends activities of daily living.      Past Medical History:  Diagnosis Date   BPH (benign prostatic hypertrophy)    Cardiomyopathy, ischemic    Coronary artery disease    at [facility], [place] 2009 (GRAFTS PATENT), cath 05/2018 w/ SVG-PDA 100%>>med rx   Diabetes mellitus, type 2 (HCC)    Frequency of urination    GERD (gastroesophageal reflux disease)    Gross hematuria    History of CHF (congestive heart failure) 2007   History of kidney stones    History of myocardial infarction 2006, 2019   2006>>CABG, 2019 cath w/ med rx   History of urinary retention    Hyperlipidemia    Nocturia    Renal calculi    BILATERAL PER CT SCAN--  NON-OBSTRUCTIVE   S/P CABG x 4 2006  :   Past Surgical History:  Procedure Laterality Date   CARDIAC CATHETERIZATION  01/17/2008   GRAFTS PATENT / EF 40%,  DR SANTOS ([facility])   CORONARY ARTERY BYPASS GRAFT  04/2005   LIMA-LAD, L radial-OM-D1 and SVG-PDA   CYSTO / RIGHT URETERAL STENT PLACEMENT  10-08-1999   CYSTO/  REMOVAL BLADDER STONES  01-14-2010   CYSTOSCOPY  10/16/2012   Procedure:  CYSTOSCOPY;  Surgeon: Malka So, MD;  Location: [facility];  Service: Urology;  Laterality: N/A;  Cystoscopy with Clot Evacuation and fulguration bladder neck.   EXTRACORPOREAL SHOCK WAVE LITHOTRIPSY  10-15-2009   RIGHT   KNEE ARTHROSCOPY  1978   RIGHT   LEFT HEART CATH AND CORS/GRAFTS ANGIOGRAPHY N/A 12/17/2021   Procedure: LEFT HEART CATH AND CORS/GRAFTS ANGIOGRAPHY;  Surgeon: [name], MD;  Location: [facility];  Service: Cardiovascular;  Laterality: N/A;   LEFT SHOULDER SURGERY   2002   LUMBAR FUSION  09-10-2008   L4 - L5   PERCUTANEOUS NEPHROSTOLITHOTOMY  1979   PROSTATE BIOPSY  10/16/2012   Procedure: PROSTATE BIOPSY;  Surgeon: Malka So, MD;  [facility];  Service: Urology;  Laterality: N/A;  Through ultrasound.   RIGHT/LEFT HEART CATH AND CORONARY/GRAFT ANGIOGRAPHY N/A 10/26/2018   Procedure: RIGHT/LEFT HEART CATH AND CORONARY/GRAFT ANGIOGRAPHY;  Surgeon: Martinique, Peter M, MD;  Location: [facility];  Service: Cardiovascular;  Laterality: N/A;   SHOULDER ARTHROSCOPY W/ SUBACROMIAL DECOMPRESSION AND DISTAL CLAVICLE EXCISION  10-11-2004   AND PARTIAL ROTATOR CUFF REPAIR   TRANSTHORACIC ECHOCARDIOGRAM  23/55/7322   LV SYSTOLIC FUNCTION MILDLY REDUCED/ EF 48%/ LV FILLING PATTERN  IS IMPAIRED/ HYPOKINESIS IN THE  MID AND BASALAR SEPTUM & ANTEROSEPTUM   TURP VAPORIZATION  01-19-2010   BPH W/ BOO   URETEROLITHOTOMY  1976  :   Current Outpatient Medications:    aspirin 81 MG EC tablet, Take 1 tablet (81 mg total) by mouth in the morning., Disp: 90 tablet, Rfl: 3   clopidogrel (PLAVIX) 75 MG tablet, Take 1 tablet (75 mg total) by mouth daily., Disp: 90 tablet, Rfl: 3   cyclobenzaprine (FLEXERIL) 10 MG tablet, Take 10 mg by mouth at bedtime as needed for muscle spasms., Disp: , Rfl:    ezetimibe (ZETIA) 10 MG tablet, Take 1 tablet (10 mg total) by mouth every evening., Disp: 90 tablet, Rfl: 1   finasteride (PROSCAR) 5 MG  tablet, TAKE 1 TABLET(5 MG) BY MOUTH DAILY (Patient taking differently: Take 5 mg by mouth at bedtime.), Disp: 90 tablet, Rfl: 1   furosemide (LASIX) 40 MG tablet, Take 40 mg by mouth as needed., Disp: , Rfl:    gabapentin (NEURONTIN) 100 MG capsule, Take 1 cap in morning and 2 cap HS (Patient taking differently: 100 mg daily as needed (for neuropathy).), Disp: 90 capsule, Rfl: 3   insulin glargine (LANTUS SOLOSTAR) 100 UNIT/ML Solostar Pen, Inject 30 Units into the skin 2 (two) times daily., Disp: 30 mL, Rfl: 5   Insulin Pen Needle 31G X 6 MM MISC, UAD for insulin pen, Disp: 60 each, Rfl: 5   Insulin Syringe-Needle U-100 (BD VEO INSULIN SYRINGE U/F) 31G X 15/64" 1 ML MISC, use as directed TO INJECT INSULIN  twice a day, Disp: 10 each, Rfl: 2   isosorbide mononitrate (IMDUR) 120 MG 24 hr tablet, Take 1 tablet (120 mg total) by mouth 2 (two) times daily., Disp: 180 tablet, Rfl: 3   metoprolol succinate (TOPROL-XL) 50 MG 24 hr tablet, Take 1 tablet (50 mg total) by mouth daily at 12 noon. Take with or immediately following a meal., Disp: 90 tablet, Rfl: 3   nitroGLYCERIN (NITROSTAT) 0.4 MG SL tablet, PLACE 1 TABLET UNDER THE TONGUE EVERY 5 MINS FOR CHEST FOR PAIN, Disp: 100 tablet, Rfl: 0   ranolazine (RANEXA) 500 MG 12 hr tablet, Take 1 tablet (500 mg total) by mouth 2 (two) times daily., Disp: 180 tablet, Rfl: 3   rosuvastatin (CRESTOR) 40 MG tablet, Take 1 tablet (40 mg total) by mouth daily., Disp: 90 tablet, Rfl: 3   sacubitril-valsartan (ENTRESTO) 49-51 MG, Take 1 tablet by mouth 2 (two) times daily., Disp: 60 tablet, Rfl: 6   Semaglutide,0.25 or 0.'5MG'$ /DOS, (OZEMPIC, 0.25 OR 0.5 MG/DOSE,) 2 MG/1.5ML SOPN, Inject 0.5 mg into the skin every Wednesday., Disp: 3 mL, Rfl: 2:  No Known Allergies:   Family History  Problem Relation Age of Onset   Lung cancer Mother    Diabetes Father    Stomach cancer Neg Hx    Colon cancer Neg Hx    Rectal cancer Neg Hx    Esophageal cancer Neg Hx     Pancreatic cancer Neg Hx   :   Social History   Socioeconomic History   Marital status: Legally Separated    Spouse name: Not on file   Number of children: 4   Years of education: Not on file   Highest education level: Not on file  Occupational History   Not on file  Tobacco Use   Smoking status: Never   Smokeless tobacco: Never  Vaping Use   Vaping Use: Never used  Substance and Sexual Activity   Alcohol use: Yes  Alcohol/week: 2.0 standard drinks of alcohol    Types: 2 Cans of beer per week   Drug use: No   Sexual activity: Not Currently  Other Topics Concern   Not on file  Social History Narrative   Lives in [place], [place], has family and friends in [place].   Drinking caffeine sodas 3-4 bottles a day   Right handed   Social Determinants of Health   Financial Resource Strain: Not on file  Food Insecurity: Not on file  Transportation Needs: Not on file  Physical Activity: Not on file  Stress: Not on file  Social Connections: Not on file  Intimate Partner Violence: Not on file  :  Pertinent items are noted in HPI.  Exam:  General appearance: alert and cooperative appeared without distress. Head: atraumatic without any abnormalities. Eyes: conjunctivae/corneas clear. PERRL.  Sclera anicteric. Throat: lips, mucosa, and tongue normal; without oral thrush or ulcers. Resp: clear to auscultation bilaterally without rhonchi, wheezes or dullness to percussion. Cardio: regular rate and rhythm, S1, S2 normal, no murmur, click, rub or gallop GI: soft, non-tender; bowel sounds normal; no masses,  no organomegaly Skin: Skin color, texture, turgor normal. No rashes or lesions Lymph nodes: Cervical, supraclavicular, and axillary nodes normal. Neurologic: Grossly normal without any motor, sensory or deep tendon reflexes. Musculoskeletal: No joint deformity or effusion.    NM PET (PSMA) SKULL TO MID THIGH  Result Date: 06/08/2022 CLINICAL DATA:  Prostate carcinoma with  rising PSA EXAM: NUCLEAR MEDICINE PET SKULL BASE TO THIGH TECHNIQUE: 9.6 mCi F18 Piflufolastat (Pylarify) was injected intravenously. Full-ring PET imaging was performed from the skull base to thigh after the radiotracer. CT data was obtained and used for attenuation correction and anatomic localization. COMPARISON:  None Available. FINDINGS: NECK No radiotracer activity in neck lymph nodes. Incidental CT finding: None. CHEST No radiotracer accumulation within mediastinal or hilar lymph nodes. No suspicious pulmonary nodules on the CT scan. Incidental CT finding: None. ABDOMEN/PELVIS Prostate: Prostate gland is enlarged. There is mild activity centrally within the RIGHT lobe of thyroid gland. A band of mild activity posteriorly within the peripheral zone involving LEFT and RIGHT gland. Prostate gland is enlarged measuring 6.5 by 5. 4 by 7.5 cm. Lymph nodes: Small LEFT external iliac lymph node measuring 7 mm (image 211/CT series 4) does not have significant radiotracer activity. No radiotracer avid nodes in the pelvis. Small node LEFT aorta in the retroperitoneum measures 8 mm (image 173/4) at the level of the mid LEFT kidney with without radiotracer activity. Liver: No evidence of liver metastasis. Incidental CT finding: Bilateral nonobstructing renal calculi. Partially exophytic RIGHT renal cysts with simple fluid attenuation. SKELETON No focal activity to suggest skeletal metastasis. No suspicious sclerotic lesions are lytic lesions. Degenerative osteophytosis of the spine. IMPRESSION: 1. Prostatomegaly. Moderate radiotracer within the posterior peripheral zone is nonspecific. 2. No evidence of metastatic prostate cancer adenopathy in the pelvis or periaortic retroperitoneum. 3. No visceral metastasis or skeletal metastasis Electronically Signed   By: Suzy Bouchard M.D.   On: 06/08/2022 16:46    Assessment and Plan:   71 year old man with prostate cancer diagnosed in September 2023.  He was found to have  Gleason score 4+5 high risk disease in multiple cores with negative PSMA PET scan for advanced disease.  His PSA is 4.42 on finasteride.  The natural course of this disease was reviewed at this time and treatment choices were discussed.  His case was discussed in the prostate cancer multidisciplinary clinic including review  of his pathology results as well as imaging study with radiology as well as pathology.  Primary surgical therapy versus radiation therapy and long-term androgen deprivation were discussed.  Risks and benefits of both approaches were debated and potential complications were reviewed.  There is similar oncological outcome and depends on his preference and cardiac clearance whether he would prefer our surgery over radiation.  The role for additional systemic therapy were discussed including androgen receptor pathway inhibitors as well as chemotherapy which will be deferred at this time.  He will consider these options and decide in the near future.   30  minutes were dedicated to this visit. The time was spent on reviewing pathology results, imaging studies, discussing treatment options, discussing differential diagnosis and answering questions regarding future plan.

## 2022-07-06 ENCOUNTER — Other Ambulatory Visit: Payer: Self-pay | Admitting: *Deleted

## 2022-07-06 ENCOUNTER — Encounter: Payer: Self-pay | Admitting: Genetic Counselor

## 2022-07-06 NOTE — Progress Notes (Signed)
REFERRING PROVIDER: Zola Button, MD  PRIMARY PROVIDER:  Binnie Rail, MD  PRIMARY REASON FOR VISIT:  1. Prostate cancer ([place])     HISTORY OF PRESENT ILLNESS:   Russell Trevino, a 71 y.o. male, was seen for a [facility] cancer genetics consultation at the request of Dr. Alen Blew due to a personal and family history of cancer.  Russell Trevino presents to clinic today to discuss the possibility of a hereditary predisposition to cancer, to discuss genetic testing, and to further clarify his future cancer risks, as well as potential cancer risks for family members.   Russell Trevino is a 71 year old man with a history of prostate cancer diagnosed in 2014 (Gleason score of 6). Russell Trevino had a biopsy in September 2023 that showed upgraded high risk prostate cancer (Gleason score of 9).  CANCER HISTORY:  Oncology History  Prostate cancer ([place])  05/26/2016 Initial Diagnosis   Prostate cancer ([place])   05/17/2022 Cancer Staging   Staging form: Prostate, AJCC 8th Edition - Clinical stage from 05/17/2022: Stage IIIC (cT2a, cN0, cM0, PSA: 19.4, Grade Group: 5) - Signed by Freeman Caldron, PA-C on 07/05/2022 Histopathologic type: Adenocarcinoma, NOS Stage prefix: Initial diagnosis Prostate specific antigen (PSA) range: 10 to 19 Gleason primary pattern: 4 Gleason secondary pattern: 5 Gleason score: 9 Histologic grading system: 5 grade system Number of biopsy cores examined: 18 Number of biopsy cores positive: 7 Location of positive needle core biopsies: One side      Past Medical History:  Diagnosis Date   BPH (benign prostatic hypertrophy)    Cardiomyopathy, ischemic    Coronary artery disease    at [facility], [place] 2009 (GRAFTS PATENT), cath 05/2018 w/ SVG-PDA 100%>>med rx   Diabetes mellitus, type 2 (HCC)    Frequency of urination    GERD (gastroesophageal reflux disease)    Gross hematuria    History of CHF (congestive heart failure) 2007   History of kidney stones    History of myocardial infarction  2006, 2019   2006>>CABG, 2019 cath w/ med rx   History of urinary retention    Hyperlipidemia    Nocturia    Renal calculi    BILATERAL PER CT SCAN--  NON-OBSTRUCTIVE   S/P CABG x 4 2006    Past Surgical History:  Procedure Laterality Date   CARDIAC CATHETERIZATION  01/17/2008   GRAFTS PATENT / EF 40%,  DR SANTOS ([facility])   CORONARY ARTERY BYPASS GRAFT  04/2005   LIMA-LAD, L radial-OM-D1 and SVG-PDA   CYSTO / RIGHT URETERAL STENT PLACEMENT  10-08-1999   CYSTO/  REMOVAL BLADDER STONES  01-14-2010   CYSTOSCOPY  10/16/2012   Procedure: CYSTOSCOPY;  Surgeon: Malka So, MD;  Location: [facility];  Service: Urology;  Laterality: N/A;  Cystoscopy with Clot Evacuation and fulguration bladder neck.   EXTRACORPOREAL SHOCK WAVE LITHOTRIPSY  10-15-2009   RIGHT   KNEE ARTHROSCOPY  1978   RIGHT   LEFT HEART CATH AND CORS/GRAFTS ANGIOGRAPHY N/A 12/17/2021   Procedure: LEFT HEART CATH AND CORS/GRAFTS ANGIOGRAPHY;  Surgeon: [name], MD;  Location: [facility];  Service: Cardiovascular;  Laterality: N/A;   LEFT SHOULDER SURGERY   2002   LUMBAR FUSION  09-10-2008   L4 - L5   PERCUTANEOUS NEPHROSTOLITHOTOMY  1979   PROSTATE BIOPSY  10/16/2012   Procedure: PROSTATE BIOPSY;  Surgeon: Malka So, MD;  Location: [facility];  Service: Urology;  Laterality: N/A;  Through ultrasound.   RIGHT/LEFT  HEART CATH AND CORONARY/GRAFT ANGIOGRAPHY N/A 10/26/2018   Procedure: RIGHT/LEFT HEART CATH AND CORONARY/GRAFT ANGIOGRAPHY;  Surgeon: Martinique, Peter M, MD;  Location: [facility];  Service: Cardiovascular;  Laterality: N/A;   SHOULDER ARTHROSCOPY W/ SUBACROMIAL DECOMPRESSION AND DISTAL CLAVICLE EXCISION  10-11-2004   AND PARTIAL ROTATOR CUFF REPAIR   TRANSTHORACIC ECHOCARDIOGRAM  88/50/2774   LV SYSTOLIC FUNCTION MILDLY REDUCED/ EF 48%/ LV FILLING PATTERN  IS IMPAIRED/ HYPOKINESIS IN THE MID AND BASALAR SEPTUM & ANTEROSEPTUM   TURP VAPORIZATION  01-19-2010    BPH W/ BOO   URETEROLITHOTOMY  1976    Social History   Socioeconomic History   Marital status: Legally Separated    Spouse name: Not on file   Number of children: 4   Years of education: Not on file   Highest education level: Not on file  Occupational History   Not on file  Tobacco Use   Smoking status: Never   Smokeless tobacco: Never  Vaping Use   Vaping Use: Never used  Substance and Sexual Activity   Alcohol use: Yes    Alcohol/week: 2.0 standard drinks of alcohol    Types: 2 Cans of beer per week   Drug use: No   Sexual activity: Not Currently  Other Topics Concern   Not on file  Social History Narrative   Lives in [place], [place], has family and friends in [place].   Drinking caffeine sodas 3-4 bottles a day   Right handed   Social Determinants of Health   Financial Resource Strain: Not on file  Food Insecurity: Not on file  Transportation Needs: Not on file  Physical Activity: Not on file  Stress: Not on file  Social Connections: Not on file     FAMILY HISTORY:  We obtained a detailed, 4-generation family history.  Significant diagnoses are listed below:  Russell Trevino mother was diagnosed with lung cancer at age 23, she died at age 51. Russell Trevino is unaware of previous family history of genetic testing for hereditary cancer risks. There is no reported Ashkenazi Jewish ancestry.     GENETIC COUNSELING ASSESSMENT: Russell Trevino is a 71 y.o. male with a personal history of cancer which is somewhat suggestive of a hereditary predisposition to cancer given his personal history of high risk prostate cancer. We, therefore, discussed and recommended the following at today's visit.   DISCUSSION: We discussed that 5 - 10% of cancer is hereditary, with most cases of prostate cancer associated with BRCA1/2. There are other genes that can be associated with hereditary prostate cancer syndromes.  We discussed that testing is beneficial for several reasons including knowing how to  follow individuals after completing their treatment, identifying whether potential treatment options would be beneficial, and understanding if other family members could be at risk for cancer and allowing them to undergo genetic testing.   We reviewed the characteristics, features and inheritance patterns of hereditary cancer syndromes. We also discussed genetic testing, including the appropriate family members to test, the process of testing, insurance coverage and turn-around-time for results. We discussed the implications of a negative, positive, carrier and/or variant of uncertain significant result.   Russell Trevino elected to have [facility] CancerNext Panel. The CancerNext gene panel offered by [employer] includes sequencing, rearrangement analysis, and RNA analysis for the following 36 genes:   APC, ATM, AXIN2, BARD1, BMPR1A, BRCA1, BRCA2, BRIP1, CDH1, CDK4, CDKN2A, CHEK2, DICER1, HOXB13, EPCAM, GREM1, MLH1, MSH2, MSH3, MSH6, MUTYH, NBN, NF1, NTHL1, PALB2, PMS2, POLD1, POLE,  PTEN, RAD51C, RAD51D, RECQL, SMAD4, SMARCA4, STK11, and TP53.   Based on Russell Trevino personal history of cancer, he meets medical criteria for genetic testing. Despite that he meets criteria, he may still have an out of pocket cost. We discussed that if his out of pocket cost for testing is over $100, the laboratory will call and confirm whether he wants to proceed with testing.  If the out of pocket cost of testing is less than $100 he will be billed by the genetic testing laboratory.   PLAN: After considering the risks, benefits, and limitations, Russell Trevino provided informed consent to pursue genetic testing. He plans to have his blood drawn at a later date. Results should be available within approximately 2-3 weeks' time of his future blood draw, at which point they will be disclosed by telephone to Russell Trevino, as will any additional recommendations warranted by these results. Russell Trevino will receive a summary of his genetic counseling  visit and a copy of his results once available. This information will also be available in Epic.   Russell Trevino questions were answered to his satisfaction today. Our contact information was provided should additional questions or concerns arise. Thank you for the referral and allowing [place] to share in the care of your patient.   Russell Passy, MS, [facility] Genetic Counselor [email or web address] (P) [phone]  The patient was seen for a total of 20 minutes in face-to-face genetic counseling. Drs. Lindi Adie and/or Burr Medico were available to discuss this case as needed.   _______________________________________________________________________ For Office Staff:  Number of people involved in session: 1 Was an Intern/ student involved with case: no

## 2022-07-06 NOTE — Progress Notes (Signed)
The proposed treatment discussed in conference is for discussion purpose only and is not a binding recommendation.  The patients have not been physically examined, or presented with their treatment options.  Therefore, final treatment plans cannot be decided.  

## 2022-07-29 ENCOUNTER — Encounter: Payer: MEDICARE | Attending: Internal Medicine | Primary: Emergency Medicine

## 2022-07-29 DIAGNOSIS — I25119 Atherosclerotic heart disease of native coronary artery with unspecified angina pectoris: Secondary | ICD-10-CM

## 2022-07-31 NOTE — Patient Instructions (Signed)
Blood work was ordered.   The lab is on the first floor.    Medications changes include :  none     Return in about 1 year (around 08/04/2023) for Physical Exam.   Health Maintenance, Male Adopting a healthy lifestyle and getting preventive care are important in promoting health and wellness. Ask your health care provider about: The right schedule for you to have regular tests and exams. Things you can do on your own to prevent diseases and keep yourself healthy. What should I know about diet, weight, and exercise? Eat a healthy diet  Eat a diet that includes plenty of vegetables, fruits, low-fat dairy products, and lean protein. Do not eat a lot of foods that are high in solid fats, added sugars, or sodium. Maintain a healthy weight Body mass index (BMI) is a measurement that can be used to identify possible weight problems. It estimates body fat based on height and weight. Your health care provider can help determine your BMI and help you achieve or maintain a healthy weight. Get regular exercise Get regular exercise. This is one of the most important things you can do for your health. Most adults should: Exercise for at least 150 minutes each week. The exercise should increase your heart rate and make you sweat (moderate-intensity exercise). Do strengthening exercises at least twice a week. This is in addition to the moderate-intensity exercise. Spend less time sitting. Even light physical activity can be beneficial. Watch cholesterol and blood lipids Have your blood tested for lipids and cholesterol at 72 years of age, then have this test every 5 years. You may need to have your cholesterol levels checked more often if: Your lipid or cholesterol levels are high. You are older than 72 years of age. You are at high risk for heart disease. What should I know about cancer screening? Many types of cancers can be detected early and may often be prevented. Depending on your  health history and family history, you may need to have cancer screening at various ages. This may include screening for: Colorectal cancer. Prostate cancer. Skin cancer. Lung cancer. What should I know about heart disease, diabetes, and high blood pressure? Blood pressure and heart disease High blood pressure causes heart disease and increases the risk of stroke. This is more likely to develop in people who have high blood pressure readings or are overweight. Talk with your health care provider about your target blood pressure readings. Have your blood pressure checked: Every 3-5 years if you are 35-38 years of age. Every year if you are 58 years old or older. If you are between the ages of 62 and 1 and are a current or former smoker, ask your health care provider if you should have a one-time screening for abdominal aortic aneurysm (AAA). Diabetes Have regular diabetes screenings. This checks your fasting blood sugar level. Have the screening done: Once every three years after age 3 if you are at a normal weight and have a low risk for diabetes. More often and at a younger age if you are overweight or have a high risk for diabetes. What should I know about preventing infection? Hepatitis B If you have a higher risk for hepatitis B, you should be screened for this virus. Talk with your health care provider to find out if you are at risk for hepatitis B infection. Hepatitis C Blood testing is recommended for: Everyone born from [birth year] through 1966. Anyone with known risk factors  for hepatitis C. Sexually transmitted infections (STIs) You should be screened each year for STIs, including gonorrhea and chlamydia, if: You are sexually active and are younger than 72 years of age. You are older than 72 years of age and your health care provider tells you that you are at risk for this type of infection. Your sexual activity has changed since you were last screened, and you are at increased risk  for chlamydia or gonorrhea. Ask your health care provider if you are at risk. Ask your health care provider about whether you are at high risk for HIV. Your health care provider may recommend a prescription medicine to help prevent HIV infection. If you choose to take medicine to prevent HIV, you should first get tested for HIV. You should then be tested every 3 months for as long as you are taking the medicine. Follow these instructions at home: Alcohol use Do not drink alcohol if your health care provider tells you not to drink. If you drink alcohol: Limit how much you have to 0-2 drinks a day. Know how much alcohol is in your drink. In the [place] one drink equals one 12 oz bottle of beer (355 mL), one 5 oz glass of wine (148 mL), or one 1 oz glass of hard liquor (44 mL). Lifestyle Do not use any products that contain nicotine or tobacco. These products include cigarettes, chewing tobacco, and vaping devices, such as e-cigarettes. If you need help quitting, ask your health care provider. Do not use street drugs. Do not share needles. Ask your health care provider for help if you need support or information about quitting drugs. General instructions Schedule regular health, dental, and eye exams. Stay current with your vaccines. Tell your health care provider if: You often feel depressed. You have ever been abused or do not feel safe at home. Summary Adopting a healthy lifestyle and getting preventive care are important in promoting health and wellness. Follow your health care provider's instructions about healthy diet, exercising, and getting tested or screened for diseases. Follow your health care provider's instructions on monitoring your cholesterol and blood pressure. This information is not intended to replace advice given to you by your health care provider. Make sure you discuss any questions you have with your health care provider. Document Revised: 01/18/2021 Document Reviewed:  01/18/2021 Elsevier Patient Education  [place].

## 2022-07-31 NOTE — Progress Notes (Signed)
Subjective:    Patient ID: Russell Trevino, Russell Trevino    Russell Trevino, 71 y.o.   MRN: [id number]     HPI Russell Trevino is here for a physical exam.  To have radiation for his prostate cancer.    To have sleep study to r/o OSA.  States significantly decreased energy level.  He does not sleep well.  His decrease in energy level makes it difficult for him to exercise or do other things throughout the day.  He has lost 28 lbs.    Sugars in 160's on average.  He is following with GI in [place] and has PCP in [place] that is managing his diabetes.  He is up-to-date with his cardiologist.  He is following with urology for his prostate cancer.  Medications and allergies reviewed with patient and updated if appropriate.  Current Outpatient Medications on File Prior to Visit  Medication Sig Dispense Refill   amLODipine (NORVASC) 5 MG tablet Take 1 tablet by mouth daily.     aspirin 81 MG EC tablet Take 1 tablet (81 mg total) by mouth in the morning. 90 tablet 3   atorvastatin (LIPITOR) 80 MG tablet Take 80 mg by mouth daily.     carvedilol (COREG) 6.25 MG tablet Take 6.25 mg by mouth 2 (two) times daily with a meal.     celecoxib (CELEBREX) 100 MG capsule Take 1 capsule by mouth 2 (two) times daily.     ciprofloxacin (CIPRO) 500 MG tablet Take 500 mg by mouth 2 (two) times daily.     clopidogrel (PLAVIX) 75 MG tablet Take 1 tablet (75 mg total) by mouth daily. 90 tablet 3   clotrimazole (LOTRIMIN) 1 % cream Apply 1 Application topically 2 (two) times daily.     Continuous Blood Gluc Receiver (FREESTYLE LIBRE 2 READER) DEVI by Does not apply route.     Continuous Blood Gluc Sensor (FREESTYLE LIBRE 2 SENSOR) MISC by Does not apply route.     cyclobenzaprine (FLEXERIL) 10 MG tablet Take 10 mg by mouth at bedtime as needed for muscle spasms.     empagliflozin (JARDIANCE) 10 MG TABS tablet Take 1 tablet by mouth daily before breakfast.     erythromycin (E-MYCIN) 250 MG tablet Take 250  mg by mouth 3 (three) times daily.     esomeprazole (NEXIUM) 40 MG capsule Take 40 mg by mouth every morning.     eszopiclone (LUNESTA) 2 MG TABS tablet Take 1 tablet by mouth at bedtime as needed.     ezetimibe (ZETIA) 10 MG tablet Take 1 tablet (10 mg total) by mouth every evening. 90 tablet 1   famotidine (PEPCID) 40 MG tablet Take 40 mg by mouth daily.     finasteride (PROSCAR) 5 MG tablet TAKE 1 TABLET(5 MG) BY MOUTH DAILY (Patient taking differently: Take 5 mg by mouth at bedtime.) 90 tablet 1   fluconazole (DIFLUCAN) 150 MG tablet Take 150 mg by mouth once.     furosemide (LASIX) 40 MG tablet Take 40 mg by mouth as needed.     gabapentin (NEURONTIN) 100 MG capsule Take 1 cap in morning and 2 cap HS (Patient taking differently: 100 mg daily as needed (for neuropathy).) 90 capsule 3   insulin glargine (LANTUS SOLOSTAR) 100 UNIT/ML Solostar Pen Inject 30 Units into the skin 2 (two) times daily. 30 mL 5   Insulin Pen Needle 31G X 6 MM MISC UAD for insulin pen 60 each 5   Insulin Syringe-Needle  U-100 (BD VEO INSULIN SYRINGE U/F) 31G X 15/64" 1 ML MISC use as directed TO INJECT INSULIN  twice a day 10 each 2   isosorbide mononitrate (IMDUR) 120 MG 24 hr tablet Take 1 tablet (120 mg total) by mouth 2 (two) times daily. 180 tablet 3   metoprolol succinate (TOPROL-XL) 50 MG 24 hr tablet Take 1 tablet (50 mg total) by mouth daily at 12 noon. Take with or immediately following a meal. 90 tablet 3   nitroGLYCERIN (NITROSTAT) 0.4 MG SL tablet PLACE 1 TABLET UNDER THE TONGUE EVERY 5 MINS FOR CHEST FOR PAIN 100 tablet 0   ranolazine (RANEXA) 500 MG 12 hr tablet Take 1 tablet (500 mg total) by mouth 2 (two) times daily. 180 tablet 3   rosuvastatin (CRESTOR) 40 MG tablet Take 1 tablet (40 mg total) by mouth daily. 90 tablet 3   sacubitril-valsartan (ENTRESTO) 49-51 MG Take 1 tablet by mouth 2 (two) times daily. 60 tablet 6   Semaglutide, 1 MG/DOSE, (OZEMPIC, 1 MG/DOSE,) 4 MG/3ML SOPN Inject 1 mg into the  skin once a week.     No current facility-administered medications on file prior to visit.    Review of Systems  Constitutional:  Positive for fatigue. Negative for chills and fever.  Eyes:  Negative for visual disturbance.  Respiratory:  Positive for shortness of breath (DOE). Negative for cough and wheezing.   Cardiovascular:  Negative for chest pain, palpitations and leg swelling.  Gastrointestinal:  Negative for abdominal pain, blood in stool, constipation, diarrhea and nausea.       Gerd  Genitourinary:  Negative for dysuria.  Musculoskeletal:  Negative for arthralgias and back pain.  Skin:  Negative for rash.  Neurological:  Positive for light-headedness (if he bends over). Negative for headaches.  Psychiatric/Behavioral:  Negative for dysphoric mood. The patient is not nervous/anxious.        Objective:   Vitals:   08/03/22 1006  BP: 118/68  Pulse: 76  Temp: 98.9 F (37.2 C)  SpO2: 98%   Filed Weights   08/03/22 1006  Weight: 231 lb (104.8 kg)   Body mass index is 31.33 kg/m.  BP Readings from Last 3 Encounters:  08/03/22 118/68  07/05/22 (!) 146/77  05/19/22 130/70    Wt Readings from Last 3 Encounters:  08/03/22 231 lb (104.8 kg)  07/05/22 235 lb 9.6 oz (106.9 kg)  05/19/22 248 lb (112.5 kg)      Physical Exam Constitutional: He appears well-developed and well-nourished. No distress.  HENT:  Head: Normocephalic and atraumatic.  Right Ear: External ear normal.  Left Ear: External ear normal.  Mouth/Throat: Oropharynx is clear and moist.  Normal ear canals and TM b/l  Eyes: Conjunctivae and EOM are normal.  Neck: Neck supple. No tracheal deviation present. No thyromegaly present.  No carotid bruit  Cardiovascular: Normal rate, regular rhythm, normal heart sounds and intact distal pulses.   No murmur heard. Pulmonary/Chest: Effort normal and breath sounds normal. No respiratory distress. He has no wheezes. He has no rales.  Abdominal: Soft.   Umbilical hernia and ventral hernia-both reducible and nontender.  He exhibits no distension. There is no tenderness.  Genitourinary: deferred  Musculoskeletal: He exhibits no edema.  Lymphadenopathy:   He has no cervical adenopathy.  Skin: Skin is warm and dry. He is not diaphoretic.  Psychiatric: He has a normal mood and affect. His behavior is normal.         Assessment & Plan:   Physical  exam: Screening blood work  ordered Exercise   not regular Weight  has lost weight - still working on losing more Substance abuse   none   Reviewed recommended immunizations.   Health Maintenance  Topic Date Due   COLONOSCOPY (Pts 45-2yr Insurance coverage will need to be confirmed)  Never done   FOOT EXAM  05/12/2020   HEMOGLOBIN A1C  06/18/2022   COVID-19 Vaccine (3 - Moderna risk series) 08/19/2022 (Originally 01/07/2020)   Zoster Vaccines- Shingrix (1 of 2) 11/03/2022 (Originally 06/26/1970)   Diabetic kidney evaluation - GFR measurement  04/26/2023   Diabetic kidney evaluation - Urine ACR  04/28/2023   OPHTHALMOLOGY EXAM  05/26/2023   Medicare Annual Wellness (AWV)  06/24/2023   Pneumonia Vaccine 71 Years old  Completed   INFLUENZA VACCINE  Completed   Hepatitis C Screening  Completed   HPV VACCINES  Aged Out     See Problem List for Assessment and Plan of chronic medical problems.

## 2022-08-02 ENCOUNTER — Encounter: Payer: Self-pay | Admitting: Internal Medicine

## 2022-08-03 ENCOUNTER — Encounter: Payer: Self-pay | Admitting: Internal Medicine

## 2022-08-03 ENCOUNTER — Ambulatory Visit (INDEPENDENT_AMBULATORY_CARE_PROVIDER_SITE_OTHER): Payer: Medicare Other | Admitting: Internal Medicine

## 2022-08-03 VITALS — BP 118/68 | HR 76 | Temp 98.9°F | Ht 72.0 in | Wt 231.0 lb

## 2022-08-03 DIAGNOSIS — I5042 Chronic combined systolic (congestive) and diastolic (congestive) heart failure: Secondary | ICD-10-CM

## 2022-08-03 DIAGNOSIS — Z794 Long term (current) use of insulin: Secondary | ICD-10-CM | POA: Diagnosis not present

## 2022-08-03 DIAGNOSIS — C61 Malignant neoplasm of prostate: Secondary | ICD-10-CM

## 2022-08-03 DIAGNOSIS — I10 Essential (primary) hypertension: Secondary | ICD-10-CM | POA: Diagnosis not present

## 2022-08-03 DIAGNOSIS — N184 Chronic kidney disease, stage 4 (severe): Secondary | ICD-10-CM

## 2022-08-03 DIAGNOSIS — K429 Umbilical hernia without obstruction or gangrene: Secondary | ICD-10-CM | POA: Diagnosis not present

## 2022-08-03 DIAGNOSIS — E1122 Type 2 diabetes mellitus with diabetic chronic kidney disease: Secondary | ICD-10-CM

## 2022-08-03 DIAGNOSIS — N1832 Chronic kidney disease, stage 3b: Secondary | ICD-10-CM

## 2022-08-03 DIAGNOSIS — E782 Mixed hyperlipidemia: Secondary | ICD-10-CM | POA: Diagnosis not present

## 2022-08-03 DIAGNOSIS — Z0001 Encounter for general adult medical examination with abnormal findings: Secondary | ICD-10-CM | POA: Diagnosis not present

## 2022-08-03 DIAGNOSIS — Z0000 Encounter for general adult medical examination without abnormal findings: Secondary | ICD-10-CM

## 2022-08-03 DIAGNOSIS — K219 Gastro-esophageal reflux disease without esophagitis: Secondary | ICD-10-CM | POA: Diagnosis not present

## 2022-08-03 LAB — COMPREHENSIVE METABOLIC PANEL
ALT: 27 U/L (ref 0–53)
AST: 23 U/L (ref 0–37)
Albumin: 3.9 g/dL (ref 3.5–5.2)
Alkaline Phosphatase: 74 U/L (ref 39–117)
BUN: 34 mg/dL — ABNORMAL HIGH (ref 6–23)
CO2: 26 mEq/L (ref 19–32)
Calcium: 9.2 mg/dL (ref 8.4–10.5)
Chloride: 100 mEq/L (ref 96–112)
Creatinine, Ser: 2.18 mg/dL — ABNORMAL HIGH (ref 0.40–1.50)
GFR: 29.6 mL/min — ABNORMAL LOW (ref >60.00)
Glucose, Bld: 270 mg/dL — ABNORMAL HIGH (ref 70–99)
Potassium: 4.1 mEq/L (ref 3.5–5.1)
Sodium: 134 mEq/L — ABNORMAL LOW (ref 135–145)
Total Bilirubin: 1.6 mg/dL — ABNORMAL HIGH (ref 0.2–1.2)
Total Protein: 7.2 g/dL (ref 6.0–8.3)

## 2022-08-03 LAB — CBC WITH DIFFERENTIAL/PLATELET
Basophils Absolute: 0.1 10*3/uL (ref 0.0–0.1)
Basophils Relative: 0.8 % (ref 0.0–3.0)
Eosinophils Absolute: 0.3 10*3/uL (ref 0.0–0.7)
Eosinophils Relative: 3.9 % (ref 0.0–5.0)
HCT: 40.3 % (ref 39.0–52.0)
Hemoglobin: 14.2 g/dL (ref 13.0–17.0)
Lymphocytes Relative: 23.2 % (ref 12.0–46.0)
Lymphs Abs: 1.6 10*3/uL (ref 0.7–4.0)
MCHC: 35.3 g/dL (ref 30.0–36.0)
MCV: 87.7 fl (ref 78.0–100.0)
Monocytes Absolute: 0.7 10*3/uL (ref 0.1–1.0)
Monocytes Relative: 9.7 % (ref 3.0–12.0)
Neutro Abs: 4.2 10*3/uL (ref 1.4–7.7)
Neutrophils Relative %: 62.4 % (ref 43.0–77.0)
Platelets: 162 10*3/uL (ref 150.0–400.0)
RBC: 4.59 Mil/uL (ref 4.22–5.81)
RDW: 14 % (ref 11.5–15.5)
WBC: 6.8 10*3/uL (ref 4.0–10.5)

## 2022-08-03 LAB — LIPID PANEL
Cholesterol: 132 mg/dL (ref 0–200)
HDL: 40.9 mg/dL (ref >39.00)
LDL Cholesterol: 54 mg/dL (ref 0–99)
NonHDL: 90.8
Total CHOL/HDL Ratio: 3
Triglycerides: 185 mg/dL — ABNORMAL HIGH (ref 0.0–149.0)
VLDL: 37 mg/dL (ref 0.0–40.0)

## 2022-08-03 LAB — TSH: TSH: 2.39 u[IU]/mL (ref 0.35–5.50)

## 2022-08-03 LAB — HEMOGLOBIN A1C: Hgb A1c MFr Bld: 11.8 % — ABNORMAL HIGH (ref 4.6–6.5)

## 2022-08-03 NOTE — Assessment & Plan Note (Addendum)
Chronic  Lab Results  Component Value Date   HGBA1C 11.2 (H) 12/17/2021   Sugars not well controlled Testing sugars 1 times a day Check A1c Continue Ozempic, Lantus 30 units twice daily Stressed regular exercise, diabetic diet

## 2022-08-03 NOTE — Assessment & Plan Note (Signed)
Chronic Last GFR in our system was 31 He does see a primary care physician in [place], [place]

## 2022-08-03 NOTE — Assessment & Plan Note (Signed)
Chronic Regular exercise and healthy diet encouraged Check lipid panel  Continue Zetia 10 mg daily, Crestor 40 mg daily 

## 2022-08-03 NOTE — Assessment & Plan Note (Signed)
Chronic Following with GI in [place] Was recently put on the medication that he takes before meals because he was having heartburn and for like his stomach was not emptying-?  Reglan for gastroparesis Discussed diet Ozempic can increase his risk of GERD and worsen his gastroparesis and discuss this with his gastroenterologist and primary care physician in [place]

## 2022-08-03 NOTE — Assessment & Plan Note (Signed)
Chronic Also with ventral hernia Both Asymptomatic Will monitor

## 2022-08-03 NOTE — Assessment & Plan Note (Signed)
Chronic Blood pressure well controlled CMP Continue metoprolol XL 50 mg daily, Imdur 120 mg twice daily, Entresto 49-51 mg twice daily

## 2022-08-03 NOTE — Assessment & Plan Note (Addendum)
Chronic Following with cardiology Appears euvolemic Lasix 40 mg as needed, metoprolol XL 50 mg daily, Entresto 49-51 mg twice daily

## 2022-08-08 ENCOUNTER — Encounter: Payer: Self-pay | Admitting: *Deleted

## 2022-08-08 NOTE — Progress Notes (Signed)
I called Russell Trevino to follow up his visit to [facility] and to see if he had made a decision about his treatment plan for his prostate cancer.  He lives in [place] and would like to receive his treatment of external beam radiation and ADT here if he can start after the first of the year.  I told him I would communicate this information to the [facility] team and he would be receiving calls to schedule his appointments.  He denied any other questions.  I encouraged him to call for any questions or needs.  I have communicated his decision to the [facility] team.

## 2022-08-08 NOTE — Progress Notes (Signed)
Formatting of this note might be different from the original.  I called Alfred Weiss to follow up his visit to [facility] and to see if he had made a decision about his treatment plan for his prostate cancer.  He lives in [place] and would like to receive his treatment of external beam radiation and ADT here if he can start after the first of the year.  I told him I would communicate this information to the [facility] team and he would be receiving calls to schedule his appointments.  He denied any other questions.  I encouraged him to call for any questions or needs.  I have communicated his decision to the [facility] team.  Electronically signed by Jesusita Oka, RN at 08/08/2022  4:18 PM EST

## 2022-08-11 NOTE — Telephone Encounter (Signed)
Taken care of

## 2022-08-22 ENCOUNTER — Encounter

## 2022-08-23 ENCOUNTER — Ambulatory Visit: Admit: 2022-08-23 | Discharge: 2022-08-23 | Payer: MEDICARE | Attending: Internal Medicine | Primary: Emergency Medicine

## 2022-08-23 DIAGNOSIS — E875 Hyperkalemia: Secondary | ICD-10-CM

## 2022-08-23 DIAGNOSIS — I5022 Chronic systolic (congestive) heart failure: Secondary | ICD-10-CM | POA: Diagnosis not present

## 2022-08-23 DIAGNOSIS — I110 Hypertensive heart disease with heart failure: Secondary | ICD-10-CM | POA: Diagnosis not present

## 2022-08-23 DIAGNOSIS — I5020 Unspecified systolic (congestive) heart failure: Secondary | ICD-10-CM | POA: Diagnosis not present

## 2022-08-23 DIAGNOSIS — I340 Nonrheumatic mitral (valve) insufficiency: Secondary | ICD-10-CM | POA: Diagnosis not present

## 2022-08-23 DIAGNOSIS — I2510 Atherosclerotic heart disease of native coronary artery without angina pectoris: Secondary | ICD-10-CM | POA: Diagnosis not present

## 2022-08-23 NOTE — Progress Notes (Signed)
Date:  August 23, 2022  Patient name: Alfred Weiss  Date of Birth: May 02, 1951    CARDIOLOGY CLINIC EVALUATION    REASON FOR REFERRAL: ICM, CAD    HISTORY OF PRESENT ILLNESS          The patient presents to our clinic as a new patient referral.  The referral was reportedly placed for prior history of ischemic cardiomyopathy, CAD.  The patient was recently seen by his PCP on 06/23/2022.  At that time he was establishing a new primary care physician.  Patient had previously been getting all of his medical care in [place].  The patient is a past medical history significant for cardiomyopathy, CAD status post CABG 2006, DM, CKD, HTN, HLD, prostate cancer.  His baseline creatinine is reportedly around 2.0.  He had a recent A1c of 9.7.  He also a recent LDL of 88.  His most recent left heart catheterization was reportedly in 2020 and was found have multivessel disease that was treated medically.  Of note he does not want a follow-up with a nephrologist.  He also follows up with a gastroenterologist for prior history of gastric bezoar.    The patient states that he came to the office today because he needed to establish a cardiologist in the area.  His prior cardiologist workup as noted below.  The patient states that in 2006 he had a heart attack, ended up needing four-vessel bypass.  He has been closely following with cardiology.  Most recently he was seen for preoperative evaluation, he had had a recent left heart catheterization, overall had no significant changes from prior other than possible interval progression in left main disease with recommendation for medical management and to proceed with surgery.  The patient is not sure which medications he is currently taking.  He does not think he is taking Lasix every day.  He has not been having any  shortness of breath, chest pain, chest pressure, lower extremity swelling.  The patient states that he spends a lot of time trying to take care of his girlfriend who has stage IV lung cancer and lives out of town.    Per prior cardiology documentation:  Patient was last seen by his other cardiologist on 04/25/2022.  At that time he was actually being seen for preoperative clearance.  Patent LIMA to LAD as well as a radial off his LIMA to his OM/D1 and had a known occluded SVG to RCA but then had a newly occluded left main and was recommended for medical management.  He was cleared for his surgery without further intervention.  TTE 10/26/2018 showed EF 40 to 45%, normal RV size and function,  Prior workup: Per their note the patient undergone a recent left heart catheterization, was found to have a  LHC 2009 EF 40%, patent LIMA to LAD/left radial to OM-D1/SVG to PDA all patent.  LHC 2023 severe underlying multivessel disease, patent LIMA to LAD, patent radial off LIMA to D1/OM, known occluded SVG to RCA.  TTE 12/17/2021 EF 20 to 47%, RV systolic function moderately reduced with normal RV size, mildly dilated LA, trace MR, aortic root 4.1 cm.  Moderate MR.    PAST MEDICAL, SOCIAL AND FAMILY HISTORY          Past Medical History:   has a past medical history of CAD (coronary artery disease), CKD (chronic kidney disease) stage 3, GFR 30-59 ml/min (HCC), Elevated uric acid in blood, GERD (gastroesophageal reflux disease), History of prostate cancer,  Ischemic cardiomyopathy, Medicare annual wellness visit, subsequent, Mixed hyperlipidemia, Poorly controlled type 1 diabetes mellitus with complication ([place]), and Primary hypertension.  Patient presents to our clinic as a new patient referral.  The referral was reportedly placed for  Past Surgical History:   has a past surgical history that includes knee surgery (Left, 1974); Kidney stone removal (1978); Colonoscopy (2003); Shoulder open rotator cuff repair (Left, 2006); Coronary  artery bypass graft (2006); lumbar fusion (2009); TURP (2011); Prostate biopsy (2014); and Cardiac catheterization (Left, 10/2018).     Social History:   reports that he has never smoked. He has never used smokeless tobacco. He reports current alcohol use. He reports that he does not use drugs.     Family History: family history includes Coronary Art Dis in his father; Heart Attack in his father; Lung Cancer in his mother.  MEDICATIONS AND ALLERGIES        Prior to Admission medications    Medication Sig Start Date End Date Taking? Authorizing Provider   atorvastatin (LIPITOR) 80 MG tablet Take 1 tablet by mouth daily   Yes [provider]   carvedilol (COREG) 6.25 MG tablet Take 1 tablet by mouth 2 times daily (with meals)   Yes [provider]   celecoxib (CELEBREX) 100 MG capsule Take 1 capsule by mouth 2 times daily   Yes [provider]   clotrimazole (LOTRIMIN) 1 % cream Apply topically 2 times daily Apply topically 2 times daily.   Yes [provider]   eszopiclone (LUNESTA) 2 MG TABS Take 1 tablet by mouth nightly. Max Daily Amount: 2 mg   Yes [provider]   gabapentin (NEURONTIN) 100 MG capsule Take 1 capsule by mouth 3 times daily.   Yes [provider]   pantoprazole (PROTONIX) 40 MG tablet Take 1 tablet by mouth every morning (before breakfast) 06/29/22  Yes Karen Kitchens, MD   sacubitril-valsartan [facility]) 49-51 MG per tablet 1 tablet Orally Twice a day 06/23/22  Yes Karen Kitchens, MD   rosuvastatin (CRESTOR) 40 MG tablet 1 tablet Orally Once a day 06/23/22  Yes Karen Kitchens, MD   ranolazine (RANEXA) 500 MG extended release tablet Take 1 tablet by mouth 2 times daily 06/23/22  Yes Karen Kitchens, MD   OZEMPIC, 0.25 OR 0.5 MG/DOSE, 2 MG/3ML SOPN 0.5 mg/week subcu 06/23/22  Yes Karen Kitchens, MD   nitroGLYCERIN (NITROSTAT) 0.4 MG SL tablet PLACE 1 TABLET UNDER THE TONGUE EVERY 5 MINS FOR CHEST FOR PAIN 06/23/22  Yes Karen Kitchens, MD   metoprolol succinate  (TOPROL XL) 50 MG extended release tablet Take 1 tablet by mouth daily At noon 06/23/22  Yes Karen Kitchens, MD   LANTUS SOLOSTAR 100 UNIT/ML injection pen ADMINISTER 30 UNITS UNDER THE SKIN TWICE DAILY 06/23/22  Yes Karen Kitchens, MD   isosorbide mononitrate (IMDUR) 120 MG extended release tablet Take 1 tablet by mouth 2 times daily 06/23/22  Yes Karen Kitchens, MD   furosemide (LASIX) 40 MG tablet Take 1 tablet by mouth daily 06/23/22  Yes Karen Kitchens, MD   finasteride (PROSCAR) 5 MG tablet 1 tablet Orally Once per day at 12 noon 06/23/22  Yes Karen Kitchens, MD   famotidine (PEPCID) 40 MG tablet 1 tablet at bedtime Orally Once a day for 90 days 06/23/22  Yes Karen Kitchens, MD   ezetimibe (ZETIA) 10 MG tablet 1 tablet Orally Once a day 06/23/22  Yes Karen Kitchens, MD   clopidogrel (PLAVIX) 75 MG tablet 1 tablet Orally  Once per day 06/23/22  Yes Karen Kitchens, MD   [facility]) 5 MG tablet Take 1 tablet by mouth daily 06/23/22 07/28/23 Yes Karen Kitchens, MD   B-D UF III MINI PEN NEEDLES 31G X 5 MM MISC USE AS DIRECTED FOR INSULIN PEN 06/23/22  Yes Karen Kitchens, MD   Semaglutide, 1 MG/DOSE, 4 MG/3ML SOPN Inject 1 mg into the skin once a week 06/23/22 09/21/22 Yes Karen Kitchens, MD   Continuous Blood Gluc Sensor (FREESTYLE LIBRE 2 SENSOR) MISC 1 each by Does not apply route every 14 days 06/23/22 09/21/22 Yes Karen Kitchens, MD   Continuous Blood Gluc Receiver (FREESTYLE LIBRE 2 READER) DEVI 1 each by Does not apply route daily 06/23/22 09/21/22 Yes Karen Kitchens, MD   cyclobenzaprine (FLEXERIL) 10 MG tablet  06/13/22  Yes [provider]   ASPIRIN LOW DOSE 81 MG EC tablet  12/28/21  Yes [provider]       Allergies:  Patient has no known allergies.  REVIEW OF SYSTEMS            See separate MA documentation regarding review of systems.       PHYSICAL EXAM          BP (!) 150/74 (Site: Left Upper Arm, Position: Sitting, Cuff Size: Large Adult)   Pulse 77   Resp 18   Ht 1.829 m (6')   Wt 104.2 kg (229 lb 12.8 oz)    SpO2 98%   BMI 31.17 kg/m      Physical Exam  Constitutional:       Appearance: Normal appearance.   HENT:      Head: Normocephalic and atraumatic.   Eyes:      Extraocular Movements: Extraocular movements intact.      Pupils: Pupils are equal, round, and reactive to light.   Cardiovascular:      Rate and Rhythm: Normal rate and regular rhythm.      Heart sounds: No murmur heard.  Pulmonary:      Effort: Pulmonary effort is normal.      Breath sounds: Normal breath sounds.   Abdominal:      General: Abdomen is flat. Bowel sounds are normal.      Palpations: Abdomen is soft.   Musculoskeletal:         General: No swelling.   Skin:     General: Skin is warm and dry.   Neurological:      General: No focal deficit present.      Mental Status: He is alert.   Psychiatric:         Mood and Affect: Mood normal.        RECENT CARDIAC DIAGNOSTICS          The 10-year ASCVD risk score (Arnett DK, et al., 2019) is: 49%    Values used to calculate the score:      Age: 71 years      Sex: Male      Is Non-Hispanic African American: No      Diabetic: Yes      Tobacco smoker: No      Systolic Blood Pressure: 528 mmHg      Is BP treated: Yes      HDL Cholesterol: 47 mg/dL      Total Cholesterol: 176 mg/dL  Lab Results   Component Value Date    CHOL 176 04/27/2022    TRIG 206 (H) 04/27/2022    HDL 47 04/27/2022  LDLCHOLESTEROL 87.8 04/27/2022    VLDL 41.2 (H) 04/27/2022    CHOLHDLRATIO 3.7 04/27/2022     Hemoglobin A1C   Date Value Ref Range Status   04/27/2022 9.5 (H) 4.0 - 6.0 % Final     Comment:     HEMOGLOBIN A1C INTERPRETATION:    The following arbitrary ranges may be used for interpretation of the results.  However, factors such as duration of diabetes, adherence to therapy, and  patient age should also be considered in assessing degree of blood glucose  control.    Hemoglobin A1C                 Avg. Blood Sugar  --------------------------------------------------------------  6%                           135 mg/dL  7%                            170 mg/dL  8%                           205 mg/dL  9%                           240 mg/dL  10%                          275 mg/dL    ======================================================    A1C                      Glucose Control  ----------------------------------------------------------------  < 6.0 %                   Normal  6.0 - 6.9 %               Abnormal  7.0 - 7.9 %               Sub-Optimal Control  > 8.0 %                   Inadequate Control         ASSESSMENT AND RECOMMENDATIONS            1. Hyperkalemia  -     Basic Metabolic Panel; Future  2. Coronary artery disease involving native coronary artery of native heart without angina pectoris  3. HFrEF (heart failure with reduced ejection fraction) ([place])  4. Hypertensive heart disease with chronic systolic congestive heart failure ([place])  5. Nonrheumatic mitral valve regurgitation      Plan:   -Patient has stage C, class II, chronic systolic heart failure, likely ischemic etiology, has been prescribed GDMT in the past but unclear what he is taking now, does not require daily loop diuretics  -BP above goal, home blood pressures are better controlled  -Euvolemic on exam today  -HR slightly above goal  -Since the patient is not clear on which GDMT is taking I do not want to make any changes until he brings his medications in with him to his next visit  -The patient denies any referral for ICD placement thus far, it seems that his EF was around 45% and most recently was reduced to 20 to 25% with no repeat echocardiogram since, I will repeat this before his next  visit  -Prior echocardiograms have shown significant MR that was then improved on subsequent echocardiogram, patient appears euvolemic on exam today  -Most recent metabolic panel was showing a creatinine of 2 which is higher than his past, also started potassium 5.5, again unclear what medications he is taking  -I will repeat a metabolic panel before his next visit  -Follow-up  with me after the results of the above      Thank you for allowing me to participate in the care of your patient.       NOTE: This report was transcribed using voice recognition software. Every effort was made to ensure accuracy, however, inadvertent computerized transcription errors may be present.

## 2022-08-23 NOTE — Patient Instructions (Signed)
Schedule echocardiogram  Schedule FU 1-2 weeks later  Get blood drawn close to that visit.

## 2022-08-23 NOTE — Progress Notes (Signed)
Review of Systems   Constitutional:  Positive for fatigue. Negative for chills, fever and unexpected weight change.   HENT:  Negative for hearing loss, nosebleeds, sore throat, tinnitus and voice change.    Eyes:  Negative for visual disturbance.   Respiratory:  Positive for shortness of breath. Negative for apnea, cough, chest tightness and wheezing.    Cardiovascular:  Negative for chest pain, palpitations and leg swelling.   Gastrointestinal:  Negative for anal bleeding, blood in stool, constipation and diarrhea.   Endocrine: Positive for polydipsia.   Genitourinary:  Negative for difficulty urinating, dysuria, hematuria and urgency.   Musculoskeletal:  Negative for myalgias.   Skin:  Negative for rash and wound.   Neurological:  Positive for light-headedness. Negative for dizziness, syncope, weakness and numbness.   Hematological:  Does not bruise/bleed easily.   Psychiatric/Behavioral:  Negative for sleep disturbance.

## 2022-08-24 ENCOUNTER — Telehealth: Payer: Self-pay | Admitting: Cardiology

## 2022-08-24 ENCOUNTER — Other Ambulatory Visit: Payer: Self-pay | Admitting: Urology

## 2022-08-24 ENCOUNTER — Telehealth: Payer: Self-pay | Admitting: *Deleted

## 2022-08-24 NOTE — Telephone Encounter (Signed)
   Pre-operative Risk Assessment    Patient Name: Russell Trevino  DOB: 1950-11-05 MRN: [id number]      Request for Surgical Clearance    Procedure:  gold seed markers and spaceoar placement in prostate  Date of Surgery:  Clearance 10/06/21                                 Surgeon:  Dr. Raynelle Bring  Surgeon's Group or Practice Name:  [facility] Phone number:  [phone] Fax number:  [phone]   Type of Clearance Requested:   - Medical  - Pharmacy:  Hold Aspirin and Clopidogrel (Plavix) 5 days for both   Type of Anesthesia:  MAC   Additional requests/questions:    Signed, Selena Zobro   08/24/2022, 8:23 AM

## 2022-08-24 NOTE — Telephone Encounter (Signed)
CALLED PATIENT TO INFORM OF FID. MARK[employer] SPACE OAR PLACEMENT ON 10/06/22 AND HIS SIM ON 10-13-22- ARRIVAL TIME- 8:45 AM, INFORMED PATIENT TO ARRIVE WITH A FULL BLADDER, SPOKE WITH PATIENT AND HE IS AWARE OF THESE APPTS. AND THE INSTRUCTIONS

## 2022-08-24 NOTE — Telephone Encounter (Signed)
I called the pt and left message to call back, see notes from pre op provider in regard to pt was seen by [facility] yesterday. I read the notes from [facility] which states pt was referred for new pt appt. We will need to confirm if the pt has moved and [facility] is his new cardiology office. If so clearance will need to come from [facility].

## 2022-08-25 NOTE — Telephone Encounter (Signed)
2nd attempt to reach pt to verify primary cardiologist.

## 2022-08-26 NOTE — Telephone Encounter (Signed)
I have tried to reach the pt again today, though no answer. See previous notes. I will send an FYI message to the requesting office. In review of notes from [facility] it seems the pt may have established with another cardiology practice. We have tried to reach the pt to confirm this. Requesting office may want to try for clearance from [facility].   I will remove from the pre op call back pool at this. Will re-address if the pt calls back.

## 2022-08-29 NOTE — Telephone Encounter (Signed)
We have attempted x 3 to reach the pt, see previous notes . I will send notes to requesting office, they may need to reach out to [facility], as it is unclear if the pt has moved. I will remove from pre op call back at this time.

## 2022-09-07 ENCOUNTER — Encounter

## 2022-09-08 ENCOUNTER — Telehealth: Payer: Self-pay | Admitting: *Deleted

## 2022-09-08 NOTE — Telephone Encounter (Signed)
Pt has been scheduled for a tele visit, 09/19/22 2:20.  Consent on file / medications reconciled.    Patient Consent for Virtual Visit        Russell Trevino has provided verbal consent on 09/08/2022 for a virtual visit (video or telephone).   CONSENT FOR VIRTUAL VISIT FOR:  Russell Trevino  By participating in this virtual visit I agree to the following:  I hereby voluntarily request, consent and authorize [facility] and its employed or contracted physicians, physician assistants, nurse practitioners or other licensed health care professionals (the Practitioner), to provide me with telemedicine health care services (the "Services") as deemed necessary by the treating Practitioner. I acknowledge and consent to receive the Services by the Practitioner via telemedicine. I understand that the telemedicine visit will involve communicating with the Practitioner through live audiovisual communication technology and the disclosure of certain medical information by electronic transmission. I acknowledge that I have been given the opportunity to request an in-person assessment or other available alternative prior to the telemedicine visit and am voluntarily participating in the telemedicine visit.  I understand that I have the right to withhold or withdraw my consent to the use of telemedicine in the course of my care at any time, without affecting my right to future care or treatment, and that the Practitioner or I may terminate the telemedicine visit at any time. I understand that I have the right to inspect all information obtained and/or recorded in the course of the telemedicine visit and may receive copies of available information for a reasonable fee.  I understand that some of the potential risks of receiving the Services via telemedicine include:  Delay or interruption in medical evaluation due to technological equipment failure or disruption; Information transmitted may not be sufficient  (e.g. poor resolution of images) to allow for appropriate medical decision making by the Practitioner; and/or  In rare instances, security protocols could fail, causing a breach of personal health information.  Furthermore, I acknowledge that it is my responsibility to provide information about my medical history, conditions and care that is complete and accurate to the best of my ability. I acknowledge that Practitioner's advice, recommendations, and/or decision may be based on factors not within their control, such as incomplete or inaccurate data provided by me or distortions of diagnostic images or specimens that may result from electronic transmissions. I understand that the practice of medicine is not an exact science and that Practitioner makes no warranties or guarantees regarding treatment outcomes. I acknowledge that a copy of this consent can be made available to me via my patient portal ([facility]), or I can request a printed copy by calling the office of [facility].    I understand that my insurance will be billed for this visit.   I have read or had this consent read to me. I understand the contents of this consent, which adequately explains the benefits and risks of the Services being provided via telemedicine.  I have been provided ample opportunity to ask questions regarding this consent and the Services and have had my questions answered to my satisfaction. I give my informed consent for the services to be provided through the use of telemedicine in my medical care

## 2022-09-08 NOTE — Telephone Encounter (Signed)
Pam of [facility] is following up. She states she spoke with the patient and informed him that our pre-op department has made several attempts to contact him. Pam informed the patient that it is important that he answers so that his clearance request may be processed in time. Pam states that the patient will be expecting another call from our office. Pam confirmed that his primary contact # on file [phone]) is his best method of contact. Please follow up with [facility] with updates.

## 2022-09-08 NOTE — Telephone Encounter (Signed)
Pt has been scheduled for a tele visit, 09/19/22 2:20.  Consent on file / medications reconciled.

## 2022-09-19 ENCOUNTER — Ambulatory Visit: Payer: Medicare Other | Attending: Cardiology | Admitting: Physician Assistant

## 2022-09-19 DIAGNOSIS — Z01810 Encounter for preprocedural cardiovascular examination: Secondary | ICD-10-CM

## 2022-09-19 DIAGNOSIS — Z01818 Encounter for other preprocedural examination: Secondary | ICD-10-CM

## 2022-09-19 NOTE — Telephone Encounter (Signed)
Left message for the pt per pre op app pt missed his tele appt today, needs to reschedule.

## 2022-09-19 NOTE — Progress Notes (Signed)
Virtual Visit via Telephone Note   Because of Russell Trevino's co-morbid illnesses, he is at least at moderate risk for complications without adequate follow up.  This format is felt to be most appropriate for this patient at this time.  The patient did not have access to video technology/had technical difficulties with video requiring transitioning to audio format only (telephone).  All issues noted in this document were discussed and addressed.  No physical exam could be performed with this format.  Please refer to the patient's chart for his consent to telehealth for [facility].  Evaluation Performed:  Preoperative cardiovascular risk assessment _____________   Date:  09/19/2022   Patient ID:  Russell Trevino, DOB March 02, 1951, MRN [id number] Patient Location:  Home Provider location:   Office  Primary Care Provider:  Binnie Rail, MD Primary Cardiologist:  Buford Dresser, MD  Chief Complaint / Patient Profile   72 y.o. y/o male with a h/o CAD status post prior CABG, NSTEMI 10/2018, ischemic cardiomyopathy with a EF 40 to 45% who is pending gold seed markers and spacer placement and prostate and presents today for telephonic preoperative cardiovascular risk assessment.  History of Present Illness    Called x 2 and left VM x 2. Patient will be rescheduled.  Okay to hold Plavix x 5 days and aspirin x 7 days.  Please resume medically safe to do so.  Past Medical History    Past Medical History:  Diagnosis Date   BPH (benign prostatic hypertrophy)    Cardiomyopathy, ischemic    Coronary artery disease    at [facility], [place] 2009 (GRAFTS PATENT), cath 05/2018 w/ SVG-PDA 100%>>med rx   Diabetes mellitus, type 2 (HCC)    Frequency of urination    GERD (gastroesophageal reflux disease)    Gross hematuria    History of CHF (congestive heart failure) 2007   History of kidney stones    History of myocardial infarction 2006, 2019   2006>>CABG, 2019 cath w/ med rx    History of urinary retention    Hyperlipidemia    Nocturia    Renal calculi    BILATERAL PER CT SCAN--  NON-OBSTRUCTIVE   S/P CABG x 4 2006   Past Surgical History:  Procedure Laterality Date   CARDIAC CATHETERIZATION  01/17/2008   GRAFTS PATENT / EF 40%,  DR SANTOS ([facility])   CORONARY ARTERY BYPASS GRAFT  04/2005   LIMA-LAD, L radial-OM-D1 and SVG-PDA   CYSTO / RIGHT URETERAL STENT PLACEMENT  10-08-1999   CYSTO/  REMOVAL BLADDER STONES  01-14-2010   CYSTOSCOPY  10/16/2012   Procedure: CYSTOSCOPY;  Surgeon: Malka So, MD;  Location: [facility];  Service: Urology;  Laterality: N/A;  Cystoscopy with Clot Evacuation and fulguration bladder neck.   EXTRACORPOREAL SHOCK WAVE LITHOTRIPSY  10-15-2009   RIGHT   KNEE ARTHROSCOPY  1978   RIGHT   LEFT HEART CATH AND CORS/GRAFTS ANGIOGRAPHY N/A 12/17/2021   Procedure: LEFT HEART CATH AND CORS/GRAFTS ANGIOGRAPHY;  Surgeon: [name], MD;  Location: [facility];  Service: Cardiovascular;  Laterality: N/A;   LEFT SHOULDER SURGERY   2002   LUMBAR FUSION  09-10-2008   L4 - L5   PERCUTANEOUS NEPHROSTOLITHOTOMY  1979   PROSTATE BIOPSY  10/16/2012   Procedure: PROSTATE BIOPSY;  Surgeon: Malka So, MD;  Location: [facility];  Service: Urology;  Laterality: N/A;  Through ultrasound.   RIGHT/LEFT HEART CATH AND CORONARY/GRAFT ANGIOGRAPHY N/A  10/26/2018   Procedure: RIGHT/LEFT HEART CATH AND CORONARY/GRAFT ANGIOGRAPHY;  Surgeon: Martinique, Peter M, MD;  Location: [facility];  Service: Cardiovascular;  Laterality: N/A;   SHOULDER ARTHROSCOPY W/ SUBACROMIAL DECOMPRESSION AND DISTAL CLAVICLE EXCISION  10-11-2004   AND PARTIAL ROTATOR CUFF REPAIR   TRANSTHORACIC ECHOCARDIOGRAM  54/05/8118   LV SYSTOLIC FUNCTION MILDLY REDUCED/ EF 48%/ LV FILLING PATTERN  IS IMPAIRED/ HYPOKINESIS IN THE MID AND BASALAR SEPTUM & ANTEROSEPTUM   TURP VAPORIZATION  01-19-2010   BPH W/ BOO   URETEROLITHOTOMY  1976     Allergies  No Known Allergies  Home Medications    Prior to Admission medications   Medication Sig Start Date End Date Taking? Authorizing Provider  amLODipine (NORVASC) 5 MG tablet Take 1 tablet by mouth daily. 06/23/22 07/28/23  [provider]  aspirin 81 MG EC tablet Take 1 tablet (81 mg total) by mouth in the morning. 12/28/21   Loel Dubonnet, NP  atorvastatin (LIPITOR) 80 MG tablet Take 80 mg by mouth daily.    [provider]  carvedilol (COREG) 6.25 MG tablet Take 6.25 mg by mouth 2 (two) times daily with a meal. 10/17/17   [provider]  clopidogrel (PLAVIX) 75 MG tablet Take 1 tablet (75 mg total) by mouth daily. 04/15/22   Loel Dubonnet, NP  clotrimazole (LOTRIMIN) 1 % cream Apply 1 Application topically 2 (two) times daily. 05/31/18   [provider]  Continuous Blood Gluc Receiver (FREESTYLE LIBRE 2 READER) DEVI by Does not apply route. 06/23/22 09/21/22  [provider]  Continuous Blood Gluc Sensor (FREESTYLE LIBRE 2 SENSOR) MISC by Does not apply route. 06/23/22 09/21/22  [provider]  cyclobenzaprine (FLEXERIL) 10 MG tablet Take 10 mg by mouth at bedtime as needed for muscle spasms.    [provider]  esomeprazole (NEXIUM) 40 MG capsule Take 40 mg by mouth every morning. 04/27/22   [provider]  ezetimibe (ZETIA) 10 MG tablet Take 10 mg by mouth daily.    [provider]  finasteride (PROSCAR) 5 MG tablet TAKE 1 TABLET(5 MG) BY MOUTH DAILY Patient taking differently: Take 5 mg by mouth at bedtime. 10/15/20   Binnie Rail, MD  fluconazole (DIFLUCAN) 150 MG tablet Take 150 mg by mouth once. 05/31/18   [provider]  furosemide (LASIX) 40 MG tablet Take 40 mg by mouth as needed.    [provider]  insulin glargine (LANTUS SOLOSTAR) 100 UNIT/ML Solostar Pen Inject 30 Units into the skin 2 (two) times daily. 12/24/21   Binnie Rail, MD  Insulin Pen Needle 31G X 6 MM  MISC UAD for insulin pen 12/24/21   Binnie Rail, MD  Insulin Syringe-Needle U-100 (BD VEO INSULIN SYRINGE U/F) 31G X 15/64" 1 ML MISC use as directed TO INJECT INSULIN  twice a day 07/12/21   Binnie Rail, MD  isosorbide mononitrate (IMDUR) 120 MG 24 hr tablet Take 1 tablet (120 mg total) by mouth 2 (two) times daily. 12/28/21   Loel Dubonnet, NP  metoprolol succinate (TOPROL-XL) 50 MG 24 hr tablet Take 1 tablet (50 mg total) by mouth daily at 12 noon. Take with or immediately following a meal. Patient not taking: Reported on 09/08/2022 12/28/21   Loel Dubonnet, NP  nitroGLYCERIN (NITROSTAT) 0.4 MG SL tablet PLACE 1 TABLET UNDER THE TONGUE EVERY 5 MINS FOR CHEST FOR PAIN 05/18/22   Binnie Rail, MD  rosuvastatin (CRESTOR) 40 MG  tablet Take 1 tablet (40 mg total) by mouth daily. 12/28/21   Loel Dubonnet, NP  sacubitril-valsartan (ENTRESTO) 49-51 MG Take 1 tablet by mouth 2 (two) times daily. 12/28/21   Loel Dubonnet, NP  Semaglutide, 1 MG/DOSE, (OZEMPIC, 1 MG/DOSE,) 4 MG/3ML SOPN Inject 1 mg into the skin once a week.    [provider]    Physical Exam    Vital Signs:  Russell Trevino does not have vital signs available for review today.  Given telephonic nature of communication, physical exam is limited. AAOx3. NAD. Normal affect.  Speech and respirations are unlabored.  Accessory Clinical Findings    None  Assessment & Plan    1.  Preoperative Cardiovascular Risk Assessment   Elgie Collard, PA-C  09/19/2022, 8:36 AM

## 2022-09-20 ENCOUNTER — Encounter: Primary: Emergency Medicine

## 2022-09-20 NOTE — Progress Notes (Signed)
RN left voicemail for patient to call back to assess any navigation needs.   Plan of care in progress.

## 2022-09-20 NOTE — Progress Notes (Signed)
Formatting of this note might be different from the original.  RN left voicemail for patient to call back to assess any navigation needs.     Plan of care in progress.   Electronically signed by Rennis Harding, RN at 09/20/2022  3:48 PM EST

## 2022-09-23 ENCOUNTER — Encounter: Admit: 2022-09-23 | Discharge: 2022-09-23 | Payer: MEDICARE | Primary: Emergency Medicine

## 2022-09-23 DIAGNOSIS — E7800 Pure hypercholesterolemia, unspecified: Secondary | ICD-10-CM

## 2022-09-23 DIAGNOSIS — E119 Type 2 diabetes mellitus without complications: Secondary | ICD-10-CM | POA: Diagnosis not present

## 2022-09-23 DIAGNOSIS — I10 Essential (primary) hypertension: Secondary | ICD-10-CM | POA: Diagnosis not present

## 2022-09-24 LAB — COMPREHENSIVE METABOLIC PANEL
ALT: 43 U/L (ref 0–50)
AST: 37 U/L (ref 0–50)
Albumin/Globulin Ratio: 1.7 (ref 1.00–2.70)
Albumin: 4 g/dL (ref 3.5–5.2)
Alk Phosphatase: 83 U/L (ref 40–130)
Anion Gap: 11 mmol/L (ref 2–17)
BUN: 23 mg/dL (ref 8–23)
CO2: 25 mmol/L (ref 22–29)
Calcium: 9.4 mg/dL (ref 8.8–10.2)
Chloride: 103 mmol/L (ref 98–107)
Creatinine: 1.8 mg/dL — ABNORMAL HIGH (ref 0.7–1.3)
Est, Glom Filt Rate: 40 mL/min/1.73m — ABNORMAL LOW (ref >=60)
Globulin: 2.3 g/dL (ref 1.9–4.4)
Glucose: 149 mg/dL — ABNORMAL HIGH (ref 70–99)
OSMOLALITY CALCULATED: 284 mOsm/kg (ref 270–287)
Potassium: 4.7 mmol/L (ref 3.5–5.3)
Sodium: 139 mmol/L (ref 135–145)
Total Bilirubin: 1.29 mg/dL — ABNORMAL HIGH (ref 0.00–1.20)
Total Protein: 6.3 g/dL — ABNORMAL LOW (ref 6.4–8.3)

## 2022-09-24 LAB — MICROSCOPIC URINALYSIS
Amorphous, UA: NONE SEEN /HPF
MUCUS, URINE: NONE SEEN /LPF
Squam Epithel, UA: NONE SEEN /LPF

## 2022-09-24 LAB — CBC WITH AUTO DIFFERENTIAL
Absolute Baso #: 0 10*3/uL (ref 0.0–0.2)
Absolute Eos #: 0.2 10*3/uL (ref 0.0–0.5)
Absolute Lymph #: 1.7 10*3/uL (ref 1.0–3.2)
Absolute Mono #: 0.6 10*3/uL (ref 0.3–1.0)
Basophils %: 0.7 % (ref 0.0–2.0)
Eosinophils %: 3.4 % (ref 0.0–7.0)
Hematocrit: 40.1 % (ref 38.0–52.0)
Hemoglobin: 14 g/dL (ref 13.0–17.3)
Immature Grans (Abs): 0.02 10*3/uL (ref 0.00–0.06)
Immature Granulocytes: 0.3 % (ref 0.0–0.6)
Lymphocytes: 27.4 % (ref 15.0–45.0)
MCH: 30.6 pg (ref 27.0–34.5)
MCHC: 34.9 g/dL (ref 32.0–36.0)
MCV: 87.6 fL (ref 84.0–100.0)
MPV: 11.5 fL (ref 7.2–13.2)
Monocytes: 9.1 % (ref 4.0–12.0)
NRBC Absolute: 0 10*3/uL (ref 0.000–0.012)
NRBC Automated: 0 % (ref 0.0–0.2)
Neutrophils %: 59.1 % (ref 42.0–74.0)
Neutrophils Absolute: 3.6 10*3/uL (ref 1.6–7.3)
Platelets: 154 10*3/uL (ref 140–440)
RBC: 4.58 x10e6/mcL (ref 4.00–5.60)
RDW: 13.1 % (ref 11.0–16.0)
WBC: 6.1 10*3/uL (ref 3.8–10.6)

## 2022-09-24 LAB — URINALYSIS W/ RFLX MICROSCOPIC
Bilirubin Urine: NEGATIVE
Glucose, UA: 250 mg/dL — AB
Ketones, Urine: NEGATIVE
Leukocyte Esterase, Urine: NEGATIVE
Nitrite, Urine: NEGATIVE
Protein, UA: 100 — AB
Specific Gravity, UA: 1.025 (ref 1.003–1.035)
Urobilinogen, Urine: 0.2 EU/dL (ref >1.0)
pH, UA: 6 (ref 4.5–8.0)

## 2022-09-24 LAB — LIPID PANEL
Chol/HDL Ratio: 3.8 (ref 0.0–4.4)
Cholesterol: 176 mg/dL (ref 100–200)
HDL: 46 mg/dL (ref >=40)
LDL Cholesterol: 92.8 mg/dL (ref 0.0–100.0)
LDL/HDL Ratio: 2
Triglycerides: 186 mg/dL — ABNORMAL HIGH (ref 0–149)
VLDL: 37.2 mg/dL (ref 5.0–40.0)

## 2022-09-24 LAB — HEMOGLOBIN A1C
Est. Avg. Glucose, WB: 298
Est. Avg. Glucose-calculated: 350
Hemoglobin A1C: 12 % — ABNORMAL HIGH (ref 4.0–6.0)

## 2022-09-24 LAB — PSA, DIAGNOSTIC: PSA: 2.37 ng/mL (ref 0.000–4.000)

## 2022-09-24 LAB — TSH WITH REFLEX: TSH: 2.78 mcIU/mL (ref 0.358–3.740)

## 2022-09-24 LAB — MICROALBUMIN / CREATININE URINE RATIO
Creatinine, Ur: 148 mg/dL (ref 39.0–259.0)
Microalb, Ur: 93.7 mg/dL — ABNORMAL HIGH (ref 0.0–20.0)
Microalbumin Creatinine Ratio: 633 mg/g Cr — ABNORMAL HIGH (ref 0–30)

## 2022-09-26 ENCOUNTER — Encounter: Attending: Emergency Medicine | Primary: Emergency Medicine

## 2022-09-26 NOTE — Telephone Encounter (Signed)
-----  Message from Karen Kitchens, MD sent at 09/25/2022  7:10 AM EST -----  Regarding: Aic 12  Have patient ck BS q 6 hrs and record and bring to next appt.  Thanks,  Dr. Johnette Abraham  ----- Message -----  From: Edi, [facility]  Sent: 09/23/2022   7:40 PM EST  To: Karen Kitchens, MD

## 2022-09-27 ENCOUNTER — Ambulatory Visit
Admit: 2022-09-27 | Discharge: 2022-09-27 | Payer: MEDICARE | Attending: Emergency Medicine | Primary: Emergency Medicine

## 2022-09-27 DIAGNOSIS — C61 Malignant neoplasm of prostate: Secondary | ICD-10-CM

## 2022-09-27 DIAGNOSIS — I5020 Unspecified systolic (congestive) heart failure: Secondary | ICD-10-CM | POA: Diagnosis not present

## 2022-09-27 DIAGNOSIS — N1832 Chronic kidney disease, stage 3b: Secondary | ICD-10-CM | POA: Diagnosis not present

## 2022-09-27 DIAGNOSIS — E1165 Type 2 diabetes mellitus with hyperglycemia: Secondary | ICD-10-CM | POA: Diagnosis not present

## 2022-09-27 DIAGNOSIS — I25119 Atherosclerotic heart disease of native coronary artery with unspecified angina pectoris: Secondary | ICD-10-CM | POA: Diagnosis not present

## 2022-09-27 DIAGNOSIS — T182XXD Foreign body in stomach, subsequent encounter: Secondary | ICD-10-CM | POA: Diagnosis not present

## 2022-09-27 DIAGNOSIS — I129 Hypertensive chronic kidney disease with stage 1 through stage 4 chronic kidney disease, or unspecified chronic kidney disease: Secondary | ICD-10-CM | POA: Diagnosis not present

## 2022-09-27 DIAGNOSIS — I2510 Atherosclerotic heart disease of native coronary artery without angina pectoris: Secondary | ICD-10-CM | POA: Diagnosis not present

## 2022-09-27 DIAGNOSIS — E790 Hyperuricemia without signs of inflammatory arthritis and tophaceous disease: Secondary | ICD-10-CM | POA: Diagnosis not present

## 2022-09-27 DIAGNOSIS — I255 Ischemic cardiomyopathy: Secondary | ICD-10-CM | POA: Diagnosis not present

## 2022-09-27 DIAGNOSIS — R3129 Other microscopic hematuria: Secondary | ICD-10-CM | POA: Diagnosis not present

## 2022-09-27 DIAGNOSIS — E782 Mixed hyperlipidemia: Secondary | ICD-10-CM | POA: Diagnosis not present

## 2022-09-27 MED ORDER — SEMAGLUTIDE (2 MG/DOSE) 8 MG/3ML SC SOPN
8 MG/3ML | SUBCUTANEOUS | 3 refills | Status: AC
Start: 2022-09-27 — End: ?

## 2022-09-27 MED ORDER — ESZOPICLONE 2 MG PO TABS
2 MG | ORAL_TABLET | Freq: Every evening | ORAL | 1 refills | Status: AC
Start: 2022-09-27 — End: 2023-09-12

## 2022-09-27 NOTE — Progress Notes (Signed)
CHIEF COMPLAINT:  Chief Complaint   Patient presents with    Follow-up     3 month f/u       HPI:   Symptom(s):   Alfred Weiss is a pleasant  72 y.o. year-old patient with  history of: CAD/ischemic cardiomyopathy, poorly controlled DM/morbid obesity, CKD, HTN, mixed hyperlipidemia and prostate cancer Dx 2014-previous active surveillance planning radiation and see.  He presents for a scheduled 3 months follow-up appointment with labs.   Labs revealed worsening diabetic control A1c 12 despite increasing Ozempic to 1 mg last visit and remains on Lantus 30 units twice daily.  Patient denies any hospitalizations or ER visits since last office visit. Specialists since the last visit include: Urologist in [place] starting radiation end of this week for 8 weeks.  Patient also established with Dr. Romilda Garret and planning repeat echo (verify meds).  Noted higher creatinine/ordered recheck (patient declines Nephrology).  New complaints: Patient wondering if she could have a refill on Lunesta to take as needed.  Regarding CAD patient had four-vessel CABG in 2006 with most recent [place] 10/2018 multivessel disease treated medically remains on Entresto, Ranexa, Isosorbide and Amlodipine and Toprol.  No chest pain, shortness of breath or syncope  Overdue tests/specialists: Colonoscopy for positive Cologuard 02/26/2021, endocrine referral declined, nephrology referral declined  Patient continues to spend most of his time in [place] where his girlfriend is suffering from breast cancer on palliative therapy.  He did go to [facility] recently and won $20,000 playing poker  Additional chronic medical conditions managed include: GERD/gastric bezoar 2021, OA/right sciatica.  Normal follow-up every 3 months with labs.  Specialist(s):  Dr. Romilda Garret 08/23/2022 new echo and consider ICD  Dr. Charmaine Downs to follow-up history of gastric bezoar/ positive Cologuard 02/26/2021-colonoscopy not accomplished, EGD 12/16/2019 showed irregular  Z-line large gastric bezoar-lost to follow-up  Neurologist-previous intention tremor felt to be related to Metoclopramide medicine discontinued tremor persists  Endocrinologist-offered/declined  Nephrologist-offered/declined     Current Outpatient Medications   Medication Sig Note Dispense Refill    eszopiclone (LUNESTA) 2 MG TABS Take 1 tablet by mouth nightly for 350 days. Max Daily Amount: 2 mg  30 tablet 1    semaglutide, 2 MG/DOSE, (OZEMPIC) 8 MG/3ML SOPN sc injection Inject 0.75 mLs into the skin once a week  9 mL 3    gabapentin (NEURONTIN) 100 MG capsule Take 1 capsule by mouth 3 times daily. 09/27/2022: As needed      pantoprazole (PROTONIX) 40 MG tablet Take 1 tablet by mouth every morning (before breakfast)  90 tablet 3    sacubitril-valsartan (ENTRESTO) 49-51 MG per tablet 1 tablet Orally Twice a day  200 tablet 3    rosuvastatin (CRESTOR) 40 MG tablet 1 tablet Orally Once a day  100 tablet 3    ranolazine (RANEXA) 500 MG extended release tablet Take 1 tablet by mouth 2 times daily  200 tablet 3    nitroGLYCERIN (NITROSTAT) 0.4 MG SL tablet PLACE 1 TABLET UNDER THE TONGUE EVERY 5 MINS FOR CHEST FOR PAIN  100 tablet 3    metoprolol succinate (TOPROL XL) 50 MG extended release tablet Take 1 tablet by mouth daily At noon  100 tablet 3    LANTUS SOLOSTAR 100 UNIT/ML injection pen ADMINISTER 30 UNITS UNDER THE SKIN TWICE DAILY  5 Adjustable Dose Pre-filled Pen Syringe 3    isosorbide mononitrate (IMDUR) 120 MG extended release tablet Take 1 tablet by mouth 2 times daily  200 tablet 3  furosemide (LASIX) 40 MG tablet Take 1 tablet by mouth daily 09/27/2022: As needed 90 tablet 3    finasteride (PROSCAR) 5 MG tablet 1 tablet Orally Once per day at 12 noon  100 tablet 3    famotidine (PEPCID) 40 MG tablet 1 tablet at bedtime Orally Once a day for 90 days  90 tablet 3    ezetimibe (ZETIA) 10 MG tablet 1 tablet Orally Once a day  100 tablet 3    clopidogrel (PLAVIX) 75 MG tablet 1 tablet Orally Once per day  100  tablet 3    amLODIPine (NORVASC) 5 MG tablet Take 1 tablet by mouth daily  100 tablet 3    B-D UF III MINI PEN NEEDLES 31G X 5 MM MISC USE AS DIRECTED FOR INSULIN PEN  100 each 3    cyclobenzaprine (FLEXERIL) 10 MG tablet  09/27/2022: As needed      ASPIRIN LOW DOSE 81 MG EC tablet        clotrimazole (LOTRIMIN) 1 % cream Apply topically 2 times daily Apply topically 2 times daily. (Patient not taking: Reported on 09/27/2022)        No current facility-administered medications for this visit.     No Known Allergies  Family History: reviewed/updated as indicated  Social History: reviewed/updated as indicated  Past Medical History:   Diagnosis Date    CAD (coronary artery disease) 2006    s/p CABG and subsequent stent    CKD (chronic kidney disease) stage 3, GFR 30-59 ml/min (HCC)     Creatinine range 1.6-2.0    Elevated uric acid in blood     Range 6.1-7.5    GERD (gastroesophageal reflux disease)     EGD 2021 irregular Z-line, large gastric bezoar-Dr. Roanna Raider    Ischemic cardiomyopathy     Dr. Tamsen Roers, [place]/Dr. Romilda Garret    Medicare annual wellness visit, subsequent 06/23/2022    Labs 09/2022 A1c 12 (11.7, 9.2, 7.3), creatinine 2.0 (1.8), LDL 93 (80, 60)/TG 186 (351, 190)    Mixed hyperlipidemia     TG range [phone])/LDL range 60-190 (goal <70)    Poorly controlled type 1 diabetes mellitus with complication ([place]) 4696    HgmA1C range 7.3-11.7 (C-peptide 12.4 10/2019)    Primary hypertension     Prostate cancer ([place]) 2014    PSA 6.1(4.2) (prev opted for active surveillance)-Dr. Baldo Ash, [place]     Past Surgical History:   Procedure Laterality Date    CARDIAC CATHETERIZATION Left 10/2018    LHC- non-stemi 95% LM, 100% LAD, 100% circumflex, 100% RCS lesion/no intervention    COLONOSCOPY  2003    normal, cologuard postive test 02/26/2021 - needs new colonoscopy    CORONARY ARTERY BYPASS GRAFT  2006    CABG x4 and multiple cardiac caths no stents    KIDNEY STONE REMOVAL  1978    KNEE SURGERY Left 1974     left knee open meniscus    LUMBAR FUSION  2009    L4-5    PROSTATE BIOPSY  2014    SHOULDER OPEN ROTATOR CUFF REPAIR Left 2006    2002 left shoulder impingment    TURP  2011    TURP vaporization           ROS:   General/Constitutional:   Patient denies fever, shakes or chills, excessive weight gain or weight loss.  Comments highest weight 10/2019 259 pounds, lowest 231 (current/8 pound weight loss past 3 months) 260 pounds-02/2021) updated 09/27/2022  Endocrine:  Patient denies excessive thirst, hunger or frequent urination.   Respiratory:   Patient denies cough, shortness of breath, wheezing or hemoptysis.   Cardiovascular:   Patient denies chest pain, palpatations or syncope.   Gastrointestinal:   Patient denies abdominal pain, nausea, vomiting or diarrhea, blood in stool.   Men Only:  Comments active surveillance for prostate cancer planning repeat biopsy by history  Musculoskeletal:   Patient denies leg swelling, painful or swollen joints.   Skin:   Patient denies rash.   Neurologic:   Patient denies dizziness, loss of use of extremity or focal neurologic deficit.   Psychiatric:   Patient denies anxiety, depression or suicidal thoughts.    Vital signs: BP 138/77 (Site: Left Upper Arm, Position: Sitting, Cuff Size: Large Adult)   Pulse 63   Temp 98.4 F (36.9 C) (Temporal)   Resp 16   Wt 105 kg (231 lb 7.7 oz)   SpO2 100%   BMI 31.39 kg/m       Physical Examination:   GENERAL APPEARANCE: well developed and well nourished in NAD.   EYES: PERRLA, EOMI conjunctiva clear and sclera nonicteric.   EARS: canals clear and TM's are normal.   NOSE: Bilateral nares are patent with normal inferior turbinates without drainage, erythema or masses.   THROAT: MMM, pharynx normal without erythema or exudate.   NECK: supple with FROM and no thyromegaly or mass, -carotid bruit or thrill .   HEART: RRR without murmurs, clicks or rubs.   LUNGS: CTA in all fields without rales, whezes or rubs.   ABDOMEN: soft with positive  BS, nontender, no hepatosplenomegaly, no guarding or rebound and no masses or hernias.   MALE GENITOURINARY: not examined.   RECTAL EXAM: not examined.   SKIN: warm and dry without rash or suspicious lesions.   EXTREMITIES: FROM without clubbing, cyanosis or edema.   BACK: FROM, no point tenderness, no bony stepoff or crepitus.   VASCULAR: 2+ bilateral radial pulses, 2+ dorsalis pedis and posterior tibial pulses bilaterally-06/23/2022.   NEUROLOGIC: CN II - XII normal, motor strength 5/5 throughout and sensory exam intact.   PSYCH: cognitive function intact, judgement and insight good, mood/affect full range.     Assessment/Plan:  1. Microscopic hematuria  Comments:  Recent labs 21-50 RBC/hpf follow  2. Prostate cancer ([place])  -     PSA, Serial; Future  3. Ischemic cardiomyopathy  4. Coronary artery disease involving native coronary artery of native heart with angina pectoris ([place])  5. Controlled type 2 diabetes mellitus with hyperglycemia, with long-term current use of insulin (HCC)  -     Hemoglobin A1C; Future  -     Microalbumin / Creatinine Urine Ratio; Future  -     Hemoglobin A1C; Future  6. Mixed hyperlipidemia  -     CBC with Auto Differential; Future  -     Comprehensive Metabolic Panel; Future  -     Lipid Panel; Future  -     TSH with Reflex; Future  -     Lipid Panel; Future  -     Comprehensive Metabolic Panel; Future  -     CBC with Auto Differential; Future  7. Essential hypertension  -     Urinalysis W/ Rflx Microscopic; Future  8. Stage 3b chronic kidney disease ([place])  9. Gastric bezoar, subsequent encounter  10. Gastroesophageal reflux disease without esophagitis  11. Elevated uric acid in blood  12. Medically noncompliant  13. Coronary artery disease involving native  coronary artery of native heart without angina pectoris  -     eszopiclone (LUNESTA) 2 MG TABS; Take 1 tablet by mouth nightly for 350 days. Max Daily Amount: 2 mg, Disp-30 tablet, R-1Normal  14. HFrEF (heart failure with reduced  ejection fraction) (HCC)  -     eszopiclone (LUNESTA) 2 MG TABS; Take 1 tablet by mouth nightly for 350 days. Max Daily Amount: 2 mg, Disp-30 tablet, R-1Normal        Time documentation as follows: 20 minutes reviewing old records, labs and previous visit note, 20 minutes discussing history of present illness/complaint as well as discussing treatment and plan, 5 minutes performing pertinent exam, 15 minutes documenting chart for total time of 60 minutes

## 2022-09-28 NOTE — Progress Notes (Signed)
Reviewed pt chart for pre-op interview for surgery scheduled on 10-06-2022 by Dr Alinda Money.  Noted pt last echo 12-17-2021  ef 20-25%.  Pt is not candidate for [facility] due to anesthesia guidelines.  Called and spoke w/ anesthesia, Dr Gifford Shave MDA,  stated pt does need to be moved to main OR.  Called and left message for Pam, OR scheduler, informed her pt needs to be moved to main and why.

## 2022-10-03 NOTE — Patient Instructions (Signed)
SURGICAL WAITING ROOM VISITATION  Patients having surgery or a procedure may have no more than 2 support people in the waiting area - these visitors may rotate.    Children under the age of 63 must have an adult with them who is not the patient.  Due to an increase in RSV and influenza rates and associated hospitalizations, children ages 69 and under may not visit patients in [place].  If the patient needs to stay at the hospital during part of their recovery, the visitor guidelines for inpatient rooms apply. Pre-op nurse will coordinate an appropriate time for 1 support person to accompany patient in pre-op.  This support person may not rotate.    Please refer to the [facility] website for the visitor guidelines for Inpatients (after your surgery is over and you are in a regular room).       Your procedure is scheduled on: 10-06-21   Report to [facility] Main Entrance    Report to admitting at      [place]  AM   Call this number if you have problems the morning of surgery [phone]   Do not eat food or drink liquids :After Midnight.     ONE FLEETS ENEMA THE NIGHT BEFORE SURGERY  FOLLOW BOWEL PREP AND ANY ADDITIONAL PRE OP INSTRUCTIONS YOU RECEIVED FROM YOUR SURGEON'S OFFICE!!!     Oral Hygiene is also important to reduce your risk of infection.                                    Remember - BRUSH YOUR TEETH THE MORNING OF SURGERY WITH YOUR REGULAR TOOTHPASTE  DENTURES WILL BE REMOVED PRIOR TO SURGERY PLEASE DO NOT APPLY "Poly grip" OR ADHESIVES!!!   Do NOT smoke after Midnight   Take these medicines the morning of surgery with A SIP OF WATER: ROSUVASTATIN, RANOLAZINE, PANTOPRAZOLE, ISORBIDE, METOPROLOL, EZETIMIBE, AMLODIPINE   These are anesthesia recommendations for holding your anticoagulants.  Please contact your prescribing physician to confirm IF it is safe to hold your anticoagulants for this length of time.   Eliquis Apixaban   72 hours    Xarelto Rivaroxaban   72 hours  Plavix Clopidogrel   120 hours  Pletal Cilostazol   120 hours   DO NOT TAKE ANY ORAL DIABETIC MEDICATIONS DAY OF YOUR SURGERY How to Manage Your Diabetes Before and After Surgery  Why is it important to control my blood sugar before and after surgery? Improving blood sugar levels before and after surgery helps healing and can limit problems. A way of improving blood sugar control is eating a healthy diet by:  Eating less sugar and carbohydrates  Increasing activity/exercise  Talking with your doctor about reaching your blood sugar goals High blood sugars (greater than 180 mg/dL) can raise your risk of infections and slow your recovery, so you will need to focus on controlling your diabetes during the weeks before surgery. Make sure that the doctor who takes care of your diabetes knows about your planned surgery including the date and location.  How do I manage my blood sugar before surgery? Check your blood sugar at least 4 times a day, starting 2 days before surgery, to make sure that the level is not too high or low. Check your blood sugar the morning of your surgery when you wake up and every 2 hours until you get to the Short Stay  unit. If your blood sugar is less than 70 mg/dL, you will need to treat for low blood sugar: Do not take insulin. Treat a low blood sugar (less than 70 mg/dL) with  cup of clear juice (cranberry or apple), 4 glucose tablets, OR glucose gel. Recheck blood sugar in 15 minutes after treatment (to make sure it is greater than 70 mg/dL). If your blood sugar is not greater than 70 mg/dL on recheck, call [phone] for further instructions. Report your blood sugar to the short stay nurse when you get to Short Stay.  If you are admitted to the hospital after surgery: Your blood sugar will be checked by the staff and you will probably be given insulin after surgery (instead of oral diabetes medicines) to make sure you have good  blood sugar levels. The goal for blood sugar control after surgery is 80-180 mg/dL.   WHAT DO I DO ABOUT MY DIABETES MEDICATION?  Do not take oral diabetes medicines (pills) the morning of surgery.  THE NIGHT BEFORE SURGERY, take  50% OF NORMAL DOSE OF LANTUS INSULIN          THE MORNING OF SURGERY, take  TAKE 50% OF NORMAL DOSE OF LANTUS INSULIN   DO NOT TAKE THE FOLLOWING 7 DAYS PRIOR TO SURGERY: Ozempic, Wegovy, Rybelsus (Semaglutide), Byetta (exenatide), Bydureon (exenatide ER), Victoza, Saxenda (liraglutide), or Trulicity (dulaglutide) Mounjaro (Tirzepatide) Adlyxin (Lixisenatide), Polyethylene Glycol Loxenatide.  Bring CPAP mask and tubing day of surgery.                              You may not have any metal on your body including hair pins, jewelry, and body piercing             Do not wear  lotions, powders, perfumes/cologne, or deodorant                Men may shave face and neck.   Do not bring valuables to the hospital. [place].   Contacts, glasses, dentures or bridgework may not be worn into surgery.   Bring small overnight bag day of surgery.   DO NOT [place]. PHARMACY WILL DISPENSE MEDICATIONS LISTED ON YOUR MEDICATION LIST TO YOU DURING YOUR ADMISSION [place]!    Patients discharged on the day of surgery will not be allowed to drive home.  Someone NEEDS to stay with you for the first 24 hours after anesthesia.   Special Instructions: Bring a copy of your healthcare power of attorney and living will documents the day of surgery if you haven't scanned them before.              Please read over the following fact sheets you were given: IF [place] [phone]   If you received a COVID test during your pre-op visit  it is requested that you wear a mask when out in public, stay away from anyone that may not be  feeling well and notify your surgeon if you develop symptoms. If you test positive for Covid or have been in contact with anyone that has tested positive in the last 10 days please notify you surgeon.    [place] - Preparing for Surgery Before surgery, you can play an important role.  Because  skin is not sterile, your skin needs to be as free of germs as possible.  You can reduce the number of germs on your skin by washing with CHG (chlorahexidine gluconate) soap before surgery.  CHG is an antiseptic cleaner which kills germs and bonds with the skin to continue killing germs even after washing. Please DO NOT use if you have an allergy to CHG or antibacterial soaps.  If your skin becomes reddened/irritated stop using the CHG and inform your nurse when you arrive at Short Stay. Do not shave (including legs and underarms) for at least 48 hours prior to the first CHG shower.  You may shave your face/neck. Please follow these instructions carefully:  1.  Shower with CHG Soap the night before surgery and the  morning of Surgery.  2.  If you choose to wash your hair, wash your hair first as usual with your  normal  shampoo.  3.  After you shampoo, rinse your hair and body thoroughly to remove the  shampoo.                           4.  Use CHG as you would any other liquid soap.  You can apply chg directly  to the skin and wash                       Gently with a scrungie or clean washcloth.  5.  Apply the CHG Soap to your body ONLY FROM THE NECK DOWN.   Do not use on face/ open                           Wound or open sores. Avoid contact with eyes, ears mouth and genitals (private parts).                       Wash face,  Genitals (private parts) with your normal soap.             6.  Wash thoroughly, paying special attention to the area where your surgery  will be performed.  7.  Thoroughly rinse your body with warm water from the neck down.  8.  DO NOT shower/wash with your normal soap after using and  rinsing off  the CHG Soap.                9.  Pat yourself dry with a clean towel.            10.  Wear clean pajamas.            11.  Place clean sheets on your bed the night of your first shower and do not  sleep with pets. Day of Surgery : Do not apply any lotions/deodorants the morning of surgery.  Please wear clean clothes to the hospital/surgery center.  FAILURE TO FOLLOW THESE INSTRUCTIONS MAY RESULT IN THE CANCELLATION OF YOUR SURGERY PATIENT SIGNATURE_________________________________  NURSE SIGNATURE__________________________________  ________________________________________________________________________

## 2022-10-03 NOTE — Progress Notes (Addendum)
PCP - Karen Kitchens LOV 09-27-22 epic Cardiologist -Dr. Harrell Gave LOV 09-19-22  preop eval Russell Rough PA-C  PPM/ICD -  Device Orders -  Rep Notified -   Chest x-ray - 12-16-21 epic EKG - 04-25-22 epic Stress Test -  ECHO - 12-17-21 epic Cardiac Cath - 12-17-21 epic HgbA1c- 09-23-22 epic 12.0 Cbc/diff, cmp1-12-24 epic creatine 1.8  Sleep Study -  CPAP -   Fasting Blood Sugar - 160's Checks Blood Sugar __2___ times a day  Blood Thinner Instructions:Plavix hold 5 days Aspirin Instructions:81 mg asa hold 7 days  ERAS Protcol - PRE-SURGERY G2-    COVID vaccine -yes Last dose of Semglatude was 09-27-22  pt advised not to take this weeks dose  Activity-- Able to climb a flight of stairs without SOB or CP Anesthesia review: HgbA1c 12.0, creatine 1.66 CAD,CABG,DM,HTN,MI,CHF, BS 360 at preop pt. Had not had his Lantus insulin and had waffle and syrup for lunch. Russell Billow Ward PA-C aware. Pt. Advised to watch sugar and carb intake and we discussed risk of cancellation DOS if BS is to high.  Patient denies shortness of breath, fever, cough and chest pain at PAT appointment   All instructions explained to the patient, with a verbal understanding of the material. Patient agrees to go over the instructions while at home for a better understanding. Patient also instructed to self quarantine after being tested for COVID-19. The opportunity to ask questions was provided.

## 2022-10-04 ENCOUNTER — Encounter (HOSPITAL_COMMUNITY)
Admission: RE | Admit: 2022-10-04 | Discharge: 2022-10-04 | Disposition: A | Discharge status: Home / Self Care | Payer: Medicare Other | Source: Ambulatory Visit | Attending: Urology | Admitting: Urology

## 2022-10-04 ENCOUNTER — Encounter (HOSPITAL_COMMUNITY): Payer: Self-pay

## 2022-10-04 ENCOUNTER — Other Ambulatory Visit: Payer: Self-pay

## 2022-10-04 VITALS — BP 131/69 | HR 68 | Temp 98.0°F | Resp 16 | Ht 72.0 in | Wt 224.0 lb

## 2022-10-04 DIAGNOSIS — I2510 Atherosclerotic heart disease of native coronary artery without angina pectoris: Secondary | ICD-10-CM | POA: Diagnosis not present

## 2022-10-04 DIAGNOSIS — I130 Hypertensive heart and chronic kidney disease with heart failure and stage 1 through stage 4 chronic kidney disease, or unspecified chronic kidney disease: Secondary | ICD-10-CM | POA: Diagnosis not present

## 2022-10-04 DIAGNOSIS — E1122 Type 2 diabetes mellitus with diabetic chronic kidney disease: Secondary | ICD-10-CM | POA: Insufficient documentation

## 2022-10-04 DIAGNOSIS — I255 Ischemic cardiomyopathy: Secondary | ICD-10-CM | POA: Insufficient documentation

## 2022-10-04 DIAGNOSIS — C61 Malignant neoplasm of prostate: Secondary | ICD-10-CM | POA: Insufficient documentation

## 2022-10-04 DIAGNOSIS — Z01812 Encounter for preprocedural laboratory examination: Secondary | ICD-10-CM | POA: Diagnosis not present

## 2022-10-04 DIAGNOSIS — Z794 Long term (current) use of insulin: Secondary | ICD-10-CM | POA: Diagnosis not present

## 2022-10-04 DIAGNOSIS — Z951 Presence of aortocoronary bypass graft: Secondary | ICD-10-CM | POA: Diagnosis not present

## 2022-10-04 DIAGNOSIS — N1832 Chronic kidney disease, stage 3b: Secondary | ICD-10-CM | POA: Insufficient documentation

## 2022-10-04 HISTORY — DX: Heart failure, unspecified: I50.9

## 2022-10-04 HISTORY — DX: Essential (primary) hypertension: I10

## 2022-10-04 HISTORY — DX: Malignant (primary) neoplasm, unspecified: C80.1

## 2022-10-04 HISTORY — DX: Acute myocardial infarction, unspecified: I21.9

## 2022-10-04 LAB — BASIC METABOLIC PANEL
Anion gap: 8 (ref 5–15)
BUN: 25 mg/dL — ABNORMAL HIGH (ref 8–23)
CO2: 23 mmol/L (ref 22–32)
Calcium: 8.6 mg/dL — ABNORMAL LOW (ref 8.9–10.3)
Chloride: 101 mmol/L (ref 98–111)
Creatinine, Ser: 1.66 mg/dL — ABNORMAL HIGH (ref 0.61–1.24)
GFR, Estimated: 44 mL/min — ABNORMAL LOW (ref >60)
Glucose, Bld: 371 mg/dL — ABNORMAL HIGH (ref 70–99)
Potassium: 4.8 mmol/L (ref 3.5–5.1)
Sodium: 132 mmol/L — ABNORMAL LOW (ref 135–145)

## 2022-10-04 LAB — GLUCOSE, CAPILLARY: Glucose-Capillary: 360 mg/dL — ABNORMAL HIGH (ref 70–99)

## 2022-10-05 ENCOUNTER — Telehealth (HOSPITAL_BASED_OUTPATIENT_CLINIC_OR_DEPARTMENT_OTHER): Payer: Self-pay | Admitting: Cardiology

## 2022-10-05 ENCOUNTER — Encounter: Payer: Self-pay | Admitting: Nurse Practitioner

## 2022-10-05 ENCOUNTER — Ambulatory Visit: Payer: Medicare Other | Attending: Nurse Practitioner | Admitting: Nurse Practitioner

## 2022-10-05 DIAGNOSIS — Z01810 Encounter for preprocedural cardiovascular examination: Secondary | ICD-10-CM | POA: Diagnosis not present

## 2022-10-05 NOTE — Progress Notes (Addendum)
Anesthesia Chart Review   Case: [id number] Date/Time: 10/06/22 1145   Procedures:      GOLD SEED IMPLANT     SPACE OAR INSTILLATION - 30 MINS FOR CASE   Anesthesia type: Monitor Anesthesia Care   Pre-op diagnosis: PROSTATE CANCER   Location: [facility] ROOM 07 / [facility]   Surgeons: Raynelle Bring, MD       DISCUSSION:72 y.o. never smoker with h/o HTN, ischemic cardiomyopathy, CAD (CABG, NSTEMI 2020), DM II, CKD, prostate cancer scheduled for above procedure 10/06/2022 with Dr. Raynelle Bring.   Poorly controlled DM. Elevated glucose at PAT, 371 non-fasting.  Most recent A1C 12.0.  Discussed with Dr. Lynne Logan office.  Requested cardiac clearance. Most recent EF 20-25%.  Per last cardiology OV note ICD to be discussed at next visit.   Addendum 10/05/2022:  Per 10/05/22 cardiology preoperative evaluation, "According to the Revised Cardiac Risk Index (RCRI), his Perioperative Risk of Major Cardiac Event is (%): 6.6. His Functional Capacity in METs is: 7.99 according to the Duke Activity Status Index (DASI). Pt has a history of reduced EF, he was stable at last OV, he is stable on upon assessment today with no new symptoms or concerns.Therefore, based on ACC/AHA guidelines, patient would be at acceptable risk for the planned procedure without further cardiovascular testing.     The patient was advised that if he develops new symptoms prior to surgery to contact our office to arrange for a follow-up visit, and he verbalized understanding. Otherwise, he may follow-up as planned in 11/2022.   Per office protocol, he may hold Plavix x 5 days and aspirin x 7 days prior to procedure. Please resume when medically safe to do so." VS: BP 131/69   Pulse 68   Temp 36.7 C (Oral)   Resp 16   Ht 6' (1.829 m)   Wt 101.6 kg   SpO2 100%   BMI 30.38 kg/m   PROVIDERS: Binnie Rail, MD is PCP   Primary Cardiologist:  Buford Dresser, MD  LABS:  glucose elevated, forwarded to PCP and surgeon (all labs  ordered are listed, but only abnormal results are displayed)  Labs Reviewed  BASIC METABOLIC PANEL - Abnormal; Notable for the following components:      Result Value   Sodium 132 (*)    Glucose, Bld 371 (*)    BUN 25 (*)    Creatinine, Ser 1.66 (*)    Calcium 8.6 (*)    GFR, Estimated 44 (*)    All other components within normal limits  GLUCOSE, CAPILLARY - Abnormal; Notable for the following components:   Glucose-Capillary 360 (*)    All other components within normal limits     IMAGES:   EKG:   CV: Cardiac Cath 12/17/2021   Mid LM to Dist LM lesion is 100% stenosed.   Prox LAD lesion is 100% stenosed.   Prox Cx lesion is 100% stenosed.   Prox RCA to Dist RCA lesion is 100% stenosed.   Dist LAD lesion is 60% stenosed.   LV end diastolic pressure is normal.   1.  Patent LIMA to LAD with radial off the LIMA graft to diagonal and obtuse marginal branches. 2.  Vein graft right coronary artery known occluded and not imaged. 3.  Severe native vessel disease with known occlusion of right coronary artery which was not imaged on the study and newly occluded left main lesion. 4.  LVEDP of 16 mmHg.  Echo 12/17/2021 1. Left ventricular ejection fraction, by  estimation, is 20 to 25%. The  left ventricle has severely decreased function. The left ventricle  demonstrates regional wall motion abnormalities (see scoring  diagram/findings for description). The left  ventricular internal cavity size was moderately dilated. Left ventricular  diastolic parameters are indeterminate.   2. Right ventricular systolic function is moderately reduced. The right  ventricular size is normal.   3. Left atrial size was mildly dilated.   4. The mitral valve is grossly normal. Trivial mitral valve  regurgitation. No evidence of mitral stenosis.   5. The aortic valve was not well visualized. Aortic valve regurgitation  is not visualized. No aortic stenosis is present.   6. Aortic dilatation noted.  There is mild dilatation of the ascending  aorta, measuring 41 mm.  Past Medical History:  Diagnosis Date   BPH (benign prostatic hypertrophy)    Cancer (HCC)    prostate   Cardiomyopathy, ischemic    CHF (congestive heart failure) ([place])    Coronary artery disease    at [facility], [place] 2009 (GRAFTS PATENT), cath 05/2018 w/ SVG-PDA 100%>>med rx   Diabetes mellitus, type 2 (HCC)    Frequency of urination    GERD (gastroesophageal reflux disease)    Gross hematuria    History of CHF (congestive heart failure) 2007   History of kidney stones    History of myocardial infarction 2006, 2019   2006>>CABG, 2019 cath w/ med rx   History of urinary retention    Hyperlipidemia    Hypertension    Myocardial infarction ([place])    2006   Nocturia    S/P CABG x 4 2006    Past Surgical History:  Procedure Laterality Date   CARDIAC CATHETERIZATION  01/17/2008   GRAFTS PATENT / EF 40%,  DR SANTOS ([facility])   CORONARY ARTERY BYPASS GRAFT  04/2005   LIMA-LAD, L radial-OM-D1 and SVG-PDA   CYSTO / RIGHT URETERAL STENT PLACEMENT  10-08-1999   CYSTO/  REMOVAL BLADDER STONES  01-14-2010   CYSTOSCOPY  10/16/2012   Procedure: CYSTOSCOPY;  Surgeon: Malka So, MD;  Location: [facility];  Service: Urology;  Laterality: N/A;  Cystoscopy with Clot Evacuation and fulguration bladder neck.   EXTRACORPOREAL SHOCK WAVE LITHOTRIPSY  10-15-2009   RIGHT   KNEE ARTHROSCOPY  1978   RIGHT   LEFT HEART CATH AND CORS/GRAFTS ANGIOGRAPHY N/A 12/17/2021   Procedure: LEFT HEART CATH AND CORS/GRAFTS ANGIOGRAPHY;  Surgeon: [name], MD;  Location: [facility];  Service: Cardiovascular;  Laterality: N/A;   LEFT SHOULDER SURGERY   2002   LUMBAR FUSION  09-10-2008   L4 - L5   PERCUTANEOUS NEPHROSTOLITHOTOMY  1979   PROSTATE BIOPSY  10/16/2012   Procedure: PROSTATE BIOPSY;  Surgeon: Malka So, MD;  Location: [facility];  Service: Urology;  Laterality: N/A;  Through ultrasound.    RIGHT/LEFT HEART CATH AND CORONARY/GRAFT ANGIOGRAPHY N/A 10/26/2018   Procedure: RIGHT/LEFT HEART CATH AND CORONARY/GRAFT ANGIOGRAPHY;  Surgeon: Martinique, Peter M, MD;  Location: [place] [facility];  Service: Cardiovascular;  Laterality: N/A;   SHOULDER ARTHROSCOPY W/ SUBACROMIAL DECOMPRESSION AND DISTAL CLAVICLE EXCISION  10-11-2004   AND PARTIAL ROTATOR CUFF REPAIR   TRANSTHORACIC ECHOCARDIOGRAM  27/11/5007   LV SYSTOLIC FUNCTION MILDLY REDUCED/ EF 48%/ LV FILLING PATTERN  IS IMPAIRED/ HYPOKINESIS IN THE MID AND BASALAR SEPTUM & ANTEROSEPTUM   TURP VAPORIZATION  01-19-2010   BPH W/ BOO   URETEROLITHOTOMY  1976    MEDICATIONS:  amLODipine (  NORVASC) 5 MG tablet   aspirin 81 MG EC tablet   clopidogrel (PLAVIX) 75 MG tablet   ezetimibe (ZETIA) 10 MG tablet   famotidine (PEPCID) 40 MG tablet   finasteride (PROSCAR) 5 MG tablet   insulin glargine (LANTUS SOLOSTAR) 100 UNIT/ML Solostar Pen   Insulin Pen Needle 31G X 6 MM MISC   Insulin Syringe-Needle U-100 (BD VEO INSULIN SYRINGE U/F) 31G X 15/64" 1 ML MISC   isosorbide mononitrate (IMDUR) 120 MG 24 hr tablet   metoprolol succinate (TOPROL-XL) 50 MG 24 hr tablet   nitroGLYCERIN (NITROSTAT) 0.4 MG SL tablet   pantoprazole (PROTONIX) 40 MG tablet   ranolazine (RANEXA) 500 MG 12 hr tablet   rosuvastatin (CRESTOR) 40 MG tablet   sacubitril-valsartan (ENTRESTO) 49-51 MG   Semaglutide, 2 MG/DOSE, 8 MG/3ML SOPN   No current facility-administered medications for this encounter.    Konrad Felix Ward, PA-C [facility]Surgical [facility] [phone]

## 2022-10-05 NOTE — Telephone Encounter (Signed)
Pt missed his tele pre op appt and needed to reschedule. Pt never called back to reschedule his tele appt. Pt will need to be added onto pre op sched today due to procedure is tomorrow. Pt states he started holding his ASA and Plavix x 5 days ago. Pt has been added today to 11:50.

## 2022-10-05 NOTE — H&P (Signed)
CC/HPI: 10/03/2022: This gentleman with an underlying prostate cancer diagnosis and is here today for preoperative appointment prior to undergoing fiducial marker placement and SpaceOAR insertion later this week in anticipation of beginning XRT. Patient was also started on ADT with Firmagon at last exam. He is due for repeat labs and repeat Firmagon injection today. He denies any changes in past medical history, prescription medications taken on a daily basis, no interval surgical or procedural intervention. He has had no recent dysuria, gross hematuria or changes in baseline lower urinary tract symptoms from prior baseline. Denies any recent fevers or chills, nausea/vomiting. He has had no recent chest pain, shortness of breath, lightheadedness or dizziness. He did not have any notable side effects with the initial Firmagon injection.     ALLERGIES: No Allergies    MEDICATIONS: Lisinopril  Metoprolol Succinate 50 mg tablet, extended release 24 hr 1 tablet PO Daily  Aspir 81  Atorvastatin Calcium  Carvedilol 12.5 mg tablet Oral  Clopidogrel 75 mg tablet 1 tablet PO Daily  Diazepam 10 mg tablet 1-2 tablet PO Daily Take one hour prior to procedure.  Entresto 49 mg-51 mg tablet 1 tablet PO Daily  Humalog Mix 75-25 Kwikpen 100 unit/ml (75-25) insulin pen Subcutaneous  Levofloxacin 750 mg tablet 1 po 1 hour prior to the procedure  Nystatin 100,000 unit/gram cream apply sparingly to rash 2-3x daily  Oxycodone-Acetaminophen 2.5 mg-300 mg tablet Oral  Ozempic  Zetia 10 mg tablet 1 tablet PO Daily     GU PSH: Cysto Remove Stent FB Com - 2011 Cysto Remove Stent FB Sim - 2010 Cystoscopy - 2018 Cystoscopy Fulguration - 2014 Cystoscopy Insert Stent - 2011 Cystoscopy Irrigate Clot - 2014 Cystoscopy TURP - 2011 Cystoscopy Ureteroscopy - 2010 ESWL - 2015, 2011 Prostate Needle Biopsy - 05/17/2022, 2014 Remove Kidney Stone - 2010       [place] Notes: Lithotripsy, Biopsy Of The Prostate Needle,  Transurethral Fulguration For Post-op Bleeding, Cystourethroscopy With Irrigation And Evacuation Of Clots, Transurethral Resection Of Prostate (TURP), Cystoscopy With Complicated Removal Of Object, Lithotripsy, Cystoscopy With Insertion Of Ureteral Stent Right, Cystoscopy With Removal Of Object, Cystoscopy With Ureteroscopy, Lithotomy, Coronary Artery Bypass Graft (CABG), Back Surgery 2009   NON-GU PSH: CABG (coronary artery bypass grafting) - 2010 Surgical Pathology, Gross And Microscopic Examination For Prostate Needle - 05/17/2022     GU PMH: BPH w/LUTS, I will stop the finasteride for now. - 08/16/2022, He is voiding well on finasteride. , - 06/13/2022, He is voiding well on finasteride and has had no further hematuria. , - 2018, Benign prostatic hyperplasia with urinary obstruction, - 2016 Nocturia - 08/16/2022, - 06/13/2022, - 03/07/2022, He is voiding well on the finasteride. , - 01/29/2021, - 2018 Prostate Cancer, I reviewed the options for ADT and with his cardiac history believe an LHRH antagonist would be most appropriate. There is a severe interaction warning between Orgovyx and Carvedilol so we have elected to proceed with [place] and I reviewed the side effects in detail including hot flashes, depression, cognative issues, loss of bone and muscle mass and metabolic side effects. He will return in a month with labs for his next injection. I will post for SpaceOAR and fiducials and reviewed the risks of that procedure including bleeding, infection, rectal injury or ulceration, embolic events and anesthetic complications. - 08/16/2022, - 07/05/2022, Legrand Como had a T2a N0 M0 high risk prostate cancer with a 213m prostate on finasteride. He has DM and CAD with CHF. He is not  a good candidate for surveillance and his prostate is too large for seeds, cryo or HIFU. I discussed the risks of RALP as well as the risks of EXRT and 73moof ADT in detail as well. I explained that for men over 744 particularly with  comorbities, I would generally recommend EXRT with ADT for high risk disease but with his large prostate, surgery might be worth considering. [facility]. If he elects EXRT with ADT, [place] effects specific to that option. I reviewed the use of SpaceOAR and fiducials and their associated risks and benefits. , - 06/13/2022, - 05/17/2022, - 03/07/2022, He is overdue for a PSA and suveillance biopsy. He has a right apical nodule. I will get a PSA today and get him set up for a prostate MRI and then will consider a biopsy based on those results. If the MRI is negative he will need a standard biopsy., - 01/29/2021, - 2018, Prostate cancer, - 2016 ED due to arterial insufficiency, He has limited erectile function and is not a candidate for PDE5's. I explained the negative impact on erections that are likely with either of the treatment options. - 06/13/2022, Erectile dysfunction due to arterial insufficiency, - 2016 Elevated PSA - 05/17/2022, He is overdue for a PSA and I will consider an MRIP based on those results. He will have a repeat PSA in a year. , - 2018 Prostate nodule w/ LUTS - 05/17/2022, - 03/07/2022, - 01/29/2021 Renal calculus (Stable), He has had no symptoms from his stones. - 01/29/2021, (Stable), - 2018, Bilateral kidney stones, - 2015 Gross hematuria - 2018, Gross Hematuria, - 2014 Other dorsalgia - 2018 Weak Urinary Stream, Weak urinary stream - 2016 Other microscopic hematuria, Microscopic hematuria - 2015 Ureteral calculus, Calculus of left ureter - 2015, Distal Ureteral Stone On The Right, - 2014 History of urolithiasis, Nephrolithiasis - 2014 Personal Hx Oth male genital organs diseases, History of epididymitis - 2014 RUQ pain, Abdominal Pain In The Right Upper Belly (RUQ) - 2014 Urge incontinence, Urge Incontinence Of Urine - 2014 Urinary Retention, Unspec, Acute Urinary  Retention - 2014 Urinary Tract Inf, Unspec site, Pyuria - 2014      PMH Notes:  2009-05-25 12:49:44 - Note: Coronary Artery Disease  2010-01-18 10:03:18 - Note: Bladder Calculus  2010-01-04 14:09:37 - Note: Nephrolithiasis Of The Right Kidney   NON-GU PMH: Encounter for general adult medical examination without abnormal findings, Encounter for preventive health examination - 2015 Foreign body in bladder, initial encounter, Foreign body in bladder - 2014 Personal history of other endocrine, nutritional and metabolic disease, History of diabetes mellitus - 2014 Personal history of other specified conditions, History of nausea and vomiting - 2014 Diabetes Type 2 Heart disease, unspecified Hypercholesterolemia Myocardial Infarction    FAMILY HISTORY: 1 son - Other 3 daughters - Other Cancer - Mother Death In The Family Father - Runs In Family Diabetes - Mother, Father Family Health Status Number - Runs In Family nephrolithiasis - Father renal failure - Mother   SOCIAL HISTORY: Marital Status: Single Preferred Language: English; Race: White Current Smoking Status: Patient has never smoked.   Tobacco Use Assessment Completed: Used Tobacco in last 30 days? Drinks 3 caffeinated drinks per day.     Notes: Occupation:, Previous History Of Smoking, Being A Social Drinker, Caffeine Use, Marital History - Currently Married  REVIEW OF SYSTEMS:    GU Review Male:   Patient reports get up at night to urinate. Patient denies frequent urination, hard to postpone urination, burning/ pain with urination, leakage of urine, stream starts and stops, trouble starting your stream, have to strain to urinate , erection problems, and penile pain.  Gastrointestinal (Upper):   Patient denies nausea, vomiting, and indigestion/ heartburn.  Gastrointestinal (Lower):   Patient denies diarrhea and constipation.  Constitutional:   Patient denies fever, night sweats, weight loss, and fatigue.  Skin:   Patient  denies skin rash/ lesion and itching.  Eyes:   Patient denies blurred vision and double vision.  Ears/ Nose/ Throat:   Patient denies sore throat and sinus problems.  Hematologic/Lymphatic:   Patient denies easy bruising and swollen glands.  Cardiovascular:   Patient denies leg swelling and chest pains.  Respiratory:   Patient denies cough and shortness of breath.  Endocrine:   Patient denies excessive thirst.  Musculoskeletal:   Patient denies back pain and joint pain.  Neurological:   Patient denies headaches and dizziness.  Psychologic:   Patient denies depression and anxiety.   Notes: Updated from previous visit 03/07/2022 with review from patient as noted above.     MULTI-SYSTEM PHYSICAL EXAMINATION:    Constitutional: Well-nourished. No physical deformities. Normally developed. Good grooming.  Neck: Neck symmetrical, not swollen. Normal tracheal position.  Respiratory: Normal breath sounds. No labored breathing, no use of accessory muscles.   Cardiovascular: Normal temperature, normal extremity pulses, no swelling, no varicosities.  Skin: No paleness, no jaundice, no cyanosis. No lesion, no ulcer, no rash.  Neurologic / Psychiatric: Oriented to time, oriented to place, oriented to person. No depression, no anxiety, no agitation.  Gastrointestinal: Obese abdomen. No mass, no tenderness, no rigidity.   Musculoskeletal: Normal gait and station of head and neck.     Complexity of Data:  Source Of History:  Patient, Medical Record Summary  Lab Test Review:   PSA  Records Review:   Pathology Reports, Previous Doctor Records, Previous Hospital Records, Previous Patient Records  Urine Test Review:   Urinalysis  X-Ray Review: PET- PSMA Scan: Reviewed Report.     03/07/22 01/29/21 07/31/17 06/09/15 08/12/14 07/04/14 12/20/13 10/12/12  PSA  Total PSA 9.65 ng/mL 4.42 ng/mL 4.10 ng/mL 5.53  47.70  3.89  4.14  10.39   Free PSA  0.91 ng/mL 1.05 ng/mL       % Free PSA  21 % PSA 26 % PSA         PROCEDURES:          Firmagon '80mg'$  - [id number], [id number] '80mg'$  given 0 wasted    Qty: 80 Adm. By: Melvyn Novas  Unit: mg Lot No [id number]  Route: SQ Exp. Date 08/12/2024  Freq: Q1M Mfgr.:   Site: left upper abd    ASSESSMENT:      ICD-10 Details  1 GU:   Prostate Cancer - C61 Chronic, Life Threatening  2 NON-GU:   Encounter for other preprocedural examination - Z01.818 Undiagnosed New Problem   PLAN:             Notes:   He will have testosterone and PSA labs drawn today. Will also have Firmagon administered at 80 mg monthly moving forward including today's injection. All questions answered to the best my ability regarding the upcoming procedure and expected postoperative course with understanding expressed by the patient. Urine culture sent today to serve as a baseline. He will proceed with previously  scheduled for fiducial marker placement and SpaceOAR on 1/25. I did tentatively request follow-up again with Dr. Jeffie Pollock in 3 months time for repeat labs and exam.

## 2022-10-05 NOTE — Telephone Encounter (Signed)
[facility] on the phone. they are asking to speak to someone in pre opp. the patient missed his phone interview. they have some questions. Please advise

## 2022-10-05 NOTE — Progress Notes (Addendum)
Virtual Visit via Telephone Note   Because of Russell Trevino's co-morbid illnesses, he is at least at moderate risk for complications without adequate follow up.  This format is felt to be most appropriate for this patient at this time.  The patient did not have access to video technology/had technical difficulties with video requiring transitioning to audio format only (telephone).  All issues noted in this document were discussed and addressed.  No physical exam could be performed with this format.  Please refer to the patient's chart for his consent to telehealth for [facility].  Evaluation Performed:  Preoperative cardiovascular risk assessment _____________   Date:  10/05/2022   Patient ID:  Russell Trevino, DOB 1950-09-24, MRN [id number] Patient Location:  Home Provider location:   Office  Primary Care Provider:  Binnie Rail, MD Primary Cardiologist:  Buford Dresser, MD  Chief Complaint / Patient Profile   72 y.o. y/o male with a h/o CAD status post prior CABG, NSTEMI 10/2018, ischemic cardiomyopathy with EF 20-25%, hypertension, hyperlipidemia, and type 2 diabetes who is pending gold seed markers and spacer placement in prostate on 10/06/2021 with Dr. Lear Ng of [facility] and presents today for telephonic preoperative cardiovascular risk assessment.  History of Present Illness    Russell Trevino is a 72 y.o. male who presents via audio/video conferencing for a telehealth visit today.   Echo in 12/2021 showed EF 20 to 25%, anatomy. Pt was last seen in cardiology clinic on 05/19/2022 by Dr. Harrell Gave. At that time IDRISS QUACKENBUSH was doing well. Follow-up was recommended in 6 months with plans for reevaluation of LVEF to determine need for possible ICD. The patient is now pending procedure as outlined above. Since his last visit, he has done well from a cardiac standpoint.   He denies chest pain, palpitations, dyspnea, pnd, orthopnea, n, v, dizziness,  syncope, edema, weight gain, or early satiety. All other systems reviewed and are otherwise negative except as noted above.   Past Medical History    Past Medical History:  Diagnosis Date   BPH (benign prostatic hypertrophy)    Cancer (HCC)    prostate   Cardiomyopathy, ischemic    CHF (congestive heart failure) ([place])    Coronary artery disease    at [facility], [place] 2009 (GRAFTS PATENT), cath 05/2018 w/ SVG-PDA 100%>>med rx   Diabetes mellitus, type 2 (HCC)    Frequency of urination    GERD (gastroesophageal reflux disease)    Gross hematuria    History of CHF (congestive heart failure) 2007   History of kidney stones    History of myocardial infarction 2006, 2019   2006>>CABG, 2019 cath w/ med rx   History of urinary retention    Hyperlipidemia    Hypertension    Myocardial infarction ([place])    2006   Nocturia    S/P CABG x 4 2006   Past Surgical History:  Procedure Laterality Date   CARDIAC CATHETERIZATION  01/17/2008   GRAFTS PATENT / EF 40%,  DR SANTOS ([facility])   CORONARY ARTERY BYPASS GRAFT  04/2005   LIMA-LAD, L radial-OM-D1 and SVG-PDA   CYSTO / RIGHT URETERAL STENT PLACEMENT  10-08-1999   CYSTO/  REMOVAL BLADDER STONES  01-14-2010   CYSTOSCOPY  10/16/2012   Procedure: CYSTOSCOPY;  Surgeon: Malka So, MD;  Location: [facility];  Service: Urology;  Laterality: N/A;  Cystoscopy with Clot Evacuation and fulguration bladder neck.   EXTRACORPOREAL SHOCK WAVE LITHOTRIPSY  10-15-2009   RIGHT   KNEE ARTHROSCOPY  1978   RIGHT   LEFT HEART CATH AND CORS/GRAFTS ANGIOGRAPHY N/A 12/17/2021   Procedure: LEFT HEART CATH AND CORS/GRAFTS ANGIOGRAPHY;  Surgeon: [name], MD;  Location: [place] [facility];  Service: Cardiovascular;  Laterality: N/A;   LEFT SHOULDER SURGERY   2002   LUMBAR FUSION  09-10-2008   L4 - L5   PERCUTANEOUS NEPHROSTOLITHOTOMY  1979   PROSTATE BIOPSY  10/16/2012   Procedure: PROSTATE BIOPSY;  Surgeon: Malka So, MD;  Location: [facility];  Service: Urology;  Laterality: N/A;  Through ultrasound.   RIGHT/LEFT HEART CATH AND CORONARY/GRAFT ANGIOGRAPHY N/A 10/26/2018   Procedure: RIGHT/LEFT HEART CATH AND CORONARY/GRAFT ANGIOGRAPHY;  Surgeon: Martinique, Peter M, MD;  Location: [place] [facility];  Service: Cardiovascular;  Laterality: N/A;   SHOULDER ARTHROSCOPY W/ SUBACROMIAL DECOMPRESSION AND DISTAL CLAVICLE EXCISION  10-11-2004   AND PARTIAL ROTATOR CUFF REPAIR   TRANSTHORACIC ECHOCARDIOGRAM  09/73/5329   LV SYSTOLIC FUNCTION MILDLY REDUCED/ EF 48%/ LV FILLING PATTERN  IS IMPAIRED/ HYPOKINESIS IN THE MID AND BASALAR SEPTUM & ANTEROSEPTUM   TURP VAPORIZATION  01-19-2010   BPH W/ BOO   URETEROLITHOTOMY  1976    Allergies  No Known Allergies  Home Medications    Prior to Admission medications   Medication Sig Start Date End Date Taking? Authorizing Provider  amLODipine (NORVASC) 5 MG tablet Take 1 tablet by mouth daily. 06/23/22 07/28/23  [provider]  aspirin 81 MG EC tablet Take 1 tablet (81 mg total) by mouth in the morning. 12/28/21   Loel Dubonnet, NP  clopidogrel (PLAVIX) 75 MG tablet Take 1 tablet (75 mg total) by mouth daily. 04/15/22   Loel Dubonnet, NP  ezetimibe (ZETIA) 10 MG tablet Take 10 mg by mouth daily.    [provider]  famotidine (PEPCID) 40 MG tablet Take 40 mg by mouth at bedtime. 09/14/22   [provider]  finasteride (PROSCAR) 5 MG tablet TAKE 1 TABLET(5 MG) BY MOUTH DAILY Patient taking differently: Take 5 mg by mouth at bedtime. 10/15/20   Binnie Rail, MD  insulin glargine (LANTUS SOLOSTAR) 100 UNIT/ML Solostar Pen Inject 30 Units into the skin 2 (two) times daily. 12/24/21   Binnie Rail, MD  Insulin Pen Needle 31G X 6 MM MISC UAD for insulin pen 12/24/21   Binnie Rail, MD  Insulin Syringe-Needle U-100 (BD VEO INSULIN SYRINGE U/F) 31G X 15/64" 1 ML MISC use as directed TO INJECT INSULIN  twice a day 07/12/21   Binnie Rail, MD  isosorbide  mononitrate (IMDUR) 120 MG 24 hr tablet Take 1 tablet (120 mg total) by mouth 2 (two) times daily. 12/28/21   Loel Dubonnet, NP  metoprolol succinate (TOPROL-XL) 50 MG 24 hr tablet Take 1 tablet (50 mg total) by mouth daily at 12 noon. Take with or immediately following a meal. 12/28/21   Walker, Martie Lee, NP  nitroGLYCERIN (NITROSTAT) 0.4 MG SL tablet PLACE 1 TABLET UNDER THE TONGUE EVERY 5 MINS FOR CHEST FOR PAIN 05/18/22   Binnie Rail, MD  pantoprazole (PROTONIX) 40 MG tablet Take 40 mg by mouth daily.    [provider]  ranolazine (RANEXA) 500 MG 12 hr tablet Take 500 mg by mouth 2 (two) times daily.    [provider]  rosuvastatin (CRESTOR) 40 MG tablet Take 1 tablet (40 mg total) by mouth daily. 12/28/21   Gilford Rile,  Martie Lee, NP  sacubitril-valsartan (ENTRESTO) 49-51 MG Take 1 tablet by mouth 2 (two) times daily. 12/28/21   Loel Dubonnet, NP  Semaglutide, 2 MG/DOSE, 8 MG/3ML SOPN Inject 2 mg into the skin once a week. 09/27/22   [provider]    Physical Exam    Vital Signs:  [name] does not have vital signs available for review today.  Given telephonic nature of communication, physical exam is limited. AAOx3. NAD. Normal affect.  Speech and respirations are unlabored.  Accessory Clinical Findings    None  Assessment & Plan    1.  Preoperative Cardiovascular Risk Assessment:  According to the Revised Cardiac Risk Index (RCRI), his Perioperative Risk of Major Cardiac Event is (%): 6.6. His Functional Capacity in METs is: 7.99 according to the Duke Activity Status Index (DASI). Pt has a history of reduced EF, he was stable at last OV, he is stable on upon assessment today with no new symptoms or concerns.Therefore, based on ACC/AHA guidelines, patient would be at acceptable risk for the planned procedure without further cardiovascular testing.    The patient was advised that if he develops new symptoms prior to surgery to contact our office  to arrange for a follow-up visit, and he verbalized understanding. Otherwise, he may follow-up as planned in 11/2022.  Per office protocol, he may hold Plavix x 5 days and aspirin x 7 days prior to procedure. Please resume when medically safe to do so.    A copy of this note will be routed to requesting surgeon.  Time:   Today, I have spent 5 minutes with the patient with telehealth technology discussing medical history, symptoms, and management plan.     Lenna Sciara, NP  10/05/2022, 1:27 PM

## 2022-10-05 NOTE — Anesthesia Preprocedure Evaluation (Signed)
Anesthesia Evaluation  Patient identified by MRN, date of birth, ID band Patient awake    Reviewed: Allergy & Precautions, H&P , NPO status , Patient's Chart, lab work & pertinent test results, reviewed documented beta blocker date and time   Airway Mallampati: II  TM Distance: >3 FB Neck ROM: Full    Dental no notable dental hx. (+) Teeth Intact, Dental Advisory Given   Pulmonary neg pulmonary ROS   Pulmonary exam normal breath sounds clear to auscultation       Cardiovascular hypertension, Pt. on medications and Pt. on home beta blockers + angina  + CAD, + Past MI, + Cardiac Stents, + CABG and +CHF   Rhythm:Regular Rate:Normal     Neuro/Psych negative neurological ROS  negative psych ROS   GI/Hepatic Neg liver ROS,GERD  Medicated,,  Endo/Other  diabetes, Insulin Dependent    Renal/GU Renal disease  negative genitourinary   Musculoskeletal   Abdominal   Peds  Hematology negative hematology ROS (+)   Anesthesia Other Findings   Reproductive/Obstetrics negative OB ROS                             Anesthesia Physical Anesthesia Plan  ASA: 4  Anesthesia Plan: General   Post-op Pain Management: Minimal or no pain anticipated   Induction: Intravenous  PONV Risk Score and Plan: 2 and Ondansetron and Dexamethasone  Airway Management Planned: Oral ETT  Additional Equipment:   Intra-op Plan:   Post-operative Plan: Extubation in OR  Informed Consent: I have reviewed the patients History and Physical, chart, labs and discussed the procedure including the risks, benefits and alternatives for the proposed anesthesia with the patient or authorized representative who has indicated his/her understanding and acceptance.     Dental advisory given  Plan Discussed with: CRNA and Surgeon  Anesthesia Plan Comments: (See PAT note 10/04/22)       Anesthesia Quick Evaluation

## 2022-10-06 ENCOUNTER — Ambulatory Visit (HOSPITAL_BASED_OUTPATIENT_CLINIC_OR_DEPARTMENT_OTHER): Payer: Medicare Other | Admitting: Certified Registered Nurse Anesthetist

## 2022-10-06 ENCOUNTER — Ambulatory Visit (HOSPITAL_COMMUNITY): Payer: Medicare Other | Admitting: Certified Registered Nurse Anesthetist

## 2022-10-06 ENCOUNTER — Encounter (HOSPITAL_COMMUNITY): Payer: Self-pay | Admitting: Urology

## 2022-10-06 ENCOUNTER — Ambulatory Visit (HOSPITAL_COMMUNITY)
Admission: RE | Admit: 2022-10-06 | Discharge: 2022-10-06 | Disposition: A | Discharge status: Home / Self Care | Payer: Medicare Other | Attending: Urology | Admitting: Urology

## 2022-10-06 ENCOUNTER — Encounter (HOSPITAL_COMMUNITY)
Admission: RE | Disposition: A | Discharge status: Home / Self Care | Payer: Self-pay | Source: Home / Self Care | Attending: Urology

## 2022-10-06 ENCOUNTER — Other Ambulatory Visit: Payer: Self-pay

## 2022-10-06 ENCOUNTER — Ambulatory Visit: Payer: MEDICARE | Primary: Emergency Medicine

## 2022-10-06 DIAGNOSIS — Z955 Presence of coronary angioplasty implant and graft: Secondary | ICD-10-CM | POA: Insufficient documentation

## 2022-10-06 DIAGNOSIS — K219 Gastro-esophageal reflux disease without esophagitis: Secondary | ICD-10-CM | POA: Diagnosis not present

## 2022-10-06 DIAGNOSIS — Z951 Presence of aortocoronary bypass graft: Secondary | ICD-10-CM | POA: Insufficient documentation

## 2022-10-06 DIAGNOSIS — C61 Malignant neoplasm of prostate: Secondary | ICD-10-CM | POA: Insufficient documentation

## 2022-10-06 DIAGNOSIS — I25119 Atherosclerotic heart disease of native coronary artery with unspecified angina pectoris: Secondary | ICD-10-CM | POA: Insufficient documentation

## 2022-10-06 DIAGNOSIS — I252 Old myocardial infarction: Secondary | ICD-10-CM | POA: Diagnosis not present

## 2022-10-06 DIAGNOSIS — Z794 Long term (current) use of insulin: Secondary | ICD-10-CM | POA: Diagnosis not present

## 2022-10-06 DIAGNOSIS — Z87442 Personal history of urinary calculi: Secondary | ICD-10-CM | POA: Diagnosis not present

## 2022-10-06 DIAGNOSIS — I110 Hypertensive heart disease with heart failure: Secondary | ICD-10-CM | POA: Insufficient documentation

## 2022-10-06 DIAGNOSIS — E119 Type 2 diabetes mellitus without complications: Secondary | ICD-10-CM | POA: Diagnosis not present

## 2022-10-06 DIAGNOSIS — I509 Heart failure, unspecified: Secondary | ICD-10-CM | POA: Insufficient documentation

## 2022-10-06 DIAGNOSIS — I5042 Chronic combined systolic (congestive) and diastolic (congestive) heart failure: Secondary | ICD-10-CM

## 2022-10-06 HISTORY — PX: SPACE OAR INSTILLATION: SHX6769

## 2022-10-06 HISTORY — PX: GOLD SEED IMPLANT: SHX6343

## 2022-10-06 LAB — GLUCOSE, CAPILLARY
Glucose-Capillary: 71 mg/dL (ref 70–99)
Glucose-Capillary: 94 mg/dL (ref 70–99)

## 2022-10-06 SURGERY — INSERTION, GOLD SEEDS
Anesthesia: General | Site: Prostate

## 2022-10-06 MED ORDER — SUCCINYLCHOLINE CHLORIDE 200 MG/10ML IV SOSY
PREFILLED_SYRINGE | INTRAVENOUS | Status: AC
  Filled 2022-10-06: qty 10

## 2022-10-06 MED ORDER — SODIUM CHLORIDE (PF) 0.9 % IJ SOLN
INTRAMUSCULAR | Status: DC | PRN
  Administered 2022-10-06: 10 mL

## 2022-10-06 MED ORDER — CHLORHEXIDINE GLUCONATE 0.12 % MT SOLN
15.0000 mL | Freq: Once | OROMUCOSAL | Status: AC
  Administered 2022-10-06: 15 mL via OROMUCOSAL

## 2022-10-06 MED ORDER — PROPOFOL 10 MG/ML IV BOLUS
INTRAVENOUS | Status: AC
  Filled 2022-10-06: qty 20

## 2022-10-06 MED ORDER — ONDANSETRON HCL 4 MG/2ML IJ SOLN
INTRAMUSCULAR | Status: DC | PRN
  Administered 2022-10-06: 4 mg via INTRAVENOUS

## 2022-10-06 MED ORDER — ORAL CARE MOUTH RINSE: 15.0000 mL | Freq: Once | OROMUCOSAL | Status: AC

## 2022-10-06 MED ORDER — ETOMIDATE 2 MG/ML IV SOLN
INTRAVENOUS | Status: DC | PRN
  Administered 2022-10-06: 12 mg via INTRAVENOUS

## 2022-10-06 MED ORDER — FENTANYL CITRATE (PF) 250 MCG/5ML IJ SOLN
INTRAMUSCULAR | Status: DC | PRN
  Administered 2022-10-06: 100 ug via INTRAVENOUS

## 2022-10-06 MED ORDER — FENTANYL CITRATE PF 50 MCG/ML IJ SOSY: 25.0000 ug | PREFILLED_SYRINGE | INTRAMUSCULAR | Status: DC | PRN

## 2022-10-06 MED ORDER — ONDANSETRON HCL 4 MG/2ML IJ SOLN
INTRAMUSCULAR | Status: AC
  Filled 2022-10-06: qty 2

## 2022-10-06 MED ORDER — CEFAZOLIN SODIUM-DEXTROSE 2-4 GM/100ML-% IV SOLN
2.0000 g | INTRAVENOUS | Status: AC
  Administered 2022-10-06: 2 g via INTRAVENOUS

## 2022-10-06 MED ORDER — FENTANYL CITRATE (PF) 100 MCG/2ML IJ SOLN
INTRAMUSCULAR | Status: AC
  Filled 2022-10-06: qty 2

## 2022-10-06 MED ORDER — SUCCINYLCHOLINE CHLORIDE 200 MG/10ML IV SOSY
PREFILLED_SYRINGE | INTRAVENOUS | Status: DC | PRN
  Administered 2022-10-06: 120 mg via INTRAVENOUS

## 2022-10-06 MED ORDER — DEXAMETHASONE SODIUM PHOSPHATE 10 MG/ML IJ SOLN
INTRAMUSCULAR | Status: AC
  Filled 2022-10-06: qty 1

## 2022-10-06 MED ORDER — ETOMIDATE 2 MG/ML IV SOLN
INTRAVENOUS | Status: AC
  Filled 2022-10-06: qty 10

## 2022-10-06 MED ORDER — SODIUM CHLORIDE (PF) 0.9 % IJ SOLN
INTRAMUSCULAR | Status: AC
  Filled 2022-10-06: qty 10

## 2022-10-06 MED ORDER — LACTATED RINGERS IV SOLN: INTRAVENOUS | Status: DC

## 2022-10-06 MED ORDER — CEFAZOLIN SODIUM-DEXTROSE 2-4 GM/100ML-% IV SOLN
INTRAVENOUS | Status: AC
  Filled 2022-10-06: qty 100

## 2022-10-06 MED ORDER — DEXAMETHASONE SODIUM PHOSPHATE 10 MG/ML IJ SOLN
INTRAMUSCULAR | Status: DC | PRN
  Administered 2022-10-06: 5 mg via INTRAVENOUS

## 2022-10-06 MED ORDER — EPHEDRINE 5 MG/ML INJ
INTRAVENOUS | Status: AC
  Filled 2022-10-06: qty 5

## 2022-10-06 MED ORDER — FLEET ENEMA 7-19 GM/118ML RE ENEM: 1.0000 | ENEMA | Freq: Once | RECTAL | Status: DC

## 2022-10-06 SURGICAL SUPPLY — 27 items
BLADE CLIPPER SENSICLIP SURGIC (BLADE) ×1 IMPLANT
CNTNR URN SCR LID CUP LEK RST (MISCELLANEOUS) ×2 IMPLANT
CONT SPEC 4OZ STRL OR WHT (MISCELLANEOUS) ×2
COVER BACK TABLE 60X90IN (DRAPES) ×1 IMPLANT
DRAPE SURG 17X11 SM STRL (DRAPES) ×1 IMPLANT
DRSG TEGADERM 4X4.75 (GAUZE/BANDAGES/DRESSINGS) ×2 IMPLANT
DRSG TEGADERM 8X12 (GAUZE/BANDAGES/DRESSINGS) ×2 IMPLANT
GLOVE BIO SURGEON STRL SZ7.5 (GLOVE) ×1 IMPLANT
GOWN STRL REUS W/ TWL XL LVL3 (GOWN DISPOSABLE) ×1 IMPLANT
GOWN STRL REUS W/TWL XL LVL3 (GOWN DISPOSABLE) ×1
IMPL SPACEOAR VUE SYSTEM (Spacer) ×1 IMPLANT
IMPLANT SPACEOAR VUE SYSTEM (Spacer) ×1 IMPLANT
MARKER GOLD PRELOAD 1.2X3 (Urological Implant) ×1 IMPLANT
MARKER SKIN DUAL TIP RULER LAB (MISCELLANEOUS) ×1 IMPLANT
NDL SPNL 22GX3.5 QUINCKE BK (NEEDLE) ×1 IMPLANT
NDL SPNL 22GX7 QUINCKE BK (NEEDLE) IMPLANT
NEEDLE SPNL 22GX3.5 QUINCKE BK (NEEDLE) ×1 IMPLANT
NEEDLE SPNL 22GX7 QUINCKE BK (NEEDLE) IMPLANT
SEED GOLD PRELOAD 1.2X3 (Urological Implant) ×1 IMPLANT
SHEATH ULTRASOUND LF (SHEATH) ×1 IMPLANT
SHEATH ULTRASOUND LTX NONSTRL (SHEATH) ×1 IMPLANT
SURGILUBE 2OZ TUBE FLIPTOP (MISCELLANEOUS) ×1 IMPLANT
SYR 10ML LL (SYRINGE) ×1 IMPLANT
SYR CONTROL 10ML LL (SYRINGE) ×1 IMPLANT
TOWEL OR 17X26 10 PK STRL BLUE (TOWEL DISPOSABLE) ×1 IMPLANT
UNDERPAD 30X36 HEAVY ABSORB (UNDERPADS AND DIAPERS) ×1 IMPLANT
WATER STERILE IRR 500ML POUR (IV SOLUTION) ×1 IMPLANT

## 2022-10-06 NOTE — Transfer of Care (Signed)
Immediate Anesthesia Transfer of Care Note  Patient: Russell Trevino  Procedure(s) Performed: GOLD SEED IMPLANT (Prostate) SPACE OAR INSTILLATION (Prostate)  Patient Location: PACU  Anesthesia Type:General  Level of Consciousness: awake, oriented, and patient cooperative  Airway & Oxygen Therapy: Patient Spontanous Breathing and Patient connected to face mask oxygen  Post-op Assessment: Report given to RN and Post -op Vital signs reviewed and stable  Post vital signs: Reviewed  Last Vitals:  Vitals Value Taken Time  BP 151/79 10/06/22 1234  Temp    Pulse 62 10/06/22 1237  Resp 19 10/06/22 1237  SpO2 100 % 10/06/22 1237  Vitals shown include unvalidated device data.  Last Pain:  Vitals:   10/06/22 1004  TempSrc:   PainSc: 0-No pain         Complications: No notable events documented.

## 2022-10-06 NOTE — Discharge Instructions (Signed)
You should avoid strenuous activities today but may resume all normal activities tomorrow.  2.   You can take Tylenol as needed for any pain or discomfort.  3.    Follow up with your radiation oncologist for your simulation appointment as scheduled.  If this is not currently scheduled or you do not know the date/time for that appointment, please contact the radiation oncology office to confirm.   4.    You may resume aspirin and Plavix in 48 hrs

## 2022-10-06 NOTE — Op Note (Signed)
Preoperative diagnosis: Prostate cancer   Postoperative diagnosis: Prostate cancer   Procedure:  1) Fiducial marker placement into the prostate 2) Insertion of SpaceOAR hydrogel    Surgeon: Pryor Curia. M.D.   Anesthesia: IV sedation, Local anesthesia   EBL: Minimal   Complications: None   Indication: The potential risks, complications, alternative options, and expected recovery course associated with the above procedure(s) have been discussed in detail with the patient and he has provided informed consent to proceed.   Description of procedure: The patient was administered preoperative antibiotics, placed in the dorsal lithotomy position, and prepped and draped in the usual sterile fashion. A preoperative time out was performed.  Next, transrectal ultrasonography was utilized to visualize the prostate. 10 cc of 0.25% bupivacaine was then used to infiltrate the subcuateous tissue of the perineum and an additional 10 cc was injected into the lateral apical tissue surrounding the prostate for a periprostatic nerve block.  Three gold fiducial markers were then placed into the prostate via transperineal needles under ultrasound guidance at the right apex, right base, and left mid gland under direct ultrasound guidance.  A site in the midline was then selected on the perineum for placement of an 18 g needle with saline.  The needle was advanced above the rectum and below Denonvillier's fascia to the mid gland and confirmed to be in the midline on transverse imaging.  One cc of saline was injected confirming appropriate expansion of this space.  A total of 5-10 cc of saline was then injected to open the space further bilaterally.  The saline syringe was then removed and the SpaceOAR hydrogel was injected with good distribution bilaterally. He tolerated the procedure well and without complications. He was able to be awakened and transferred to the PACU in stable condition.

## 2022-10-06 NOTE — Interval H&P Note (Signed)
History and Physical Interval Note:  10/06/2022 9:38 AM  Russell Trevino  has presented today for surgery, with the diagnosis of PROSTATE CANCER.  The various methods of treatment have been discussed with the patient and family. After consideration of risks, benefits and other options for treatment, the patient has consented to  Procedure(s) with comments: GOLD SEED IMPLANT (N/A) SPACE OAR INSTILLATION (N/A) - 30 MINS FOR CASE as a surgical intervention.  The patient's history has been reviewed, patient examined, no change in status, stable for surgery.  I have reviewed the patient's chart and labs.  Questions were answered to the patient's satisfaction.     [employer]

## 2022-10-06 NOTE — Anesthesia Procedure Notes (Addendum)
Procedure Name: Intubation Date/Time: 10/06/2022 12:07 PM  Performed by: Jenne Campus, CRNAPre-anesthesia Checklist: Patient identified, Emergency Drugs available, Suction available and Patient being monitored Patient Re-evaluated:Patient Re-evaluated prior to induction Oxygen Delivery Method: Circle System Utilized Preoxygenation: Pre-oxygenation with 100% oxygen Induction Type: IV induction Ventilation: Mask ventilation without difficulty Laryngoscope Size: Mac and 3 Grade View: Grade II Tube type: Oral Tube size: 7.5 mm Number of attempts: 1 Airway Equipment and Method: Stylet and Oral airway Placement Confirmation: ETT inserted through vocal cords under direct vision, positive ETCO2 and breath sounds checked- equal and bilateral Secured at: 22 cm Tube secured with: Tape Dental Injury: Teeth and Oropharynx as per pre-operative assessment

## 2022-10-06 NOTE — Anesthesia Postprocedure Evaluation (Signed)
Anesthesia Post Note  Patient: Russell Trevino  Procedure(s) Performed: GOLD SEED IMPLANT (Prostate) SPACE OAR INSTILLATION (Prostate)     Patient location during evaluation: PACU Anesthesia Type: General Level of consciousness: awake and alert Pain management: pain level controlled Vital Signs Assessment: post-procedure vital signs reviewed and stable Respiratory status: spontaneous breathing, nonlabored ventilation and respiratory function stable Cardiovascular status: blood pressure returned to baseline and stable Postop Assessment: no apparent nausea or vomiting Anesthetic complications: no  No notable events documented.  Last Vitals:  Vitals:   10/06/22 1300 10/06/22 1315  BP: (!) 155/77 (!) 148/70  Pulse: (!) 58 61  Resp: (!) 8 13  Temp: 36.9 C 36.9 C  SpO2: 99% 98%    Last Pain:  Vitals:   10/06/22 1315  TempSrc:   PainSc: 0-No pain                 Yael Angerer,W. EDMOND

## 2022-10-06 NOTE — Progress Notes (Signed)
RN left voicemail to follow up with patient to assess any navigation needs prior to starting his radiation treatment.   Plan of care in progress, will continue to follow.

## 2022-10-06 NOTE — Progress Notes (Signed)
1010 He didn't do a fleet's enema last evening.

## 2022-10-07 ENCOUNTER — Encounter (HOSPITAL_COMMUNITY): Payer: Self-pay | Admitting: Urology

## 2022-10-11 ENCOUNTER — Telehealth: Payer: Self-pay | Admitting: *Deleted

## 2022-10-11 DIAGNOSIS — Z191 Hormone sensitive malignancy status: Secondary | ICD-10-CM | POA: Diagnosis not present

## 2022-10-11 NOTE — Telephone Encounter (Signed)
Called patient to remind of sim appt. for 10-13-22- arrival time- 8:45 am @ [facility], informed patient to arrive with a full bladder, spoke with patient and he is aware of this appt. and the instructions

## 2022-10-12 NOTE — Progress Notes (Signed)
  Radiation Oncology         [phone] ________________________________  Name: Russell Trevino MRN: [id number]  Date: 10/13/2022  DOB: Jan 25, 1951  SIMULATION AND TREATMENT PLANNING NOTE    ICD-10-CM   1. Prostate cancer [facility])  C61       DIAGNOSIS:  72 y.o. gentleman with stage T2a adenocarcinoma of the prostate with a Gleason's score of 4+5 and a PSA of 9.65 on finasteride   NARRATIVE:  The patient was brought to the [facility].  Identity was confirmed.  All relevant records and images related to the planned course of therapy were reviewed.  The patient freely provided informed written consent to proceed with treatment after reviewing the details related to the planned course of therapy. The consent form was witnessed and verified by the simulation staff.  Then, the patient was set-up in a stable reproducible supine position for radiation therapy.  A vacuum lock pillow device was custom fabricated to position his legs in a reproducible immobilized position.  Then, I performed a urethrogram under sterile conditions to identify the prostatic apex.  CT images were obtained.  Surface markings were placed.  The CT images were loaded into the planning software.  Then the prostate target and avoidance structures including the rectum, bladder, bowel and hips were contoured.  Treatment planning then occurred.  The radiation prescription was entered and confirmed.  A total of one complex treatment devices was fabricated. I have requested : Intensity Modulated Radiotherapy (IMRT) is medically necessary for this case for the following reason:  Rectal sparing.Marland Kitchen  PLAN:   The prostate, seminal vesicles, and pelvic lymph nodes will initially be treated to 45 Gy in 25 fractions of 1.8 Gy followed by a boost to the prostate only, to 75 Gy with 15 additional fractions of 2.0 Gy   ________________________________  Sheral Apley Tammi Klippel, M.D.

## 2022-10-13 ENCOUNTER — Ambulatory Visit
Admission: RE | Admit: 2022-10-13 | Discharge: 2022-10-13 | Disposition: A | Discharge status: Home / Self Care | Payer: Medicare Other | Source: Ambulatory Visit | Attending: Radiation Oncology | Admitting: Radiation Oncology

## 2022-10-13 ENCOUNTER — Encounter: Payer: Self-pay | Admitting: Urology

## 2022-10-13 ENCOUNTER — Encounter: Payer: MEDICARE | Attending: Internal Medicine | Primary: Emergency Medicine

## 2022-10-13 DIAGNOSIS — Z191 Hormone sensitive malignancy status: Secondary | ICD-10-CM | POA: Diagnosis not present

## 2022-10-13 DIAGNOSIS — C61 Malignant neoplasm of prostate: Secondary | ICD-10-CM | POA: Diagnosis present

## 2022-10-13 DIAGNOSIS — Z510 Encounter for antineoplastic radiation therapy: Secondary | ICD-10-CM | POA: Diagnosis not present

## 2022-10-13 NOTE — Progress Notes (Signed)
Patient is getting monthly Firmagon injections at [facility], started 08/16/22 and tolerating well.  Nicholos Johns, MMS, PA-C [facility]: [phone]  Fax: [phone]

## 2022-10-14 ENCOUNTER — Telehealth: Payer: Self-pay | Admitting: *Deleted

## 2022-10-14 ENCOUNTER — Encounter (HOSPITAL_BASED_OUTPATIENT_CLINIC_OR_DEPARTMENT_OTHER): Payer: Self-pay

## 2022-10-14 NOTE — Telephone Encounter (Signed)
Secure chat sent to Russell Trevino to contact the patient to bring back itamar device that was given to him in September.

## 2022-10-19 DIAGNOSIS — Z191 Hormone sensitive malignancy status: Secondary | ICD-10-CM | POA: Diagnosis not present

## 2022-10-19 DIAGNOSIS — Z510 Encounter for antineoplastic radiation therapy: Secondary | ICD-10-CM | POA: Diagnosis not present

## 2022-10-24 ENCOUNTER — Encounter: Attending: Emergency Medicine | Primary: Emergency Medicine

## 2022-10-25 ENCOUNTER — Ambulatory Visit
Admission: RE | Admit: 2022-10-25 | Discharge: 2022-10-25 | Disposition: A | Discharge status: Home / Self Care | Payer: Medicare Other | Source: Ambulatory Visit | Attending: Radiation Oncology | Admitting: Radiation Oncology

## 2022-10-25 ENCOUNTER — Other Ambulatory Visit: Payer: Self-pay

## 2022-10-25 DIAGNOSIS — Z191 Hormone sensitive malignancy status: Secondary | ICD-10-CM | POA: Diagnosis not present

## 2022-10-25 DIAGNOSIS — Z510 Encounter for antineoplastic radiation therapy: Secondary | ICD-10-CM | POA: Diagnosis not present

## 2022-10-25 DIAGNOSIS — C61 Malignant neoplasm of prostate: Secondary | ICD-10-CM

## 2022-10-25 LAB — RAD ONC ARIA SESSION SUMMARY
Course Elapsed Days: 0
Plan Fractions Treated to Date: 1
Plan Prescribed Dose Per Fraction: 1.8 Gy
Plan Total Fractions Prescribed: 25
Plan Total Prescribed Dose: 45 Gy
Reference Point Dosage Given to Date: 1.8 Gy
Reference Point Session Dosage Given: 1.8 Gy
Session Number: 1

## 2022-10-26 ENCOUNTER — Other Ambulatory Visit: Payer: Self-pay

## 2022-10-26 ENCOUNTER — Ambulatory Visit
Admission: RE | Admit: 2022-10-26 | Discharge: 2022-10-26 | Disposition: A | Discharge status: Home / Self Care | Payer: Medicare Other | Source: Ambulatory Visit | Attending: Radiation Oncology | Admitting: Radiation Oncology

## 2022-10-26 DIAGNOSIS — Z191 Hormone sensitive malignancy status: Secondary | ICD-10-CM | POA: Diagnosis not present

## 2022-10-26 DIAGNOSIS — Z510 Encounter for antineoplastic radiation therapy: Secondary | ICD-10-CM | POA: Diagnosis not present

## 2022-10-26 LAB — RAD ONC ARIA SESSION SUMMARY
Course Elapsed Days: 1
Plan Fractions Treated to Date: 2
Plan Prescribed Dose Per Fraction: 1.8 Gy
Plan Total Fractions Prescribed: 25
Plan Total Prescribed Dose: 45 Gy
Reference Point Dosage Given to Date: 3.6 Gy
Reference Point Session Dosage Given: 1.8 Gy
Session Number: 2

## 2022-10-27 ENCOUNTER — Ambulatory Visit
Admission: RE | Admit: 2022-10-27 | Discharge: 2022-10-27 | Disposition: A | Discharge status: Home / Self Care | Payer: Medicare Other | Source: Ambulatory Visit | Attending: Radiation Oncology | Admitting: Radiation Oncology

## 2022-10-27 ENCOUNTER — Other Ambulatory Visit: Payer: Self-pay

## 2022-10-27 DIAGNOSIS — Z510 Encounter for antineoplastic radiation therapy: Secondary | ICD-10-CM | POA: Diagnosis not present

## 2022-10-27 DIAGNOSIS — Z191 Hormone sensitive malignancy status: Secondary | ICD-10-CM | POA: Diagnosis not present

## 2022-10-27 LAB — RAD ONC ARIA SESSION SUMMARY
Course Elapsed Days: 2
Plan Fractions Treated to Date: 3
Plan Prescribed Dose Per Fraction: 1.8 Gy
Plan Total Fractions Prescribed: 25
Plan Total Prescribed Dose: 45 Gy
Reference Point Dosage Given to Date: 5.4 Gy
Reference Point Session Dosage Given: 1.8 Gy
Session Number: 3

## 2022-10-27 NOTE — Progress Notes (Signed)
Pt here for patient teaching. Pt given Radiation and You booklet and skin care instructions.  Reviewed areas of pertinence such as diarrhea, fatigue, hair loss, nausea and vomiting, sexual and fertility changes, skin changes, and urinary and bladder changes. Pt able to give teach back of to pat skin, use unscented/gentle soap, use baby wipes, have Imodium on hand, drink plenty of water, and sitz bath, avoid applying anything to skin within 4 hours of treatment and to use an electric razor if they must shave. Pt verbalizes understanding of information given and will contact nursing with any questions or concerns.     [email or web address]

## 2022-10-28 ENCOUNTER — Ambulatory Visit
Admission: RE | Admit: 2022-10-28 | Discharge: 2022-10-28 | Disposition: A | Discharge status: Home / Self Care | Payer: Medicare Other | Source: Ambulatory Visit | Attending: Radiation Oncology | Admitting: Radiation Oncology

## 2022-10-28 ENCOUNTER — Other Ambulatory Visit: Payer: Self-pay

## 2022-10-28 DIAGNOSIS — Z191 Hormone sensitive malignancy status: Secondary | ICD-10-CM | POA: Diagnosis not present

## 2022-10-28 DIAGNOSIS — Z510 Encounter for antineoplastic radiation therapy: Secondary | ICD-10-CM | POA: Diagnosis not present

## 2022-10-28 LAB — RAD ONC ARIA SESSION SUMMARY
Course Elapsed Days: 3
Plan Fractions Treated to Date: 4
Plan Prescribed Dose Per Fraction: 1.8 Gy
Plan Total Fractions Prescribed: 25
Plan Total Prescribed Dose: 45 Gy
Reference Point Dosage Given to Date: 7.2 Gy
Reference Point Session Dosage Given: 1.8 Gy
Session Number: 4

## 2022-10-31 ENCOUNTER — Ambulatory Visit
Admission: RE | Admit: 2022-10-31 | Discharge: 2022-10-31 | Disposition: A | Discharge status: Home / Self Care | Payer: Medicare Other | Source: Ambulatory Visit | Attending: Radiation Oncology | Admitting: Radiation Oncology

## 2022-10-31 ENCOUNTER — Other Ambulatory Visit: Payer: Self-pay

## 2022-10-31 DIAGNOSIS — Z510 Encounter for antineoplastic radiation therapy: Secondary | ICD-10-CM | POA: Diagnosis not present

## 2022-10-31 DIAGNOSIS — Z191 Hormone sensitive malignancy status: Secondary | ICD-10-CM | POA: Diagnosis not present

## 2022-10-31 LAB — RAD ONC ARIA SESSION SUMMARY
Course Elapsed Days: 6
Plan Fractions Treated to Date: 5
Plan Prescribed Dose Per Fraction: 1.8 Gy
Plan Total Fractions Prescribed: 25
Plan Total Prescribed Dose: 45 Gy
Reference Point Dosage Given to Date: 9 Gy
Reference Point Session Dosage Given: 1.8 Gy
Session Number: 5

## 2022-11-01 ENCOUNTER — Other Ambulatory Visit: Payer: Self-pay

## 2022-11-01 ENCOUNTER — Ambulatory Visit
Admission: RE | Admit: 2022-11-01 | Discharge: 2022-11-01 | Disposition: A | Discharge status: Home / Self Care | Payer: Medicare Other | Source: Ambulatory Visit | Attending: Radiation Oncology | Admitting: Radiation Oncology

## 2022-11-01 DIAGNOSIS — Z191 Hormone sensitive malignancy status: Secondary | ICD-10-CM | POA: Diagnosis not present

## 2022-11-01 DIAGNOSIS — Z510 Encounter for antineoplastic radiation therapy: Secondary | ICD-10-CM | POA: Diagnosis not present

## 2022-11-01 LAB — RAD ONC ARIA SESSION SUMMARY
Course Elapsed Days: 7
Plan Fractions Treated to Date: 6
Plan Prescribed Dose Per Fraction: 1.8 Gy
Plan Total Fractions Prescribed: 25
Plan Total Prescribed Dose: 45 Gy
Reference Point Dosage Given to Date: 10.8 Gy
Reference Point Session Dosage Given: 1.8 Gy
Session Number: 6

## 2022-11-02 ENCOUNTER — Ambulatory Visit
Admission: RE | Admit: 2022-11-02 | Discharge: 2022-11-02 | Disposition: A | Discharge status: Home / Self Care | Payer: Medicare Other | Source: Ambulatory Visit | Attending: Radiation Oncology | Admitting: Radiation Oncology

## 2022-11-02 ENCOUNTER — Other Ambulatory Visit: Payer: Self-pay

## 2022-11-02 DIAGNOSIS — Z510 Encounter for antineoplastic radiation therapy: Secondary | ICD-10-CM | POA: Diagnosis not present

## 2022-11-02 DIAGNOSIS — Z191 Hormone sensitive malignancy status: Secondary | ICD-10-CM | POA: Diagnosis not present

## 2022-11-02 LAB — RAD ONC ARIA SESSION SUMMARY
Course Elapsed Days: 8
Plan Fractions Treated to Date: 7
Plan Prescribed Dose Per Fraction: 1.8 Gy
Plan Total Fractions Prescribed: 25
Plan Total Prescribed Dose: 45 Gy
Reference Point Dosage Given to Date: 12.6 Gy
Reference Point Session Dosage Given: 1.8 Gy
Session Number: 7

## 2022-11-03 ENCOUNTER — Other Ambulatory Visit: Payer: Self-pay

## 2022-11-03 ENCOUNTER — Ambulatory Visit
Admission: RE | Admit: 2022-11-03 | Discharge: 2022-11-03 | Disposition: A | Discharge status: Home / Self Care | Payer: Medicare Other | Source: Ambulatory Visit | Attending: Radiation Oncology | Admitting: Radiation Oncology

## 2022-11-03 DIAGNOSIS — Z510 Encounter for antineoplastic radiation therapy: Secondary | ICD-10-CM | POA: Diagnosis not present

## 2022-11-03 DIAGNOSIS — Z191 Hormone sensitive malignancy status: Secondary | ICD-10-CM | POA: Diagnosis not present

## 2022-11-03 LAB — RAD ONC ARIA SESSION SUMMARY
Course Elapsed Days: 9
Plan Fractions Treated to Date: 8
Plan Prescribed Dose Per Fraction: 1.8 Gy
Plan Total Fractions Prescribed: 25
Plan Total Prescribed Dose: 45 Gy
Reference Point Dosage Given to Date: 14.4 Gy
Reference Point Session Dosage Given: 1.8 Gy
Session Number: 8

## 2022-11-04 ENCOUNTER — Ambulatory Visit
Admission: RE | Admit: 2022-11-04 | Discharge: 2022-11-04 | Disposition: A | Discharge status: Home / Self Care | Payer: Medicare Other | Source: Ambulatory Visit | Attending: Radiation Oncology | Admitting: Radiation Oncology

## 2022-11-04 ENCOUNTER — Other Ambulatory Visit: Payer: Self-pay | Admitting: Radiation Oncology

## 2022-11-04 ENCOUNTER — Other Ambulatory Visit: Payer: Self-pay

## 2022-11-04 DIAGNOSIS — Z191 Hormone sensitive malignancy status: Secondary | ICD-10-CM | POA: Diagnosis not present

## 2022-11-04 DIAGNOSIS — Z5111 Encounter for antineoplastic chemotherapy: Secondary | ICD-10-CM | POA: Diagnosis not present

## 2022-11-04 DIAGNOSIS — Z510 Encounter for antineoplastic radiation therapy: Secondary | ICD-10-CM | POA: Diagnosis not present

## 2022-11-04 LAB — RAD ONC ARIA SESSION SUMMARY
Course Elapsed Days: 10
Plan Fractions Treated to Date: 9
Plan Prescribed Dose Per Fraction: 1.8 Gy
Plan Total Fractions Prescribed: 25
Plan Total Prescribed Dose: 45 Gy
Reference Point Dosage Given to Date: 16.2 Gy
Reference Point Session Dosage Given: 1.8 Gy
Session Number: 9

## 2022-11-04 MED ORDER — TAMSULOSIN HCL 0.4 MG PO CAPS: 0.4000 mg | ORAL_CAPSULE | Freq: Every day | ORAL | 5 refills | Status: DC

## 2022-11-07 ENCOUNTER — Ambulatory Visit
Admission: RE | Admit: 2022-11-07 | Discharge: 2022-11-07 | Disposition: A | Discharge status: Home / Self Care | Payer: Medicare Other | Source: Ambulatory Visit | Attending: Radiation Oncology | Admitting: Radiation Oncology

## 2022-11-07 ENCOUNTER — Other Ambulatory Visit: Payer: Self-pay

## 2022-11-07 DIAGNOSIS — Z191 Hormone sensitive malignancy status: Secondary | ICD-10-CM | POA: Diagnosis not present

## 2022-11-07 DIAGNOSIS — Z510 Encounter for antineoplastic radiation therapy: Secondary | ICD-10-CM | POA: Diagnosis not present

## 2022-11-07 LAB — RAD ONC ARIA SESSION SUMMARY
Course Elapsed Days: 13
Plan Fractions Treated to Date: 10
Plan Prescribed Dose Per Fraction: 1.8 Gy
Plan Total Fractions Prescribed: 25
Plan Total Prescribed Dose: 45 Gy
Reference Point Dosage Given to Date: 18 Gy
Reference Point Session Dosage Given: 1.8 Gy
Session Number: 10

## 2022-11-08 ENCOUNTER — Other Ambulatory Visit: Payer: Self-pay

## 2022-11-08 ENCOUNTER — Ambulatory Visit
Admission: RE | Admit: 2022-11-08 | Discharge: 2022-11-08 | Disposition: A | Discharge status: Home / Self Care | Payer: Medicare Other | Source: Ambulatory Visit | Attending: Radiation Oncology | Admitting: Radiation Oncology

## 2022-11-08 DIAGNOSIS — Z510 Encounter for antineoplastic radiation therapy: Secondary | ICD-10-CM | POA: Diagnosis not present

## 2022-11-08 DIAGNOSIS — Z191 Hormone sensitive malignancy status: Secondary | ICD-10-CM | POA: Diagnosis not present

## 2022-11-08 LAB — RAD ONC ARIA SESSION SUMMARY
Course Elapsed Days: 14
Plan Fractions Treated to Date: 11
Plan Prescribed Dose Per Fraction: 1.8 Gy
Plan Total Fractions Prescribed: 25
Plan Total Prescribed Dose: 45 Gy
Reference Point Dosage Given to Date: 19.8 Gy
Reference Point Session Dosage Given: 1.8 Gy
Session Number: 11

## 2022-11-09 ENCOUNTER — Other Ambulatory Visit: Payer: Self-pay

## 2022-11-09 ENCOUNTER — Ambulatory Visit
Admission: RE | Admit: 2022-11-09 | Discharge: 2022-11-09 | Disposition: A | Discharge status: Home / Self Care | Payer: Medicare Other | Source: Ambulatory Visit | Attending: Radiation Oncology | Admitting: Radiation Oncology

## 2022-11-09 DIAGNOSIS — Z510 Encounter for antineoplastic radiation therapy: Secondary | ICD-10-CM | POA: Diagnosis not present

## 2022-11-09 DIAGNOSIS — Z191 Hormone sensitive malignancy status: Secondary | ICD-10-CM | POA: Diagnosis not present

## 2022-11-09 LAB — RAD ONC ARIA SESSION SUMMARY
Course Elapsed Days: 15
Plan Fractions Treated to Date: 12
Plan Prescribed Dose Per Fraction: 1.8 Gy
Plan Total Fractions Prescribed: 25
Plan Total Prescribed Dose: 45 Gy
Reference Point Dosage Given to Date: 21.6 Gy
Reference Point Session Dosage Given: 1.8 Gy
Session Number: 12

## 2022-11-10 ENCOUNTER — Other Ambulatory Visit: Payer: Self-pay

## 2022-11-10 ENCOUNTER — Ambulatory Visit
Admission: RE | Admit: 2022-11-10 | Discharge: 2022-11-10 | Disposition: A | Discharge status: Home / Self Care | Payer: Medicare Other | Source: Ambulatory Visit | Attending: Radiation Oncology | Admitting: Radiation Oncology

## 2022-11-10 DIAGNOSIS — Z510 Encounter for antineoplastic radiation therapy: Secondary | ICD-10-CM | POA: Diagnosis not present

## 2022-11-10 DIAGNOSIS — Z191 Hormone sensitive malignancy status: Secondary | ICD-10-CM | POA: Diagnosis not present

## 2022-11-10 LAB — RAD ONC ARIA SESSION SUMMARY
Course Elapsed Days: 16
Plan Fractions Treated to Date: 13
Plan Prescribed Dose Per Fraction: 1.8 Gy
Plan Total Fractions Prescribed: 25
Plan Total Prescribed Dose: 45 Gy
Reference Point Dosage Given to Date: 23.4 Gy
Reference Point Session Dosage Given: 1.8 Gy
Session Number: 13

## 2022-11-11 ENCOUNTER — Ambulatory Visit
Admission: RE | Admit: 2022-11-11 | Discharge: 2022-11-11 | Disposition: A | Discharge status: Home / Self Care | Payer: Medicare Other | Source: Ambulatory Visit | Attending: Radiation Oncology | Admitting: Radiation Oncology

## 2022-11-11 ENCOUNTER — Other Ambulatory Visit: Payer: Self-pay

## 2022-11-11 DIAGNOSIS — C61 Malignant neoplasm of prostate: Secondary | ICD-10-CM | POA: Diagnosis present

## 2022-11-11 DIAGNOSIS — R300 Dysuria: Secondary | ICD-10-CM | POA: Diagnosis not present

## 2022-11-11 DIAGNOSIS — R350 Frequency of micturition: Secondary | ICD-10-CM | POA: Diagnosis not present

## 2022-11-11 DIAGNOSIS — Z510 Encounter for antineoplastic radiation therapy: Secondary | ICD-10-CM | POA: Diagnosis not present

## 2022-11-11 DIAGNOSIS — Z191 Hormone sensitive malignancy status: Secondary | ICD-10-CM | POA: Diagnosis not present

## 2022-11-11 LAB — RAD ONC ARIA SESSION SUMMARY
Course Elapsed Days: 17
Plan Fractions Treated to Date: 14
Plan Prescribed Dose Per Fraction: 1.8 Gy
Plan Total Fractions Prescribed: 25
Plan Total Prescribed Dose: 45 Gy
Reference Point Dosage Given to Date: 25.2 Gy
Reference Point Session Dosage Given: 1.8 Gy
Session Number: 14

## 2022-11-14 ENCOUNTER — Ambulatory Visit
Admission: RE | Admit: 2022-11-14 | Discharge: 2022-11-14 | Disposition: A | Discharge status: Home / Self Care | Payer: Medicare Other | Source: Ambulatory Visit | Attending: Radiation Oncology | Admitting: Radiation Oncology

## 2022-11-14 ENCOUNTER — Other Ambulatory Visit: Payer: Self-pay

## 2022-11-14 DIAGNOSIS — R300 Dysuria: Secondary | ICD-10-CM | POA: Diagnosis not present

## 2022-11-14 DIAGNOSIS — Z510 Encounter for antineoplastic radiation therapy: Secondary | ICD-10-CM | POA: Diagnosis not present

## 2022-11-14 DIAGNOSIS — Z191 Hormone sensitive malignancy status: Secondary | ICD-10-CM | POA: Diagnosis not present

## 2022-11-14 DIAGNOSIS — R350 Frequency of micturition: Secondary | ICD-10-CM | POA: Diagnosis not present

## 2022-11-14 DIAGNOSIS — C61 Malignant neoplasm of prostate: Secondary | ICD-10-CM | POA: Diagnosis not present

## 2022-11-14 LAB — RAD ONC ARIA SESSION SUMMARY
Course Elapsed Days: 20
Plan Fractions Treated to Date: 15
Plan Prescribed Dose Per Fraction: 1.8 Gy
Plan Total Fractions Prescribed: 25
Plan Total Prescribed Dose: 45 Gy
Reference Point Dosage Given to Date: 27 Gy
Reference Point Session Dosage Given: 1.8 Gy
Session Number: 15

## 2022-11-15 ENCOUNTER — Other Ambulatory Visit: Payer: Self-pay

## 2022-11-15 ENCOUNTER — Ambulatory Visit
Admission: RE | Admit: 2022-11-15 | Discharge: 2022-11-15 | Disposition: A | Discharge status: Home / Self Care | Payer: Medicare Other | Source: Ambulatory Visit | Attending: Radiation Oncology | Admitting: Radiation Oncology

## 2022-11-15 DIAGNOSIS — R350 Frequency of micturition: Secondary | ICD-10-CM | POA: Diagnosis not present

## 2022-11-15 DIAGNOSIS — R300 Dysuria: Secondary | ICD-10-CM | POA: Diagnosis not present

## 2022-11-15 DIAGNOSIS — C61 Malignant neoplasm of prostate: Secondary | ICD-10-CM | POA: Diagnosis not present

## 2022-11-15 DIAGNOSIS — Z510 Encounter for antineoplastic radiation therapy: Secondary | ICD-10-CM | POA: Diagnosis not present

## 2022-11-15 DIAGNOSIS — Z191 Hormone sensitive malignancy status: Secondary | ICD-10-CM | POA: Diagnosis not present

## 2022-11-15 LAB — RAD ONC ARIA SESSION SUMMARY
Course Elapsed Days: 21
Plan Fractions Treated to Date: 16
Plan Prescribed Dose Per Fraction: 1.8 Gy
Plan Total Fractions Prescribed: 25
Plan Total Prescribed Dose: 45 Gy
Reference Point Dosage Given to Date: 28.8 Gy
Reference Point Session Dosage Given: 1.8 Gy
Session Number: 16

## 2022-11-16 ENCOUNTER — Telehealth: Payer: Self-pay

## 2022-11-16 ENCOUNTER — Ambulatory Visit
Admission: RE | Admit: 2022-11-16 | Discharge: 2022-11-16 | Disposition: A | Discharge status: Home / Self Care | Payer: Medicare Other | Source: Ambulatory Visit | Attending: Radiation Oncology | Admitting: Radiation Oncology

## 2022-11-16 ENCOUNTER — Other Ambulatory Visit: Payer: Self-pay

## 2022-11-16 ENCOUNTER — Ambulatory Visit
Admission: RE | Admit: 2022-11-16 | Discharge: 2022-11-16 | Disposition: A | Discharge status: Home / Self Care | Payer: Medicare Other | Source: Ambulatory Visit

## 2022-11-16 ENCOUNTER — Other Ambulatory Visit: Payer: Self-pay | Admitting: Urology

## 2022-11-16 DIAGNOSIS — Z510 Encounter for antineoplastic radiation therapy: Secondary | ICD-10-CM | POA: Diagnosis not present

## 2022-11-16 DIAGNOSIS — R300 Dysuria: Secondary | ICD-10-CM | POA: Diagnosis not present

## 2022-11-16 DIAGNOSIS — C61 Malignant neoplasm of prostate: Secondary | ICD-10-CM

## 2022-11-16 DIAGNOSIS — R350 Frequency of micturition: Secondary | ICD-10-CM | POA: Diagnosis not present

## 2022-11-16 DIAGNOSIS — Z191 Hormone sensitive malignancy status: Secondary | ICD-10-CM | POA: Diagnosis not present

## 2022-11-16 LAB — URINALYSIS, COMPLETE (UACMP) WITH MICROSCOPIC
Bilirubin Urine: NEGATIVE
Glucose, UA: NEGATIVE mg/dL
Hgb urine dipstick: NEGATIVE
Ketones, ur: NEGATIVE mg/dL
Leukocytes,Ua: NEGATIVE
Nitrite: NEGATIVE
Protein, ur: 100 mg/dL — AB
Specific Gravity, Urine: 1.017 (ref 1.005–1.030)
pH: 5 (ref 5.0–8.0)

## 2022-11-16 LAB — RAD ONC ARIA SESSION SUMMARY
Course Elapsed Days: 22
Plan Fractions Treated to Date: 17
Plan Prescribed Dose Per Fraction: 1.8 Gy
Plan Total Fractions Prescribed: 25
Plan Total Prescribed Dose: 45 Gy
Reference Point Dosage Given to Date: 30.6 Gy
Reference Point Session Dosage Given: 1.8 Gy
Session Number: 17

## 2022-11-16 MED ORDER — ONDANSETRON 8 MG PO TBDP: 8.0000 mg | ORAL_TABLET | Freq: Three times a day (TID) | ORAL | 1 refills | Status: DC | PRN

## 2022-11-16 NOTE — Progress Notes (Signed)
Russell Trevino came in to give urine sample for test to r/o urinary tract infection.  Russell Trevino was made aware of return visit.  Russell Trevino stated was headed over to urologist to see if can get free samples of Rapflo to help with urinary symptoms.

## 2022-11-16 NOTE — Telephone Encounter (Signed)
RN called Russell Trevino back to follow up and inform him that his medication for Zofran was called into the pharmacy should be available for pickup in few hours.  And to also pick up the OTC medication that we discussed earlier for his diarrhea and dysuria.  Per Russell Caldron, PA-C advised Russell Trevino to come back to the Cancer center to give urine sample to be tested to r/o UTI.  Expressed the importance of getting the urine sample so results came be available for his visit with Dr. Tammi Klippel on 11/17/2022.  Will give call back in hour to see if he is able to come back or not.  Russell Trevino verbalized understand of this information and said he will try to come back.

## 2022-11-16 NOTE — Progress Notes (Signed)
RN saw patient after radiation treatment this morning.  Mr. Ostermeyer had complaints of nausea/vomiting, diarrhea, and urinary frequency, nocturia, dysuria, weak stream, and not fully emptying bladder.  Denies fever and hematuria.  Mr. Condello is 4 weeks into radiation treatment for prostate cancer and was informed that these are side effects he would be experiencing at this time.  RN advised patient to take Imodium AD for the diarrhea, increase water intake or try ginger ale for nausea.  Patient said he has tried drinking ginger ale wasn't able to keep down.  Mr. Burghardt will be seen tomorrow 11/17/2022 by Dr. Tammi Klippel for undertreat visit and follow-up.  Informed Ashlyn Bruning, PA-C, since patient left clinic shortly after discussion.  Verified pharmacy of choice so that any prescriptions could be sent in for pick up later by patient.  Awaiting any further orders from [employer], PA-C.

## 2022-11-17 ENCOUNTER — Ambulatory Visit
Admission: RE | Admit: 2022-11-17 | Discharge: 2022-11-17 | Disposition: A | Discharge status: Home / Self Care | Payer: Medicare Other | Source: Ambulatory Visit | Attending: Radiation Oncology | Admitting: Radiation Oncology

## 2022-11-17 ENCOUNTER — Other Ambulatory Visit: Payer: Self-pay

## 2022-11-17 ENCOUNTER — Ambulatory Visit: Payer: MEDICARE | Primary: Emergency Medicine

## 2022-11-17 DIAGNOSIS — R300 Dysuria: Secondary | ICD-10-CM | POA: Diagnosis not present

## 2022-11-17 DIAGNOSIS — R350 Frequency of micturition: Secondary | ICD-10-CM | POA: Diagnosis not present

## 2022-11-17 DIAGNOSIS — Z191 Hormone sensitive malignancy status: Secondary | ICD-10-CM | POA: Diagnosis not present

## 2022-11-17 DIAGNOSIS — C61 Malignant neoplasm of prostate: Secondary | ICD-10-CM | POA: Diagnosis not present

## 2022-11-17 DIAGNOSIS — Z510 Encounter for antineoplastic radiation therapy: Secondary | ICD-10-CM | POA: Diagnosis not present

## 2022-11-17 LAB — RAD ONC ARIA SESSION SUMMARY
Course Elapsed Days: 23
Plan Fractions Treated to Date: 18
Plan Prescribed Dose Per Fraction: 1.8 Gy
Plan Total Fractions Prescribed: 25
Plan Total Prescribed Dose: 45 Gy
Reference Point Dosage Given to Date: 32.4 Gy
Reference Point Session Dosage Given: 1.8 Gy
Session Number: 18

## 2022-11-18 ENCOUNTER — Telehealth: Payer: Self-pay

## 2022-11-18 ENCOUNTER — Ambulatory Visit
Admission: RE | Admit: 2022-11-18 | Discharge: 2022-11-18 | Disposition: A | Discharge status: Home / Self Care | Payer: Medicare Other | Source: Ambulatory Visit | Attending: Radiation Oncology | Admitting: Radiation Oncology

## 2022-11-18 ENCOUNTER — Other Ambulatory Visit: Payer: Self-pay | Admitting: Urology

## 2022-11-18 ENCOUNTER — Other Ambulatory Visit: Payer: Self-pay

## 2022-11-18 DIAGNOSIS — Z510 Encounter for antineoplastic radiation therapy: Secondary | ICD-10-CM | POA: Diagnosis not present

## 2022-11-18 DIAGNOSIS — Z191 Hormone sensitive malignancy status: Secondary | ICD-10-CM | POA: Diagnosis not present

## 2022-11-18 DIAGNOSIS — R300 Dysuria: Secondary | ICD-10-CM | POA: Diagnosis not present

## 2022-11-18 DIAGNOSIS — C61 Malignant neoplasm of prostate: Secondary | ICD-10-CM | POA: Diagnosis not present

## 2022-11-18 DIAGNOSIS — R350 Frequency of micturition: Secondary | ICD-10-CM | POA: Diagnosis not present

## 2022-11-18 LAB — RAD ONC ARIA SESSION SUMMARY
Course Elapsed Days: 24
Plan Fractions Treated to Date: 19
Plan Prescribed Dose Per Fraction: 1.8 Gy
Plan Total Fractions Prescribed: 25
Plan Total Prescribed Dose: 45 Gy
Reference Point Dosage Given to Date: 34.2 Gy
Reference Point Session Dosage Given: 1.8 Gy
Session Number: 19

## 2022-11-18 MED ORDER — SULFAMETHOXAZOLE-TRIMETHOPRIM 800-160 MG PO TABS: 1.0000 | ORAL_TABLET | Freq: Two times a day (BID) | ORAL | 0 refills | Status: DC

## 2022-11-18 NOTE — Telephone Encounter (Signed)
RN spoke with Mr. Russell Trevino during his PUT visit 11/17/2022 and he said that the Zofran helped with nausea/vomiting and the Imodium helped with his diarrhea.  Mr. Russell Trevino did pick up some AZO for dysuria, but hadn't use any said he will try later that day.  I called over to [facility] before sending him over and the triage nurse said they no longer have Rapaflo samples due to changing to a generic form of the Rapaflo.  I explained to the triage nurse about Mr. Russell Trevino symptoms and that we were waiting for results.  I explained this to Mr. Russell Trevino and asked when his next appointment was with Dr. Jeffie Pollock he said he will be going 12/07/2022 for his ADT shot.  I asked if he would like me to see if he could get in for visit to discuss his urinary symptoms and to get order for medication to help with urine flow he said not right now.  He wanted to wait for results of the urinalysis and urine culture.  Mr. Russell Trevino was appreciative of the help getting medication.  No other complaints at this time.  Russell Bruning, PA-C made aware.

## 2022-11-18 NOTE — Progress Notes (Incomplete)
Cardiology Office Note:    Date:  05/19/2022   ID:  AADIN GAUT, DOB 1951/08/04, MRN [id number]  PCP:  System, Provider Not In  Cardiologist:  Buford Dresser, MD PhD  Referring MD: No ref. provider found   CC: follow up   History of Present Illness:    Russell Trevino is a 72 y.o. male with a hx of CAD s/p prior CABG, NSTEMI 10/2018, ischemic cardiomyopathy with EF 40-45% who is seen for follow up.   Cardiac history: Hospitalized 10/2018 for NSTEMI and heart failure symptoms. Severe native CAD, no intervenable targets on cath. Recommended for medication management. Prior history notable for NSTEMI in 2006 that lead to CABG, with LIMA-LAD, L Radial-OM-D1, SVG-PDA. Noted to have ischemic cardiomyopathy with EF 40-45%. He was readmitted and discharged with heart failure after running out of furosemide for two weeks.    He saw Laurann Montana, NP on 12/28/2021 where he was referred to cardiac rehab. His hospitalization was reviewed at length. Per her notes: He was admitted 12/16/2021 after presenting with chest pain. It was unclear which of his medications he was or was not taking. Echo 12/11/2021 LVEF 25% with septal and inferior wall motion abnormalities worse than previous.  He underwent LHC showing native left main occlusion with patent arterial graft.  Plan was to potentially add ARNI in outpatient setting.  He followed up with Laurann Montana, NP on 04/25/2022 where he was doing well with stable exertional dyspnea. He needed pre-op clearance for prostate biopsy.  Today: He states that he is feeling okay. However, he is concerned that after waking up in the morning he still feels tired, which is bothersome. Even with following his exercise routines he did not notice much improvement in his fatigue. He denies snoring. Nobody has told him he stops breathing when he sleeps.   Lately he has not had as much issues with swelling. If he does he will take Lasix, but he has not needed to take it in  a while.  For exercise he walks 3 times a week, about 20-25 minutes a day. No anginal symptoms. Lately he has been feeling some increase in his exercise tolerance, but he still struggles with his overall fatigue.  In clinic today his BP is 130/70 which he notes is higher for him. At home his blood pressure is usually well controlled in the 338'S systolic.   This morning his blood sugar was 164. He is no longer taking Jardiance. Currently on 0.5 mg dose of Ozempic. He has not yet noticed any significant weight loss or other benefits.  He denies any palpitations, chest pain, or shortness of breath. No lightheadedness, headaches, syncope, orthopnea, or PND.    Past Medical History:  Diagnosis Date   BPH (benign prostatic hypertrophy)    Cardiomyopathy, ischemic    Coronary artery disease    at [facility], [place] 2009 (GRAFTS PATENT), cath 05/2018 w/ SVG-PDA 100%>>med rx   Diabetes mellitus, type 2 (HCC)    Frequency of urination    GERD (gastroesophageal reflux disease)    Gross hematuria    History of CHF (congestive heart failure) 2007   History of kidney stones    History of myocardial infarction 2006, 2019   2006>>CABG, 2019 cath w/ med rx   History of urinary retention    Hyperlipidemia    Nocturia    Renal calculi    BILATERAL PER CT SCAN--  NON-OBSTRUCTIVE   S/P CABG x 4 2006    Past Surgical  History:  Procedure Laterality Date   CARDIAC CATHETERIZATION  01/17/2008   GRAFTS PATENT / EF 40%,  DR SANTOS ([facility]TIST)   CORONARY ARTERY BYPASS GRAFT  04/2005   LIMA-LAD, L radial-OM-D1 and SVG-PDA   CYSTO / RIGHT URETERAL STENT PLACEMENT  10-08-1999   CYSTO/  REMOVAL BLADDER STONES  01-14-2010   CYSTOSCOPY  10/16/2012   Procedure: CYSTOSCOPY;  Surgeon: Malka So, MD;  Location: [facility];  Service: Urology;  Laterality: N/A;  Cystoscopy with Clot Evacuation and fulguration bladder neck.   EXTRACORPOREAL SHOCK WAVE LITHOTRIPSY  10-15-2009   RIGHT   KNEE  ARTHROSCOPY  1978   RIGHT   LEFT HEART CATH AND CORS/GRAFTS ANGIOGRAPHY N/A 12/17/2021   Procedure: LEFT HEART CATH AND CORS/GRAFTS ANGIOGRAPHY;  Surgeon: [name], MD;  Location: [place] [facility];  Service: Cardiovascular;  Laterality: N/A;   LEFT SHOULDER SURGERY   2002   LUMBAR FUSION  09-10-2008   L4 - L5   PERCUTANEOUS NEPHROSTOLITHOTOMY  1979   PROSTATE BIOPSY  10/16/2012   Procedure: PROSTATE BIOPSY;  Surgeon: Malka So, MD;  Location: [facility];  Service: Urology;  Laterality: N/A;  Through ultrasound.   RIGHT/LEFT HEART CATH AND CORONARY/GRAFT ANGIOGRAPHY N/A 10/26/2018   Procedure: RIGHT/LEFT HEART CATH AND CORONARY/GRAFT ANGIOGRAPHY;  Surgeon: Martinique, Peter M, MD;  Location: [place] [facility];  Service: Cardiovascular;  Laterality: N/A;   SHOULDER ARTHROSCOPY W/ SUBACROMIAL DECOMPRESSION AND DISTAL CLAVICLE EXCISION  10-11-2004   AND PARTIAL ROTATOR CUFF REPAIR   TRANSTHORACIC ECHOCARDIOGRAM  81/82/9937   LV SYSTOLIC FUNCTION MILDLY REDUCED/ EF 48%/ LV FILLING PATTERN  IS IMPAIRED/ HYPOKINESIS IN THE MID AND BASALAR SEPTUM & ANTEROSEPTUM   TURP VAPORIZATION  01-19-2010   BPH W/ BOO   URETEROLITHOTOMY  1976    Current Medications: Current Outpatient Medications on File Prior to Visit  Medication Sig   aspirin 81 MG EC tablet Take 1 tablet (81 mg total) by mouth in the morning.   clopidogrel (PLAVIX) 75 MG tablet Take 1 tablet (75 mg total) by mouth daily.   cyclobenzaprine (FLEXERIL) 10 MG tablet Take 10 mg by mouth at bedtime as needed for muscle spasms.   ezetimibe (ZETIA) 10 MG tablet Take 1 tablet (10 mg total) by mouth every evening.   finasteride (PROSCAR) 5 MG tablet TAKE 1 TABLET(5 MG) BY MOUTH DAILY (Patient taking differently: Take 5 mg by mouth at bedtime.)   furosemide (LASIX) 40 MG tablet Take 40 mg by mouth as needed.   gabapentin (NEURONTIN) 100 MG capsule Take 1 cap in morning and 2 cap HS (Patient taking differently: 100 mg daily as  needed (for neuropathy).)   insulin glargine (LANTUS SOLOSTAR) 100 UNIT/ML Solostar Pen Inject 30 Units into the skin 2 (two) times daily.   Insulin Pen Needle 31G X 6 MM MISC UAD for insulin pen   Insulin Syringe-Needle U-100 (BD VEO INSULIN SYRINGE U/F) 31G X 15/64" 1 ML MISC use as directed TO INJECT INSULIN  twice a day   isosorbide mononitrate (IMDUR) 120 MG 24 hr tablet Take 1 tablet (120 mg total) by mouth 2 (two) times daily.   metoprolol succinate (TOPROL-XL) 50 MG 24 hr tablet Take 1 tablet (50 mg total) by mouth daily at 12 noon. Take with or immediately following a meal.   nitroGLYCERIN (NITROSTAT) 0.4 MG SL tablet PLACE 1 TABLET UNDER THE TONGUE EVERY 5 MINS FOR CHEST FOR PAIN   ranolazine (RANEXA) 500 MG 12  hr tablet Take 1 tablet (500 mg total) by mouth 2 (two) times daily.   rosuvastatin (CRESTOR) 40 MG tablet Take 1 tablet (40 mg total) by mouth daily.   sacubitril-valsartan (ENTRESTO) 49-51 MG Take 1 tablet by mouth 2 (two) times daily.   Semaglutide,0.25 or 0.'5MG'$ /DOS, (OZEMPIC, 0.25 OR 0.5 MG/DOSE,) 2 MG/1.5ML SOPN Inject 0.5 mg into the skin every Wednesday.   No current facility-administered medications on file prior to visit.     Allergies:   Patient has no known allergies.   Social History   Tobacco Use   Smoking status: Never   Smokeless tobacco: Never  Vaping Use   Vaping Use: Never used  Substance Use Topics   Alcohol use: Yes    Alcohol/week: 2.0 standard drinks of alcohol    Types: 2 Cans of beer per week   Drug use: No    Family History: The patient's family history includes Diabetes in his father; Lung cancer in his mother. There is no history of Stomach cancer, Colon cancer, Rectal cancer, Esophageal cancer, or Pancreatic cancer.  ROS:   Please see the history of present illness.   (+) Fatigue (+) Daytime somnolence Additional pertinent ROS negative except as noted in HPI.    EKGs/Labs/Other Studies Reviewed:    The following studies were  reviewed today:  Left Heart Cath  12/17/2021:   Mid LM to Dist LM lesion is 100% stenosed.   Prox LAD lesion is 100% stenosed.   Prox Cx lesion is 100% stenosed.   Prox RCA to Dist RCA lesion is 100% stenosed.   Dist LAD lesion is 60% stenosed.   LV end diastolic pressure is normal.   1.  Patent LIMA to LAD with radial off the LIMA graft to diagonal and obtuse marginal branches. 2.  Vein graft right coronary artery known occluded and not imaged. 3.  Severe native vessel disease with known occlusion of right coronary artery which was not imaged on the study and newly occluded left main lesion. 4.  LVEDP of 16 mmHg.   The results were reviewed with Dr. Gasper Sells and optimal medical therapy will be pursued.  Diagnostic Dominance: Right    Echo  12/17/2021: Sonographer Comments: Image acquisition challenging due to patient body habitus.  IMPRESSIONS    1. Left ventricular ejection fraction, by estimation, is 20 to 25%. The  left ventricle has severely decreased function. The left ventricle  demonstrates regional wall motion abnormalities (see scoring  diagram/findings for description). The left  ventricular internal cavity size was moderately dilated. Left ventricular  diastolic parameters are indeterminate.   2. Right ventricular systolic function is moderately reduced. The right  ventricular size is normal.   3. Left atrial size was mildly dilated.   4. The mitral valve is grossly normal. Trivial mitral valve  regurgitation. No evidence of mitral stenosis.   5. The aortic valve was not well visualized. Aortic valve regurgitation  is not visualized. No aortic stenosis is present.   6. Aortic dilatation noted. There is mild dilatation of the ascending  aorta, measuring 41 mm.   Comparison(s): Changes from prior study are noted.   Conclusion(s)/Recommendation(s): Wall motion better seen on current study. Diffuse hypokinesis with akinesis predominantly in the inferoseptal and  inferior walls. No evidence of LV thrombus. Severely reduced LVEF, 20-25%.   Echo 10/26/18 1. The left ventricle has mild-moderately reduced systolic function, with an ejection fraction of 40-45%. The cavity size was severely dilated. Left ventricular diastolic Doppler parameters are consistent  with pseudonormalization Elevated left ventricular end-diastolic pressure.  2. Poor images preclude accurate assessment of wall motion even with definity.  3. The right ventricle has normal systolic function. The cavity was normal. There is no increase in right ventricular wall thickness.  4. Left atrial size was moderately dilated.  5. The mitral valve is normal in structure. Mitral valve regurgitation is moderate by color flow Doppler.  6. The tricuspid valve is normal in structure.  7. The aortic valve is tricuspid.  8. The pulmonic valve was normal in structure.  9. There is mild dilatation of the ascending aorta measuring 38 mm. 10. Right atrial pressure is estimated at 3 mmHg.  R/LHC 10/26/18 Mid LM to Dist LM lesion is 95% stenosed. Prox LAD lesion is 100% stenosed. Prox Cx lesion is 100% stenosed. Prox RCA to Dist RCA lesion is 100% stenosed. LV end diastolic pressure is normal. Hemodynamic findings consistent with mild pulmonary hypertension.   1. Occlusive native vessel CAD.     -95% distal left main    - 100% proximal LAD after the first septal perforator    - 100% LCx after a very small OM1.    - 100% proximal RCA 2. Patent LIMA to the LAD 3. Patent free radial graft arising from the mid LIMA graft and supplying the first diagonal and OM 4. SVG to RCA is known to be occluded. The distal RCA is supplied by collaterals.  5. Normal LV filling pressures 6. Mild pulmonary HTN. 7. Preserved cardiac output.   Plan: continue medical therapy for CHF. Will switch IV lasix to po. Stop IV Ntg and heparin.   EKG:  EKG is personally reviewed.   05/19/2022:  EKG was not ordered. 01/28/2021: NSR,  iLBBB, nonspecific ST changes 08/30/19: sinus bradycardia at 56 bpm, iLBBB. Inferior/lateral ST changes similar location but more pronounced than prior  Recent Labs: 12/17/2021: TSH 1.259 12/24/2021: ALT 16; Hemoglobin 14.1; Platelets 184.0 04/25/2022: BNP 7.3; BUN 43; Creatinine, Ser 2.20; Potassium 5.6; Sodium 135   Recent Lipid Panel    Component Value Date/Time   CHOL 345 (H) 12/17/2021 0118   TRIG 338 (H) 12/17/2021 0118   HDL 46 12/17/2021 0118   CHOLHDL 7.5 12/17/2021 0118   VLDL 68 (H) 12/17/2021 0118   LDLCALC 231 (H) 12/17/2021 0118   LDLDIRECT 66.0 03/24/2021 0859    Physical Exam:    VS:  BP 130/70 (BP Location: Right Arm, Patient Position: Sitting, Cuff Size: Normal)   Pulse 63   Ht 6' (1.829 m)   Wt 248 lb (112.5 kg)   SpO2 97%   BMI 33.63 kg/m     Wt Readings from Last 3 Encounters:  05/19/22 248 lb (112.5 kg)  04/25/22 247 lb (112 kg)  12/28/21 246 lb (111.6 kg)   GEN: Well nourished, well developed in no acute distress HEENT: Normal, moist mucous membranes NECK: No JVD CARDIAC: regular rhythm, normal S1 and S2, no rubs or gallops. No murmur. VASCULAR: Radial and DP pulses 2+ bilaterally. No carotid bruits RESPIRATORY:  Clear to auscultation without rales, wheezing or rhonchi  ABDOMEN: Soft, non-tender, non-distended MUSCULOSKELETAL:  Ambulates independently SKIN: Warm and dry, 1+ LLE pitting edema (chronic), no significant RLE edema NEUROLOGIC:  Alert and oriented x 3. No focal neuro deficits noted. PSYCHIATRIC:  Normal affect   ASSESSMENT:    1. Daytime somnolence   2. Coronary artery disease of native artery of native heart with stable angina pectoris ([place])   3. Hx of CABG  4. Type 2 diabetes mellitus with other circulatory complication, with long-term current use of insulin ([place])   5. Chronic combined systolic and diastolic heart failure ([place])   6. Essential hypertension   7. Hyperlipidemia LDL goal <70     PLAN:    Daytime  somnolence: -denies snoring, but has never been checked for sleep apnea -concern is that he is tired when he wakes up, not gradually tired as the day goes on. This suggests poor quality sleep -will order home sleep study today, STOPBANG=5  CAD s/p CABG 2006: known SVG-PDA is occluded, other grafts are patent. No intervenable targets. Severe native disease -most recent cath 12/2021 as above -see prior discussion, continuing DAPT indefinitely -on metoprolol succinate 50 mg daily -on imdur 120 mg BID, feels better than taking daily -on ranexa 500 mg BID -previously on amlodipine as antianginal, not on current med list -has sublingual nitroglycerin -counseled on life threatening hypotension with isosorbide, nitroglycerin and sildenafil/erectile dysfunction meds in this class. Would not use PDE5i given this. -no recent chest pain. Actually slowly increasing his activity without symptoms  Chronic systolic and diastolic heart failure Ischemic cardiomyopathy -NYHA class II symptoms today, stable to improving with exercise -most recent EF 20-25% 12/2021, was off of medications. -continue metoprolol succinate 50 mg daily -tolerating entresto 49-51 mg BID -continue lasix 40 mg as needed. Has CKD stage 3b, previously referred to nephrology, does not appear that this occurred. -educated on heart failure, including daily weights, sodium and fluid guidelines, signs/symptoms to watch for, diet and activity recommendations -on empagliflozin (SGLT2i) previously. This was stopped when he was started on semaglutide. He is on only 0.5 mg weekly dose semaglutide. From a cardiac perspective, I would recommend intensifying semaglutide dose and restarting SGLT2i. Last GFR 35 04/27/22 per outside records. He may need to have his insulin adjusted with these changes, he will discuss with his PCP Dr. Gwyneth Sprout. -he [facility] and [facility]. His PCP is available in Care Everywhere. Agree that there is difficulty in  coordinating care as he has doctors in different locations. -discuss ICD at follow up  Hypertension: at goal of <130/80, continue medications as listed  Hyperlipidemia, mixed: -LDL goal <70. Last LDL 10/2020 80, then LDL 231 12/17/21 (off of medications). Repeat per Care Everywhere 04/27/22 show LDL 87.8. On rosuvastatin 40 mg, on ezetimibe as well.  -would recommend PCSK9i as he is not at goal. Have discussed vascepa as well. Major issue is cost.  Type II diabetes, on insulin and metformin, with chronic kidney disease stage 3. Most recent A1c 9.5 04/27/22 -on Ozempic 0.5 mg weekly, insulin glargine BID -as above, would recommend intensifying Ozempic and adding SGLT2i, may need adjustment in his insulin with these changes.  Secondary prevention lifestyle recommendations: -recommend heart healthy/Mediterranean diet, with whole grains, fruits, vegetable, fish, lean meats, nuts, and olive oil. Limit salt. -recommend moderate walking, 3-5 times/week for 30-50 minutes each session. Aim for at least 150 minutes.week. Goal should be pace of 3 miles/hours, or walking 1.5 miles in 30 minutes -recommend avoidance of tobacco products. Avoid excess alcohol.  Follow up: 6 months or sooner as needed  Medication Adjustments/Labs and Tests Ordered: Current medicines are reviewed at length with the patient today.  Concerns regarding medicines are outlined above.   Orders Placed This Encounter  Procedures   [facility] Sleep Study   No orders of the defined types were placed in this encounter.  Patient Instructions  Medication Instructions:  Your Physician recommend you continue on your  current medication as directed.    *If you need a refill on your cardiac medications before your next appointment, please call your pharmacy*   Lab Work: None ordered today   Testing/Procedures: WatchPAT?  Is a FDA cleared portable home sleep study test that uses a watch and 3 points of contact to monitor 7 different  channels, including your heart rate, oxygen saturations, body position, snoring, and chest motion.  The study is easy to use from the comfort of your own home and accurately detect sleep apnea.  Before bed, you attach the chest sensor, attached the sleep apnea bracelet to your nondominant hand, and attach the finger probe.  After the study, the raw data is downloaded from the watch and scored for apnea events.   For more information: [email or web address]  Patient Testing Instructions:  Do not put battery into the device until bedtime when you are ready to begin the test. Please call the support number if you need assistance after following the instructions below: 24 hour support line- [phone] or [facility] support at [phone] (option 2)  Download the [employer] AmericanWatchPAT One" app through the google play store or App Store  Be sure to turn on or enable access to bluetooth in settlings on your smartphone/ device  Make sure no other bluetooth devices are on and within the vicinity of your smartphone/ device and WatchPAT watch during testing.  Make sure to leave your smart phone/ device plugged in and charging all night.  When ready for bed:  Follow the instructions step by step in the WatchPAT One App to activate the testing device. For additional instructions, including video instruction, visit the WatchPAT One video on Youtube. You can search for [place] One within Youtube (video is 4 minutes and 18 seconds) or enter: https://youtube/watch?[email or web address]_vbiwxE Please note: You will be prompted to enter a Pin to connect via bluetooth when starting the test. The PIN will be assigned to you when you receive the test.  The device is disposable, but it recommended that you retain the device until you receive a call letting you know the study has been received and the results have been interpreted.  We will let you know if the study did not transmit to [place] properly after the test is completed.  You do not need to call us to confirm the receipt of the test.  Please complete the test within 48 hours of receiving PIN.   Frequently Asked Questions:  What is Watch [place] one?  A single use fully disposable home sleep apnea testing device and will not need to be returned after completion.  What are the requirements to use WatchPAT one?  The be able to have a successful watchpat one sleep study, you should have your Watch pat one device, your smart phone, watch pat one app, your PIN number and Internet access What type of phone do I need?  You should have a smart phone that uses Android 5.1 and above or any Iphone with IOS 10 and above How can I download the WatchPAT one app?  Based on your device type search for WatchPAT one app either in google play for android devices or APP store for Iphone's Where will I get my PIN for the study?  Your PIN will be provided by your physician's office. It is used for authentication and if you lose/forget your PIN, please reach out to your providers office.  I do not have Internet at home. Can I do WatchPAT one study?  WatchPAT One  needs Internet connection throughout the night to be able to transmit the sleep data. You can use your home/local internet or your cellular's data package. However, it is always recommended to use home/local Internet. It is estimated that between 20MB-30MB will be used with each study.However, the application will be looking for 80MB space in the phone to start the study.  What happens if I lose internet or bluetooth connection?  During the internet disconnection, your phone will not be able to transmit the sleep data. All the data, will be stored in your phone. As soon as the internet connection is back on, the phone will being sending the sleep data. During the bluetooth disconnection, WatchPAT one will not be able to to send the sleep data to your phone. Data will be kept in the [facility] one until two devices have bluetooth  connection back on. As soon as the connection is back on, WatchPAT one will send the sleep data to the phone.  How long do I need to wear the WatchPAT one?  After you start the study, you should wear the device at least 6 hours.  How far should I keep my phone from the device?  During the night, your phone should be within 15 feet.  What happens if I leave the room for restroom or other reasons?  Leaving the room for any reason will not cause any problem. As soon as your get back to the room, both devices will reconnect and will continue to send the sleep data. Can I use my phone during the sleep study?  Yes, you can use your phone as usual during the study. But it is recommended to put your watchpat one on when you are ready to go to bed.  How will I get my study results?  A soon as you completed your study, your sleep data will be sent to the provider. They will then share the results with you when they are ready.     Follow-Up: At [facility], you and your health needs are our priority.  As part of our continuing mission to provide you with exceptional heart care, we have created designated Provider Care Teams.  These Care Teams include your primary Cardiologist (physician) and Advanced Practice Providers (APPs -  Physician Assistants and Nurse Practitioners) who all work together to provide you with the care you need, when you need it.  We recommend signing up for the patient portal called "MyChart".  Sign up information is provided on this After Visit Summary.  MyChart is used to connect with patients for Virtual Visits (Telemedicine).  Patients are able to view lab/test results, encounter notes, upcoming appointments, etc.  Non-urgent messages can be sent to your provider as well.   To learn more about what you can do with MyChart, go to [email or web address].    Your next appointment:   6 month(s)  The format for your next appointment:   In Person  Provider:   Buford Dresser, MD             [facility],acting as a scribe for Buford Dresser, MD.,have documented all relevant documentation on the behalf of Buford Dresser, MD,as directed by  Buford Dresser, MD while in the presence of Buford Dresser, MD.  I, Buford Dresser, MD, have reviewed all documentation for this visit. The documentation on 05/19/22 for the exam, diagnosis, procedures, and orders are all accurate and complete.  Signed, Buford Dresser, MD PhD 05/19/2022   [facility] 

## 2022-11-19 LAB — URINE CULTURE: Culture: 50000 — AB

## 2022-11-21 ENCOUNTER — Ambulatory Visit
Admission: RE | Admit: 2022-11-21 | Discharge: 2022-11-21 | Disposition: A | Discharge status: Home / Self Care | Payer: Medicare Other | Source: Ambulatory Visit | Attending: Radiation Oncology | Admitting: Radiation Oncology

## 2022-11-21 ENCOUNTER — Other Ambulatory Visit: Payer: Self-pay

## 2022-11-21 ENCOUNTER — Telehealth: Payer: Self-pay

## 2022-11-21 ENCOUNTER — Ambulatory Visit (HOSPITAL_BASED_OUTPATIENT_CLINIC_OR_DEPARTMENT_OTHER): Payer: Medicare Other | Admitting: Cardiology

## 2022-11-21 DIAGNOSIS — Z510 Encounter for antineoplastic radiation therapy: Secondary | ICD-10-CM | POA: Diagnosis not present

## 2022-11-21 DIAGNOSIS — C61 Malignant neoplasm of prostate: Secondary | ICD-10-CM | POA: Diagnosis not present

## 2022-11-21 DIAGNOSIS — R350 Frequency of micturition: Secondary | ICD-10-CM | POA: Diagnosis not present

## 2022-11-21 DIAGNOSIS — Z191 Hormone sensitive malignancy status: Secondary | ICD-10-CM | POA: Diagnosis not present

## 2022-11-21 DIAGNOSIS — R300 Dysuria: Secondary | ICD-10-CM | POA: Diagnosis not present

## 2022-11-21 LAB — RAD ONC ARIA SESSION SUMMARY
Course Elapsed Days: 27
Plan Fractions Treated to Date: 20
Plan Prescribed Dose Per Fraction: 1.8 Gy
Plan Total Fractions Prescribed: 25
Plan Total Prescribed Dose: 45 Gy
Reference Point Dosage Given to Date: 36 Gy
Reference Point Session Dosage Given: 1.8 Gy
Session Number: 20

## 2022-11-21 NOTE — Telephone Encounter (Signed)
RN attempted to call Mr. Dispenza to inform him of urine culture results and about antibiotics that was sent into his pharmacy for pick up and to start no answer and unable to leave voicemail due to it not being set up.  Will attempt another call if unable will notify him once e comes in for treatment  later this morning.

## 2022-11-21 NOTE — Telephone Encounter (Signed)
RN called Russell Trevino again he answered after verifying identity this writer informed patient of urine culture results per Freeman Caldron, PA-C and that an order for an antibiotic was sent to his pharmacy for him to pick up and to get started right away.  Russell Trevino was also encouraged to increase water intake as to avoid dehydration and to flush out impurities or clots in the bladder.  Russell Trevino was appreciative of the call and will pick up and start antibiotics before coming in for treatment today.  Nothing follows.

## 2022-11-22 ENCOUNTER — Ambulatory Visit
Admission: RE | Admit: 2022-11-22 | Discharge: 2022-11-22 | Disposition: A | Discharge status: Home / Self Care | Payer: Medicare Other | Source: Ambulatory Visit | Attending: Radiation Oncology | Admitting: Radiation Oncology

## 2022-11-22 ENCOUNTER — Other Ambulatory Visit: Payer: Self-pay

## 2022-11-22 ENCOUNTER — Encounter

## 2022-11-22 DIAGNOSIS — Z510 Encounter for antineoplastic radiation therapy: Secondary | ICD-10-CM | POA: Diagnosis not present

## 2022-11-22 DIAGNOSIS — R300 Dysuria: Secondary | ICD-10-CM | POA: Diagnosis not present

## 2022-11-22 DIAGNOSIS — C61 Malignant neoplasm of prostate: Secondary | ICD-10-CM | POA: Diagnosis not present

## 2022-11-22 DIAGNOSIS — Z191 Hormone sensitive malignancy status: Secondary | ICD-10-CM | POA: Diagnosis not present

## 2022-11-22 DIAGNOSIS — R350 Frequency of micturition: Secondary | ICD-10-CM | POA: Diagnosis not present

## 2022-11-22 LAB — RAD ONC ARIA SESSION SUMMARY
Course Elapsed Days: 28
Plan Fractions Treated to Date: 21
Plan Prescribed Dose Per Fraction: 1.8 Gy
Plan Total Fractions Prescribed: 25
Plan Total Prescribed Dose: 45 Gy
Reference Point Dosage Given to Date: 37.8 Gy
Reference Point Session Dosage Given: 1.8 Gy
Session Number: 21

## 2022-11-23 ENCOUNTER — Other Ambulatory Visit: Payer: Self-pay

## 2022-11-23 ENCOUNTER — Ambulatory Visit
Admission: RE | Admit: 2022-11-23 | Discharge: 2022-11-23 | Disposition: A | Discharge status: Home / Self Care | Payer: Medicare Other | Source: Ambulatory Visit | Attending: Radiation Oncology | Admitting: Radiation Oncology

## 2022-11-23 DIAGNOSIS — C61 Malignant neoplasm of prostate: Secondary | ICD-10-CM | POA: Diagnosis not present

## 2022-11-23 DIAGNOSIS — R350 Frequency of micturition: Secondary | ICD-10-CM | POA: Diagnosis not present

## 2022-11-23 DIAGNOSIS — R300 Dysuria: Secondary | ICD-10-CM | POA: Diagnosis not present

## 2022-11-23 DIAGNOSIS — Z191 Hormone sensitive malignancy status: Secondary | ICD-10-CM | POA: Diagnosis not present

## 2022-11-23 DIAGNOSIS — Z510 Encounter for antineoplastic radiation therapy: Secondary | ICD-10-CM | POA: Diagnosis not present

## 2022-11-23 LAB — RAD ONC ARIA SESSION SUMMARY
Course Elapsed Days: 29
Plan Fractions Treated to Date: 22
Plan Prescribed Dose Per Fraction: 1.8 Gy
Plan Total Fractions Prescribed: 25
Plan Total Prescribed Dose: 45 Gy
Reference Point Dosage Given to Date: 39.6 Gy
Reference Point Session Dosage Given: 1.8 Gy
Session Number: 22

## 2022-11-24 ENCOUNTER — Ambulatory Visit
Admission: RE | Admit: 2022-11-24 | Discharge: 2022-11-24 | Disposition: A | Discharge status: Home / Self Care | Payer: Medicare Other | Source: Ambulatory Visit | Attending: Radiation Oncology | Admitting: Radiation Oncology

## 2022-11-24 ENCOUNTER — Other Ambulatory Visit: Payer: Self-pay

## 2022-11-24 ENCOUNTER — Ambulatory Visit: Payer: Medicare Other

## 2022-11-24 ENCOUNTER — Encounter (HOSPITAL_BASED_OUTPATIENT_CLINIC_OR_DEPARTMENT_OTHER): Payer: Self-pay | Admitting: Cardiology

## 2022-11-24 ENCOUNTER — Ambulatory Visit (HOSPITAL_BASED_OUTPATIENT_CLINIC_OR_DEPARTMENT_OTHER): Payer: Medicare Other | Admitting: Cardiology

## 2022-11-24 VITALS — BP 110/62 | HR 70 | Ht 72.0 in | Wt 224.2 lb

## 2022-11-24 DIAGNOSIS — I255 Ischemic cardiomyopathy: Secondary | ICD-10-CM | POA: Diagnosis not present

## 2022-11-24 DIAGNOSIS — E1159 Type 2 diabetes mellitus with other circulatory complications: Secondary | ICD-10-CM | POA: Diagnosis not present

## 2022-11-24 DIAGNOSIS — I5042 Chronic combined systolic (congestive) and diastolic (congestive) heart failure: Secondary | ICD-10-CM | POA: Diagnosis not present

## 2022-11-24 DIAGNOSIS — Z510 Encounter for antineoplastic radiation therapy: Secondary | ICD-10-CM | POA: Diagnosis not present

## 2022-11-24 DIAGNOSIS — C61 Malignant neoplasm of prostate: Secondary | ICD-10-CM | POA: Diagnosis not present

## 2022-11-24 DIAGNOSIS — R300 Dysuria: Secondary | ICD-10-CM | POA: Diagnosis not present

## 2022-11-24 DIAGNOSIS — E782 Mixed hyperlipidemia: Secondary | ICD-10-CM | POA: Diagnosis not present

## 2022-11-24 DIAGNOSIS — Z191 Hormone sensitive malignancy status: Secondary | ICD-10-CM | POA: Diagnosis not present

## 2022-11-24 DIAGNOSIS — I25118 Atherosclerotic heart disease of native coronary artery with other forms of angina pectoris: Secondary | ICD-10-CM

## 2022-11-24 DIAGNOSIS — I10 Essential (primary) hypertension: Secondary | ICD-10-CM | POA: Diagnosis not present

## 2022-11-24 DIAGNOSIS — Z794 Long term (current) use of insulin: Secondary | ICD-10-CM | POA: Diagnosis not present

## 2022-11-24 DIAGNOSIS — Z951 Presence of aortocoronary bypass graft: Secondary | ICD-10-CM

## 2022-11-24 DIAGNOSIS — R350 Frequency of micturition: Secondary | ICD-10-CM | POA: Diagnosis not present

## 2022-11-24 LAB — RAD ONC ARIA SESSION SUMMARY
Course Elapsed Days: 30
Plan Fractions Treated to Date: 23
Plan Prescribed Dose Per Fraction: 1.8 Gy
Plan Total Fractions Prescribed: 25
Plan Total Prescribed Dose: 45 Gy
Reference Point Dosage Given to Date: 41.4 Gy
Reference Point Session Dosage Given: 1.8 Gy
Session Number: 23

## 2022-11-24 NOTE — Patient Instructions (Addendum)
Medication Instructions:  Your physician recommends that you continue on your current medications as directed. Please refer to the Current Medication list given to you today.  *If you need a refill on your cardiac medications before your next appointment, please call your pharmacy*  Lab Work: NONE  Testing/Procedures: NONE  Follow-Up: At [facility], you and your health needs are our priority.  As part of our continuing mission to provide you with exceptional heart care, we have created designated Provider Care Teams.  These Care Teams include your primary Cardiologist (physician) and Advanced Practice Providers (APPs -  Physician Assistants and Nurse Practitioners) who all work together to provide you with the care you need, when you need it.  We recommend signing up for the patient portal called "MyChart".  Sign up information is provided on this After Visit Summary.  MyChart is used to connect with patients for Virtual Visits (Telemedicine).  Patients are able to view lab/test results, encounter notes, upcoming appointments, etc.  Non-urgent messages can be sent to your provider as well.   To learn more about what you can do with MyChart, go to [email or web address].    Your next appointment:   03/02/2023 8:40 am with Dr Harrell Gave

## 2022-11-24 NOTE — Progress Notes (Signed)
Cardiology Office Note:    Date:  11/24/2022   ID:  Russell Trevino, DOB 1951-02-22, MRN [id number]  PCP:  Binnie Rail, MD  Cardiologist:  Buford Dresser, MD PhD  Referring MD: Binnie Rail, MD   CC: follow up   History of Present Illness:    Russell Trevino is a 72 y.o. male with a hx of CAD s/p prior CABG, NSTEMI 10/2018, ischemic cardiomyopathy with EF 40-45% who is seen for follow up.   Cardiac history: Hospitalized 10/2018 for NSTEMI and heart failure symptoms. Severe native CAD, no intervenable targets on cath. Recommended for medication management. Prior history notable for NSTEMI in 2006 that lead to CABG, with LIMA-LAD, L Radial-OM-D1, SVG-PDA. Noted to have ischemic cardiomyopathy with EF 40-45%. Echo 12/11/2021 LVEF 25% with septal and inferior wall motion abnormalities worse than previous.  He underwent LHC showing native left main occlusion with patent arterial graft.    At his previous visit he was feeling okay but was concerned with the fatigue he was experiencing in the morning. He denied snoring or stopping breathing when he sleeps. His swelling had been better and he had not had to take his lasix in awhile. He had kept up his routine of walking 3 times a week for 20-25 minutes with no symptoms. His BP was a little elevated in office at 130/70, though he stated that it was in the 120s at home regularly. Prior to the visit he had been switched to Ozempic (0.5 mg) but had not noticed any weight loss.   Today, he says he is not doing very well. He is going through radiation and has been feeling very lightheaded constantly. He is 4 weeks into his 8 week treatment course and Dr Tammi Klippel told him this usually when the side effects will become more prevalent. He has been drinking a lot of gatorade type drinks to try and maintain his hydration. He has not been drinking much plain water because he has a metallic taste in his mouth when he drinks it, regardless from where he gets  it.   He does not have any major concerns with his cardiovascular health. He has not been able to exercise the way he wants due to being so sick lately. He has had some weight loss but he is concerned about muscle loss. Occasionally he is having some shortness of breath but he attributes this to not being able to exercise. He also acknowledges a good bit of anxiety as he is dealing with his treatments.   At home his blood pressure has been good. His blood pressure runs 100-115/60-70.   He noticed some sensitivity to light while driving to one of his radiation treatments.   He denies any palpitations, chest pain, or peripheral edema. No headaches, syncope, orthopnea, or PND.   Past Medical History:  Diagnosis Date   BPH (benign prostatic hypertrophy)    Cancer (HCC)    prostate   Cardiomyopathy, ischemic    CHF (congestive heart failure) ([place])    Coronary artery disease    at [facility], [place] 2009 (GRAFTS PATENT), cath 05/2018 w/ SVG-PDA 100%>>med rx   Diabetes mellitus, type 2 (HCC)    Frequency of urination    GERD (gastroesophageal reflux disease)    Gross hematuria    History of CHF (congestive heart failure) 2007   History of kidney stones    History of myocardial infarction 2006, 2019   2006>>CABG, 2019 cath w/ med rx   History of  urinary retention    Hyperlipidemia    Hypertension    Myocardial infarction ([place])    2006   Nocturia    S/P CABG x 4 2006    Past Surgical History:  Procedure Laterality Date   CARDIAC CATHETERIZATION  01/17/2008   GRAFTS PATENT / EF 40%,  DR SANTOS ([facility])   CORONARY ARTERY BYPASS GRAFT  04/2005   LIMA-LAD, L radial-OM-D1 and SVG-PDA   CYSTO / RIGHT URETERAL STENT PLACEMENT  10-08-1999   CYSTO/  REMOVAL BLADDER STONES  01-14-2010   CYSTOSCOPY  10/16/2012   Procedure: CYSTOSCOPY;  Surgeon: Malka So, MD;  Location[facility];  Service: Urology;  Laterality: N/A;  Cystoscopy with Clot Evacuation and fulguration bladder  neck.   EXTRACORPOREAL SHOCK WAVE LITHOTRIPSY  10-15-2009   RIGHT   GOLD SEED IMPLANT N/A 10/06/2022   Procedure: GOLD SEED IMPLANT;  Surgeon: Raynelle Bring, MD;  Location: [facility];  Service: Urology;  Laterality: N/A;   KNEE ARTHROSCOPY  1978   RIGHT   LEFT HEART CATH AND CORS/GRAFTS ANGIOGRAPHY N/A 12/17/2021   Procedure: LEFT HEART CATH AND CORS/GRAFTS ANGIOGRAPHY;  Surgeon: [name], MD;  Location: [place] [facility];  Service: Cardiovascular;  Laterality: N/A;   LEFT SHOULDER SURGERY   2002   LUMBAR FUSION  09-10-2008   L4 - L5   PERCUTANEOUS NEPHROSTOLITHOTOMY  1979   PROSTATE BIOPSY  10/16/2012   Procedure: PROSTATE BIOPSY;  Surgeon: Malka So, MD;  Location: [facility];  Service: Urology;  Laterality: N/A;  Through ultrasound.   RIGHT/LEFT HEART CATH AND CORONARY/GRAFT ANGIOGRAPHY N/A 10/26/2018   Procedure: RIGHT/LEFT HEART CATH AND CORONARY/GRAFT ANGIOGRAPHY;  Surgeon: Martinique, Peter M, MD;  Location: [place] [facility];  Service: Cardiovascular;  Laterality: N/A;   SHOULDER ARTHROSCOPY W/ SUBACROMIAL DECOMPRESSION AND DISTAL CLAVICLE EXCISION  10-11-2004   AND PARTIAL ROTATOR CUFF REPAIR   SPACE OAR INSTILLATION N/A 10/06/2022   Procedure: SPACE OAR INSTILLATION;  Surgeon: Raynelle Bring, MD;  Location: [facility];  Service: Urology;  Laterality: N/A;  30 MINS FOR CASE   TRANSTHORACIC ECHOCARDIOGRAM  [id number]   LV SYSTOLIC FUNCTION MILDLY REDUCED/ EF 48%/ LV FILLING PATTERN  IS IMPAIRED/ HYPOKINESIS IN THE MID AND BASALAR SEPTUM & ANTEROSEPTUM   TURP VAPORIZATION  01-19-2010   BPH W/ BOO   URETEROLITHOTOMY  1976    Current Medications: Current Outpatient Medications on File Prior to Visit  Medication Sig   amLODipine (NORVASC) 5 MG tablet Take 1 tablet by mouth daily.   ezetimibe (ZETIA) 10 MG tablet Take 10 mg by mouth daily.   famotidine (PEPCID) 40 MG tablet Take 40 mg by mouth at bedtime.   finasteride (PROSCAR) 5 MG tablet TAKE 1 TABLET(5 MG) BY  MOUTH DAILY (Patient taking differently: Take 5 mg by mouth at bedtime.)   insulin glargine (LANTUS SOLOSTAR) 100 UNIT/ML Solostar Pen Inject 30 Units into the skin 2 (two) times daily.   Insulin Pen Needle 31G X 6 MM MISC UAD for insulin pen   Insulin Syringe-Needle U-100 (BD VEO INSULIN SYRINGE U/F) 31G X 15/64" 1 ML MISC use as directed TO INJECT INSULIN  twice a day   isosorbide mononitrate (IMDUR) 120 MG 24 hr tablet Take 1 tablet (120 mg total) by mouth 2 (two) times daily.   metoprolol succinate (TOPROL-XL) 50 MG 24 hr tablet Take 1 tablet (50 mg total) by mouth daily at 12 noon. Take with or immediately following a meal.  nitroGLYCERIN (NITROSTAT) 0.4 MG SL tablet PLACE 1 TABLET UNDER THE TONGUE EVERY 5 MINS FOR CHEST FOR PAIN   ondansetron (ZOFRAN-ODT) 8 MG disintegrating tablet Take 1 tablet (8 mg total) by mouth every 8 (eight) hours as needed for nausea or vomiting.   pantoprazole (PROTONIX) 40 MG tablet Take 40 mg by mouth daily.   ranolazine (RANEXA) 500 MG 12 hr tablet Take 500 mg by mouth 2 (two) times daily.   rosuvastatin (CRESTOR) 40 MG tablet Take 1 tablet (40 mg total) by mouth daily.   sacubitril-valsartan (ENTRESTO) 49-51 MG Take 1 tablet by mouth 2 (two) times daily.   Semaglutide, 2 MG/DOSE, 8 MG/3ML SOPN Inject 2 mg into the skin once a week.   sulfamethoxazole-trimethoprim (BACTRIM DS) 800-160 MG tablet Take 1 tablet by mouth 2 (two) times daily.   tamsulosin (FLOMAX) 0.4 MG CAPS capsule Take 1 capsule (0.4 mg total) by mouth daily after supper.   No current facility-administered medications on file prior to visit.     Allergies:   Patient has no allergy information on record.   Social History   Tobacco Use   Smoking status: Never   Smokeless tobacco: Never  Vaping Use   Vaping Use: Never used  Substance Use Topics   Alcohol use: Not Currently    Alcohol/week: 2.0 standard drinks of alcohol    Types: 2 Cans of beer per week   Drug use: No    Family  History: The patient's family history includes Diabetes in his father; Lung cancer (age of onset: 22) in his mother. There is no history of Stomach cancer, Colon cancer, Rectal cancer, Esophageal cancer, or Pancreatic cancer.  ROS:   Please see the history of present illness.   (+) lightheadedness (+) shortness of breath (+) sensitivity to light Additional pertinent ROS negative except as noted in HPI.    EKGs/Labs/Other Studies Reviewed:    The following studies were reviewed today:  Left Heart Cath  12/17/2021:   Mid LM to Dist LM lesion is 100% stenosed.   Prox LAD lesion is 100% stenosed.   Prox Cx lesion is 100% stenosed.   Prox RCA to Dist RCA lesion is 100% stenosed.   Dist LAD lesion is 60% stenosed.   LV end diastolic pressure is normal.   1.  Patent LIMA to LAD with radial off the LIMA graft to diagonal and obtuse marginal branches. 2.  Vein graft right coronary artery known occluded and not imaged. 3.  Severe native vessel disease with known occlusion of right coronary artery which was not imaged on the study and newly occluded left main lesion. 4.  LVEDP of 16 mmHg.   The results were reviewed with Dr. Gasper Sells and optimal medical therapy will be pursued.  Diagnostic Dominance: Right    Echo  12/17/2021: Sonographer Comments: Image acquisition challenging due to patient body habitus.  IMPRESSIONS    1. Left ventricular ejection fraction, by estimation, is 20 to 25%. The  left ventricle has severely decreased function. The left ventricle  demonstrates regional wall motion abnormalities (see scoring  diagram/findings for description). The left  ventricular internal cavity size was moderately dilated. Left ventricular  diastolic parameters are indeterminate.   2. Right ventricular systolic function is moderately reduced. The right  ventricular size is normal.   3. Left atrial size was mildly dilated.   4. The mitral valve is grossly normal. Trivial mitral  valve  regurgitation. No evidence of mitral stenosis.   5. The aortic  valve was not well visualized. Aortic valve regurgitation  is not visualized. No aortic stenosis is present.   6. Aortic dilatation noted. There is mild dilatation of the ascending  aorta, measuring 41 mm.   Comparison(s): Changes from prior study are noted.   Conclusion(s)/Recommendation(s): Wall motion better seen on current study. Diffuse hypokinesis with akinesis predominantly in the inferoseptal and inferior walls. No evidence of LV thrombus. Severely reduced LVEF, 20-25%.   Echo 10/26/18 1. The left ventricle has mild-moderately reduced systolic function, with an ejection fraction of 40-45%. The cavity size was severely dilated. Left ventricular diastolic Doppler parameters are consistent with pseudonormalization Elevated left ventricular end-diastolic pressure.  2. Poor images preclude accurate assessment of wall motion even with definity.  3. The right ventricle has normal systolic function. The cavity was normal. There is no increase in right ventricular wall thickness.  4. Left atrial size was moderately dilated.  5. The mitral valve is normal in structure. Mitral valve regurgitation is moderate by color flow Doppler.  6. The tricuspid valve is normal in structure.  7. The aortic valve is tricuspid.  8. The pulmonic valve was normal in structure.  9. There is mild dilatation of the ascending aorta measuring 38 mm. 10. Right atrial pressure is estimated at 3 mmHg.  R/LHC 10/26/18 Mid LM to Dist LM lesion is 95% stenosed. Prox LAD lesion is 100% stenosed. Prox Cx lesion is 100% stenosed. Prox RCA to Dist RCA lesion is 100% stenosed. LV end diastolic pressure is normal. Hemodynamic findings consistent with mild pulmonary hypertension.   1. Occlusive native vessel CAD.     -95% distal left main    - 100% proximal LAD after the first septal perforator    - 100% LCx after a very small OM1.    - 100% proximal  RCA 2. Patent LIMA to the LAD 3. Patent free radial graft arising from the mid LIMA graft and supplying the first diagonal and OM 4. SVG to RCA is known to be occluded. The distal RCA is supplied by collaterals.  5. Normal LV filling pressures 6. Mild pulmonary HTN. 7. Preserved cardiac output.   Plan: continue medical therapy for CHF. Will switch IV lasix to po. Stop IV Ntg and heparin.   EKG:  EKG is personally reviewed.  11/24/2022: nsr at 70 bpm, iLBBB, nonspecific st changes  05/19/2022:  EKG was not ordered. 01/28/2021: NSR, iLBBB, nonspecific ST changes 08/30/19: sinus bradycardia at 56 bpm, iLBBB. Inferior/lateral ST changes similar location but more pronounced than prior  Recent Labs: 04/25/2022: BNP 7.3 08/03/2022: ALT 27; Hemoglobin 14.2; Platelets 162.0; TSH 2.39 10/04/2022: BUN 25; Creatinine, Ser 1.66; Potassium 4.8; Sodium 132   Recent Lipid Panel    Component Value Date/Time   CHOL 132 08/03/2022 1101   TRIG 185.0 (H) 08/03/2022 1101   HDL 40.90 08/03/2022 1101   CHOLHDL 3 08/03/2022 1101   VLDL 37.0 08/03/2022 1101   LDLCALC 54 08/03/2022 1101   LDLDIRECT 66.0 03/24/2021 0859    Physical Exam:    VS:  BP 110/62 (BP Location: Left Arm, Patient Position: Sitting, Cuff Size: Normal)   Pulse 70   Ht 6' (1.829 m)   Wt 224 lb 3.2 oz (101.7 kg)   BMI 30.41 kg/m     Wt Readings from Last 3 Encounters:  11/24/22 224 lb 3.2 oz (101.7 kg)  10/06/22 223 lb 15.8 oz (101.6 kg)  10/04/22 224 lb (101.6 kg)   GEN: Well nourished, well developed in no  acute distress HEENT: Normal, moist mucous membranes NECK: No JVD CARDIAC: regular rhythm, normal S1 and S2, no rubs or gallops. No murmur. VASCULAR: Radial and DP pulses 2+ bilaterally. No carotid bruits RESPIRATORY:  Clear to auscultation without rales, wheezing or rhonchi  ABDOMEN: Soft, non-tender, non-distended MUSCULOSKELETAL:  Ambulates independently SKIN: Warm and dry,  1+ LLE pitting edema (chronic), no  significant RLE edema NEUROLOGIC:  Alert and oriented x 3. No focal neuro deficits noted. PSYCHIATRIC:  Normal affect   ASSESSMENT:    1. Coronary artery disease of native artery of native heart with stable angina pectoris ([place])   2. Hx of CABG   3. Type 2 diabetes mellitus with other circulatory complication, with long-term current use of insulin ([place])   4. Essential hypertension   5. Chronic combined systolic and diastolic heart failure ([place])   6. Ischemic cardiomyopathy   7. Mixed hyperlipidemia    PLAN:    Fatigue: in the setting of radiation treatment for prostate cancer  CAD s/p CABG 2006: known SVG-PDA is occluded, other grafts are patent. No intervenable targets. Severe native disease -most recent cath 12/2021 as above -see prior discussion, continuing DAPT indefinitely -on metoprolol succinate 50 mg daily -on imdur 120 mg BID, feels better than taking daily -on ranexa 500 mg BID -on amlodipine as antianginal -has sublingual nitroglycerin -counseled on life threatening hypotension with isosorbide, nitroglycerin and sildenafil/erectile dysfunction meds in this class. Would not use PDE5i given this. -no recent chest pain.  Chronic systolic and diastolic heart failure Ischemic cardiomyopathy -NYHA class II symptoms today, stable -most recent EF 20-25% 12/2021, was off of medications. -continue metoprolol succinate 50 mg daily -tolerating entresto 49-51 mg BID -continue lasix 40 mg as needed. Has CKD stage 3b, previously referred to nephrology, does not appear that this occurred. -educated on heart failure, including daily weights, sodium and fluid guidelines, signs/symptoms to watch for, diet and activity recommendations -discuss ICD at follow up  Hypertension: at goal of <130/80, continue medications as listed  Hyperlipidemia, mixed: -LDL goal <70. Last LDL 09/23/22 92. On rosuvastatin 40 mg, on ezetimibe as well.  -would recommend PCSK9i as he is not at goal. Have  discussed vascepa as well. Major issue is cost. -continue to discuss pcks9i at follow up  Type II diabetes, on insulin and metformin, with chronic kidney disease stage 3.  -most recent A1c 12 on 09/23/22 -on Ozempic 2 mg weekly, insulin glargine BID -would add SGLT2i once A2c improved (may have significant glucose loss and dehydration with severely elevated A1c). Was on empagliflozin previously. If cost an issue, consider generic dapagliflozin  Daytime somnolence: -sleep study ordered but does not appear this was completed  Secondary prevention lifestyle recommendations: -recommend heart healthy/Mediterranean diet, with whole grains, fruits, vegetable, fish, lean meats, nuts, and olive oil. Limit salt. -recommend moderate walking, 3-5 times/week for 30-50 minutes each session. Aim for at least 150 minutes.week. Goal should be pace of 3 miles/hours, or walking 1.5 miles in 30 minutes -recommend avoidance of tobacco products. Avoid excess alcohol.  Follow up: 3 months  Medication Adjustments/Labs and Tests Ordered: Current medicines are reviewed at length with the patient today.  Concerns regarding medicines are outlined above.   Orders Placed This Encounter  Procedures   EKG 12-Lead   No orders of the defined types were placed in this encounter.  Patient Instructions  Medication Instructions:  Your physician recommends that you continue on your current medications as directed. Please refer to the Current Medication list given  to you today.  *If you need a refill on your cardiac medications before your next appointment, please call your pharmacy*  Lab Work: NONE  Testing/Procedures: NONE  Follow-Up: At [facility], you and your health needs are our priority.  As part of our continuing mission to provide you with exceptional heart care, we have created designated Provider Care Teams.  These Care Teams include your primary Cardiologist (physician) and Advanced Practice  Providers (APPs -  Physician Assistants and Nurse Practitioners) who all work together to provide you with the care you need, when you need it.  We recommend signing up for the patient portal called "MyChart".  Sign up information is provided on this After Visit Summary.  MyChart is used to connect with patients for Virtual Visits (Telemedicine).  Patients are able to view lab/test results, encounter notes, upcoming appointments, etc.  Non-urgent messages can be sent to your provider as well.   To learn more about what you can do with MyChart, go to [email or web address].    Your next appointment:   03/02/2023 8:40 am with Dr Harrell Gave        Burnett Kanaris Ford,acting as a scribe for Buford Dresser, MD.,have documented all relevant documentation on the behalf of Buford Dresser, MD,as directed by  Buford Dresser, MD while in the presence of Buford Dresser, MD.  I, Buford Dresser, MD, have reviewed all documentation for this visit. The documentation on 12/05/22 for the exam, diagnosis, procedures, and orders are all accurate and complete.   Signed, Buford Dresser, MD PhD 11/24/2022   [place]

## 2022-11-25 ENCOUNTER — Ambulatory Visit
Admission: RE | Admit: 2022-11-25 | Discharge: 2022-11-25 | Disposition: A | Discharge status: Home / Self Care | Payer: Medicare Other | Source: Ambulatory Visit | Attending: Radiation Oncology | Admitting: Radiation Oncology

## 2022-11-25 ENCOUNTER — Other Ambulatory Visit: Payer: Self-pay

## 2022-11-25 DIAGNOSIS — R350 Frequency of micturition: Secondary | ICD-10-CM | POA: Diagnosis not present

## 2022-11-25 DIAGNOSIS — C61 Malignant neoplasm of prostate: Secondary | ICD-10-CM

## 2022-11-25 DIAGNOSIS — R300 Dysuria: Secondary | ICD-10-CM | POA: Diagnosis not present

## 2022-11-25 DIAGNOSIS — Z191 Hormone sensitive malignancy status: Secondary | ICD-10-CM | POA: Diagnosis not present

## 2022-11-25 DIAGNOSIS — Z510 Encounter for antineoplastic radiation therapy: Secondary | ICD-10-CM | POA: Diagnosis not present

## 2022-11-25 LAB — RAD ONC ARIA SESSION SUMMARY
Course Elapsed Days: 31
Plan Fractions Treated to Date: 24
Plan Prescribed Dose Per Fraction: 1.8 Gy
Plan Total Fractions Prescribed: 25
Plan Total Prescribed Dose: 45 Gy
Reference Point Dosage Given to Date: 43.2 Gy
Reference Point Session Dosage Given: 1.8 Gy
Session Number: 24

## 2022-11-27 NOTE — Progress Notes (Signed)
  Radiation Oncology         [phone]) [phone] ________________________________  Name: Russell Trevino MRN: [id number]  Date: 11/25/2022  DOB: 1950/10/13  COMPLEX SIMULATION NOTE  NARRATIVE:  The patient has a smaller rectal volume on treatment imaging requiring re-planning to account for the change.  He underwent simulation today for ongoing radiation therapy.  An updated CT study set was obtained for the purpose of treatment planning.  The target and avoidance structures were contoured.  Treatment planning then occurred.  The radiation boost prescription was entered and confirmed.   I have requested : Intensity Modulated Radiotherapy (IMRT) is medically necessary for this case for the following reason:  Rectal sparing.Marland Kitchen   PLAN:  This modified radiation beam arrangement is intended to continue the current radiation dose to an additional 30 Gy in 15 fractions for a total cumulative dose of 2 Gy.  ------------------------------------------------  Sheral Apley. Tammi Klippel, M.D.

## 2022-11-28 ENCOUNTER — Other Ambulatory Visit: Payer: Self-pay

## 2022-11-28 ENCOUNTER — Ambulatory Visit
Admission: RE | Admit: 2022-11-28 | Discharge: 2022-11-28 | Disposition: A | Discharge status: Home / Self Care | Payer: Medicare Other | Source: Ambulatory Visit | Attending: Radiation Oncology | Admitting: Radiation Oncology

## 2022-11-28 DIAGNOSIS — Z191 Hormone sensitive malignancy status: Secondary | ICD-10-CM | POA: Diagnosis not present

## 2022-11-28 DIAGNOSIS — R300 Dysuria: Secondary | ICD-10-CM | POA: Diagnosis not present

## 2022-11-28 DIAGNOSIS — Z510 Encounter for antineoplastic radiation therapy: Secondary | ICD-10-CM | POA: Diagnosis not present

## 2022-11-28 DIAGNOSIS — C61 Malignant neoplasm of prostate: Secondary | ICD-10-CM | POA: Diagnosis not present

## 2022-11-28 DIAGNOSIS — R350 Frequency of micturition: Secondary | ICD-10-CM | POA: Diagnosis not present

## 2022-11-28 LAB — RAD ONC ARIA SESSION SUMMARY
Course Elapsed Days: 34
Plan Fractions Treated to Date: 25
Plan Prescribed Dose Per Fraction: 1.8 Gy
Plan Total Fractions Prescribed: 25
Plan Total Prescribed Dose: 45 Gy
Reference Point Dosage Given to Date: 45 Gy
Reference Point Session Dosage Given: 1.8 Gy
Session Number: 25

## 2022-11-29 ENCOUNTER — Other Ambulatory Visit: Payer: Self-pay

## 2022-11-29 ENCOUNTER — Ambulatory Visit: Payer: Medicare Other

## 2022-11-29 ENCOUNTER — Ambulatory Visit
Admission: RE | Admit: 2022-11-29 | Discharge: 2022-11-29 | Disposition: A | Discharge status: Home / Self Care | Payer: Medicare Other | Source: Ambulatory Visit | Attending: Radiation Oncology | Admitting: Radiation Oncology

## 2022-11-29 DIAGNOSIS — Z191 Hormone sensitive malignancy status: Secondary | ICD-10-CM | POA: Diagnosis not present

## 2022-11-29 DIAGNOSIS — C61 Malignant neoplasm of prostate: Secondary | ICD-10-CM | POA: Diagnosis not present

## 2022-11-29 DIAGNOSIS — R300 Dysuria: Secondary | ICD-10-CM | POA: Diagnosis not present

## 2022-11-29 DIAGNOSIS — R350 Frequency of micturition: Secondary | ICD-10-CM | POA: Diagnosis not present

## 2022-11-29 DIAGNOSIS — Z510 Encounter for antineoplastic radiation therapy: Secondary | ICD-10-CM | POA: Diagnosis not present

## 2022-11-29 LAB — RAD ONC ARIA SESSION SUMMARY
Course Elapsed Days: 35
Plan Fractions Treated to Date: 1
Plan Prescribed Dose Per Fraction: 2 Gy
Plan Total Fractions Prescribed: 15
Plan Total Prescribed Dose: 30 Gy
Reference Point Dosage Given to Date: 2 Gy
Reference Point Session Dosage Given: 2 Gy
Session Number: 26

## 2022-11-30 ENCOUNTER — Ambulatory Visit
Admission: RE | Admit: 2022-11-30 | Discharge: 2022-11-30 | Disposition: A | Discharge status: Home / Self Care | Payer: Medicare Other | Source: Ambulatory Visit | Attending: Radiation Oncology | Admitting: Radiation Oncology

## 2022-11-30 ENCOUNTER — Ambulatory Visit: Payer: Medicare Other

## 2022-11-30 ENCOUNTER — Other Ambulatory Visit: Payer: Self-pay

## 2022-11-30 ENCOUNTER — Ambulatory Visit: Payer: MEDICARE | Primary: Emergency Medicine

## 2022-11-30 DIAGNOSIS — Z510 Encounter for antineoplastic radiation therapy: Secondary | ICD-10-CM | POA: Diagnosis not present

## 2022-11-30 DIAGNOSIS — R300 Dysuria: Secondary | ICD-10-CM | POA: Diagnosis not present

## 2022-11-30 DIAGNOSIS — R350 Frequency of micturition: Secondary | ICD-10-CM | POA: Diagnosis not present

## 2022-11-30 DIAGNOSIS — C61 Malignant neoplasm of prostate: Secondary | ICD-10-CM | POA: Diagnosis not present

## 2022-11-30 DIAGNOSIS — Z191 Hormone sensitive malignancy status: Secondary | ICD-10-CM | POA: Diagnosis not present

## 2022-11-30 LAB — RAD ONC ARIA SESSION SUMMARY
Course Elapsed Days: 36
Plan Fractions Treated to Date: 2
Plan Prescribed Dose Per Fraction: 2 Gy
Plan Total Fractions Prescribed: 15
Plan Total Prescribed Dose: 30 Gy
Reference Point Dosage Given to Date: 4 Gy
Reference Point Session Dosage Given: 2 Gy
Session Number: 27

## 2022-12-01 ENCOUNTER — Other Ambulatory Visit: Payer: Self-pay

## 2022-12-01 ENCOUNTER — Ambulatory Visit
Admission: RE | Admit: 2022-12-01 | Discharge: 2022-12-01 | Disposition: A | Discharge status: Home / Self Care | Payer: Medicare Other | Source: Ambulatory Visit | Attending: Radiation Oncology | Admitting: Radiation Oncology

## 2022-12-01 ENCOUNTER — Ambulatory Visit: Payer: Medicare Other

## 2022-12-01 DIAGNOSIS — R350 Frequency of micturition: Secondary | ICD-10-CM | POA: Diagnosis not present

## 2022-12-01 DIAGNOSIS — C61 Malignant neoplasm of prostate: Secondary | ICD-10-CM | POA: Diagnosis not present

## 2022-12-01 DIAGNOSIS — R300 Dysuria: Secondary | ICD-10-CM | POA: Diagnosis not present

## 2022-12-01 DIAGNOSIS — Z510 Encounter for antineoplastic radiation therapy: Secondary | ICD-10-CM | POA: Diagnosis not present

## 2022-12-01 DIAGNOSIS — Z191 Hormone sensitive malignancy status: Secondary | ICD-10-CM | POA: Diagnosis not present

## 2022-12-01 LAB — RAD ONC ARIA SESSION SUMMARY
Course Elapsed Days: 37
Plan Fractions Treated to Date: 3
Plan Prescribed Dose Per Fraction: 2 Gy
Plan Total Fractions Prescribed: 15
Plan Total Prescribed Dose: 30 Gy
Reference Point Dosage Given to Date: 6 Gy
Reference Point Session Dosage Given: 2 Gy
Session Number: 28

## 2022-12-02 ENCOUNTER — Other Ambulatory Visit: Payer: Self-pay

## 2022-12-02 ENCOUNTER — Other Ambulatory Visit: Payer: Self-pay | Admitting: Radiation Oncology

## 2022-12-02 ENCOUNTER — Ambulatory Visit: Payer: Medicare Other

## 2022-12-02 ENCOUNTER — Ambulatory Visit
Admission: RE | Admit: 2022-12-02 | Discharge: 2022-12-02 | Disposition: A | Discharge status: Home / Self Care | Payer: Medicare Other | Source: Ambulatory Visit | Attending: Radiation Oncology | Admitting: Radiation Oncology

## 2022-12-02 DIAGNOSIS — C61 Malignant neoplasm of prostate: Secondary | ICD-10-CM

## 2022-12-02 DIAGNOSIS — Z191 Hormone sensitive malignancy status: Secondary | ICD-10-CM | POA: Diagnosis not present

## 2022-12-02 DIAGNOSIS — R300 Dysuria: Secondary | ICD-10-CM | POA: Diagnosis not present

## 2022-12-02 DIAGNOSIS — Z510 Encounter for antineoplastic radiation therapy: Secondary | ICD-10-CM | POA: Diagnosis not present

## 2022-12-02 DIAGNOSIS — R350 Frequency of micturition: Secondary | ICD-10-CM | POA: Diagnosis not present

## 2022-12-02 LAB — URINALYSIS, COMPLETE (UACMP) WITH MICROSCOPIC
Bilirubin Urine: NEGATIVE
Glucose, UA: NEGATIVE mg/dL
Ketones, ur: NEGATIVE mg/dL
Leukocytes,Ua: NEGATIVE
Nitrite: NEGATIVE
Protein, ur: >300 mg/dL — AB
Specific Gravity, Urine: 1.025 (ref 1.005–1.030)
pH: 6 (ref 5.0–8.0)

## 2022-12-02 LAB — RAD ONC ARIA SESSION SUMMARY
Course Elapsed Days: 38
Plan Fractions Treated to Date: 4
Plan Prescribed Dose Per Fraction: 2 Gy
Plan Total Fractions Prescribed: 15
Plan Total Prescribed Dose: 30 Gy
Reference Point Dosage Given to Date: 8 Gy
Reference Point Session Dosage Given: 2 Gy
Session Number: 29

## 2022-12-02 MED ORDER — SULFAMETHOXAZOLE-TRIMETHOPRIM 800-160 MG PO TABS: 1.0000 | ORAL_TABLET | Freq: Two times a day (BID) | ORAL | 0 refills | Status: DC

## 2022-12-04 ENCOUNTER — Encounter (HOSPITAL_BASED_OUTPATIENT_CLINIC_OR_DEPARTMENT_OTHER): Payer: Self-pay | Admitting: Cardiology

## 2022-12-04 LAB — URINE CULTURE: Culture: 20000 — AB

## 2022-12-05 ENCOUNTER — Ambulatory Visit: Payer: Medicare Other

## 2022-12-05 ENCOUNTER — Other Ambulatory Visit: Payer: Self-pay | Admitting: Radiation Oncology

## 2022-12-05 MED ORDER — NITROFURANTOIN MONOHYD MACRO 100 MG PO CAPS: 100.0000 mg | ORAL_CAPSULE | Freq: Two times a day (BID) | ORAL | 0 refills | Status: AC

## 2022-12-05 NOTE — Progress Notes (Signed)
Called pt w/ result of urine culture   Switched to Macrobid x 14 days

## 2022-12-06 ENCOUNTER — Ambulatory Visit
Admission: RE | Admit: 2022-12-06 | Discharge: 2022-12-06 | Disposition: A | Discharge status: Home / Self Care | Payer: Medicare Other | Source: Ambulatory Visit | Attending: Radiation Oncology | Admitting: Radiation Oncology

## 2022-12-06 ENCOUNTER — Other Ambulatory Visit: Payer: Self-pay

## 2022-12-06 DIAGNOSIS — C61 Malignant neoplasm of prostate: Secondary | ICD-10-CM | POA: Diagnosis not present

## 2022-12-06 DIAGNOSIS — R300 Dysuria: Secondary | ICD-10-CM | POA: Diagnosis not present

## 2022-12-06 DIAGNOSIS — Z191 Hormone sensitive malignancy status: Secondary | ICD-10-CM | POA: Diagnosis not present

## 2022-12-06 DIAGNOSIS — R350 Frequency of micturition: Secondary | ICD-10-CM | POA: Diagnosis not present

## 2022-12-06 DIAGNOSIS — Z510 Encounter for antineoplastic radiation therapy: Secondary | ICD-10-CM | POA: Diagnosis not present

## 2022-12-06 LAB — RAD ONC ARIA SESSION SUMMARY
Course Elapsed Days: 42
Plan Fractions Treated to Date: 5
Plan Prescribed Dose Per Fraction: 2 Gy
Plan Total Fractions Prescribed: 15
Plan Total Prescribed Dose: 30 Gy
Reference Point Dosage Given to Date: 10 Gy
Reference Point Session Dosage Given: 2 Gy
Session Number: 30

## 2022-12-06 NOTE — Progress Notes (Signed)
Russell Trevino was seen after treatment today for temperature check.  Russell Trevino had complaints of left flank pain and gave urine specimen on 12/02/2022 resulted in UTI.  Prescribed Bactrim DS 800-160 mg as preventive while waiting for results of urine culture.  Russell Trevino urine culture results in and was told to stop taking Bactrim DS and to start Macrobid 12/05/2022 was prescribed Macrobid 100 mg po twice a day for 14 days and to come over for temperature checks per Dr. Tammi Klippel.  RN spoke with patient about any symptoms he denied fever, chills, and nausea/vomiting.  Reports feels a little better today since starting on the Macrobid.  Russell Trevino was encouraged to increase water intake and to go to emergency department if having fever or chills. Vitals= 97.7-82-20-100/62.

## 2022-12-07 ENCOUNTER — Ambulatory Visit
Admission: RE | Admit: 2022-12-07 | Discharge: 2022-12-07 | Disposition: A | Discharge status: Home / Self Care | Payer: Medicare Other | Source: Ambulatory Visit | Attending: Radiation Oncology | Admitting: Radiation Oncology

## 2022-12-07 ENCOUNTER — Other Ambulatory Visit: Payer: Self-pay

## 2022-12-07 DIAGNOSIS — Z191 Hormone sensitive malignancy status: Secondary | ICD-10-CM | POA: Diagnosis not present

## 2022-12-07 DIAGNOSIS — C61 Malignant neoplasm of prostate: Secondary | ICD-10-CM | POA: Diagnosis not present

## 2022-12-07 DIAGNOSIS — R300 Dysuria: Secondary | ICD-10-CM | POA: Diagnosis not present

## 2022-12-07 DIAGNOSIS — R350 Frequency of micturition: Secondary | ICD-10-CM | POA: Diagnosis not present

## 2022-12-07 DIAGNOSIS — Z510 Encounter for antineoplastic radiation therapy: Secondary | ICD-10-CM | POA: Diagnosis not present

## 2022-12-07 LAB — RAD ONC ARIA SESSION SUMMARY
Course Elapsed Days: 43
Plan Fractions Treated to Date: 6
Plan Prescribed Dose Per Fraction: 2 Gy
Plan Total Fractions Prescribed: 15
Plan Total Prescribed Dose: 30 Gy
Reference Point Dosage Given to Date: 12 Gy
Reference Point Session Dosage Given: 2 Gy
Session Number: 31

## 2022-12-08 ENCOUNTER — Other Ambulatory Visit: Payer: Self-pay

## 2022-12-08 ENCOUNTER — Ambulatory Visit
Admission: RE | Admit: 2022-12-08 | Discharge: 2022-12-08 | Disposition: A | Discharge status: Home / Self Care | Payer: Medicare Other | Source: Ambulatory Visit | Attending: Radiation Oncology | Admitting: Radiation Oncology

## 2022-12-08 ENCOUNTER — Ambulatory Visit: Payer: Medicare Other

## 2022-12-08 DIAGNOSIS — Z191 Hormone sensitive malignancy status: Secondary | ICD-10-CM | POA: Diagnosis not present

## 2022-12-08 DIAGNOSIS — C61 Malignant neoplasm of prostate: Secondary | ICD-10-CM | POA: Diagnosis not present

## 2022-12-08 DIAGNOSIS — R350 Frequency of micturition: Secondary | ICD-10-CM | POA: Diagnosis not present

## 2022-12-08 DIAGNOSIS — Z510 Encounter for antineoplastic radiation therapy: Secondary | ICD-10-CM | POA: Diagnosis not present

## 2022-12-08 DIAGNOSIS — R300 Dysuria: Secondary | ICD-10-CM | POA: Diagnosis not present

## 2022-12-08 LAB — RAD ONC ARIA SESSION SUMMARY
Course Elapsed Days: 44
Plan Fractions Treated to Date: 7
Plan Prescribed Dose Per Fraction: 2 Gy
Plan Total Fractions Prescribed: 15
Plan Total Prescribed Dose: 30 Gy
Reference Point Dosage Given to Date: 14 Gy
Reference Point Session Dosage Given: 2 Gy
Session Number: 32

## 2022-12-09 ENCOUNTER — Ambulatory Visit: Payer: Medicare Other

## 2022-12-12 ENCOUNTER — Ambulatory Visit: Payer: Medicare Other

## 2022-12-13 ENCOUNTER — Other Ambulatory Visit: Payer: Self-pay

## 2022-12-13 ENCOUNTER — Ambulatory Visit
Admission: RE | Admit: 2022-12-13 | Discharge: 2022-12-13 | Disposition: A | Discharge status: Home / Self Care | Payer: Medicare Other | Source: Ambulatory Visit | Attending: Radiation Oncology | Admitting: Radiation Oncology

## 2022-12-13 DIAGNOSIS — R350 Frequency of micturition: Secondary | ICD-10-CM | POA: Diagnosis not present

## 2022-12-13 DIAGNOSIS — N200 Calculus of kidney: Secondary | ICD-10-CM | POA: Diagnosis not present

## 2022-12-13 DIAGNOSIS — Z191 Hormone sensitive malignancy status: Secondary | ICD-10-CM | POA: Diagnosis not present

## 2022-12-13 DIAGNOSIS — Z87442 Personal history of urinary calculi: Secondary | ICD-10-CM | POA: Diagnosis not present

## 2022-12-13 DIAGNOSIS — C61 Malignant neoplasm of prostate: Secondary | ICD-10-CM | POA: Insufficient documentation

## 2022-12-13 DIAGNOSIS — R300 Dysuria: Secondary | ICD-10-CM | POA: Insufficient documentation

## 2022-12-13 DIAGNOSIS — Z510 Encounter for antineoplastic radiation therapy: Secondary | ICD-10-CM | POA: Diagnosis not present

## 2022-12-13 LAB — RAD ONC ARIA SESSION SUMMARY
Course Elapsed Days: 49
Plan Fractions Treated to Date: 8
Plan Prescribed Dose Per Fraction: 2 Gy
Plan Total Fractions Prescribed: 15
Plan Total Prescribed Dose: 30 Gy
Reference Point Dosage Given to Date: 16 Gy
Reference Point Session Dosage Given: 2 Gy
Session Number: 33

## 2022-12-14 ENCOUNTER — Ambulatory Visit: Payer: Medicare Other

## 2022-12-14 ENCOUNTER — Telehealth: Payer: Self-pay | Admitting: Radiation Oncology

## 2022-12-14 NOTE — Telephone Encounter (Signed)
Pt called to cx today's tx appt due to vomiting. L3 notified and appt cx. Pt was advised to contact us with any issues/concerns if needed.

## 2022-12-15 ENCOUNTER — Ambulatory Visit
Admission: RE | Admit: 2022-12-15 | Discharge: 2022-12-15 | Disposition: A | Discharge status: Home / Self Care | Payer: Medicare Other | Source: Ambulatory Visit | Attending: Radiation Oncology | Admitting: Radiation Oncology

## 2022-12-15 ENCOUNTER — Other Ambulatory Visit: Payer: Self-pay

## 2022-12-15 DIAGNOSIS — Z191 Hormone sensitive malignancy status: Secondary | ICD-10-CM | POA: Diagnosis not present

## 2022-12-15 DIAGNOSIS — R300 Dysuria: Secondary | ICD-10-CM | POA: Diagnosis not present

## 2022-12-15 DIAGNOSIS — R350 Frequency of micturition: Secondary | ICD-10-CM | POA: Diagnosis not present

## 2022-12-15 DIAGNOSIS — C61 Malignant neoplasm of prostate: Secondary | ICD-10-CM | POA: Diagnosis not present

## 2022-12-15 DIAGNOSIS — Z510 Encounter for antineoplastic radiation therapy: Secondary | ICD-10-CM | POA: Diagnosis not present

## 2022-12-15 LAB — RAD ONC ARIA SESSION SUMMARY
Course Elapsed Days: 51
Plan Fractions Treated to Date: 9
Plan Prescribed Dose Per Fraction: 2 Gy
Plan Total Fractions Prescribed: 15
Plan Total Prescribed Dose: 30 Gy
Reference Point Dosage Given to Date: 18 Gy
Reference Point Session Dosage Given: 2 Gy
Session Number: 34

## 2022-12-16 ENCOUNTER — Other Ambulatory Visit: Payer: Self-pay

## 2022-12-16 ENCOUNTER — Ambulatory Visit
Admission: RE | Admit: 2022-12-16 | Discharge: 2022-12-16 | Disposition: A | Discharge status: Home / Self Care | Payer: Medicare Other | Source: Ambulatory Visit | Attending: Radiation Oncology | Admitting: Radiation Oncology

## 2022-12-16 DIAGNOSIS — R350 Frequency of micturition: Secondary | ICD-10-CM | POA: Diagnosis not present

## 2022-12-16 DIAGNOSIS — R400 Somnolence: Secondary | ICD-10-CM

## 2022-12-16 DIAGNOSIS — Z510 Encounter for antineoplastic radiation therapy: Secondary | ICD-10-CM | POA: Diagnosis not present

## 2022-12-16 DIAGNOSIS — C61 Malignant neoplasm of prostate: Secondary | ICD-10-CM | POA: Diagnosis not present

## 2022-12-16 DIAGNOSIS — Z191 Hormone sensitive malignancy status: Secondary | ICD-10-CM | POA: Diagnosis not present

## 2022-12-16 DIAGNOSIS — R300 Dysuria: Secondary | ICD-10-CM | POA: Diagnosis not present

## 2022-12-16 LAB — RAD ONC ARIA SESSION SUMMARY
Course Elapsed Days: 52
Plan Fractions Treated to Date: 10
Plan Prescribed Dose Per Fraction: 2 Gy
Plan Total Fractions Prescribed: 15
Plan Total Prescribed Dose: 30 Gy
Reference Point Dosage Given to Date: 20 Gy
Reference Point Session Dosage Given: 2 Gy
Session Number: 35

## 2022-12-16 NOTE — Telephone Encounter (Signed)
Sleep Study device not returned, order cancelled, routing to billing and [employer] as requested by Claiborne Rigg

## 2022-12-19 ENCOUNTER — Other Ambulatory Visit: Payer: Self-pay

## 2022-12-19 ENCOUNTER — Ambulatory Visit
Admission: RE | Admit: 2022-12-19 | Discharge: 2022-12-19 | Disposition: A | Discharge status: Home / Self Care | Payer: Medicare Other | Source: Ambulatory Visit | Attending: Radiation Oncology | Admitting: Radiation Oncology

## 2022-12-19 ENCOUNTER — Ambulatory Visit: Payer: Medicare Other

## 2022-12-19 DIAGNOSIS — R350 Frequency of micturition: Secondary | ICD-10-CM | POA: Diagnosis not present

## 2022-12-19 DIAGNOSIS — C61 Malignant neoplasm of prostate: Secondary | ICD-10-CM | POA: Diagnosis not present

## 2022-12-19 DIAGNOSIS — R300 Dysuria: Secondary | ICD-10-CM | POA: Diagnosis not present

## 2022-12-19 DIAGNOSIS — Z191 Hormone sensitive malignancy status: Secondary | ICD-10-CM | POA: Diagnosis not present

## 2022-12-19 DIAGNOSIS — Z510 Encounter for antineoplastic radiation therapy: Secondary | ICD-10-CM | POA: Diagnosis not present

## 2022-12-19 LAB — RAD ONC ARIA SESSION SUMMARY
Course Elapsed Days: 55
Plan Fractions Treated to Date: 11
Plan Prescribed Dose Per Fraction: 2 Gy
Plan Total Fractions Prescribed: 15
Plan Total Prescribed Dose: 30 Gy
Reference Point Dosage Given to Date: 22 Gy
Reference Point Session Dosage Given: 2 Gy
Session Number: 36

## 2022-12-20 ENCOUNTER — Other Ambulatory Visit: Payer: Self-pay

## 2022-12-20 ENCOUNTER — Ambulatory Visit
Admission: RE | Admit: 2022-12-20 | Discharge: 2022-12-20 | Disposition: A | Discharge status: Home / Self Care | Payer: Medicare Other | Source: Ambulatory Visit | Attending: Radiation Oncology | Admitting: Radiation Oncology

## 2022-12-20 ENCOUNTER — Encounter: Primary: Emergency Medicine

## 2022-12-20 DIAGNOSIS — R350 Frequency of micturition: Secondary | ICD-10-CM | POA: Diagnosis not present

## 2022-12-20 DIAGNOSIS — C61 Malignant neoplasm of prostate: Secondary | ICD-10-CM | POA: Diagnosis not present

## 2022-12-20 DIAGNOSIS — Z510 Encounter for antineoplastic radiation therapy: Secondary | ICD-10-CM | POA: Diagnosis not present

## 2022-12-20 DIAGNOSIS — R300 Dysuria: Secondary | ICD-10-CM | POA: Diagnosis not present

## 2022-12-20 DIAGNOSIS — Z191 Hormone sensitive malignancy status: Secondary | ICD-10-CM | POA: Diagnosis not present

## 2022-12-20 LAB — RAD ONC ARIA SESSION SUMMARY
Course Elapsed Days: 56
Plan Fractions Treated to Date: 12
Plan Prescribed Dose Per Fraction: 2 Gy
Plan Total Fractions Prescribed: 15
Plan Total Prescribed Dose: 30 Gy
Reference Point Dosage Given to Date: 24 Gy
Reference Point Session Dosage Given: 2 Gy
Session Number: 37

## 2022-12-21 ENCOUNTER — Other Ambulatory Visit: Payer: Self-pay

## 2022-12-21 ENCOUNTER — Ambulatory Visit: Payer: Medicare Other

## 2022-12-21 ENCOUNTER — Ambulatory Visit
Admission: RE | Admit: 2022-12-21 | Discharge: 2022-12-21 | Disposition: A | Discharge status: Home / Self Care | Payer: Medicare Other | Source: Ambulatory Visit | Attending: Radiation Oncology | Admitting: Radiation Oncology

## 2022-12-21 DIAGNOSIS — R350 Frequency of micturition: Secondary | ICD-10-CM | POA: Diagnosis not present

## 2022-12-21 DIAGNOSIS — R300 Dysuria: Secondary | ICD-10-CM | POA: Diagnosis not present

## 2022-12-21 DIAGNOSIS — Z191 Hormone sensitive malignancy status: Secondary | ICD-10-CM | POA: Diagnosis not present

## 2022-12-21 DIAGNOSIS — C61 Malignant neoplasm of prostate: Secondary | ICD-10-CM | POA: Diagnosis not present

## 2022-12-21 DIAGNOSIS — Z510 Encounter for antineoplastic radiation therapy: Secondary | ICD-10-CM | POA: Diagnosis not present

## 2022-12-21 LAB — RAD ONC ARIA SESSION SUMMARY
Course Elapsed Days: 57
Plan Fractions Treated to Date: 13
Plan Prescribed Dose Per Fraction: 2 Gy
Plan Total Fractions Prescribed: 15
Plan Total Prescribed Dose: 30 Gy
Reference Point Dosage Given to Date: 26 Gy
Reference Point Session Dosage Given: 2 Gy
Session Number: 38

## 2022-12-22 ENCOUNTER — Ambulatory Visit
Admission: RE | Admit: 2022-12-22 | Discharge: 2022-12-22 | Disposition: A | Discharge status: Home / Self Care | Payer: Medicare Other | Source: Ambulatory Visit | Attending: Radiation Oncology | Admitting: Radiation Oncology

## 2022-12-22 ENCOUNTER — Other Ambulatory Visit: Payer: Self-pay

## 2022-12-22 ENCOUNTER — Ambulatory Visit: Payer: Medicare Other

## 2022-12-22 DIAGNOSIS — C61 Malignant neoplasm of prostate: Secondary | ICD-10-CM | POA: Diagnosis not present

## 2022-12-22 DIAGNOSIS — R350 Frequency of micturition: Secondary | ICD-10-CM | POA: Diagnosis not present

## 2022-12-22 DIAGNOSIS — Z191 Hormone sensitive malignancy status: Secondary | ICD-10-CM | POA: Diagnosis not present

## 2022-12-22 DIAGNOSIS — Z510 Encounter for antineoplastic radiation therapy: Secondary | ICD-10-CM | POA: Diagnosis not present

## 2022-12-22 DIAGNOSIS — R300 Dysuria: Secondary | ICD-10-CM | POA: Diagnosis not present

## 2022-12-22 LAB — RAD ONC ARIA SESSION SUMMARY
Course Elapsed Days: 58
Plan Fractions Treated to Date: 14
Plan Prescribed Dose Per Fraction: 2 Gy
Plan Total Fractions Prescribed: 15
Plan Total Prescribed Dose: 30 Gy
Reference Point Dosage Given to Date: 28 Gy
Reference Point Session Dosage Given: 2 Gy
Session Number: 39

## 2022-12-23 ENCOUNTER — Ambulatory Visit: Payer: Medicare Other

## 2022-12-23 ENCOUNTER — Ambulatory Visit
Admission: RE | Admit: 2022-12-23 | Discharge: 2022-12-23 | Disposition: A | Discharge status: Home / Self Care | Payer: Medicare Other | Source: Ambulatory Visit | Attending: Radiation Oncology | Admitting: Radiation Oncology

## 2022-12-23 ENCOUNTER — Encounter: Payer: Self-pay | Admitting: Urology

## 2022-12-23 ENCOUNTER — Other Ambulatory Visit: Payer: Self-pay

## 2022-12-23 DIAGNOSIS — Z510 Encounter for antineoplastic radiation therapy: Secondary | ICD-10-CM | POA: Diagnosis not present

## 2022-12-23 DIAGNOSIS — C61 Malignant neoplasm of prostate: Secondary | ICD-10-CM | POA: Diagnosis not present

## 2022-12-23 DIAGNOSIS — Z191 Hormone sensitive malignancy status: Secondary | ICD-10-CM | POA: Diagnosis not present

## 2022-12-23 DIAGNOSIS — R350 Frequency of micturition: Secondary | ICD-10-CM | POA: Diagnosis not present

## 2022-12-23 DIAGNOSIS — R300 Dysuria: Secondary | ICD-10-CM | POA: Diagnosis not present

## 2022-12-23 LAB — RAD ONC ARIA SESSION SUMMARY
Course Elapsed Days: 59
Plan Fractions Treated to Date: 15
Plan Prescribed Dose Per Fraction: 2 Gy
Plan Total Fractions Prescribed: 15
Plan Total Prescribed Dose: 30 Gy
Reference Point Dosage Given to Date: 30 Gy
Reference Point Session Dosage Given: 2 Gy
Session Number: 40

## 2022-12-26 ENCOUNTER — Ambulatory Visit: Payer: Medicare Other

## 2022-12-27 ENCOUNTER — Encounter: Attending: Emergency Medicine | Primary: Emergency Medicine

## 2022-12-30 NOTE — Progress Notes (Signed)
Patient was presented to the [facility] on 10/24 for his stage T2a adenocarcinoma of the prostate with a Gleason's score of 4+5 and a PSA of 9.65 on finasteride.  Patient proceed with treatment recommendations of LT-ADT concurrent with an 8-week course of daily prostate IMRT and had his final radiation treatment on 4/12.   Patient is scheduled for a post treatment nurse call on 5/21 and was last seen by urology on 4/2 where he received ADT injection, and will continue with hormonal therapy and post treatment PSA checks with Dr. Annabell Howells.

## 2023-01-03 ENCOUNTER — Encounter: Payer: MEDICARE | Primary: Emergency Medicine

## 2023-01-12 ENCOUNTER — Other Ambulatory Visit: Payer: Self-pay | Admitting: Urology

## 2023-01-12 DIAGNOSIS — C61 Malignant neoplasm of prostate: Secondary | ICD-10-CM

## 2023-01-12 NOTE — Progress Notes (Signed)
  Radiation Oncology         [phone]) [phone] ________________________________  Name: Russell Trevino MRN: [id number]  Date: 12/23/2022  DOB: [birth year]  End of Treatment Note  Diagnosis:   72 y.o. gentleman with stage T2a adenocarcinoma of the prostate with a Gleason's score of 4+5 and a PSA of 9.65 on finasteride      Indication for treatment:  Curative, Definitive Radiotherapy       Radiation treatment dates:   10/25/22 - 12/23/22  Site/dose:  1. The prostate, seminal vesicles, and pelvic lymph nodes were initially treated to 45 Gy in 25 fractions of 1.8 Gy  2. The prostate only was boosted to 75 Gy with 15 additional fractions of 2.0 Gy   Beams/energy:  1. The prostate, seminal vesicles, and pelvic lymph nodes were initially treated using VMAT intensity modulated radiotherapy delivering 6 megavolt photons. Image guidance was performed with CB-CT studies prior to each fraction. He was immobilized with a body fix lower extremity mold.  2. the prostate only was boosted using VMAT intensity modulated radiotherapy delivering 6 megavolt photons. Image guidance was performed with CB-CT studies prior to each fraction. He was immobilized with a body fix lower extremity mold.  Narrative: The patient tolerated radiation treatment relatively well.   The patient experienced some minor urinary irritation and modest fatigue.  He did experience some nocturia and frequency which he was managing with Flomax but had to stop due to dizziness/orthostatic hypotension.  Plan: The patient has completed radiation treatment. He will return to radiation oncology clinic for routine followup in one month. I advised him to call or return sooner if he has any questions or concerns related to his recovery or treatment. ________________________________  Artist Pais. Kathrynn Running, M.D.

## 2023-01-17 DIAGNOSIS — Z5111 Encounter for antineoplastic chemotherapy: Secondary | ICD-10-CM | POA: Diagnosis not present

## 2023-01-29 ENCOUNTER — Encounter: Payer: Self-pay | Admitting: Internal Medicine

## 2023-01-29 NOTE — Progress Notes (Unsigned)
    Subjective:    Patient ID: Russell Trevino, male    DOB: October 19, 1950, 72 y.o.   MRN: [id number]      HPI [facility] is here for No chief complaint on file.    Fatigue, dizzy when walking -     Medications and allergies reviewed with patient and updated if appropriate.  Current Outpatient Medications on File Prior to Visit  Medication Sig Dispense Refill   amLODipine (NORVASC) 5 MG tablet Take 1 tablet by mouth daily.     ezetimibe (ZETIA) 10 MG tablet Take 10 mg by mouth daily.     famotidine (PEPCID) 40 MG tablet Take 40 mg by mouth at bedtime.     finasteride (PROSCAR) 5 MG tablet TAKE 1 TABLET(5 MG) BY MOUTH DAILY (Patient taking differently: Take 5 mg by mouth at bedtime.) 90 tablet 1   insulin glargine (LANTUS SOLOSTAR) 100 UNIT/ML Solostar Pen Inject 30 Units into the skin 2 (two) times daily. 30 mL 5   Insulin Pen Needle 31G X 6 MM MISC UAD for insulin pen 60 each 5   Insulin Syringe-Needle U-100 (BD VEO INSULIN SYRINGE U/F) 31G X 15/64" 1 ML MISC use as directed TO INJECT INSULIN  twice a day 10 each 2   isosorbide mononitrate (IMDUR) 120 MG 24 hr tablet Take 1 tablet (120 mg total) by mouth 2 (two) times daily. 180 tablet 3   metoprolol succinate (TOPROL-XL) 50 MG 24 hr tablet Take 1 tablet (50 mg total) by mouth daily at 12 noon. Take with or immediately following a meal. 90 tablet 3   nitroGLYCERIN (NITROSTAT) 0.4 MG SL tablet PLACE 1 TABLET UNDER THE TONGUE EVERY 5 MINS FOR CHEST FOR PAIN 100 tablet 0   ondansetron (ZOFRAN-ODT) 8 MG disintegrating tablet Take 1 tablet (8 mg total) by mouth every 8 (eight) hours as needed for nausea or vomiting. 20 tablet 1   pantoprazole (PROTONIX) 40 MG tablet Take 40 mg by mouth daily.     ranolazine (RANEXA) 500 MG 12 hr tablet Take 500 mg by mouth 2 (two) times daily.     rosuvastatin (CRESTOR) 40 MG tablet Take 1 tablet (40 mg total) by mouth daily. 90 tablet 3   sacubitril-valsartan (ENTRESTO) 49-51 MG Take 1 tablet by mouth 2  (two) times daily. 60 tablet 6   Semaglutide, 2 MG/DOSE, 8 MG/3ML SOPN Inject 2 mg into the skin once a week.     sulfamethoxazole-trimethoprim (BACTRIM DS) 800-160 MG tablet Take 1 tablet by mouth 2 (two) times daily. 20 tablet 0   tamsulosin (FLOMAX) 0.4 MG CAPS capsule Take 1 capsule (0.4 mg total) by mouth daily after supper. 30 capsule 5   No current facility-administered medications on file prior to visit.    Review of Systems     Objective:  There were no vitals filed for this visit. BP Readings from Last 3 Encounters:  11/24/22 110/62  10/06/22 (!) 148/70  10/04/22 131/69   Wt Readings from Last 3 Encounters:  11/24/22 224 lb 3.2 oz (101.7 kg)  10/06/22 223 lb 15.8 oz (101.6 kg)  10/04/22 224 lb (101.6 kg)   There is no height or weight on file to calculate BMI.    Physical Exam         Assessment & Plan:    See Problem List for Assessment and Plan of chronic medical problems.

## 2023-01-30 ENCOUNTER — Ambulatory Visit (INDEPENDENT_AMBULATORY_CARE_PROVIDER_SITE_OTHER): Payer: Medicare Other | Admitting: Internal Medicine

## 2023-01-30 VITALS — BP 120/62 | HR 78 | Temp 98.1°F | Ht 72.0 in | Wt 210.0 lb

## 2023-01-30 DIAGNOSIS — Z794 Long term (current) use of insulin: Secondary | ICD-10-CM

## 2023-01-30 DIAGNOSIS — E1122 Type 2 diabetes mellitus with diabetic chronic kidney disease: Secondary | ICD-10-CM

## 2023-01-30 DIAGNOSIS — E782 Mixed hyperlipidemia: Secondary | ICD-10-CM

## 2023-01-30 DIAGNOSIS — N1832 Chronic kidney disease, stage 3b: Secondary | ICD-10-CM | POA: Diagnosis not present

## 2023-01-30 DIAGNOSIS — I10 Essential (primary) hypertension: Secondary | ICD-10-CM

## 2023-01-30 LAB — LIPID PANEL
Cholesterol: 145 mg/dL (ref 0–200)
HDL: 43.5 mg/dL (ref >39.00)
NonHDL: 101.73
Total CHOL/HDL Ratio: 3
Triglycerides: 242 mg/dL — ABNORMAL HIGH (ref 0.0–149.0)
VLDL: 48.4 mg/dL — ABNORMAL HIGH (ref 0.0–40.0)

## 2023-01-30 LAB — COMPREHENSIVE METABOLIC PANEL
ALT: 13 U/L (ref 0–53)
AST: 15 U/L (ref 0–37)
Albumin: 3.9 g/dL (ref 3.5–5.2)
Alkaline Phosphatase: 56 U/L (ref 39–117)
BUN: 37 mg/dL — ABNORMAL HIGH (ref 6–23)
CO2: 25 mEq/L (ref 19–32)
Calcium: 9.3 mg/dL (ref 8.4–10.5)
Chloride: 101 mEq/L (ref 96–112)
Creatinine, Ser: 2.22 mg/dL — ABNORMAL HIGH (ref 0.40–1.50)
GFR: 28.86 mL/min — ABNORMAL LOW (ref >60.00)
Glucose, Bld: 244 mg/dL — ABNORMAL HIGH (ref 70–99)
Potassium: 4.6 mEq/L (ref 3.5–5.1)
Sodium: 136 mEq/L (ref 135–145)
Total Bilirubin: 1.3 mg/dL — ABNORMAL HIGH (ref 0.2–1.2)
Total Protein: 6.8 g/dL (ref 6.0–8.3)

## 2023-01-30 LAB — CBC WITH DIFFERENTIAL/PLATELET
Basophils Absolute: 0 10*3/uL (ref 0.0–0.1)
Basophils Relative: 0.5 % (ref 0.0–3.0)
Eosinophils Absolute: 0.2 10*3/uL (ref 0.0–0.7)
Eosinophils Relative: 3.6 % (ref 0.0–5.0)
HCT: 32.4 % — ABNORMAL LOW (ref 39.0–52.0)
Hemoglobin: 11.5 g/dL — ABNORMAL LOW (ref 13.0–17.0)
Lymphocytes Relative: 15.4 % (ref 12.0–46.0)
Lymphs Abs: 0.8 10*3/uL (ref 0.7–4.0)
MCHC: 35.4 g/dL (ref 30.0–36.0)
MCV: 92 fl (ref 78.0–100.0)
Monocytes Absolute: 0.5 10*3/uL (ref 0.1–1.0)
Monocytes Relative: 9.5 % (ref 3.0–12.0)
Neutro Abs: 3.9 10*3/uL (ref 1.4–7.7)
Neutrophils Relative %: 71 % (ref 43.0–77.0)
Platelets: 175 10*3/uL (ref 150.0–400.0)
RBC: 3.53 Mil/uL — ABNORMAL LOW (ref 4.22–5.81)
RDW: 13.9 % (ref 11.5–15.5)
WBC: 5.4 10*3/uL (ref 4.0–10.5)

## 2023-01-30 LAB — HEMOGLOBIN A1C: Hgb A1c MFr Bld: 7.3 % — ABNORMAL HIGH (ref 4.6–6.5)

## 2023-01-30 LAB — MICROALBUMIN / CREATININE URINE RATIO
Creatinine,U: 118.7 mg/dL
Microalb Creat Ratio: 54.1 mg/g — ABNORMAL HIGH (ref 0.0–30.0)
Microalb, Ur: 64.3 mg/dL — ABNORMAL HIGH (ref 0.0–1.9)

## 2023-01-30 LAB — LDL CHOLESTEROL, DIRECT: Direct LDL: 65 mg/dL

## 2023-01-30 NOTE — Assessment & Plan Note (Signed)
Chronic Symptoms he is experiencing lightheadedness when standing and walking secondary to orthostatic hypotension-positive orthostatics here today Likely he is overmedicated because of recent weight loss Stop amlodipine 5 mg daily Continue other medications including Imdur 120 mg twice daily, metoprolol XL 50 mg daily, Entresto 49-51 mg twice daily Monitor BP at home

## 2023-01-30 NOTE — Patient Instructions (Addendum)
      Blood work was ordered.   The lab is on the first floor.    Medications changes include :   take stool softener daily, can take miralax daily for the constipation.      Stop amlodipine for now.  Monitor your BP at home.  Goal BP < 130/80.     Return if symptoms worsen or fail to improve.

## 2023-01-30 NOTE — Assessment & Plan Note (Signed)
Chronic Check lipid panel  Continue Zetia 10 mg daily, Crestor 40 mg daily

## 2023-01-30 NOTE — Assessment & Plan Note (Signed)
Chronic CMP 

## 2023-01-30 NOTE — Assessment & Plan Note (Signed)
Chronic Has PCP in [place] who has been prescribing his medications Has had recent weight loss with radiation treatment for prostate cancer-secondary to decreased appetite Check A1c, urine microalbumin Continue insulin 30 units twice daily, Ozempic 2 mg weekly

## 2023-01-31 ENCOUNTER — Ambulatory Visit
Admission: RE | Admit: 2023-01-31 | Discharge: 2023-01-31 | Disposition: A | Discharge status: Home / Self Care | Payer: Medicare Other | Source: Ambulatory Visit | Attending: Radiation Oncology | Admitting: Radiation Oncology

## 2023-01-31 NOTE — Progress Notes (Signed)
  Radiation Oncology         [id number]) [phone] ________________________________  Name: Russell Trevino MRN: [id number]  Date of Service: 01/31/2023  DOB: 10-13-1950  Post Treatment Telephone Note  Diagnosis:  72 y.o. gentleman with stage T2a adenocarcinoma of the prostate with a Gleason's score of 4+5 and a PSA of 9.65 on finasteride       Indication for treatment:  Curative, Definitive Radiotherapy        Radiation treatment dates:   10/25/22 - 12/23/22(as documented in provider EOT note)  The patient was available for call today.   Symptoms of fatigue have improved since completing therapy.  Symptoms of bladder changes have improved since completing therapy. Current symptoms include none, and medications for bladder symptoms include none.  Symptoms of bowel changes have improved since completing therapy. Current symptoms include none, and medications for bowel symptoms include none.   Post Treatment IPSS Score: IPSS Questionnaire (AUA-7): Over the past month.   1)  How often have you had a sensation of not emptying your bladder completely after you finish urinating?  0 - Not at all  2)  How often have you had to urinate again less than two hours after you finished urinating? 0 - Not at all  3)  How often have you found you stopped and started again several times when you urinated?  0 - Not at all  4) How difficult have you found it to postpone urination?  0 - Not at all  5) How often have you had a weak urinary stream?  1 - Less than 1 time in 5  6) How often have you had to push or strain to begin urination?  0 - Not at all  7) How many times did you most typically get up to urinate from the time you went to bed until the time you got up in the morning?  2 - 2 times  Total score:  3. Which indicates mild symptoms  0-7 mildly symptomatic   8-19 moderately symptomatic   20-35 severely symptomatic    Patient will follow up w/ Rad/Onc PRN for ongoing surveillance. He was counseled  that PSA levels will be drawn in the urology office, and was reassured that additional time is expected to improve bowel and bladder symptoms. He was encouraged to call back with concerns or questions regarding radiation.   This concludes the interview.   Ruel Favors, LPN

## 2023-02-13 DIAGNOSIS — R3912 Poor urinary stream: Secondary | ICD-10-CM | POA: Diagnosis not present

## 2023-02-16 ENCOUNTER — Encounter: Payer: Self-pay | Admitting: *Deleted

## 2023-02-16 DIAGNOSIS — Z5111 Encounter for antineoplastic chemotherapy: Secondary | ICD-10-CM | POA: Diagnosis not present

## 2023-03-02 ENCOUNTER — Ambulatory Visit (HOSPITAL_BASED_OUTPATIENT_CLINIC_OR_DEPARTMENT_OTHER): Payer: Medicare Other | Admitting: Cardiology

## 2023-03-03 ENCOUNTER — Encounter

## 2023-03-17 ENCOUNTER — Encounter

## 2023-03-20 DIAGNOSIS — Z5111 Encounter for antineoplastic chemotherapy: Secondary | ICD-10-CM | POA: Diagnosis not present

## 2023-03-20 MED ORDER — LANTUS SOLOSTAR 100 UNIT/ML SC SOPN
100 UNIT/ML | SUBCUTANEOUS | 0 refills | Status: AC
Start: 2023-03-20 — End: 2023-11-02

## 2023-04-10 DIAGNOSIS — E113493 Type 2 diabetes mellitus with severe nonproliferative diabetic retinopathy without macular edema, bilateral: Secondary | ICD-10-CM | POA: Diagnosis not present

## 2023-04-10 DIAGNOSIS — H4311 Vitreous hemorrhage, right eye: Secondary | ICD-10-CM | POA: Diagnosis not present

## 2023-04-14 ENCOUNTER — Encounter (HOSPITAL_BASED_OUTPATIENT_CLINIC_OR_DEPARTMENT_OTHER): Payer: Self-pay

## 2023-04-14 ENCOUNTER — Telehealth (HOSPITAL_BASED_OUTPATIENT_CLINIC_OR_DEPARTMENT_OTHER): Payer: Self-pay

## 2023-04-14 DIAGNOSIS — E113591 Type 2 diabetes mellitus with proliferative diabetic retinopathy without macular edema, right eye: Secondary | ICD-10-CM | POA: Diagnosis not present

## 2023-04-14 DIAGNOSIS — H43822 Vitreomacular adhesion, left eye: Secondary | ICD-10-CM | POA: Diagnosis not present

## 2023-04-14 DIAGNOSIS — E113512 Type 2 diabetes mellitus with proliferative diabetic retinopathy with macular edema, left eye: Secondary | ICD-10-CM | POA: Diagnosis not present

## 2023-04-14 DIAGNOSIS — H4311 Vitreous hemorrhage, right eye: Secondary | ICD-10-CM | POA: Diagnosis not present

## 2023-04-14 DIAGNOSIS — H2513 Age-related nuclear cataract, bilateral: Secondary | ICD-10-CM | POA: Diagnosis not present

## 2023-04-14 NOTE — Telephone Encounter (Signed)
   Pre-operative Risk Assessment    Patient Name: Russell Trevino  DOB: [birth year] MRN: [id number]      Request for Surgical Clearance    Procedure:   Vitrectomy, Panretinal Photocoagulation, and Intravitreal Injection of Eylea  Date of Surgery:  Clearance 04/17/23                                 Surgeon:  Dr. Barbara Cower B. Allyne Gee Surgeon's Group or Practice Name:  [facility] number:  [phone] Fax number:  [phone]   Type of Clearance Requested:   - Medical    Type of Anesthesia:  MAC   Additional requests/questions:   None  Lorella Nimrod   04/14/2023, 10:19 AM

## 2023-04-14 NOTE — Telephone Encounter (Signed)
   Name: Russell Trevino  DOB: July 28, 1951  MRN: [id number]  Primary Cardiologist: Jodelle Red, MD   Preoperative team, please contact this patient and set up a phone call appointment for further preoperative risk assessment. Please obtain consent and complete medication review. Thank you for your help.  I confirm that guidance regarding antiplatelet and oral anticoagulation therapy has been completed and, if necessary, noted below.  None requested.    Carlos Levering, NP 04/14/2023, 11:33 AM [facility]

## 2023-04-14 NOTE — Telephone Encounter (Signed)
1st attempt to call pt and schedule for today for clearance. I lvmtrc

## 2023-04-17 NOTE — Telephone Encounter (Signed)
Looks like pt scheduled for surgery today, however, Cardiology could not get in touch with pt for clearance. Will send back to the requesting surgeon's office to make them aware.  If they reschedule pt's surgery, please fax over a new clearance with the new date.    Thanks!

## 2023-04-17 NOTE — Telephone Encounter (Signed)
2nd attempt, left pt a message to call back and ask for the preop team.

## 2023-04-18 DIAGNOSIS — E113591 Type 2 diabetes mellitus with proliferative diabetic retinopathy without macular edema, right eye: Secondary | ICD-10-CM | POA: Diagnosis not present

## 2023-04-18 DIAGNOSIS — H4311 Vitreous hemorrhage, right eye: Secondary | ICD-10-CM | POA: Diagnosis not present

## 2023-04-18 DIAGNOSIS — H43822 Vitreomacular adhesion, left eye: Secondary | ICD-10-CM | POA: Diagnosis not present

## 2023-04-18 DIAGNOSIS — E113512 Type 2 diabetes mellitus with proliferative diabetic retinopathy with macular edema, left eye: Secondary | ICD-10-CM | POA: Diagnosis not present

## 2023-04-18 DIAGNOSIS — H2513 Age-related nuclear cataract, bilateral: Secondary | ICD-10-CM | POA: Diagnosis not present

## 2023-04-20 DIAGNOSIS — Z5111 Encounter for antineoplastic chemotherapy: Secondary | ICD-10-CM | POA: Diagnosis not present

## 2023-05-09 DIAGNOSIS — E113512 Type 2 diabetes mellitus with proliferative diabetic retinopathy with macular edema, left eye: Secondary | ICD-10-CM | POA: Diagnosis not present

## 2023-05-09 DIAGNOSIS — E113591 Type 2 diabetes mellitus with proliferative diabetic retinopathy without macular edema, right eye: Secondary | ICD-10-CM | POA: Diagnosis not present

## 2023-05-29 DIAGNOSIS — Z5111 Encounter for antineoplastic chemotherapy: Secondary | ICD-10-CM | POA: Diagnosis not present

## 2023-06-07 DIAGNOSIS — E113591 Type 2 diabetes mellitus with proliferative diabetic retinopathy without macular edema, right eye: Secondary | ICD-10-CM | POA: Diagnosis not present

## 2023-06-07 DIAGNOSIS — E113512 Type 2 diabetes mellitus with proliferative diabetic retinopathy with macular edema, left eye: Secondary | ICD-10-CM | POA: Diagnosis not present

## 2023-06-07 DIAGNOSIS — H2513 Age-related nuclear cataract, bilateral: Secondary | ICD-10-CM | POA: Diagnosis not present

## 2023-06-07 DIAGNOSIS — H43822 Vitreomacular adhesion, left eye: Secondary | ICD-10-CM | POA: Diagnosis not present

## 2023-06-07 DIAGNOSIS — H4311 Vitreous hemorrhage, right eye: Secondary | ICD-10-CM | POA: Diagnosis not present

## 2023-06-20 ENCOUNTER — Encounter: Payer: Self-pay | Admitting: Pharmacist

## 2023-06-20 ENCOUNTER — Encounter: Primary: Emergency Medicine

## 2023-06-20 NOTE — Progress Notes (Signed)
Pharmacy Quality Measure Review  This patient is appearing on a report for being at risk of failing the adherence measure for cholesterol (statin) medications this calendar year.   Medication: rosuvastatin 40 mg daily Last fill date: 06/12/2023 for 100 day supply  Insurance report was not up to date. No action needed at this time.    Arbutus Leas, PharmD, BCPS [facility] [phone]

## 2023-06-27 ENCOUNTER — Encounter: Payer: Self-pay | Admitting: Internal Medicine

## 2023-06-27 NOTE — Progress Notes (Unsigned)
Subjective:    Patient ID: Russell Trevino, male    DOB: Apr 28, 1951, 72 y.o.   MRN: [id number]      HPI Russell Trevino is here for No chief complaint on file.    Feels bad when waking up -     Medications and allergies reviewed with patient and updated if appropriate.  Current Outpatient Medications on File Prior to Visit  Medication Sig Dispense Refill   amoxicillin (AMOXIL) 500 MG tablet Take 500 mg by mouth every 8 (eight) hours.     eszopiclone (LUNESTA) 2 MG TABS tablet SMARTSIG:1 Tablet(s) By Mouth Every Evening     ezetimibe (ZETIA) 10 MG tablet Take 10 mg by mouth daily.     famotidine (PEPCID) 40 MG tablet Take 40 mg by mouth at bedtime.     finasteride (PROSCAR) 5 MG tablet TAKE 1 TABLET(5 MG) BY MOUTH DAILY (Patient taking differently: Take 5 mg by mouth at bedtime.) 90 tablet 1   insulin glargine (LANTUS SOLOSTAR) 100 UNIT/ML Solostar Pen Inject 30 Units into the skin 2 (two) times daily. 30 mL 5   Insulin Pen Needle 31G X 6 MM MISC UAD for insulin pen 60 each 5   Insulin Syringe-Needle U-100 (BD VEO INSULIN SYRINGE U/F) 31G X 15/64" 1 ML MISC use as directed TO INJECT INSULIN  twice a day 10 each 2   isosorbide mononitrate (IMDUR) 120 MG 24 hr tablet Take 1 tablet (120 mg total) by mouth 2 (two) times daily. 180 tablet 3   metoprolol succinate (TOPROL-XL) 50 MG 24 hr tablet Take 1 tablet (50 mg total) by mouth daily at 12 noon. Take with or immediately following a meal. 90 tablet 3   nitroGLYCERIN (NITROSTAT) 0.4 MG SL tablet PLACE 1 TABLET UNDER THE TONGUE EVERY 5 MINS FOR CHEST FOR PAIN 100 tablet 0   ondansetron (ZOFRAN-ODT) 8 MG disintegrating tablet Take 1 tablet (8 mg total) by mouth every 8 (eight) hours as needed for nausea or vomiting. 20 tablet 1   pantoprazole (PROTONIX) 40 MG tablet Take 40 mg by mouth daily.     ranolazine (RANEXA) 500 MG 12 hr tablet Take 500 mg by mouth 2 (two) times daily.     rosuvastatin (CRESTOR) 40 MG tablet Take 1 tablet (40 mg  total) by mouth daily. 90 tablet 3   sacubitril-valsartan (ENTRESTO) 49-51 MG Take 1 tablet by mouth 2 (two) times daily. 60 tablet 6   Semaglutide, 2 MG/DOSE, 8 MG/3ML SOPN Inject 2 mg into the skin once a week.     No current facility-administered medications on file prior to visit.    Review of Systems     Objective:  There were no vitals filed for this visit. BP Readings from Last 3 Encounters:  01/30/23 120/62  11/24/22 110/62  10/06/22 (!) 148/70   Wt Readings from Last 3 Encounters:  01/30/23 210 lb (95.3 kg)  11/24/22 224 lb 3.2 oz (101.7 kg)  10/06/22 223 lb 15.8 oz (101.6 kg)   There is no height or weight on file to calculate BMI.    Physical Exam         Assessment & Plan:    See Problem List for Assessment and Plan of chronic medical problems.

## 2023-06-28 ENCOUNTER — Ambulatory Visit (INDEPENDENT_AMBULATORY_CARE_PROVIDER_SITE_OTHER): Payer: Medicare Other

## 2023-06-28 ENCOUNTER — Other Ambulatory Visit: Payer: Self-pay

## 2023-06-28 ENCOUNTER — Ambulatory Visit: Payer: Medicare Other | Admitting: Internal Medicine

## 2023-06-28 VITALS — BP 124/78 | HR 66 | Temp 98.0°F | Ht 72.0 in | Wt 216.0 lb

## 2023-06-28 DIAGNOSIS — D649 Anemia, unspecified: Secondary | ICD-10-CM | POA: Diagnosis not present

## 2023-06-28 DIAGNOSIS — E782 Mixed hyperlipidemia: Secondary | ICD-10-CM | POA: Diagnosis not present

## 2023-06-28 DIAGNOSIS — Z794 Long term (current) use of insulin: Secondary | ICD-10-CM

## 2023-06-28 DIAGNOSIS — E1122 Type 2 diabetes mellitus with diabetic chronic kidney disease: Secondary | ICD-10-CM

## 2023-06-28 DIAGNOSIS — K219 Gastro-esophageal reflux disease without esophagitis: Secondary | ICD-10-CM

## 2023-06-28 DIAGNOSIS — N1832 Chronic kidney disease, stage 3b: Secondary | ICD-10-CM | POA: Diagnosis not present

## 2023-06-28 DIAGNOSIS — I5042 Chronic combined systolic (congestive) and diastolic (congestive) heart failure: Secondary | ICD-10-CM

## 2023-06-28 DIAGNOSIS — I10 Essential (primary) hypertension: Secondary | ICD-10-CM

## 2023-06-28 DIAGNOSIS — I25118 Atherosclerotic heart disease of native coronary artery with other forms of angina pectoris: Secondary | ICD-10-CM

## 2023-06-28 DIAGNOSIS — I25119 Atherosclerotic heart disease of native coronary artery with unspecified angina pectoris: Secondary | ICD-10-CM | POA: Diagnosis not present

## 2023-06-28 LAB — CBC WITH DIFFERENTIAL/PLATELET
Basophils Absolute: 0 10*3/uL (ref 0.0–0.1)
Basophils Relative: 0.5 % (ref 0.0–3.0)
Eosinophils Absolute: 0.2 10*3/uL (ref 0.0–0.7)
Eosinophils Relative: 3.9 % (ref 0.0–5.0)
HCT: 33.8 % — ABNORMAL LOW (ref 39.0–52.0)
Hemoglobin: 11.4 g/dL — ABNORMAL LOW (ref 13.0–17.0)
Lymphocytes Relative: 13.4 % (ref 12.0–46.0)
Lymphs Abs: 0.8 10*3/uL (ref 0.7–4.0)
MCHC: 33.7 g/dL (ref 30.0–36.0)
MCV: 89.9 fL (ref 78.0–100.0)
Monocytes Absolute: 0.4 10*3/uL (ref 0.1–1.0)
Monocytes Relative: 7.8 % (ref 3.0–12.0)
Neutro Abs: 4.3 10*3/uL (ref 1.4–7.7)
Neutrophils Relative %: 74.4 % (ref 43.0–77.0)
Platelets: 130 10*3/uL — ABNORMAL LOW (ref 150.0–400.0)
RBC: 3.76 Mil/uL — ABNORMAL LOW (ref 4.22–5.81)
RDW: 14.7 % (ref 11.5–15.5)
WBC: 5.7 10*3/uL (ref 4.0–10.5)

## 2023-06-28 LAB — COMPREHENSIVE METABOLIC PANEL
ALT: 18 U/L (ref 0–53)
AST: 14 U/L (ref 0–37)
Albumin: 4 g/dL (ref 3.5–5.2)
Alkaline Phosphatase: 68 U/L (ref 39–117)
BUN: 29 mg/dL — ABNORMAL HIGH (ref 6–23)
CO2: 24 meq/L (ref 19–32)
Calcium: 9.5 mg/dL (ref 8.4–10.5)
Chloride: 101 meq/L (ref 96–112)
Creatinine, Ser: 1.82 mg/dL — ABNORMAL HIGH (ref 0.40–1.50)
GFR: 36.52 mL/min — ABNORMAL LOW (ref >60.00)
Glucose, Bld: 456 mg/dL — ABNORMAL HIGH (ref 70–99)
Potassium: 4.8 meq/L (ref 3.5–5.1)
Sodium: 133 meq/L — ABNORMAL LOW (ref 135–145)
Total Bilirubin: 1.5 mg/dL — ABNORMAL HIGH (ref 0.2–1.2)
Total Protein: 7 g/dL (ref 6.0–8.3)

## 2023-06-28 LAB — LIPID PANEL
Cholesterol: 152 mg/dL (ref 0–200)
HDL: 53.1 mg/dL (ref >39.00)
LDL Cholesterol: 60 mg/dL (ref 0–99)
NonHDL: 98.92
Total CHOL/HDL Ratio: 3
Triglycerides: 193 mg/dL — ABNORMAL HIGH (ref 0.0–149.0)
VLDL: 38.6 mg/dL (ref 0.0–40.0)

## 2023-06-28 LAB — FERRITIN: Ferritin: 176.7 ng/mL (ref 22.0–322.0)

## 2023-06-28 LAB — TSH: TSH: 1.65 u[IU]/mL (ref 0.35–5.50)

## 2023-06-28 LAB — IBC PANEL
Iron: 66 ug/dL (ref 42–165)
Saturation Ratios: 22.9 % (ref 20.0–50.0)
TIBC: 288.4 ug/dL (ref 250.0–450.0)
Transferrin: 206 mg/dL — ABNORMAL LOW (ref 212.0–360.0)

## 2023-06-28 MED ORDER — ISOSORBIDE MONONITRATE ER 120 MG PO TB24
120.0000 mg | ORAL_TABLET | Freq: Every day | ORAL | Status: DC
Start: 2023-06-28 — End: 2024-02-09

## 2023-06-28 NOTE — Assessment & Plan Note (Signed)
Chronic Following with cardiology but has not seen them in a while Has decreased stamina, DOE, lightheadedness, palpitations,fatigue ? Symptoms cardiac in nature - Imdur decreased to daily due to orthostatic lightheadedness Stressed need to follow up with cardiology

## 2023-06-28 NOTE — Assessment & Plan Note (Signed)
Chronic Check lipid panel  Continue Zetia 10 mg daily, Crestor 40 mg daily

## 2023-06-28 NOTE — Assessment & Plan Note (Signed)
Chronic CMP

## 2023-06-28 NOTE — Assessment & Plan Note (Signed)
Chronic Symptoms he is experiencing lightheadedness when standing and walking secondary to orthostatic hypotension-positive orthostatics here today Likely he is overmedicated because of recent weight loss Decrease Imdur to 120 mg once daily Continue other medications including metoprolol XL 50 mg daily, Entresto 49-51 mg twice daily May need to further adjust medication Advised f/u with cardiology Advised if he developed chest pain needs to increase imdur back to bid

## 2023-06-28 NOTE — Patient Instructions (Addendum)
      Blood work was ordered.   The lab is on the first floor.    Medications changes include :   try taking the imdur (isosorbide mononitrate) once a day.  Go back to twice a day if you have chest pain.      Follow up with your cardiologist.     Return if symptoms worsen or fail to improve.

## 2023-06-28 NOTE — Assessment & Plan Note (Signed)
Chronic Following with cardiology Last EF 20-25% Appears euvolemic Lasix 40 mg as needed, metoprolol XL 50 mg daily, Entresto 49-51 mg twice daily

## 2023-06-28 NOTE — Assessment & Plan Note (Signed)
Chronic Has PCP in [place] who has been prescribing his medications Has been losing weight Check A1c Continue insulin 30 units twice daily, Ozempic 2 mg weekly

## 2023-06-29 LAB — HEMOGLOBIN A1C: Hgb A1c MFr Bld: 9.1 % — ABNORMAL HIGH (ref 4.6–6.5)

## 2023-07-03 ENCOUNTER — Encounter: Payer: MEDICARE | Attending: Physician Assistant | Primary: Emergency Medicine

## 2023-07-03 DIAGNOSIS — Z5111 Encounter for antineoplastic chemotherapy: Secondary | ICD-10-CM | POA: Diagnosis not present

## 2023-07-04 ENCOUNTER — Ambulatory Visit (HOSPITAL_BASED_OUTPATIENT_CLINIC_OR_DEPARTMENT_OTHER): Payer: Medicare Other | Admitting: Cardiology

## 2023-07-05 ENCOUNTER — Encounter: Payer: Self-pay | Admitting: Nurse Practitioner

## 2023-07-05 ENCOUNTER — Ambulatory Visit: Payer: Medicare Other | Attending: Cardiology | Admitting: Nurse Practitioner

## 2023-07-05 VITALS — BP 100/60 | HR 62 | Ht 72.0 in | Wt 209.0 lb

## 2023-07-05 DIAGNOSIS — I2510 Atherosclerotic heart disease of native coronary artery without angina pectoris: Secondary | ICD-10-CM | POA: Diagnosis not present

## 2023-07-05 DIAGNOSIS — E1159 Type 2 diabetes mellitus with other circulatory complications: Secondary | ICD-10-CM | POA: Diagnosis not present

## 2023-07-05 DIAGNOSIS — I10 Essential (primary) hypertension: Secondary | ICD-10-CM | POA: Diagnosis not present

## 2023-07-05 DIAGNOSIS — R42 Dizziness and giddiness: Secondary | ICD-10-CM | POA: Diagnosis not present

## 2023-07-05 DIAGNOSIS — I951 Orthostatic hypotension: Secondary | ICD-10-CM | POA: Diagnosis not present

## 2023-07-05 DIAGNOSIS — I255 Ischemic cardiomyopathy: Secondary | ICD-10-CM | POA: Diagnosis not present

## 2023-07-05 DIAGNOSIS — Z794 Long term (current) use of insulin: Secondary | ICD-10-CM | POA: Diagnosis not present

## 2023-07-05 MED ORDER — SACUBITRIL-VALSARTAN 24-26 MG PO TABS: 1.0000 | ORAL_TABLET | Freq: Two times a day (BID) | ORAL | 6 refills | Status: DC

## 2023-07-05 NOTE — Progress Notes (Signed)
Office Visit    Patient Name: Russell Trevino Date of Encounter: 07/05/2023  Primary Care Provider:  Pincus Sanes, MD Primary Cardiologist:  Jodelle Red, MD  Chief Complaint    72 year old male with a history of CAD s/p CABG x 4 (LIMA-LAD, left radial-OM/D1, SVG-PDA) in 2006, NSTEMI in 10/2018, ICM with EF 20 to 25%, hypertension, hyperlipidemia, type 2 diabetes and prostate cancer who presents for follow-up related to CAD and orthostatic hypotension.  Past Medical History    Past Medical History:  Diagnosis Date   BPH (benign prostatic hypertrophy)    Cancer (HCC)    prostate   Cardiomyopathy, ischemic    CHF (congestive heart failure) (HCC)    Coronary artery disease    at [facility], [place] 2009 (GRAFTS PATENT), cath 05/2018 w/ SVG-PDA 100%>>med rx   Diabetes mellitus, type 2 (HCC)    Frequency of urination    GERD (gastroesophageal reflux disease)    Gross hematuria    History of CHF (congestive heart failure) 2007   History of kidney stones    History of myocardial infarction 2006, 2019   2006>>CABG, 2019 cath w/ med rx   History of urinary retention    Hyperlipidemia    Hypertension    Myocardial infarction (HCC)    2006   Nocturia    S/P CABG x 4 2006   Past Surgical History:  Procedure Laterality Date   CARDIAC CATHETERIZATION  01/17/2008   GRAFTS PATENT / EF 40%,  DR SANTOS ([facility])   CORONARY ARTERY BYPASS GRAFT  04/2005   LIMA-LAD, L radial-OM-D1 and SVG-PDA   CYSTO / RIGHT URETERAL STENT PLACEMENT  10-08-1999   CYSTO/  REMOVAL BLADDER STONES  01-14-2010   CYSTOSCOPY  10/16/2012   Procedure: CYSTOSCOPY;  Surgeon: Anner Crete, MD;  Location: [facility];  Service: Urology;  Laterality: N/A;  Cystoscopy with Clot Evacuation and fulguration bladder neck.   EXTRACORPOREAL SHOCK WAVE LITHOTRIPSY  10-15-2009   RIGHT   GOLD SEED IMPLANT N/A 10/06/2022   Procedure: GOLD SEED IMPLANT;  Surgeon: Heloise Purpura, MD;  Location: [facility];   Service: Urology;  Laterality: N/A;   KNEE ARTHROSCOPY  1978   RIGHT   LEFT HEART CATH AND CORS/GRAFTS ANGIOGRAPHY N/A 12/17/2021   Procedure: LEFT HEART CATH AND CORS/GRAFTS ANGIOGRAPHY;  Surgeon: Orbie Pyo, MD;  Location: [facility];  Service: Cardiovascular;  Laterality: N/A;   LEFT SHOULDER SURGERY   2002   LUMBAR FUSION  09-10-2008   L4 - L5   PERCUTANEOUS NEPHROSTOLITHOTOMY  1979   PROSTATE BIOPSY  10/16/2012   Procedure: PROSTATE BIOPSY;  Surgeon: Anner Crete, MD;  Location: [facility];  Service: Urology;  Laterality: N/A;  Through ultrasound.   RIGHT/LEFT HEART CATH AND CORONARY/GRAFT ANGIOGRAPHY N/A 10/26/2018   Procedure: RIGHT/LEFT HEART CATH AND CORONARY/GRAFT ANGIOGRAPHY;  Surgeon: [place]az[place], Peter M, MD;  Location: [facility] INVASIVE CV [facility];  Service: Cardiovascular;  Laterality: N/A;   SHOULDER ARTHROSCOPY W/ SUBACROMIAL DECOMPRESSION AND DISTAL CLAVICLE EXCISION  10-11-2004   AND PARTIAL ROTATOR CUFF REPAIR   SPACE OAR INSTILLATION N/A 10/06/2022   Procedure: SPACE OAR INSTILLATION;  Surgeon: Heloise Purpura, MD;  Location: [facility];  Service: Urology;  Laterality: N/A;  30 MINS FOR CASE   TRANSTHORACIC ECHOCARDIOGRAM  01/09/2012   LV SYSTOLIC FUNCTION MILDLY REDUCED/ EF 48%/ LV FILLING PATTERN  IS IMPAIRED/ HYPOKINESIS IN THE MID AND BASALAR SEPTUM & ANTEROSEPTUM   TURP VAPORIZATION  01-19-2010  BPH W/ BOO   URETEROLITHOTOMY  1976    Allergies  Not on File   Labs/Other Studies Reviewed    The following studies were reviewed today:  Cardiac Studies & Procedures   CARDIAC CATHETERIZATION  CARDIAC CATHETERIZATION 12/17/2021  Narrative   Mid LM to Dist LM lesion is 100% stenosed.   Prox LAD lesion is 100% stenosed.   Prox Cx lesion is 100% stenosed.   Prox RCA to Dist RCA lesion is 100% stenosed.   Dist LAD lesion is 60% stenosed.   LV end diastolic pressure is normal.  1.  Patent LIMA to LAD with radial off the LIMA graft to diagonal and obtuse  marginal branches. 2.  Vein graft right coronary artery known occluded and not imaged. 3.  Severe native vessel disease with known occlusion of right coronary artery which was not imaged on the study and newly occluded left main lesion. 4.  LVEDP of 16 mmHg.  The results were reviewed with Dr. Izora Ribas and optimal medical therapy will be pursued.  Findings Coronary Findings Diagnostic  Dominance: Right  Left Main Mid LM to Dist LM lesion is 100% stenosed.  Left Anterior Descending Prox LAD lesion is 100% stenosed. Dist LAD lesion is 60% stenosed.  Left Circumflex Prox Cx lesion is 100% stenosed.  Right Coronary Artery Prox RCA to Dist RCA lesion is 100% stenosed.  Acute Marginal Branch Collaterals Acute Mrg filled by collaterals from Prox RCA.  Right Posterior Descending Artery Collaterals RPDA filled by collaterals from 3rd Sept.  LIMA Graft To Mid LAD  Sequential Graft To 1st Diag, 3rd Mrg  Intervention  No interventions have been documented.   CARDIAC CATHETERIZATION  CARDIAC CATHETERIZATION 10/26/2018  Narrative  Mid LM to Dist LM lesion is 95% stenosed.  Prox LAD lesion is 100% stenosed.  Prox Cx lesion is 100% stenosed.  Prox RCA to Dist RCA lesion is 100% stenosed.  LV end diastolic pressure is normal.  Hemodynamic findings consistent with mild pulmonary hypertension.  1. Occlusive native vessel CAD. -95% distal left main - 100% proximal LAD after the first septal perforator - 100% LCx after a very small OM1. - 100% proximal RCA 2. Patent LIMA to the LAD 3. Patent free radial graft arising from the mid LIMA graft and supplying the first diagonal and OM 4. SVG to RCA is known to be occluded. The distal RCA is supplied by collaterals. 5. Normal LV filling pressures 6. Mild pulmonary HTN. 7. Preserved cardiac output.  Plan: continue medical therapy for CHF. Will switch IV lasix to po. Stop IV Ntg and heparin.  Findings Coronary  Findings Diagnostic  Dominance: Right  Left Main Mid LM to Dist LM lesion is 95% stenosed.  Left Anterior Descending Prox LAD lesion is 100% stenosed.  Left Circumflex Prox Cx lesion is 100% stenosed.  Right Coronary Artery Prox RCA to Dist RCA lesion is 100% stenosed.  Acute Marginal Branch Collaterals Acute Mrg filled by collaterals from Prox RCA.  Right Posterior Descending Artery Collaterals RPDA filled by collaterals from 3rd Sept.  LIMA Graft To Mid LAD  Sequential Graft To 1st Diag, 3rd Mrg  Intervention  No interventions have been documented.     ECHOCARDIOGRAM  ECHOCARDIOGRAM COMPLETE 12/17/2021  Narrative ECHOCARDIOGRAM REPORT    Patient Name:   TANE CHOY Date of Exam: 12/17/2021 Medical Rec #:  [id number]       Height:       72.0 in Accession #:    [id number]  Weight:       235.0 lb Date of Birth:  Feb 28, 1951      BSA:          2.282 m Patient Age:    81 years        BP:           144/71 mmHg Patient Gender: M               HR:           73 bpm. Exam Location:  Inpatient  Procedure: 2D Echo and Intracardiac Opacification Agent  Indications:    Chest pain  History:        Patient has prior history of Echocardiogram examinations, most recent 10/26/2018. Cardiomyopathy and CHF, CAD and Previous Myocardial Infarction; Risk Factors:Diabetes.  Sonographer:    Devonne Doughty Referring Phys: [phone] [facility]   Sonographer Comments: Image acquisition challenging due to patient body habitus. IMPRESSIONS   1. Left ventricular ejection fraction, by estimation, is 20 to 25%. The left ventricle has severely decreased function. The left ventricle demonstrates regional wall motion abnormalities (see scoring diagram/findings for description). The left ventricular internal cavity size was moderately dilated. Left ventricular diastolic parameters are indeterminate. 2. Right ventricular systolic function is moderately reduced. The right ventricular  size is normal. 3. Left atrial size was mildly dilated. 4. The mitral valve is grossly normal. Trivial mitral valve regurgitation. No evidence of mitral stenosis. 5. The aortic valve was not well visualized. Aortic valve regurgitation is not visualized. No aortic stenosis is present. 6. Aortic dilatation noted. There is mild dilatation of the ascending aorta, measuring 41 mm.  Comparison(s): Changes from prior study are noted.  Conclusion(s)/Recommendation(s): Wall motion better seen on current study. Diffuse hypokinesis with akinesis predominantly in the inferoseptal and inferior walls. No evidence of LV thrombus. Severely reduced LVEF, 20-25%.  FINDINGS Left Ventricle: Left ventricular ejection fraction, by estimation, is 20 to 25%. The left ventricle has severely decreased function. The left ventricle demonstrates regional wall motion abnormalities. Definity contrast agent was given IV to delineate the left ventricular endocardial borders. The left ventricular internal cavity size was moderately dilated. There is no left ventricular hypertrophy. Left ventricular diastolic parameters are indeterminate.   LV Wall Scoring: The mid and distal anterior septum, inferior septum, entire inferior wall, and apex are akinetic. The entire anterior wall, entire lateral wall, and basal anteroseptal segment are hypokinetic.  Right Ventricle: The right ventricular size is normal. Right vetricular wall thickness was not well visualized. Right ventricular systolic function is moderately reduced.  Left Atrium: Left atrial size was mildly dilated.  Right Atrium: Right atrial size was normal in size.  Pericardium: There is no evidence of pericardial effusion.  Mitral Valve: The mitral valve is grossly normal. Trivial mitral valve regurgitation. No evidence of mitral valve stenosis.  Tricuspid Valve: The tricuspid valve is grossly normal. Tricuspid valve regurgitation is trivial.  Aortic Valve: The  aortic valve was not well visualized. Aortic valve regurgitation is not visualized. No aortic stenosis is present. Aortic valve mean gradient measures 2.0 mmHg. Aortic valve peak gradient measures 3.8 mmHg. Aortic valve area, by VTI measures 2.97 cm.  Pulmonic Valve: The pulmonic valve was not well visualized. Pulmonic valve regurgitation is trivial. No evidence of pulmonic stenosis.  Aorta: Aortic dilatation noted. There is mild dilatation of the ascending aorta, measuring 41 mm.  IAS/Shunts: The interatrial septum was not well visualized.   LEFT VENTRICLE PLAX 2D LVIDd:  5.90 cm   Diastology LVIDs:         5.30 cm   LV e' medial:    4.90 cm/s LV PW:         0.90 cm   LV E/e' medial:  11.1 LV IVS:        0.90 cm   LV e' lateral:   6.85 cm/s LVOT diam:     2.30 cm   LV E/e' lateral: 7.9 LV SV:         53 LV SV Index:   23 LVOT Area:     4.15 cm   RIGHT VENTRICLE RV Basal diam:  3.40 cm RV Mid diam:    2.50 cm RV S prime:     6.74 cm/s TAPSE (M-mode): 1.1 cm  LEFT ATRIUM             Index        RIGHT ATRIUM           Index LA diam:        4.10 cm 1.80 cm/m   RA Area:     11.50 cm LA Vol (A2C):   44.7 ml 19.59 ml/m  RA Volume:   25.20 ml  11.04 ml/m LA Vol (A4C):   53.1 ml 23.27 ml/m LA Biplane Vol: 49.8 ml 21.83 ml/m AORTIC VALVE AV Area (Vmax):    2.89 cm AV Area (Vmean):   2.89 cm AV Area (VTI):     2.97 cm AV Vmax:           97.70 cm/s AV Vmean:          64.600 cm/s AV VTI:            0.179 m AV Peak Grad:      3.8 mmHg AV Mean Grad:      2.0 mmHg LVOT Vmax:         67.90 cm/s LVOT Vmean:        44.900 cm/s LVOT VTI:          0.128 m LVOT/AV VTI ratio: 0.72  AORTA Ao Root diam: 3.40 cm Ao Asc diam:  4.10 cm  MITRAL VALVE MV Area (PHT): 3.93 cm    SHUNTS MV Decel Time: 193 msec    Systemic VTI:  0.13 m MV E velocity: 54.40 cm/s  Systemic Diam: 2.30 cm MV A velocity: 96.40 cm/s MV E/A ratio:  0.56  Jodelle Red MD Electronically  signed by Jodelle Red MD Signature Date/Time: 12/17/2021/10:50:26 AM    Final            Recent Labs: 06/28/2023: ALT 18; BUN 29; Creatinine, Ser 1.82; Hemoglobin 11.4; Platelets 130.0; Potassium 4.8; Sodium 133; TSH 1.65  Recent Lipid Panel    Component Value Date/Time   CHOL 152 06/28/2023 1440   TRIG 193.0 (H) 06/28/2023 1440   HDL 53.10 06/28/2023 1440   CHOLHDL 3 06/28/2023 1440   VLDL 38.6 06/28/2023 1440   LDLCALC 60 06/28/2023 1440   LDLDIRECT 65.0 01/30/2023 1153    History of Present Illness   72 year old male with the above past medical history including  CAD s/p CABG x 4 (LIMA-LAD, left radial-OM/D1, SVG-PDA) in 2006, NSTEMI in 10/2018, ICM with EF 20 to 25%, hypertension, hyperlipidemia, type 2 diabetes and prostate cancer.   He has a history of NSTEMI in 2006 that led to CABG x 4 as above.  He was noted to have ischemic cardiomyopathy with EF 40 to 45%.  He was hospitalized  in 10/2018 in the setting of NSTEMI, heart failure symptoms.  Cath showed severe native CAD, no intervenable targets.  Medical management was advised.  Echocardiogram in 12/2021 showed EF 25%, septal and inferior wall motion abnormalities.  Repeat cardiac catheterization showed native left main occlusion with patent arterial graft.  He was diagnosed with prostate cancer.  He was last seen in the office on 11/24/2022 and reported significant lightheadedness. He saw his PCP on 06/28/2023 and noted lightheadedness with standing and walking.  This was felt to likely be in the setting of orthostatic hypotension due to overmedication in the setting of recent weight loss (orthostatics were positive at office visit).  Imdur was decreased to 120 mg once daily.   He presents today for follow-up.  Since his last visit he has continued to feel poorly.  He notes significant lightheadedness upon standing and with walking.  He denies any presyncope or syncope.  He notes generalized weakness, fatigue.  He has lost  over 40 pounds and has noticed lower BP readings. He had orthostatic vital signs taken at his primary care office. Per pt his SBP dropped from 120-90 mmHg. His Imdur was decreased from 120 mg twice daily to 120 mg daily.  His BP remains low.   Home Medications    Current Outpatient Medications  Medication Sig Dispense Refill   clopidogrel (PLAVIX) 75 MG tablet Take 75 mg by mouth daily.     ezetimibe (ZETIA) 10 MG tablet Take 10 mg by mouth daily.     famotidine (PEPCID) 40 MG tablet Take 40 mg by mouth at bedtime.     finasteride (PROSCAR) 5 MG tablet TAKE 1 TABLET(5 MG) BY MOUTH DAILY (Patient taking differently: Take 5 mg by mouth at bedtime.) 90 tablet 1   Insulin Pen Needle 31G X 6 MM MISC UAD for insulin pen 60 each 5   isosorbide mononitrate (IMDUR) 120 MG 24 hr tablet Take 1 tablet (120 mg total) by mouth daily.     nitroGLYCERIN (NITROSTAT) 0.4 MG SL tablet PLACE 1 TABLET UNDER THE TONGUE EVERY 5 MINS FOR CHEST FOR PAIN 100 tablet 0   ranolazine (RANEXA) 500 MG 12 hr tablet Take 500 mg by mouth 2 (two) times daily.     rosuvastatin (CRESTOR) 40 MG tablet Take 1 tablet (40 mg total) by mouth daily. 90 tablet 3   sacubitril-valsartan (ENTRESTO) 24-26 MG Take 1 tablet by mouth 2 (two) times daily. 60 tablet 6   Semaglutide, 2 MG/DOSE, 8 MG/3ML SOPN Inject 2 mg into the skin once a week.     amoxicillin (AMOXIL) 500 MG tablet Take 500 mg by mouth every 8 (eight) hours. (Patient not taking: Reported on 07/05/2023)     cyclobenzaprine (FLEXERIL) 10 MG tablet Take 1 tablet by mouth at bedtime. (Patient not taking: Reported on 07/05/2023)     eszopiclone (LUNESTA) 2 MG TABS tablet SMARTSIG:1 Tablet(s) By Mouth Every Evening (Patient not taking: Reported on 07/05/2023)     insulin glargine (LANTUS SOLOSTAR) 100 UNIT/ML Solostar Pen Inject 30 Units into the skin 2 (two) times daily. (Patient not taking: Reported on 07/05/2023) 30 mL 5   Insulin Syringe-Needle U-100 (BD VEO INSULIN SYRINGE U/F)  31G X 15/64" 1 ML MISC use as directed TO INJECT INSULIN  twice a day (Patient not taking: Reported on 07/05/2023) 10 each 2   metoprolol succinate (TOPROL-XL) 50 MG 24 hr tablet Take 1 tablet (50 mg total) by mouth daily at 12 noon. Take with or immediately following  a meal. (Patient not taking: Reported on 07/05/2023) 90 tablet 3   ondansetron (ZOFRAN-ODT) 8 MG disintegrating tablet Take 1 tablet (8 mg total) by mouth every 8 (eight) hours as needed for nausea or vomiting. (Patient not taking: Reported on 07/05/2023) 20 tablet 1   pantoprazole (PROTONIX) 40 MG tablet Take 40 mg by mouth daily. (Patient not taking: Reported on 07/05/2023)     No current facility-administered medications for this visit.     Review of Systems    He denies chest pain, palpitations, dyspnea, pnd, orthopnea, n, v, syncope, edema, weight gain, or early satiety. All other systems reviewed and are otherwise negative except as noted above.   Physical Exam    VS:  BP 100/60 (BP Location: Left Arm, Patient Position: Sitting, Cuff Size: Normal)   Pulse 62   Ht 6' (1.829 m)   Wt 209 lb (94.8 kg)   SpO2 96%   BMI 28.35 kg/m  , BMI Body mass index is 28.35 kg/m. STOP-Bang Score:         GEN: Well nourished, well developed, in no acute distress. HEENT: normal. Neck: Supple, no JVD, carotid bruits, or masses. Cardiac: RRR, no murmurs, rubs, or gallops. No clubbing, cyanosis, edema.  Radials/DP/PT 2+ and equal bilaterally.  Respiratory:  Respirations regular and unlabored, clear to auscultation bilaterally. GI: Soft, nontender, nondistended, BS + x 4. MS: no deformity or atrophy. Skin: warm and dry, no rash. Neuro:  Strength and sensation are intact. Psych: Normal affect.  Accessory Clinical Findings    ECG personally reviewed by me today - EKG Interpretation Date/Time:  Wednesday July 05 2023 15:04:12 EDT Ventricular Rate:  62 PR Interval:  168 QRS Duration:  118 QT Interval:  444 QTC Calculation: 450 R  Axis:   -5  Text Interpretation: Normal sinus rhythm Minimal voltage criteria for LVH, may be normal variant ( Cornell product ) ST & T wave abnormality, consider lateral ischemia When compared with ECG of 17-Dec-2021 06:13, No significant change was found Confirmed by Bernadene Person ([id number]) on 07/05/2023 3:17:15 PM  - no acute changes.   Lab Results  Component Value Date   WBC 5.7 06/28/2023   HGB 11.4 (L) 06/28/2023   HCT 33.8 (L) 06/28/2023   MCV 89.9 06/28/2023   PLT 130.0 (L) 06/28/2023   Lab Results  Component Value Date   CREATININE 1.82 (H) 06/28/2023   BUN 29 (H) 06/28/2023   NA 133 (L) 06/28/2023   K 4.8 06/28/2023   CL 101 06/28/2023   CO2 24 06/28/2023   Lab Results  Component Value Date   ALT 18 06/28/2023   AST 14 06/28/2023   ALKPHOS 68 06/28/2023   BILITOT 1.5 (H) 06/28/2023   Lab Results  Component Value Date   CHOL 152 06/28/2023   HDL 53.10 06/28/2023   LDLCALC 60 06/28/2023   LDLDIRECT 65.0 01/30/2023   TRIG 193.0 (H) 06/28/2023   CHOLHDL 3 06/28/2023    Lab Results  Component Value Date   HGBA1C 9.1 (H) 06/28/2023    Assessment & Plan    1. Lightheadedness/orthostatic hypotension/hypertension: Recent lightheadedness in the setting of orthostatic hypotension.  He denies any presyncope or syncope.  Denies palpitations, chest pain, dyspnea.  He does note generalized weakness, fatigue.  BP has remained low despite decreased Imdur dosing.  Will decrease Entresto to 24-26 mg twice daily.  Continue to monitor BP report SBP consistently less than 110.  Encouraged adequate hydration, gradual position changes, discussed possible compression (abdominal binder  or thigh sleeves).  If BP remains low, consider further de-escalation of antihypertensive regimen.  2. CAD: S/p CABG x 4 (LIMA-LAD, left radial-OM/D1, SVG-PDA) in 2006, NSTEMI in 10/2018. Stable with no anginal symptoms. No indication for ischemic evaluation.  Continue metoprolol, Entresto as above, Imdur,  Ranexa, Crestor, and Zetia.  3. ICM: Echocardiogram in 12/2021 showed EF 25%, septal and inferior wall motion abnormalities. Euvolemic and well compensated on exam.  Will decrease Entresto as above in the setting of orthostatic hypotension.  Will repeat echocardiogram.  If EF remains decreased, consider need for ICD.  Could consider SGLT2 inhibitor once diabetes is better controlled, will defer for now.   4. Hyperlipidemia: LDL was 60 in 06/2023.  Continue Crestor, Zetia.  5. Type 2 diabetes: A1c was 9.1 in 06/2023. Monitored and managed per PCP.   6. CKD stage III: Creatinine was stable at 1.82 in 06/2023.   7. Prostate cancer: Following with urology/oncology.  8. Disposition: Follow-up in 6 weeks with APP, follow-up as scheduled with Dr. Cristal Deer in 09/2023.  Joylene Grapes, NP 07/05/2023, 5:00 PM

## 2023-07-05 NOTE — Patient Instructions (Addendum)
Medication Instructions:  Decrease Entresto 24/26 mg twice daily  *If you need a refill on your cardiac medications before your next appointment, please call your pharmacy*   Lab Work: NONE ordered at this time of appointment     Testing/Procedures: Your physician has requested that you have an echocardiogram. Echocardiography is a painless test that uses sound waves to create images of your heart. It provides your doctor with information about the size and shape of your heart and how well your heart's chambers and valves are working. This procedure takes approximately one hour. There are no restrictions for this procedure. Please do NOT wear cologne, perfume, aftershave, or lotions (deodorant is allowed). Please arrive 15 minutes prior to your appointment time.    Follow-Up: At [facility], you and your health needs are our priority.  As part of our continuing mission to provide you with exceptional heart care, we have created designated Provider Care Teams.  These Care Teams include your primary Cardiologist (physician) and Advanced Practice Providers (APPs -  Physician Assistants and Nurse Practitioners) who all work together to provide you with the care you need, when you need it.  We recommend signing up for the patient portal called "MyChart".  Sign up information is provided on this After Visit Summary.  MyChart is used to connect with patients for Virtual Visits (Telemedicine).  Patients are able to view lab/test results, encounter notes, upcoming appointments, etc.  Non-urgent messages can be sent to your provider as well.   To learn more about what you can do with MyChart, go to [email or web address].    Your next appointment:   6 week(s)   Provider:   Bernadene Person, NP        Other Instructions Monitor Blood pressure. Report systolic BP (top number) consistently less than 110.

## 2023-07-24 ENCOUNTER — Other Ambulatory Visit: Payer: Self-pay | Admitting: Internal Medicine

## 2023-07-24 DIAGNOSIS — I25119 Atherosclerotic heart disease of native coronary artery with unspecified angina pectoris: Secondary | ICD-10-CM

## 2023-08-04 DIAGNOSIS — R739 Hyperglycemia, unspecified: Secondary | ICD-10-CM | POA: Diagnosis not present

## 2023-08-04 DIAGNOSIS — K5909 Other constipation: Secondary | ICD-10-CM | POA: Diagnosis not present

## 2023-08-04 DIAGNOSIS — K3184 Gastroparesis: Secondary | ICD-10-CM | POA: Diagnosis not present

## 2023-08-04 DIAGNOSIS — Z860101 Personal history of adenomatous and serrated colon polyps: Secondary | ICD-10-CM | POA: Diagnosis not present

## 2023-08-07 ENCOUNTER — Other Ambulatory Visit (HOSPITAL_COMMUNITY): Payer: Medicare Other

## 2023-08-07 ENCOUNTER — Encounter (HOSPITAL_COMMUNITY): Payer: Self-pay | Admitting: Nurse Practitioner

## 2023-08-17 ENCOUNTER — Ambulatory Visit: Payer: Medicare Other | Attending: Nurse Practitioner | Admitting: Nurse Practitioner

## 2023-08-17 NOTE — Progress Notes (Unsigned)
Office Visit    Patient Name: Russell Trevino Date of Encounter: 08/17/2023  Primary Care Provider:  Pincus Sanes, MD Primary Cardiologist:  Jodelle Red, MD  Chief Complaint    72 year old male with a history of CAD s/p CABG x 4 (LIMA-LAD, left radial-OM/D1, SVG-PDA) in 2006, NSTEMI in 10/2018, ICM with EF 20 to 25%, hypertension, hyperlipidemia, type 2 diabetes and prostate cancer who presents for follow-up related to CAD and orthostatic hypotension.   Past Medical History    Past Medical History:  Diagnosis Date   BPH (benign prostatic hypertrophy)    Cancer (HCC)    prostate   Cardiomyopathy, ischemic    CHF (congestive heart failure) (HCC)    Coronary artery disease    at [facility], [place] 2009 (GRAFTS PATENT), cath 05/2018 w/ SVG-PDA 100%>>med rx   Diabetes mellitus, type 2 (HCC)    Frequency of urination    GERD (gastroesophageal reflux disease)    Gross hematuria    History of CHF (congestive heart failure) 2007   History of kidney stones    History of myocardial infarction 2006, 2019   2006>>CABG, 2019 cath w/ med rx   History of urinary retention    Hyperlipidemia    Hypertension    Myocardial infarction (HCC)    2006   Nocturia    S/P CABG x 4 2006   Past Surgical History:  Procedure Laterality Date   CARDIAC CATHETERIZATION  01/17/2008   GRAFTS PATENT / EF 40%,  DR SANTOS ([facility])   CORONARY ARTERY BYPASS GRAFT  04/2005   LIMA-LAD, L radial-OM-D1 and SVG-PDA   CYSTO / RIGHT URETERAL STENT PLACEMENT  10-08-1999   CYSTO/  REMOVAL BLADDER STONES  01-14-2010   CYSTOSCOPY  10/16/2012   Procedure: CYSTOSCOPY;  Surgeon: Anner Crete, MD;  Location: [facility];  Service: Urology;  Laterality: N/A;  Cystoscopy with Clot Evacuation and fulguration bladder neck.   EXTRACORPOREAL SHOCK WAVE LITHOTRIPSY  10-15-2009   RIGHT   GOLD SEED IMPLANT N/A 10/06/2022   Procedure: GOLD SEED IMPLANT;  Surgeon: Heloise Purpura, MD;  Location: [facility];   Service: Urology;  Laterality: N/A;   KNEE ARTHROSCOPY  1978   RIGHT   LEFT HEART CATH AND CORS/GRAFTS ANGIOGRAPHY N/A 12/17/2021   Procedure: LEFT HEART CATH AND CORS/GRAFTS ANGIOGRAPHY;  Surgeon: Orbie Pyo, MD;  Location: [facility];  Service: Cardiovascular;  Laterality: N/A;   LEFT SHOULDER SURGERY   2002   LUMBAR FUSION  09-10-2008   L4 - L5   PERCUTANEOUS NEPHROSTOLITHOTOMY  1979   PROSTATE BIOPSY  10/16/2012   Procedure: PROSTATE BIOPSY;  Surgeon: Anner Crete, MD;  Location: [facility];  Service: Urology;  Laterality: N/A;  Through ultrasound.   RIGHT/LEFT HEART CATH AND CORONARY/GRAFT ANGIOGRAPHY N/A 10/26/2018   Procedure: RIGHT/LEFT HEART CATH AND CORONARY/GRAFT ANGIOGRAPHY;  Surgeon: Swazil[place], Peter M, MD;  Location: [facility] INVASIVE CV LAB;  Service: Cardiovascular;  Laterality: N/A;   SHOULDER ARTHROSCOPY W/ SUBACROMIAL DECOMPRESSION AND DISTAL CLAVICLE EXCISION  10-11-2004   AND PARTIAL ROTATOR CUFF REPAIR   SPACE OAR INSTILLATION N/A 10/06/2022   Procedure: SPACE OAR INSTILLATION;  Surgeon: Heloise Purpura, MD;  Location: [facility];  Service: Urology;  Laterality: N/A;  30 MINS FOR CASE   TRANSTHORACIC ECHOCARDIOGRAM  01/09/2012   LV SYSTOLIC FUNCTION MILDLY REDUCED/ EF 48%/ LV FILLING PATTERN  IS IMPAIRED/ HYPOKINESIS IN THE MID AND BASALAR SEPTUM & ANTEROSEPTUM   TURP VAPORIZATION  01-19-2010  BPH W/ BOO   URETEROLITHOTOMY  1976    Allergies  Not on File   Labs/Other Studies Reviewed    The following studies were reviewed today:  Cardiac Studies & Procedures   CARDIAC CATHETERIZATION  CARDIAC CATHETERIZATION 12/17/2021  Narrative   Mid LM to Dist LM lesion is 100% stenosed.   Prox LAD lesion is 100% stenosed.   Prox Cx lesion is 100% stenosed.   Prox RCA to Dist RCA lesion is 100% stenosed.   Dist LAD lesion is 60% stenosed.   LV end diastolic pressure is normal.  1.  Patent LIMA to LAD with radial off the LIMA graft to diagonal and obtuse  marginal branches. 2.  Vein graft right coronary artery known occluded and not imaged. 3.  Severe native vessel disease with known occlusion of right coronary artery which was not imaged on the study and newly occluded left main lesion. 4.  LVEDP of 16 mmHg.  The results were reviewed with Dr. Izora Ribas and optimal medical therapy will be pursued.  Findings Coronary Findings Diagnostic  Dominance: Right  Left Main Mid LM to Dist LM lesion is 100% stenosed.  Left Anterior Descending Prox LAD lesion is 100% stenosed. Dist LAD lesion is 60% stenosed.  Left Circumflex Prox Cx lesion is 100% stenosed.  Right Coronary Artery Prox RCA to Dist RCA lesion is 100% stenosed.  Acute Marginal Branch Collaterals Acute Mrg filled by collaterals from Prox RCA.  Right Posterior Descending Artery Collaterals RPDA filled by collaterals from 3rd Sept.  LIMA Graft To Mid LAD  Sequential Graft To 1st Diag, 3rd Mrg  Intervention  No interventions have been documented.   CARDIAC CATHETERIZATION  CARDIAC CATHETERIZATION 10/26/2018  Narrative  Mid LM to Dist LM lesion is 95% stenosed.  Prox LAD lesion is 100% stenosed.  Prox Cx lesion is 100% stenosed.  Prox RCA to Dist RCA lesion is 100% stenosed.  LV end diastolic pressure is normal.  Hemodynamic findings consistent with mild pulmonary hypertension.  1. Occlusive native vessel CAD. -95% distal left main - 100% proximal LAD after the first septal perforator - 100% LCx after a very small OM1. - 100% proximal RCA 2. Patent LIMA to the LAD 3. Patent free radial graft arising from the mid LIMA graft and supplying the first diagonal and OM 4. SVG to RCA is known to be occluded. The distal RCA is supplied by collaterals. 5. Normal LV filling pressures 6. Mild pulmonary HTN. 7. Preserved cardiac output.  Plan: continue medical therapy for CHF. Will switch IV lasix to po. Stop IV Ntg and heparin.  Findings Coronary  Findings Diagnostic  Dominance: Right  Left Main Mid LM to Dist LM lesion is 95% stenosed.  Left Anterior Descending Prox LAD lesion is 100% stenosed.  Left Circumflex Prox Cx lesion is 100% stenosed.  Right Coronary Artery Prox RCA to Dist RCA lesion is 100% stenosed.  Acute Marginal Branch Collaterals Acute Mrg filled by collaterals from Prox RCA.  Right Posterior Descending Artery Collaterals RPDA filled by collaterals from 3rd Sept.  LIMA Graft To Mid LAD  Sequential Graft To 1st Diag, 3rd Mrg  Intervention  No interventions have been documented.     ECHOCARDIOGRAM  ECHOCARDIOGRAM COMPLETE 12/17/2021  Narrative ECHOCARDIOGRAM REPORT    Patient Name:   ALANMICHAEL SPINDEL Date of Exam: 12/17/2021 Medical Rec #:  [id number]       Height:       72.0 in Accession #:    [id number]  Weight:       235.0 lb Date of Birth:  10-Sep-1951      BSA:          2.282 m Patient Age:    11 years        BP:           144/71 mmHg Patient Gender: M               HR:           73 bpm. Exam Location:  Inpatient  Procedure: 2D Echo and Intracardiac Opacification Agent  Indications:    Chest pain  History:        Patient has prior history of Echocardiogram examinations, most recent 10/26/2018. Cardiomyopathy and CHF, CAD and Previous Myocardial Infarction; Risk Factors:Diabetes.  Sonographer:    Devonne Doughty Referring Phys: [phone] [facility]   Sonographer Comments: Image acquisition challenging due to patient body habitus. IMPRESSIONS   1. Left ventricular ejection fraction, by estimation, is 20 to 25%. The left ventricle has severely decreased function. The left ventricle demonstrates regional wall motion abnormalities (see scoring diagram/findings for description). The left ventricular internal cavity size was moderately dilated. Left ventricular diastolic parameters are indeterminate. 2. Right ventricular systolic function is moderately reduced. The right ventricular  size is normal. 3. Left atrial size was mildly dilated. 4. The mitral valve is grossly normal. Trivial mitral valve regurgitation. No evidence of mitral stenosis. 5. The aortic valve was not well visualized. Aortic valve regurgitation is not visualized. No aortic stenosis is present. 6. Aortic dilatation noted. There is mild dilatation of the ascending aorta, measuring 41 mm.  Comparison(s): Changes from prior study are noted.  Conclusion(s)/Recommendation(s): Wall motion better seen on current study. Diffuse hypokinesis with akinesis predominantly in the inferoseptal and inferior walls. No evidence of LV thrombus. Severely reduced LVEF, 20-25%.  FINDINGS Left Ventricle: Left ventricular ejection fraction, by estimation, is 20 to 25%. The left ventricle has severely decreased function. The left ventricle demonstrates regional wall motion abnormalities. Definity contrast agent was given IV to delineate the left ventricular endocardial borders. The left ventricular internal cavity size was moderately dilated. There is no left ventricular hypertrophy. Left ventricular diastolic parameters are indeterminate.   LV Wall Scoring: The mid and distal anterior septum, inferior septum, entire inferior wall, and apex are akinetic. The entire anterior wall, entire lateral wall, and basal anteroseptal segment are hypokinetic.  Right Ventricle: The right ventricular size is normal. Right vetricular wall thickness was not well visualized. Right ventricular systolic function is moderately reduced.  Left Atrium: Left atrial size was mildly dilated.  Right Atrium: Right atrial size was normal in size.  Pericardium: There is no evidence of pericardial effusion.  Mitral Valve: The mitral valve is grossly normal. Trivial mitral valve regurgitation. No evidence of mitral valve stenosis.  Tricuspid Valve: The tricuspid valve is grossly normal. Tricuspid valve regurgitation is trivial.  Aortic Valve: The  aortic valve was not well visualized. Aortic valve regurgitation is not visualized. No aortic stenosis is present. Aortic valve mean gradient measures 2.0 mmHg. Aortic valve peak gradient measures 3.8 mmHg. Aortic valve area, by VTI measures 2.97 cm.  Pulmonic Valve: The pulmonic valve was not well visualized. Pulmonic valve regurgitation is trivial. No evidence of pulmonic stenosis.  Aorta: Aortic dilatation noted. There is mild dilatation of the ascending aorta, measuring 41 mm.  IAS/Shunts: The interatrial septum was not well visualized.   LEFT VENTRICLE PLAX 2D LVIDd:  5.90 cm   Diastology LVIDs:         5.30 cm   LV e' medial:    4.90 cm/s LV PW:         0.90 cm   LV E/e' medial:  11.1 LV IVS:        0.90 cm   LV e' lateral:   6.85 cm/s LVOT diam:     2.30 cm   LV E/e' lateral: 7.9 LV SV:         53 LV SV Index:   23 LVOT Area:     4.15 cm   RIGHT VENTRICLE RV Basal diam:  3.40 cm RV Mid diam:    2.50 cm RV S prime:     6.74 cm/s TAPSE (M-mode): 1.1 cm  LEFT ATRIUM             Index        RIGHT ATRIUM           Index LA diam:        4.10 cm 1.80 cm/m   RA Area:     11.50 cm LA Vol (A2C):   44.7 ml 19.59 ml/m  RA Volume:   25.20 ml  11.04 ml/m LA Vol (A4C):   53.1 ml 23.27 ml/m LA Biplane Vol: 49.8 ml 21.83 ml/m AORTIC VALVE AV Area (Vmax):    2.89 cm AV Area (Vmean):   2.89 cm AV Area (VTI):     2.97 cm AV Vmax:           97.70 cm/s AV Vmean:          64.600 cm/s AV VTI:            0.179 m AV Peak Grad:      3.8 mmHg AV Mean Grad:      2.0 mmHg LVOT Vmax:         67.90 cm/s LVOT Vmean:        44.900 cm/s LVOT VTI:          0.128 m LVOT/AV VTI ratio: 0.72  AORTA Ao Root diam: 3.40 cm Ao Asc diam:  4.10 cm  MITRAL VALVE MV Area (PHT): 3.93 cm    SHUNTS MV Decel Time: 193 msec    Systemic VTI:  0.13 m MV E velocity: 54.40 cm/s  Systemic Diam: 2.30 cm MV A velocity: 96.40 cm/s MV E/A ratio:  0.56  Jodelle Red MD Electronically  signed by Jodelle Red MD Signature Date/Time: 12/17/2021/10:50:26 AM    Final            Recent Labs: 06/28/2023: ALT 18; BUN 29; Creatinine, Ser 1.82; Hemoglobin 11.4; Platelets 130.0; Potassium 4.8; Sodium 133; TSH 1.65  Recent Lipid Panel    Component Value Date/Time   CHOL 152 06/28/2023 1440   TRIG 193.0 (H) 06/28/2023 1440   HDL 53.10 06/28/2023 1440   CHOLHDL 3 06/28/2023 1440   VLDL 38.6 06/28/2023 1440   LDLCALC 60 06/28/2023 1440   LDLDIRECT 65.0 01/30/2023 1153    History of Present Illness    72 year old male with the above past medical history including  CAD s/p CABG x 4 (LIMA-LAD, left radial-OM/D1, SVG-PDA) in 2006, NSTEMI in 10/2018, ICM with EF 20 to 25%, hypertension, hyperlipidemia, type 2 diabetes and prostate cancer.    He has a history of NSTEMI in 2006 that led to CABG x 4 as above.  He was noted to have ischemic cardiomyopathy with EF 40 to 45%.  He  was hospitalized in 10/2018 in the setting of NSTEMI, heart failure symptoms.  Cath showed severe native CAD, no intervenable targets.  Medical management was advised.  Echocardiogram in 12/2021 showed EF 25%, septal and inferior wall motion abnormalities.  Repeat cardiac catheterization showed native left main occlusion with patent arterial graft.  He was diagnosed with prostate cancer.  He saw his PCP on 06/28/2023 and noted lightheadedness with standing and walking.  This was felt to likely be in the setting of orthostatic hypotension due to overmedication in the setting of recent weight loss (orthostatics were positive at office visit).  Imdur was decreased to 120 mg once daily. He was last seen in the office on 07/05/2023 and reported significant lightheadedness, generalized weakness and fatigue.  Entresto was decreased to 24-26 mg twice daily.  Repeat echocardiogram was ordered.   He presents today for follow-up.  Since his last visit he has  1. Lightheadedness/orthostatic hypotension/hypertension: Recent  lightheadedness in the setting of orthostatic hypotension.  He denies any presyncope or syncope.  Denies palpitations, chest pain, dyspnea.  He does note generalized weakness, fatigue.  BP has remained low despite decreased Imdur dosing.  Will decrease Entresto to 24-26 mg twice daily.  Continue to monitor BP report SBP consistently less than 110.  Encouraged adequate hydration, gradual position changes, discussed possible compression (abdominal binder or thigh sleeves).  If BP remains low, consider further de-escalation of antihypertensive regimen.   2. CAD: S/p CABG x 4 (LIMA-LAD, left radial-OM/D1, SVG-PDA) in 2006, NSTEMI in 10/2018. Stable with no anginal symptoms. No indication for ischemic evaluation.  Continue metoprolol, Entresto as above, Imdur, Ranexa, Crestor, and Zetia.   3. ICM: Echocardiogram in 12/2021 showed EF 25%, septal and inferior wall motion abnormalities. Euvolemic and well compensated on exam.  Will decrease Entresto as above in the setting of orthostatic hypotension.  Will repeat echocardiogram.  If EF remains decreased, consider need for ICD.  Could consider SGLT2 inhibitor once diabetes is better controlled, will defer for now.     4. Hyperlipidemia: LDL was 60 in 06/2023.  Continue Crestor, Zetia.   5. Type 2 diabetes: A1c was 9.1 in 06/2023. Monitored and managed per PCP.    6. CKD stage III: Creatinine was stable at 1.82 in 06/2023.    7. Prostate cancer: Following with urology/oncology.   8. Disposition: Follow-up in 6 weeks with APP, follow-up as scheduled with Dr. Cristal Deer in 09/2023.  Home Medications    Current Outpatient Medications  Medication Sig Dispense Refill   amoxicillin (AMOXIL) 500 MG tablet Take 500 mg by mouth every 8 (eight) hours. (Patient not taking: Reported on 07/05/2023)     clopidogrel (PLAVIX) 75 MG tablet Take 75 mg by mouth daily.     cyclobenzaprine (FLEXERIL) 10 MG tablet Take 1 tablet by mouth at bedtime. (Patient not taking:  Reported on 07/05/2023)     eszopiclone (LUNESTA) 2 MG TABS tablet SMARTSIG:1 Tablet(s) By Mouth Every Evening (Patient not taking: Reported on 07/05/2023)     ezetimibe (ZETIA) 10 MG tablet Take 10 mg by mouth daily.     famotidine (PEPCID) 40 MG tablet Take 40 mg by mouth at bedtime.     finasteride (PROSCAR) 5 MG tablet TAKE 1 TABLET(5 MG) BY MOUTH DAILY (Patient taking differently: Take 5 mg by mouth at bedtime.) 90 tablet 1   insulin glargine (LANTUS SOLOSTAR) 100 UNIT/ML Solostar Pen Inject 30 Units into the skin 2 (two) times daily. (Patient not taking: Reported on 07/05/2023) 30 mL 5  Insulin Pen Needle 31G X 6 MM MISC UAD for insulin pen 60 each 5   Insulin Syringe-Needle U-100 (BD VEO INSULIN SYRINGE U/F) 31G X 15/64" 1 ML MISC use as directed TO INJECT INSULIN  twice a day (Patient not taking: Reported on 07/05/2023) 10 each 2   isosorbide mononitrate (IMDUR) 120 MG 24 hr tablet Take 1 tablet (120 mg total) by mouth daily.     metoprolol succinate (TOPROL-XL) 50 MG 24 hr tablet Take 1 tablet (50 mg total) by mouth daily at 12 noon. Take with or immediately following a meal. (Patient not taking: Reported on 07/05/2023) 90 tablet 3   nitroGLYCERIN (NITROSTAT) 0.4 MG SL tablet PLACE 1 TABLET UNDER THE TONGUE EVERY 5 MINUTES FOR CHEST PAIN 100 tablet 0   ondansetron (ZOFRAN-ODT) 8 MG disintegrating tablet Take 1 tablet (8 mg total) by mouth every 8 (eight) hours as needed for nausea or vomiting. (Patient not taking: Reported on 07/05/2023) 20 tablet 1   pantoprazole (PROTONIX) 40 MG tablet Take 40 mg by mouth daily. (Patient not taking: Reported on 07/05/2023)     ranolazine (RANEXA) 500 MG 12 hr tablet Take 500 mg by mouth 2 (two) times daily.     rosuvastatin (CRESTOR) 40 MG tablet Take 1 tablet (40 mg total) by mouth daily. 90 tablet 3   sacubitril-valsartan (ENTRESTO) 24-26 MG Take 1 tablet by mouth 2 (two) times daily. 60 tablet 6   Semaglutide, 2 MG/DOSE, 8 MG/3ML SOPN Inject 2 mg into  the skin once a week.     No current facility-administered medications for this visit.     Review of Systems    ***.  All other systems reviewed and are otherwise negative except as noted above.    Physical Exam    VS:  There were no vitals taken for this visit. , BMI There is no height or weight on file to calculate BMI. STOP-Bang Score:     { Consider Dx Sleep Disordered Breathing or Sleep Apnea  ICD G47.33          :1}    GEN: Well nourished, well developed, in no acute distress. HEENT: normal. Neck: Supple, no JVD, carotid bruits, or masses. Cardiac: RRR, no murmurs, rubs, or gallops. No clubbing, cyanosis, edema.  Radials/DP/PT 2+ and equal bilaterally.  Respiratory:  Respirations regular and unlabored, clear to auscultation bilaterally. GI: Soft, nontender, nondistended, BS + x 4. MS: no deformity or atrophy. Skin: warm and dry, no rash. Neuro:  Strength and sensation are intact. Psych: Normal affect.  Accessory Clinical Findings    ECG personally reviewed by me today -    - no acute changes.   Lab Results  Component Value Date   WBC 5.7 06/28/2023   HGB 11.4 (L) 06/28/2023   HCT 33.8 (L) 06/28/2023   MCV 89.9 06/28/2023   PLT 130.0 (L) 06/28/2023   Lab Results  Component Value Date   CREATININE 1.82 (H) 06/28/2023   BUN 29 (H) 06/28/2023   NA 133 (L) 06/28/2023   K 4.8 06/28/2023   CL 101 06/28/2023   CO2 24 06/28/2023   Lab Results  Component Value Date   ALT 18 06/28/2023   AST 14 06/28/2023   ALKPHOS 68 06/28/2023   BILITOT 1.5 (H) 06/28/2023   Lab Results  Component Value Date   CHOL 152 06/28/2023   HDL 53.10 06/28/2023   LDLCALC 60 06/28/2023   LDLDIRECT 65.0 01/30/2023   TRIG 193.0 (H) 06/28/2023   CHOLHDL  3 06/28/2023    Lab Results  Component Value Date   HGBA1C 9.1 (H) 06/28/2023    Assessment & Plan    1.  ***  No BP recorded.  {Refresh Note OR Click here to enter BP  :1}***   Joylene Grapes, NP 08/17/2023, 6:49 AM

## 2023-09-10 ENCOUNTER — Other Ambulatory Visit: Payer: Self-pay | Admitting: Internal Medicine

## 2023-09-10 DIAGNOSIS — I25119 Atherosclerotic heart disease of native coronary artery with unspecified angina pectoris: Secondary | ICD-10-CM

## 2023-10-04 ENCOUNTER — Ambulatory Visit (HOSPITAL_BASED_OUTPATIENT_CLINIC_OR_DEPARTMENT_OTHER): Payer: Medicare Other | Admitting: Cardiology

## 2023-10-15 ENCOUNTER — Encounter

## 2023-10-16 MED ORDER — PANTOPRAZOLE SODIUM 40 MG PO TBEC
40 | ORAL_TABLET | Freq: Every day | ORAL | 3 refills | Status: AC
Start: 2023-10-16 — End: ?

## 2023-11-02 ENCOUNTER — Encounter

## 2023-11-02 MED ORDER — LANTUS SOLOSTAR 100 UNIT/ML SC SOPN
100 | SUBCUTANEOUS | 0 refills | Status: AC
Start: 2023-11-02 — End: ?

## 2023-11-02 NOTE — Telephone Encounter (Signed)
 Requesting refill for Lantus Solostar Pen, to be sent to [facility] ([place]) in [place]. Patient to call and schedule appt. for [facility].

## 2023-11-02 NOTE — Telephone Encounter (Signed)
 Left message to speak with patient to ask if he needs refill. Requested for patient to contact office re: refills and to schedule CPE.

## 2023-11-05 ENCOUNTER — Encounter

## 2023-11-08 ENCOUNTER — Telehealth: Payer: Self-pay | Admitting: Internal Medicine

## 2023-11-08 NOTE — Telephone Encounter (Unsigned)
 Copied from CRM [id number]. Topic: Clinical - Medication Question >> Nov 08, 2023 12:18 PM Russell Trevino wrote: Reason for CRM: pt called stating he has the flu and would like the provider to call in some medication for him.Pt is requesting a call back at [phone]

## 2023-11-09 ENCOUNTER — Ambulatory Visit: Payer: Self-pay | Admitting: Internal Medicine

## 2023-11-09 NOTE — Telephone Encounter (Signed)
 Copied From CRM [id number]. Reason for Triage: Patient has low grade fever, congestion, coughing, fatigue, x3 days [phone]   Chief Complaint: fever cough Symptoms: see above Frequency: x 3 days Pertinent Negatives: Patient denies cp, sob Disposition: [] ED /[] Urgent Care (no appt availability in office) / [x] Appointment(In office/virtual)/ []  [facility]/ [] Home Care/ [] Refused Recommended Disposition /[] [facility]/ []  Follow-up with PCP Additional Notes: per protocol, apt made for tomorrow.  Care advice given, denies questions, instructed to go to the ER if becomes worse.   Reason for Disposition  Fever present > 3 days (72 hours)  Answer Assessment - Initial Assessment Questions 1. TEMPERATURE: "What is the most recent temperature?"  "How was it measured?"      101 2. ONSET: "When did the fever start?"      Three days ago 3. CHILLS: "Do you have chills?" If yes: "How bad are they?"  (e.g., none, mild, moderate, severe)   - NONE: no chills   - MILD: feeling cold   - MODERATE: feeling very cold, some shivering (feels better under a thick blanket)   - SEVERE: feeling extremely cold with shaking chills (general body shaking, rigors; even under a thick blanket)      severe 4. OTHER SYMPTOMS: "Do you have any other symptoms besides the fever?"  (e.g., abdomen pain, cough, diarrhea, earache, headache, sore throat, urination pain)     Cough,  5. CAUSE: If there are no symptoms, ask: "What do you think is causing the fever?"      Runny nose; unknown 6. CONTACTS: "Does anyone else in the family have an infection?"     denies 7. TREATMENT: "What have you done so far to treat this fever?" (e.g., medications)     aspirin 8. IMMUNOCOMPROMISE: "Do you have of the following: diabetes, HIV positive, splenectomy, cancer chemotherapy, chronic steroid treatment, transplant patient, etc."     Prostate cancer and DM 9. PREGNANCY: "Is there any chance you are pregnant?" "When  was your last menstrual period?"     no 10. TRAVEL: "Have you traveled out of the country in the last month?" (e.g., travel history, exposures)       no  Protocols used: Fever-A-AH

## 2023-11-09 NOTE — Telephone Encounter (Signed)
 How does he know he has the flu ?  Was he tested for it?

## 2023-11-09 NOTE — Telephone Encounter (Signed)
 Called pt and he reports he was not tested, has appt tomorrow w Judeth Cornfield to test

## 2023-11-10 ENCOUNTER — Ambulatory Visit (INDEPENDENT_AMBULATORY_CARE_PROVIDER_SITE_OTHER): Payer: Medicare Other

## 2023-11-10 ENCOUNTER — Encounter: Payer: Self-pay | Admitting: Family Medicine

## 2023-11-10 ENCOUNTER — Ambulatory Visit (INDEPENDENT_AMBULATORY_CARE_PROVIDER_SITE_OTHER): Payer: Medicare Other | Admitting: Family Medicine

## 2023-11-10 VITALS — BP 140/70 | HR 85 | Temp 98.1°F | Ht 72.0 in | Wt 201.0 lb

## 2023-11-10 DIAGNOSIS — N1832 Chronic kidney disease, stage 3b: Secondary | ICD-10-CM

## 2023-11-10 DIAGNOSIS — I5042 Chronic combined systolic (congestive) and diastolic (congestive) heart failure: Secondary | ICD-10-CM | POA: Diagnosis not present

## 2023-11-10 DIAGNOSIS — R0602 Shortness of breath: Secondary | ICD-10-CM

## 2023-11-10 DIAGNOSIS — U071 COVID-19: Secondary | ICD-10-CM

## 2023-11-10 DIAGNOSIS — I25119 Atherosclerotic heart disease of native coronary artery with unspecified angina pectoris: Secondary | ICD-10-CM | POA: Diagnosis not present

## 2023-11-10 DIAGNOSIS — R112 Nausea with vomiting, unspecified: Secondary | ICD-10-CM | POA: Diagnosis not present

## 2023-11-10 DIAGNOSIS — R051 Acute cough: Secondary | ICD-10-CM | POA: Diagnosis not present

## 2023-11-10 DIAGNOSIS — R059 Cough, unspecified: Secondary | ICD-10-CM | POA: Diagnosis not present

## 2023-11-10 LAB — CBC WITH DIFFERENTIAL/PLATELET
Basophils Absolute: 0 10*3/uL (ref 0.0–0.1)
Basophils Relative: 0.4 % (ref 0.0–3.0)
Eosinophils Absolute: 0.1 10*3/uL (ref 0.0–0.7)
Eosinophils Relative: 1.2 % (ref 0.0–5.0)
HCT: 37.4 % — ABNORMAL LOW (ref 39.0–52.0)
Hemoglobin: 12.8 g/dL — ABNORMAL LOW (ref 13.0–17.0)
Lymphocytes Relative: 17.9 % (ref 12.0–46.0)
Lymphs Abs: 0.8 10*3/uL (ref 0.7–4.0)
MCHC: 34.3 g/dL (ref 30.0–36.0)
MCV: 90.4 fL (ref 78.0–100.0)
Monocytes Absolute: 0.5 10*3/uL (ref 0.1–1.0)
Monocytes Relative: 12.2 % — ABNORMAL HIGH (ref 3.0–12.0)
Neutro Abs: 3 10*3/uL (ref 1.4–7.7)
Neutrophils Relative %: 68.3 % (ref 43.0–77.0)
Platelets: 120 10*3/uL — ABNORMAL LOW (ref 150.0–400.0)
RBC: 4.14 Mil/uL — ABNORMAL LOW (ref 4.22–5.81)
RDW: 14.8 % (ref 11.5–15.5)
WBC: 4.4 10*3/uL (ref 4.0–10.5)

## 2023-11-10 LAB — COMPREHENSIVE METABOLIC PANEL
ALT: 13 U/L (ref 0–53)
AST: 18 U/L (ref 0–37)
Albumin: 4 g/dL (ref 3.5–5.2)
Alkaline Phosphatase: 59 U/L (ref 39–117)
BUN: 32 mg/dL — ABNORMAL HIGH (ref 6–23)
CO2: 23 meq/L (ref 19–32)
Calcium: 9.2 mg/dL (ref 8.4–10.5)
Chloride: 102 meq/L (ref 96–112)
Creatinine, Ser: 2.1 mg/dL — ABNORMAL HIGH (ref 0.40–1.50)
GFR: 30.68 mL/min — ABNORMAL LOW (ref >60.00)
Glucose, Bld: 184 mg/dL — ABNORMAL HIGH (ref 70–99)
Potassium: 4 meq/L (ref 3.5–5.1)
Sodium: 136 meq/L (ref 135–145)
Total Bilirubin: 1.4 mg/dL — ABNORMAL HIGH (ref 0.2–1.2)
Total Protein: 7.3 g/dL (ref 6.0–8.3)

## 2023-11-10 LAB — POCT INFLUENZA A/B
Influenza A, POC: NEGATIVE
Influenza B, POC: NEGATIVE

## 2023-11-10 LAB — POC COVID19 BINAXNOW: SARS Coronavirus 2 Ag: POSITIVE — AB

## 2023-11-10 MED ORDER — ONDANSETRON 4 MG PO TBDP: 4.0000 mg | ORAL_TABLET | Freq: Three times a day (TID) | ORAL | 0 refills | Status: DC | PRN

## 2023-11-10 MED ORDER — NIRMATRELVIR/RITONAVIR (PAXLOVID) TABLET (RENAL DOSING): 2.0000 | ORAL_TABLET | Freq: Two times a day (BID) | ORAL | 0 refills | Status: DC

## 2023-11-10 NOTE — Progress Notes (Signed)
 Acute Office Visit  Subjective:     Patient ID: Russell Trevino, male    DOB: 13-Aug-1951, 73 y.o.   MRN: [id number]  Chief Complaint  Patient presents with   Acute Visit    Ongoing for abouy 4 days. No appetite, dry cough, fevers, runny nose, sore throat. Has tried Ibuprofen    HPI Patient is in today for evaluation of cough, congestion, chills, weakness, fever, nausea, vomiting for the last 4 days. Has tried drinking pedialyte, also makes him nauseated Denies known sick contacts. Denies abdominal pain, diarrhea, rash, other symptoms.  Medical hx as outlined below.  ROS Per HPI      Objective:    BP (!) 140/70 (BP Location: Left Arm, Patient Position: Sitting)   Pulse 85   Temp 98.1 F (36.7 C) (Temporal)   Ht 6' (1.829 m)   Wt 201 lb (91.2 kg)   SpO2 95%   BMI 27.26 kg/m    Physical Exam Vitals and nursing note reviewed.  Constitutional:      General: He is not in acute distress.    Appearance: He is ill-appearing.  HENT:     Head: Normocephalic and atraumatic.     Right Ear: Tympanic membrane and ear canal normal.     Left Ear: Tympanic membrane normal.     Nose: Congestion present.     Mouth/Throat:     Mouth: Mucous membranes are moist.     Pharynx: Oropharynx is clear. No oropharyngeal exudate or posterior oropharyngeal erythema.     Comments: Oropharyngeal cobblestoning   Eyes:     Extraocular Movements: Extraocular movements intact.  Cardiovascular:     Rate and Rhythm: Normal rate and regular rhythm.     Heart sounds: Normal heart sounds.  Pulmonary:     Effort: Pulmonary effort is normal.     Breath sounds: Examination of the right-lower field reveals decreased breath sounds. Examination of the left-lower field reveals decreased breath sounds. Decreased breath sounds and wheezing present.  Musculoskeletal:     Cervical back: Normal range of motion.  Lymphadenopathy:     Cervical: Cervical adenopathy present.  Neurological:     General: No  focal deficit present.     Mental Status: He is alert and oriented to person, place, and time.    Results for orders placed or performed in visit on 11/10/23  POCT Influenza A/B  Result Value Ref Range   Influenza A, POC Negative Negative   Influenza B, POC Negative Negative  POC COVID-19 BinaxNow  Result Value Ref Range   SARS Coronavirus 2 Ag Positive (A) Negative        Assessment & Plan:  1. COVID-19 (Primary)  - DG Chest 2 View; Future - CBC with Differential/Platelet - Comprehensive metabolic panel  2. Acute cough  - POCT Influenza A/B - POC COVID-19 BinaxNow - DG Chest 2 View; Future  3. SOB (shortness of breath)  - DG Chest 2 View; Future  4. Nausea and vomiting, unspecified vomiting type  - CBC with Differential/Platelet - Comprehensive metabolic panel - ondansetron (ZOFRAN-ODT) 4 MG disintegrating tablet; Take 1 tablet (4 mg total) by mouth every 8 (eight) hours as needed for nausea or vomiting.  Dispense: 20 tablet; Refill: 0  5. Chronic combined systolic and diastolic heart failure (HCC)  - CBC with Differential/Platelet - Comprehensive metabolic panel  6. Coronary artery disease involving native coronary artery of native heart with angina pectoris (HCC)  - CBC with Differential/Platelet - Comprehensive metabolic  panel  7. Stage 3b chronic kidney disease (HCC)  - CBC with Differential/Platelet - Comprehensive metabolic panel   Meds ordered this encounter  Medications   nirmatrelvir/ritonavir, renal dosing, (PAXLOVID) 10 x 150 MG & 10 x 100MG  TABS    Sig: Take 2 tablets by mouth 2 (two) times daily for 5 days. (Take nirmatrelvir 150 mg one tablet twice daily for 5 days and ritonavir 100 mg one tablet twice daily for 5 days) Patient GFR is 36    Dispense:  20 tablet    Refill:  0   ondansetron (ZOFRAN-ODT) 4 MG disintegrating tablet    Sig: Take 1 tablet (4 mg total) by mouth every 8 (eight) hours as needed for nausea or vomiting.    Dispense:   20 tablet    Refill:  0    Return if symptoms worsen or fail to improve.  Moshe Cipro, FNP

## 2023-11-10 NOTE — Patient Instructions (Addendum)
 I have sent in Paxlovid for you to take twice a day for 5 days.    DO NOT take plavix or crestor with paxlovid  I recommend you eat with this medication, as it can upset your stomach if you do not.  This medication can sometimes leave a bad taste in your mouth, it is okay to use mints, gum, to help combat this.    If this occurs, it is temporary and will resolve once the medication course is completed. There is a minor risk of rebound COVID after taking Paxlovid.  Continue supportive care at home, make sure you are drinking plenty of fluids and getting some rest.  May take Tylenol as needed for fever, aches and pains.  Stay home and away from everyone until you are 24 hours fever free without fever reducing medications.  Follow-up with me if symptoms are persisting over the next week.  Follow-up in the emergency room if you are experiencing shortness of breath, high fever, unremitting cough, severe fatigue, other concerning symptoms.

## 2023-11-13 ENCOUNTER — Other Ambulatory Visit: Payer: Self-pay | Admitting: Internal Medicine

## 2023-11-13 DIAGNOSIS — U071 COVID-19: Secondary | ICD-10-CM

## 2023-11-13 NOTE — Telephone Encounter (Signed)
 Copied from CRM [id number]. Topic: Clinical - Prescription Issue >> Nov 13, 2023 11:39 AM Shelbie Proctor wrote: Reason for CRM: Patient [phone] states his dog accidently destroyed the medication, patient needs 3 days supply of Paxlovid to finish the treatment. [id number] - [place], [place] - [address] AT [facility] [address] [address] Phone:[phone]

## 2023-11-14 MED ORDER — NIRMATRELVIR/RITONAVIR (PAXLOVID) TABLET (RENAL DOSING)
2.0000 | ORAL_TABLET | Freq: Two times a day (BID) | ORAL | 0 refills | Status: AC
Start: 2023-11-14 — End: 2023-11-17

## 2023-11-14 NOTE — Telephone Encounter (Signed)
 pa

## 2023-11-14 NOTE — Telephone Encounter (Signed)
 Copied from CRM [id number]. Topic: Clinical - Prescription Issue >> Nov 13, 2023  2:27 PM Kathryne Eriksson wrote: Reason for CRM: nirmatrelvir/ritonavir, renal dosing, (PAXLOVID) 10 x 150 MG & 10 x 100MG  TABS >> Nov 14, 2023  9:59 AM Turkey A wrote: Patient called to check status of medication for Covid. Agent informed patient that it was being worked on. Patient asked to speak to Print production planner. Agent called CAL line and spoke with [name] who put another note in to nurse regarding medication. Patient was not pleased and wanted to speak to Office Manager regarding medication

## 2023-11-14 NOTE — Telephone Encounter (Signed)
 Patient has requested call from Office manager at this time in regard to request for replacement medication.   Dr Lawerance Bach please advise if replacing medication is an option at this time

## 2023-11-17 NOTE — Telephone Encounter (Signed)
 Called patient left message.   Copied from CRM [id number]. Topic: Clinical - Prescription Issue >> Nov 13, 2023  2:27 PM Kathryne Eriksson wrote: Reason for CRM: nirmatrelvir/ritonavir, renal dosing, (PAXLOVID) 10 x 150 MG & 10 x 100MG  TABS >> Nov 14, 2023  9:59 AM Turkey A wrote: Patient called to check status of medication for Covid. Agent informed patient that it was being worked on. Patient asked to speak to Print production planner. Agent called CAL line and spoke with [name] who put another note in to nurse regarding medication. Patient was not pleased and wanted to speak to Office Manager regarding medication >> Nov 13, 2023  2:29 PM Kathryne Eriksson wrote: Patient called in wanting an update on rather or not his replacement prescription had been called into the pharmacy. States his dog got ahold of the box with his nirmatrelvir/ritonavir, renal dosing, (PAXLOVID) 10 x 150 MG & 10 x 100MG  TABS and destroyed it. Patient states the only pharmacy that currently has this medication in stock is the [facility] on [address]. I informed the patient that his message from earlier has been sent over to Dr. Lawerance Bach team and that they need time to review the message and decide on next steps.

## 2023-11-23 ENCOUNTER — Ambulatory Visit: Payer: Medicare Other

## 2023-12-08 ENCOUNTER — Other Ambulatory Visit: Payer: Self-pay | Admitting: Internal Medicine

## 2023-12-08 DIAGNOSIS — I25119 Atherosclerotic heart disease of native coronary artery with unspecified angina pectoris: Secondary | ICD-10-CM

## 2023-12-18 ENCOUNTER — Encounter

## 2023-12-18 MED ORDER — FINASTERIDE 5 MG PO TABS
5 | ORAL_TABLET | ORAL | 0 refills | Status: AC
Start: 2023-12-18 — End: ?

## 2023-12-18 MED ORDER — OZEMPIC (0.25 OR 0.5 MG/DOSE) 2 MG/1.5ML SC SOPN
2 | SUBCUTANEOUS | 1 refills | Status: AC
Start: 2023-12-18 — End: ?

## 2023-12-18 MED ORDER — CLOPIDOGREL BISULFATE 75 MG PO TABS
75 | ORAL_TABLET | Freq: Every day | ORAL | 0 refills | Status: AC
Start: 2023-12-18 — End: ?

## 2023-12-18 MED ORDER — NITROGLYCERIN 0.4 MG SL SUBL
0.4 | ORAL_TABLET | SUBLINGUAL | 3 refills | Status: AC
Start: 2023-12-18 — End: ?

## 2023-12-18 MED ORDER — ROSUVASTATIN CALCIUM 40 MG PO TABS
40 | ORAL_TABLET | Freq: Every day | ORAL | 0 refills | Status: AC
Start: 2023-12-18 — End: ?

## 2023-12-18 NOTE — Telephone Encounter (Signed)
 Patient states that he will be in [place] for approx. another month to continue treatments. Requesting refill for Nitroglycerin.

## 2024-01-10 DIAGNOSIS — E113493 Type 2 diabetes mellitus with severe nonproliferative diabetic retinopathy without macular edema, bilateral: Secondary | ICD-10-CM | POA: Diagnosis not present

## 2024-01-10 LAB — HM DIABETES EYE EXAM

## 2024-01-16 ENCOUNTER — Other Ambulatory Visit: Payer: Self-pay | Admitting: Family Medicine

## 2024-01-16 ENCOUNTER — Encounter

## 2024-01-16 DIAGNOSIS — R112 Nausea with vomiting, unspecified: Secondary | ICD-10-CM

## 2024-01-19 ENCOUNTER — Ambulatory Visit (INDEPENDENT_AMBULATORY_CARE_PROVIDER_SITE_OTHER)

## 2024-01-19 VITALS — BP 110/60 | HR 57 | Ht 70.75 in | Wt 208.2 lb

## 2024-01-19 DIAGNOSIS — Z794 Long term (current) use of insulin: Secondary | ICD-10-CM | POA: Diagnosis not present

## 2024-01-19 DIAGNOSIS — E1159 Type 2 diabetes mellitus with other circulatory complications: Secondary | ICD-10-CM

## 2024-01-19 DIAGNOSIS — Z0000 Encounter for general adult medical examination without abnormal findings: Secondary | ICD-10-CM

## 2024-01-19 NOTE — Progress Notes (Signed)
 Subjective:   Russell Trevino is a 73 y.o. who presents for a Medicare Wellness preventive visit.  As a reminder, Annual Wellness Visits don't include a physical exam, and some assessments may be limited, especially if this visit is performed virtually. We may recommend an in-person visit if needed.  Visit Complete: In person   Persons Participating in Visit: Patient.  AWV Questionnaire: No: Patient Medicare AWV questionnaire was not completed prior to this visit.  Cardiac Risk Factors include: advanced age (>60men, >16 women);male gender;Other (see comment), Risk factor comments: CKD stage 3, CAD, BPH     Objective:     Today's Vitals   01/19/24 1027  BP: 110/60  Pulse: (!) 57  SpO2: 99%  Weight: 208 lb 3.2 oz (94.4 kg)  Height: 5' 10.75" (1.797 m)   Body mass index is 29.24 kg/m.     01/19/2024   10:38 AM 10/06/2022    9:59 AM 10/04/2022    2:53 PM 12/17/2021    2:57 AM 12/16/2021    4:15 PM 01/28/2019    8:16 PM 10/25/2018    9:23 AM  Advanced Directives  Does Patient Have a Medical Advance Directive? Yes No No  No Yes No  Type of Estate agent of [place];Living will     Healthcare Power of [place];Living will   Does patient want to make changes to medical advance directive?      No - Patient declined   Copy of Healthcare Power of Attorney in Chart? No - copy requested     No - copy requested   Would patient like information on creating a medical advance directive?  No - Patient declined No - Patient declined No - Patient declined   No - Patient declined    Current Medications (verified) Outpatient Encounter Medications as of 01/19/2024  Medication Sig   clopidogrel  (PLAVIX ) 75 MG tablet Take 75 mg by mouth daily.   clopidogrel  (PLAVIX ) 75 MG tablet Take 1 tablet by mouth daily.   ezetimibe  (ZETIA ) 10 MG tablet Take 10 mg by mouth daily.   famotidine  (PEPCID ) 40 MG tablet Take 40 mg by mouth at bedtime.   finasteride  (PROSCAR ) 5 MG tablet TAKE 1  TABLET(5 MG) BY MOUTH DAILY (Patient taking differently: Take 5 mg by mouth at bedtime.)   insulin  glargine (LANTUS  SOLOSTAR) 100 UNIT/ML Solostar Pen Inject 30 Units into the skin 2 (two) times daily.   Insulin  Pen Needle 31G X 6 MM MISC UAD for insulin  pen   Insulin  Syringe-Needle U-100 (BD VEO INSULIN  SYRINGE U/F) 31G X 15/64" 1 ML MISC use as directed TO INJECT INSULIN   twice a day   isosorbide  mononitrate (IMDUR ) 120 MG 24 hr tablet Take 1 tablet (120 mg total) by mouth daily.   metoprolol  succinate (TOPROL -XL) 50 MG 24 hr tablet Take 1 tablet (50 mg total) by mouth daily at 12 noon. Take with or immediately following a meal.   nitroGLYCERIN  (NITROSTAT ) 0.4 MG SL tablet PLACE 1 TABLET UNDER THE TONGUE EVERY 5 MINUTES FOR CHEST PAIN   ondansetron  (ZOFRAN -ODT) 4 MG disintegrating tablet DISSOLVE 1 TABLET(4 MG) ON THE TONGUE EVERY 8 HOURS AS NEEDED FOR NAUSEA OR VOMITING   pantoprazole  (PROTONIX ) 40 MG tablet Take 40 mg by mouth daily.   ranolazine  (RANEXA ) 500 MG 12 hr tablet Take 500 mg by mouth 2 (two) times daily.   rosuvastatin  (CRESTOR ) 40 MG tablet Take 1 tablet (40 mg total) by mouth daily.   sacubitril -valsartan  (ENTRESTO ) 24-26  MG Take 1 tablet by mouth 2 (two) times daily.   Semaglutide , 2 MG/DOSE, 8 MG/3ML SOPN Inject 2 mg into the skin once a week.   cyclobenzaprine  (FLEXERIL ) 10 MG tablet Take 1 tablet by mouth at bedtime. (Patient not taking: Reported on 01/19/2024)   eszopiclone  (LUNESTA ) 2 MG TABS tablet  (Patient not taking: Reported on 01/19/2024)   gabapentin  (NEURONTIN ) 100 MG capsule TAKE 1 CAPSULE BY MOUTH EVERY MORNING AND 2 AT BEDTIME (Patient not taking: Reported on 01/19/2024)   No facility-administered encounter medications on file as of 01/19/2024.    Allergies (verified) Patient has no allergy information on record.   History: Past Medical History:  Diagnosis Date   BPH (benign prostatic hypertrophy)    Cancer (HCC)    prostate   Cardiomyopathy, ischemic    CHF  (congestive heart failure) (HCC)    Coronary artery disease    at [facility] [place] 2009 (GRAFTS PATENT), cath 05/2018 w/ SVG-PDA 100%>>med rx   Diabetes mellitus, type 2 (HCC)    Frequency of urination    GERD (gastroesophageal reflux disease)    Gross hematuria    History of CHF (congestive heart failure) 2007   History of kidney stones    History of myocardial infarction 2006, 2019   2006>>CABG, 2019 cath w/ med rx   History of urinary retention    Hyperlipidemia    Hypertension    Myocardial infarction (HCC)    2006   Nocturia    S/P CABG x 4 2006   Past Surgical History:  Procedure Laterality Date   CARDIAC CATHETERIZATION  01/17/2008   GRAFTS PATENT / EF 40%,  DR SANTOS ([facility])   CORONARY ARTERY BYPASS GRAFT  04/2005   LIMA-LAD, L radial-OM-D1 and SVG-PDA   CYSTO / RIGHT URETERAL STENT PLACEMENT  10-08-1999   CYSTO/  REMOVAL BLADDER STONES  01-14-2010   CYSTOSCOPY  10/16/2012   Procedure: CYSTOSCOPY;  Surgeon: Willye Harvey, MD;  Location: [facility];  Service: Urology;  Laterality: N/A;  Cystoscopy with Clot Evacuation and fulguration bladder neck.   EXTRACORPOREAL SHOCK WAVE LITHOTRIPSY  10-15-2009   RIGHT   GOLD SEED IMPLANT N/A 10/06/2022   Procedure: GOLD SEED IMPLANT;  Surgeon: Florencio Hunting, MD;  Location: [facility];  Service: Urology;  Laterality: N/A;   KNEE ARTHROSCOPY  1978   RIGHT   LEFT HEART CATH AND CORS/GRAFTS ANGIOGRAPHY N/A 12/17/2021   Procedure: LEFT HEART CATH AND CORS/GRAFTS ANGIOGRAPHY;  Surgeon: Kyra Phy, MD;  Location: [facility]ASIVE [facility];  Service: Cardiovascular;  Laterality: N/A;   LEFT SHOULDER SURGERY   2002   LUMBAR FUSION  09-10-2008   L4 - L5   PERCUTANEOUS NEPHROSTOLITHOTOMY  1979   PROSTATE BIOPSY  10/16/2012   Procedure: PROSTATE BIOPSY;  Surgeon: Willye Harvey, MD;  Location: [facility];  Service: Urology;  Laterality: N/A;  Through ultrasound.   RIGHT/LEFT HEART CATH AND CORONARY/GRAFT ANGIOGRAPHY N/A  10/26/2018   Procedure: RIGHT/LEFT HEART CATH AND CORONARY/GRAFT ANGIOGRAPHY;  Surgeon: Swaz[place], Peter M, MD;  Location: [facility] INVASIVE CV LAB;  Service: Cardiovascular;  Laterality: N/A;   SHOULDER ARTHROSCOPY W/ SUBACROMIAL DECOMPRESSION AND DISTAL CLAVICLE EXCISION  10-11-2004   AND PARTIAL ROTATOR CUFF REPAIR   SPACE OAR INSTILLATION N/A 10/06/2022   Procedure: SPACE OAR INSTILLATION;  Surgeon: Florencio Hunting, MD;  Location: [facility];  Service: Urology;  Laterality: N/A;  30 MINS FOR CASE   TRANSTHORACIC ECHOCARDIOGRAM  01/09/2012   LV SYSTOLIC FUNCTION  MILDLY REDUCED/ EF 48%/ LV FILLING PATTERN  IS IMPAIRED/ HYPOKINESIS IN THE MID AND BASALAR SEPTUM & ANTEROSEPTUM   TURP VAPORIZATION  01-19-2010   BPH W/ BOO   URETEROLITHOTOMY  1976   Family History  Problem Relation Age of Onset   Lung cancer Mother 38   Diabetes Father    Stomach cancer Neg Hx    Colon cancer Neg Hx    Rectal cancer Neg Hx    Esophageal cancer Neg Hx    Pancreatic cancer Neg Hx    Social History   Socioeconomic History   Marital status: Legally Separated    Spouse name: Not on file   Number of children: 4   Years of education: Not on file   Highest education level: Not on file  Occupational History   Occupation: RETIRED  Tobacco Use   Smoking status: Never   Smokeless tobacco: Never  Vaping Use   Vaping status: Never Used  Substance and Sexual Activity   Alcohol use: Not Currently    Alcohol/week: 2.0 standard drinks of alcohol    Types: 2 Cans of beer per week   Drug use: No   Sexual activity: Not Currently  Other Topics Concern   Not on file  Social History Narrative   Lives in [place], [place], has family and friends in [place].   Drinking caffeine sodas 3-4 bottles a day   Right handed      Lives with a his girlfriend here in [place]   Social Drivers of Health   Financial Resource Strain: Low Risk  (01/19/2024)   Overall Financial Resource Strain (CARDIA)    Difficulty of Paying Living  Expenses: Not hard at all  Food Insecurity: No Food Insecurity (01/19/2024)   Hunger Vital Sign    Worried About Running Out of Food in the Last Year: Never true    Ran Out of Food in the Last Year: Never true  Transportation Needs: No Transportation Needs (01/19/2024)   PRAPARE - Administrator, Civil Service (Medical): No    Lack of Transportation (Non-Medical): No  Physical Activity: Sufficiently Active (01/19/2024)   Exercise Vital Sign    Days of Exercise per Week: 6 days    Minutes of Exercise per Session: 30 min  Stress: Stress Concern Present (01/19/2024)   [employer] of Occupational Health - Occupational Stress Questionnaire    Feeling of Stress : To some extent  Social Connections: Socially Isolated (01/19/2024)   Social Connection and Isolation Panel [NHANES]    Frequency of Communication with Friends and Family: Never    Frequency of Social Gatherings with Friends and Family: Never    Attends Religious Services: Never    Diplomatic Services operational officer: No    Attends Engineer, structural: Never    Marital Status: Separated    Tobacco Counseling Counseling given: Not Answered    Clinical Intake:  Pre-visit preparation completed: Yes  Pain : No/denies pain     BMI - recorded: 29.24 Nutritional Status: BMI 25 -29 Overweight Nutritional Risks: Nausea/ vomitting/ diarrhea (2 days ago) Diabetes: Yes CBG done?: No Did pt. bring in CBG monitor from home?: No  Lab Results  Component Value Date   HGBA1C 9.1 (H) 06/28/2023   HGBA1C 7.3 (H) 01/30/2023   HGBA1C 11.8 (H) 08/03/2022     How often do you need to have someone help you when you read instructions, pamphlets, or other written materials from your doctor or pharmacy?:  1 - Never  Interpreter Needed?: No  Information entered by :: Taura Lamarre, RMA   Activities of Daily Living     01/19/2024   10:23 AM  In your present state of health, do you have any difficulty performing  the following activities:  Hearing? 0  Vision? 0  Difficulty concentrating or making decisions? 0  Walking or climbing stairs? 0  Dressing or bathing? 0  Doing errands, shopping? 0  Preparing Food and eating ? N  Using the Toilet? N  In the past six months, have you accidently leaked urine? N  Do you have problems with loss of bowel control? N  Managing your Medications? N  Managing your Finances? N  Housekeeping or managing your Housekeeping? N    Patient Care Team: Colene Dauphin, MD as PCP - General (Internal Medicine) Sheryle Donning, MD as PCP - Cardiology (Cardiology) Florencio Hunting, MD as Consulting Physician (Urology) Homero Luster, MD as Attending Physician (Urology) Kenith Payer, MD as Consulting Physician (Radiation Oncology)  Indicate any recent Medical Services you may have received from other than [facility] providers in the past year (date may be approximate).     Assessment:    This is a routine wellness examination for Russell Trevino.  Hearing/Vision screen Hearing Screening - Comments:: Denies hearing difficulties   Vision Screening - Comments:: Wears eyeglasses/ Dr. Dain Drown   Goals Addressed   None    Depression Screen     01/19/2024   10:43 AM 01/30/2023   11:11 AM 08/03/2022   10:06 AM 08/03/2022   10:05 AM 03/24/2020    3:22 PM 02/11/2019   10:31 AM 07/13/2017    9:18 AM  PHQ 2/9 Scores  PHQ - 2 Score 1 6 0 0 0 0 0  PHQ- 9 Score 2 18 5         Fall Risk     01/19/2024   10:39 AM 01/30/2023   11:09 AM 08/03/2022   10:05 AM 03/24/2021    8:27 AM 03/24/2020    3:21 PM  Fall Risk   Falls in the past year? 1 1 0 0 0  Comment fell down some stairs at home      Number falls in past yr: 1 0 0 0 0  Injury with Fall? 0 0 0 0 0  Risk for fall due to : Impaired balance/gait No Fall Risks No Fall Risks No Fall Risks No Fall Risks  Follow up Falls evaluation completed;Falls prevention discussed Falls evaluation completed Falls evaluation completed  Falls evaluation completed     MEDICARE RISK AT HOME:  Medicare Risk at Home Any stairs in or around the home?: Yes (2 story home) If so, are there any without handrails?: Yes Home free of loose throw rugs in walkways, pet beds, electrical cords, etc?: Yes Adequate lighting in your home to reduce risk of falls?: Yes Life alert?: No Use of a cane, walker or w/c?: No Grab bars in the bathroom?: Yes Shower chair or bench in shower?: Yes Elevated toilet seat or a handicapped toilet?: Yes  TIMED UP AND GO:  Was the test performed?  Yes  Length of time to ambulate 10 feet: 15 sec Gait slow and steady without use of assistive device  Cognitive Function: Declined/Normal: No cognitive concerns noted by patient or family. Patient alert, oriented, able to answer questions appropriately and recall recent events. No signs of memory loss or confusion.        Immunizations Immunization History  Administered Date(s) Administered  Fluad Quad(high Dose 65+) 05/13/2019, 11/03/2020   Influenza, High Dose Seasonal PF 06/20/2018, 06/12/2019, 06/23/2022   Influenza,inj,Quad PF,6+ Mos 06/21/2016   Influenza-Unspecified 05/30/2017   Moderna Sars-Covid-2 Vaccination 11/12/2019, 12/10/2019   Pneumococcal Conjugate-13 07/13/2017   Pneumococcal Polysaccharide-23 02/11/2019   Tdap 03/02/2012    Screening Tests Health Maintenance  Topic Date Due   Zoster Vaccines- Shingrix (1 of 2) Never done   COVID-19 Vaccine (3 - Moderna risk series) 01/07/2020   FOOT EXAM  05/12/2020   DTaP/Tdap/Td (2 - Td or Tdap) 03/02/2022   HEMOGLOBIN A1C  12/27/2023   Diabetic kidney evaluation - Urine ACR  01/30/2024   INFLUENZA VACCINE  04/12/2024   Diabetic kidney evaluation - eGFR measurement  11/09/2024   OPHTHALMOLOGY EXAM  01/09/2025   Medicare Annual Wellness (AWV)  01/18/2025   Colonoscopy  09/29/2031   Pneumonia Vaccine 76+ Years old  Completed   Hepatitis C Screening  Completed   HPV VACCINES  Aged Out    Meningococcal B Vaccine  Aged Out    Health Maintenance  Health Maintenance Due  Topic Date Due   Zoster Vaccines- Shingrix (1 of 2) Never done   COVID-19 Vaccine (3 - Moderna risk series) 01/07/2020   FOOT EXAM  05/12/2020   DTaP/Tdap/Td (2 - Td or Tdap) 03/02/2022   HEMOGLOBIN A1C  12/27/2023   Diabetic kidney evaluation - Urine ACR  01/30/2024   Health Maintenance Items Addressed: Diabetic Foot Exam scheduled, A1C, UACR (Urine Albumin:Creatinine Ratio), See Nurse Notes  Additional Screening:  Vision Screening: Recommended annual ophthalmology exams for early detection of glaucoma and other disorders of the eye.  Dental Screening: Recommended annual dental exams for proper oral hygiene  Community Resource Referral / Chronic Care Management: CRR required this visit?  No   CCM required this visit?  No   Plan:    I have personally reviewed and noted the following in the patient's chart:   Medical and social history Use of alcohol, tobacco or illicit drugs  Current medications and supplements including opioid prescriptions. Patient is not currently taking opioid prescriptions. Functional ability and status Nutritional status Physical activity Advanced directives List of other physicians Hospitalizations, surgeries, and ER visits in previous 12 months Vitals Screenings to include cognitive, depression, and falls Referrals and appointments  In addition, I have reviewed and discussed with patient certain preventive protocols, quality metrics, and best practice recommendations. A written personalized care plan for preventive services as well as general preventive health recommendations were provided to patient.   Carolyn Sylvia L Kamree Wiens, CMA   01/19/2024   After Visit Summary: (MyChart) Due to this being a telephonic visit, the after visit summary with patients personalized plan was offered to patient via MyChart   Notes: Please refer to Routing Comments.

## 2024-01-19 NOTE — Patient Instructions (Signed)
 Russell Trevino , Thank you for taking time out of your busy schedule to complete your Annual Wellness Visit with me. I enjoyed our conversation and look forward to speaking with you again next year. I, as well as your care team,  appreciate your ongoing commitment to your health goals. Please review the following plan we discussed and let me know if I can assist you in the future. Your Game plan/ To Do List     Follow up Visits: Next Medicare AWV with our clinical staff: 01/21/2025.   Have you seen your provider in the last 6 months (3 months if uncontrolled diabetes)? Yes Next Office Visit with your provider: 02/09/2024.  Clinician Recommendations:  Aim for 30 minutes of exercise or brisk walking, 6-8 glasses of water , and 5 servings of fruits and vegetables each day. You are due for a A1C check and a kidney evaluation along with a foot exam and will get that done during your next office visit with PCP.   You are also due for a Shingles vaccine and a tetanus vaccine.  You can get these both at your local pharmacy.      This is a list of the screening recommended for you and due dates:  Health Maintenance  Topic Date Due   Zoster (Shingles) Vaccine (1 of 2) Never done   COVID-19 Vaccine (3 - Moderna risk series) 01/07/2020   Complete foot exam   05/12/2020   DTaP/Tdap/Td vaccine (2 - Td or Tdap) 03/02/2022   Hemoglobin A1C  12/27/2023   Yearly kidney health urinalysis for diabetes  01/30/2024   Flu Shot  04/12/2024   Yearly kidney function blood test for diabetes  11/09/2024   Eye exam for diabetics  01/09/2025   Medicare Annual Wellness Visit  01/18/2025   Colon Cancer Screening  09/29/2031   Pneumonia Vaccine  Completed   Hepatitis C Screening  Completed   HPV Vaccine  Aged Out   Meningitis B Vaccine  Aged Out    Advanced directives: (Copy Requested) Please bring a copy of your health care power of attorney and living will to the office to be added to your chart at your convenience. You  can mail to [facility] [address] [place], [place] [address] or email to [email or web address] Advance Care Planning is important because it:  [x]  Makes sure you receive the medical care that is consistent with your values, goals, and preferences  [x]  It provides guidance to your family and loved ones and reduces their decisional burden about whether or not they are making the right decisions based on your wishes.  Follow the link provided in your after visit summary or read over the paperwork we have mailed to you to help you started getting your Advance Directives in place. If you need assistance in completing these, please reach out to us  so that we can help you!  See attachments for Preventive Care and Fall Prevention Tips.

## 2024-02-08 ENCOUNTER — Encounter

## 2024-02-08 MED ORDER — ISOSORBIDE MONONITRATE ER 120 MG PO TB24
120 | ORAL_TABLET | Freq: Two times a day (BID) | ORAL | 0 refills | 90.00000 days | Status: AC
Start: 2024-02-08 — End: ?

## 2024-02-08 MED ORDER — METOPROLOL SUCCINATE ER 50 MG PO TB24
50 | ORAL_TABLET | Freq: Every day | ORAL | 0 refills | 90.00000 days | Status: AC
Start: 2024-02-08 — End: ?

## 2024-02-08 NOTE — Patient Instructions (Addendum)
      Blood work was ordered.       Medications changes include :   increase gabapentin  to 400-500 mg at night - we can increase up to 600 mg if needed     Return in about 6 months (around 08/11/2024) for follow up.

## 2024-02-08 NOTE — Progress Notes (Unsigned)
 Subjective:    Patient ID: Russell Trevino, male    DOB: 1951-08-09, 73 y.o.   MRN: [id number]     HPI Russell Trevino is here for follow up of his chronic medical problems.  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  Numbness in feet - has poor balance.  No falls.  Walking daily - 200 yards and back.   Medications and allergies reviewed with patient and updated if appropriate.  Current Outpatient Medications on File Prior to Visit  Medication Sig Dispense Refill   clopidogrel  (PLAVIX ) 75 MG tablet Take 1 tablet by mouth daily.     ezetimibe  (ZETIA ) 10 MG tablet Take 10 mg by mouth daily.     famotidine  (PEPCID ) 40 MG tablet Take 40 mg by mouth at bedtime.     finasteride  (PROSCAR ) 5 MG tablet TAKE 1 TABLET(5 MG) BY MOUTH DAILY (Patient taking differently: Take 5 mg by mouth at bedtime.) 90 tablet 1   gabapentin  (NEURONTIN ) 100 MG capsule TAKE 1 CAPSULE BY MOUTH EVERY MORNING AND 2 AT BEDTIME 90 capsule 3   insulin  glargine (LANTUS  SOLOSTAR) 100 UNIT/ML Solostar Pen Inject 30 Units into the skin 2 (two) times daily. 30 mL 5   Insulin  Pen Needle 31G X 6 MM MISC UAD for insulin  pen 60 each 5   Insulin  Syringe-Needle U-100 (BD VEO INSULIN  SYRINGE U/F) 31G X 15/64" 1 ML MISC use as directed TO INJECT INSULIN   twice a day 10 each 2   isosorbide  mononitrate (IMDUR ) 120 MG 24 hr tablet Take 1 tablet (120 mg total) by mouth daily.     metoprolol  succinate (TOPROL -XL) 50 MG 24 hr tablet Take 1 tablet (50 mg total) by mouth daily at 12 noon. Take with or immediately following a meal. 90 tablet 3   nitroGLYCERIN  (NITROSTAT ) 0.4 MG SL tablet PLACE 1 TABLET UNDER THE TONGUE EVERY 5 MINUTES FOR CHEST PAIN 25 tablet 0   ondansetron  (ZOFRAN -ODT) 4 MG disintegrating tablet DISSOLVE 1 TABLET(4 MG) ON THE TONGUE EVERY 8 HOURS AS NEEDED FOR NAUSEA OR VOMITING 20 tablet 0   OZEMPIC , 0.25 OR 0.5 MG/DOSE, 2 MG/3ML SOPN  Inject 0.5 mg into the skin once a week.     pantoprazole  (PROTONIX ) 40 MG tablet Take 40 mg by mouth daily.     ranolazine  (RANEXA ) 500 MG 12 hr tablet Take 500 mg by mouth 2 (two) times daily.     rosuvastatin  (CRESTOR ) 40 MG tablet Take 1 tablet (40 mg total) by mouth daily. 90 tablet 3   sacubitril -valsartan  (ENTRESTO ) 24-26 MG Take 1 tablet by mouth 2 (two) times daily. 60 tablet 6   Semaglutide , 2 MG/DOSE, 8 MG/3ML SOPN Inject 2 mg into the skin once a week. (Patient not taking: Reported on 02/09/2024)     No current facility-administered medications on file prior to visit.     Review of Systems  Constitutional:  Negative for fever.  Respiratory:  Negative for cough, shortness of breath and wheezing.   Cardiovascular:  Negative for chest pain, palpitations and leg swelling.  Neurological:  Positive for dizziness (occ with standing quick). Negative for light-headedness and headaches.       Objective:   Vitals:   02/09/24 1041  BP: (!) 140/68  Pulse: 72  Temp: 98.3 F (36.8 C)  SpO2: 97%   BP Readings from Last 3 Encounters:  02/09/24 (!) 140/68  01/19/24 110/60  11/10/23 (!) 140/70   Wt Readings from Last 3 Encounters:  02/09/24 207 lb (93.9 kg)  01/19/24 208 lb 3.2 oz (94.4 kg)  11/10/23 201 lb (91.2 kg)   Body mass index is 29.08 kg/m.    Physical Exam Constitutional:      General: He is not in acute distress.    Appearance: Normal appearance. He is not ill-appearing.  HENT:     Head: Normocephalic and atraumatic.  Eyes:     Conjunctiva/sclera: Conjunctivae normal.  Cardiovascular:     Rate and Rhythm: Normal rate and regular rhythm.     Heart sounds: Normal heart sounds.  Pulmonary:     Effort: Pulmonary effort is normal. No respiratory distress.     Breath sounds: Normal breath sounds. No wheezing or rales.  Musculoskeletal:     Right lower leg: No edema.     Left lower leg: No edema.  Skin:    General: Skin is warm and dry.     Findings: No rash.   Neurological:     Mental Status: He is alert. Mental status is at baseline.  Psychiatric:        Mood and Affect: Mood normal.        Lab Results  Component Value Date   WBC 4.4 11/10/2023   HGB 12.8 (L) 11/10/2023   HCT 37.4 (L) 11/10/2023   PLT 120.0 (L) 11/10/2023   GLUCOSE 184 (H) 11/10/2023   CHOL 152 06/28/2023   TRIG 193.0 (H) 06/28/2023   HDL 53.10 06/28/2023   LDLDIRECT 65.0 01/30/2023   LDLCALC 60 06/28/2023   ALT 13 11/10/2023   AST 18 11/10/2023   NA 136 11/10/2023   K 4.0 11/10/2023   CL 102 11/10/2023   CREATININE 2.10 (H) 11/10/2023   BUN 32 (H) 11/10/2023   CO2 23 11/10/2023   TSH 1.65 06/28/2023   PSA 4.43 (H) 05/26/2016   INR 1.06 10/07/2009   HGBA1C 9.1 (H) 06/28/2023   MICROALBUR 64.3 (H) 01/30/2023     Assessment & Plan:    See Problem List for Assessment and Plan of chronic medical problems.

## 2024-02-09 ENCOUNTER — Ambulatory Visit: Payer: Self-pay | Admitting: Internal Medicine

## 2024-02-09 ENCOUNTER — Encounter: Payer: Self-pay | Admitting: Internal Medicine

## 2024-02-09 ENCOUNTER — Ambulatory Visit: Admitting: Internal Medicine

## 2024-02-09 VITALS — BP 140/68 | HR 72 | Temp 98.3°F | Ht 70.75 in | Wt 207.0 lb

## 2024-02-09 DIAGNOSIS — E782 Mixed hyperlipidemia: Secondary | ICD-10-CM

## 2024-02-09 DIAGNOSIS — I25118 Atherosclerotic heart disease of native coronary artery with other forms of angina pectoris: Secondary | ICD-10-CM

## 2024-02-09 DIAGNOSIS — N1832 Chronic kidney disease, stage 3b: Secondary | ICD-10-CM | POA: Diagnosis not present

## 2024-02-09 DIAGNOSIS — I25119 Atherosclerotic heart disease of native coronary artery with unspecified angina pectoris: Secondary | ICD-10-CM

## 2024-02-09 DIAGNOSIS — E1122 Type 2 diabetes mellitus with diabetic chronic kidney disease: Secondary | ICD-10-CM

## 2024-02-09 DIAGNOSIS — I5022 Chronic systolic (congestive) heart failure: Secondary | ICD-10-CM

## 2024-02-09 DIAGNOSIS — I10 Essential (primary) hypertension: Secondary | ICD-10-CM | POA: Diagnosis not present

## 2024-02-09 DIAGNOSIS — K219 Gastro-esophageal reflux disease without esophagitis: Secondary | ICD-10-CM | POA: Diagnosis not present

## 2024-02-09 DIAGNOSIS — E1142 Type 2 diabetes mellitus with diabetic polyneuropathy: Secondary | ICD-10-CM | POA: Diagnosis not present

## 2024-02-09 DIAGNOSIS — Z794 Long term (current) use of insulin: Secondary | ICD-10-CM

## 2024-02-09 DIAGNOSIS — E1140 Type 2 diabetes mellitus with diabetic neuropathy, unspecified: Secondary | ICD-10-CM | POA: Insufficient documentation

## 2024-02-09 DIAGNOSIS — I5042 Chronic combined systolic (congestive) and diastolic (congestive) heart failure: Secondary | ICD-10-CM

## 2024-02-09 LAB — CBC WITH DIFFERENTIAL/PLATELET
Basophils Absolute: 0 10*3/uL (ref 0.0–0.1)
Basophils Relative: 0.7 % (ref 0.0–3.0)
Eosinophils Absolute: 0.1 10*3/uL (ref 0.0–0.7)
Eosinophils Relative: 2.5 % (ref 0.0–5.0)
HCT: 30.6 % — ABNORMAL LOW (ref 39.0–52.0)
Hemoglobin: 10.5 g/dL — ABNORMAL LOW (ref 13.0–17.0)
Lymphocytes Relative: 14.6 % (ref 12.0–46.0)
Lymphs Abs: 0.7 10*3/uL (ref 0.7–4.0)
MCHC: 34.5 g/dL (ref 30.0–36.0)
MCV: 87.6 fl (ref 78.0–100.0)
Monocytes Absolute: 0.4 10*3/uL (ref 0.1–1.0)
Monocytes Relative: 8 % (ref 3.0–12.0)
Neutro Abs: 3.7 10*3/uL (ref 1.4–7.7)
Neutrophils Relative %: 74.2 % (ref 43.0–77.0)
Platelets: 140 10*3/uL — ABNORMAL LOW (ref 150.0–400.0)
RBC: 3.49 Mil/uL — ABNORMAL LOW (ref 4.22–5.81)
RDW: 14.5 % (ref 11.5–15.5)
WBC: 5 10*3/uL (ref 4.0–10.5)

## 2024-02-09 LAB — MICROALBUMIN / CREATININE URINE RATIO
Creatinine,U: 55 mg/dL
Microalb Creat Ratio: 626.2 mg/g — ABNORMAL HIGH (ref 0.0–30.0)
Microalb, Ur: 34.4 mg/dL — ABNORMAL HIGH (ref 0.0–1.9)

## 2024-02-09 LAB — COMPREHENSIVE METABOLIC PANEL WITH GFR
ALT: 14 U/L (ref 0–53)
AST: 13 U/L (ref 0–37)
Albumin: 3.9 g/dL (ref 3.5–5.2)
Alkaline Phosphatase: 63 U/L (ref 39–117)
BUN: 40 mg/dL — ABNORMAL HIGH (ref 6–23)
CO2: 24 meq/L (ref 19–32)
Calcium: 9.6 mg/dL (ref 8.4–10.5)
Chloride: 95 meq/L — ABNORMAL LOW (ref 96–112)
Creatinine, Ser: 2.04 mg/dL — ABNORMAL HIGH (ref 0.40–1.50)
GFR: 31.71 mL/min — ABNORMAL LOW (ref >60.00)
Glucose, Bld: 426 mg/dL — ABNORMAL HIGH (ref 70–99)
Potassium: 4.8 meq/L (ref 3.5–5.1)
Sodium: 128 meq/L — ABNORMAL LOW (ref 135–145)
Total Bilirubin: 1 mg/dL (ref 0.2–1.2)
Total Protein: 6.6 g/dL (ref 6.0–8.3)

## 2024-02-09 LAB — LIPID PANEL
Cholesterol: 196 mg/dL (ref 0–200)
HDL: 46.3 mg/dL (ref >39.00)
LDL Cholesterol: 100 mg/dL — ABNORMAL HIGH (ref 0–99)
NonHDL: 149.77
Total CHOL/HDL Ratio: 4
Triglycerides: 248 mg/dL — ABNORMAL HIGH (ref 0.0–149.0)
VLDL: 49.6 mg/dL — ABNORMAL HIGH (ref 0.0–40.0)

## 2024-02-09 LAB — HEMOGLOBIN A1C: Hgb A1c MFr Bld: 11.5 % — ABNORMAL HIGH (ref 4.6–6.5)

## 2024-02-09 MED ORDER — ISOSORBIDE MONONITRATE ER 120 MG PO TB24: 120.0000 mg | ORAL_TABLET | Freq: Every day | ORAL | 1 refills | Status: AC

## 2024-02-09 MED ORDER — GABAPENTIN 100 MG PO CAPS: 400.0000 mg | ORAL_CAPSULE | Freq: Every day | ORAL | 1 refills | Status: DC

## 2024-02-09 MED ORDER — METOPROLOL SUCCINATE ER 50 MG PO TB24
50.0000 mg | ORAL_TABLET | Freq: Every day | ORAL | 3 refills | Status: DC
Start: 2024-02-09 — End: 2024-06-10

## 2024-02-09 NOTE — Assessment & Plan Note (Signed)
Chronic CMP

## 2024-02-09 NOTE — Assessment & Plan Note (Signed)
 Chronic Currently taking gabapentin  300 mg at night This helps, but not enough Discussed that we can increase gabapentin  up to 600 mg if needed at night-increase slowly Advised using a walking stick

## 2024-02-09 NOTE — Assessment & Plan Note (Addendum)
 Chronic Has PCP in [place]  who has been prescribing his medications Has been losing weight Check A1c, urine microalbumin Continue insulin  30 units twice daily, Ozempic  2 mg weekly-new prescription was sent to pharmacy for 0.25 mg which was likely a mistake by his doctor in [place] -advised that he call them to discuss

## 2024-02-09 NOTE — Assessment & Plan Note (Signed)
 Chronic Controlled CMP, CBC Continue Imdur  120 mg daily, metoprolol  XL 50 mg daily, Entresto  24-26 mg twice daily

## 2024-02-09 NOTE — Assessment & Plan Note (Addendum)
 Chronic Following with cardiology, but has not seen them in a while-advised follow-up Last EF 20-25% Appears euvolemic metoprolol  XL 50 mg daily, Entresto  24-26 mg twice daily

## 2024-02-09 NOTE — Assessment & Plan Note (Signed)
 Chronic Following with GI in [place]  Continue pantoprazole  40 mg daily

## 2024-02-09 NOTE — Assessment & Plan Note (Addendum)
 Chronic Following with cardiology-needs to make a follow-up appointment Has decreased stamina, DOE He has been walking in a little bit-advised that he start using a walking stick to see if that helps with his balance and enables him to walk further

## 2024-02-09 NOTE — Assessment & Plan Note (Signed)
Chronic Check lipid panel  Continue Zetia 10 mg daily, Crestor 40 mg daily

## 2024-02-13 ENCOUNTER — Telehealth

## 2024-02-13 MED ORDER — SEMAGLUTIDE (2 MG/DOSE) 8 MG/3ML SC SOPN
8 | SUBCUTANEOUS | 1 refills | 30.00 days | Status: AC
Start: 2024-02-13 — End: ?

## 2024-02-13 NOTE — Telephone Encounter (Signed)
 Patient called in stating that the wrong script was sent in for him to his pharmacy for his ozempic . Patient said it should be 2.0 not what he currently has. Pt is in [place] and is requesting a callback.

## 2024-02-13 NOTE — Telephone Encounter (Signed)
 Returned call to patient.  Requested return call

## 2024-02-13 NOTE — Telephone Encounter (Signed)
 Left message with patient that appointment is needed for future refills. Reviewed message from Dr. Elby Green.

## 2024-02-13 NOTE — Telephone Encounter (Signed)
 Spoke with patient, do see previous script for Ozempic  2mg  per dose. Will send refill. Patient advised that he will need appointment to continue receiving refills LOV 09/27/2022. States that he is caring for sick friend in [place], difficult to travel away from friend at this time. Please advise if patient can schedule Virtual Visit.

## 2024-02-15 ENCOUNTER — Encounter

## 2024-02-16 ENCOUNTER — Other Ambulatory Visit: Payer: Self-pay | Admitting: Internal Medicine

## 2024-02-16 DIAGNOSIS — I25118 Atherosclerotic heart disease of native coronary artery with other forms of angina pectoris: Secondary | ICD-10-CM

## 2024-02-16 DIAGNOSIS — E785 Hyperlipidemia, unspecified: Secondary | ICD-10-CM

## 2024-02-16 MED ORDER — ROSUVASTATIN CALCIUM 40 MG PO TABS: 40.0000 mg | ORAL_TABLET | Freq: Every day | ORAL | 3 refills | Status: AC

## 2024-02-16 NOTE — Telephone Encounter (Signed)
 Copied from CRM [id number]. Topic: Clinical - Medication Refill >> Feb 16, 2024  1:13 PM Tanazia G wrote: Medication:  rosuvastatin  (CRESTOR ) 40 MG tablet   Has the patient contacted their pharmacy? Yes (Agent: If no, request that the patient contact the pharmacy for the refill. If patient does not wish to contact the pharmacy document the reason why and proceed with request.) (Agent: If yes, when and what did the pharmacy advise?)  This is the patient's preferred pharmacy:  [id number] - [place], [place] - [address] [place] [address] [place] [address] Phone: [phone] Fax: [phone]  Is this the correct pharmacy for this prescription? Yes If no, delete pharmacy and type the correct one.   Has the prescription been filled recently? Yes  Is the patient out of the medication? Yes  Has the patient been seen for an appointment in the last year OR does the patient have an upcoming appointment? Yes  Can we respond through MyChart? Yes  Agent: Please be advised that Rx refills may take up to 3 business days. We ask that you follow-up with your pharmacy.

## 2024-02-16 NOTE — Telephone Encounter (Signed)
 Pt states he is unsure of the name of his cholesterol medication, but requested a refill of rosuvastatin . RN called pt to discuss his medications. Per chart review, pt should be taking both rosuvastatin  and Zetia . Pt did not answer. RN LVM with a CB number.

## 2024-02-22 ENCOUNTER — Other Ambulatory Visit: Payer: Self-pay | Admitting: Internal Medicine

## 2024-02-22 NOTE — Telephone Encounter (Signed)
 Copied from CRM [id number]. Topic: Clinical - Medication Refill >> Feb 22, 2024  4:37 PM Felizardo Hotter wrote: Medication: ezetimibe  (ZETIA ) 10 MG tablet  Has the patient contacted their pharmacy? Yes (Agent: If no, request that the patient contact the pharmacy for the refill. If patient does not wish to contact the pharmacy document the reason why and proceed with request.) (Agent: If yes, when and what did the pharmacy advise?)  This is the patient's preferred pharmacy:  [id number] - [place], [place] - [address] AT [facility] [address] [place] [address] Phone: [phone] Fax: [phone]  Is this the correct pharmacy for this prescription? Yes If no, delete pharmacy and type the correct one.   Has the prescription been filled recently? Yes  Is the patient out of the medication? Yes  Has the patient been seen for an appointment in the last year OR does the patient have an upcoming appointment? Yes  Can we respond through MyChart? Yes  Agent: Please be advised that Rx refills may take up to 3 business days. We ask that you follow-up with your pharmacy.

## 2024-02-23 MED ORDER — EZETIMIBE 10 MG PO TABS: 10.0000 mg | ORAL_TABLET | Freq: Every day | ORAL | 3 refills | Status: AC

## 2024-03-13 ENCOUNTER — Other Ambulatory Visit: Payer: Self-pay | Admitting: Internal Medicine

## 2024-03-13 MED ORDER — GABAPENTIN 100 MG PO CAPS: 400.0000 mg | ORAL_CAPSULE | Freq: Every day | ORAL | 1 refills | Status: DC

## 2024-03-13 NOTE — Telephone Encounter (Signed)
 Copied from CRM [id number]. Topic: Clinical - Medication Refill >> Mar 13, 2024 10:17 AM Jalayah J wrote: Medication: gabapentin  (NEURONTIN ) 100 MG capsule (please increase dosage per pt)  Has the patient contacted their pharmacy? Yes (Agent: If no, request that the patient contact the pharmacy for the refill. If patient does not wish to contact the pharmacy document the reason why and proceed with request.) (Agent: If yes, when and what did the pharmacy advise?)  This is the patient's preferred pharmacy:  [id number] - [place], [place] - [address] [facility] [address] [facility] [address] [facility] [address] [place] [address] Phone: [phone] Fax: [phone]    Is this the correct pharmacy for this prescription? Yes If no, delete pharmacy and type the correct one.   Has the prescription been filled recently? No  Is the patient out of the medication? Yes  Has the patient been seen for an appointment in the last year OR does the patient have an upcoming appointment? Yes  Can we respond through MyChart? Yes  Agent: Please be advised that Rx refills may take up to 3 business days. We ask that you follow-up with your pharmacy.

## 2024-04-17 ENCOUNTER — Other Ambulatory Visit: Payer: Self-pay | Admitting: Internal Medicine

## 2024-04-19 ENCOUNTER — Ambulatory Visit: Admitting: Physician Assistant

## 2024-05-06 ENCOUNTER — Encounter (HOSPITAL_BASED_OUTPATIENT_CLINIC_OR_DEPARTMENT_OTHER): Payer: Self-pay

## 2024-05-07 ENCOUNTER — Ambulatory Visit (HOSPITAL_BASED_OUTPATIENT_CLINIC_OR_DEPARTMENT_OTHER): Admitting: Family

## 2024-05-08 DIAGNOSIS — K219 Gastro-esophageal reflux disease without esophagitis: Secondary | ICD-10-CM | POA: Diagnosis not present

## 2024-05-08 DIAGNOSIS — I2510 Atherosclerotic heart disease of native coronary artery without angina pectoris: Secondary | ICD-10-CM | POA: Diagnosis not present

## 2024-05-08 DIAGNOSIS — R112 Nausea with vomiting, unspecified: Secondary | ICD-10-CM | POA: Diagnosis not present

## 2024-05-08 DIAGNOSIS — Z860101 Personal history of adenomatous and serrated colon polyps: Secondary | ICD-10-CM | POA: Diagnosis not present

## 2024-05-08 DIAGNOSIS — K3184 Gastroparesis: Secondary | ICD-10-CM | POA: Diagnosis not present

## 2024-05-09 ENCOUNTER — Telehealth: Payer: Self-pay

## 2024-05-09 NOTE — Telephone Encounter (Signed)
   Name: BASHEER MOLCHAN  DOB: Feb 25, 1951  MRN: [id number]  Primary Cardiologist: Shelda Bruckner, MD  Chart reviewed as part of pre-operative protocol coverage. The patient has an upcoming visit scheduled with Reche Finder, NP on 06/10/2024 at which time clearance can be addressed in case there are any issues that would impact surgical recommendations.   I added preop FYI to appointment note so that provider is aware to address at time of outpatient visit.  Per office protocol the cardiology provider should forward their finalized clearance decision and recommendations regarding antiplatelet therapy to the requesting party below.    I will route this message as FYI to requesting party and remove this message from the preop box as separate preop APP input not needed at this time.   Please call with any questions.  Wyn Raddle, Jackee Shove, NP  05/09/2024, 11:01 AM

## 2024-05-09 NOTE — Telephone Encounter (Signed)
   Pre-operative Risk Assessment    Patient Name: Russell Trevino  DOB: [birth year] MRN: [id number]   Date of last office visit: 07/05/23 DAMIEN BRAVER, NP Date of next office visit: 06/10/24 CAITLIN WALKER, NP   Request for Surgical Clearance    Procedure:  EGD AND COLONOSCOPY  Date of Surgery:  Clearance TBD                                Surgeon:  DR TITA HEINRICH Surgeon's Group or Practice Name:  [facility] Phone number:  [phone] Fax number:  [phone]   Type of Clearance Requested:   - Medical  - Pharmacy:  Hold Clopidogrel  (Plavix ) 5 DAYS PRIOR   Type of Anesthesia:  Not Indicated   Additional requests/questions:    Signed, Lucie DELENA Ku   05/09/2024, 10:18 AM

## 2024-05-28 ENCOUNTER — Encounter

## 2024-05-30 ENCOUNTER — Other Ambulatory Visit: Payer: Self-pay | Admitting: Internal Medicine

## 2024-05-30 MED ORDER — CLOPIDOGREL BISULFATE 75 MG PO TABS: 75.0000 mg | ORAL_TABLET | Freq: Every day | ORAL | 2 refills | Status: DC

## 2024-05-30 NOTE — Telephone Encounter (Signed)
 Copied from CRM [id number]. Topic: Clinical - Medication Refill >> May 30, 2024 11:21 AM Martinique E wrote: Medication: clopidogrel  (PLAVIX ) 75 MG table  Has the patient contacted their pharmacy? Yes (Agent: If no, request that the patient contact the pharmacy for the refill. If patient does not wish to contact the pharmacy document the reason why and proceed with request.) (Agent: If yes, when and what did the pharmacy advise?)  This is the patient's preferred pharmacy:  [id number] - [place], [place] - [address] AT [facility] [address] [facility] [address]  [place] [address] Phone: [phone] Fax: [phone]   Is this the correct pharmacy for this prescription? Yes If no, delete pharmacy and type the correct one.   Has the prescription been filled recently? No  Is the patient out of the medication? Yes, patient stated this medication used to be filled by a different provider, but that provider is no longer with the practice.  Has the patient been seen for an appointment in the last year OR does the patient have an upcoming appointment? Yes  Can we respond through MyChart? Yes  Agent: Please be advised that Rx refills may take up to 3 business days. We ask that you follow-up with your pharmacy.

## 2024-06-04 ENCOUNTER — Ambulatory Visit (HOSPITAL_BASED_OUTPATIENT_CLINIC_OR_DEPARTMENT_OTHER): Admitting: Family

## 2024-06-10 ENCOUNTER — Ambulatory Visit (HOSPITAL_BASED_OUTPATIENT_CLINIC_OR_DEPARTMENT_OTHER): Admitting: Family

## 2024-06-10 ENCOUNTER — Other Ambulatory Visit: Payer: Self-pay

## 2024-06-10 ENCOUNTER — Other Ambulatory Visit (HOSPITAL_BASED_OUTPATIENT_CLINIC_OR_DEPARTMENT_OTHER): Payer: Self-pay

## 2024-06-10 ENCOUNTER — Encounter (HOSPITAL_BASED_OUTPATIENT_CLINIC_OR_DEPARTMENT_OTHER): Payer: Self-pay | Admitting: *Deleted

## 2024-06-10 ENCOUNTER — Encounter (HOSPITAL_BASED_OUTPATIENT_CLINIC_OR_DEPARTMENT_OTHER): Payer: Self-pay | Admitting: Family

## 2024-06-10 VITALS — BP 160/76 | HR 53 | Ht 72.0 in | Wt 219.5 lb

## 2024-06-10 DIAGNOSIS — E1165 Type 2 diabetes mellitus with hyperglycemia: Secondary | ICD-10-CM

## 2024-06-10 DIAGNOSIS — I25119 Atherosclerotic heart disease of native coronary artery with unspecified angina pectoris: Secondary | ICD-10-CM

## 2024-06-10 DIAGNOSIS — E785 Hyperlipidemia, unspecified: Secondary | ICD-10-CM | POA: Diagnosis not present

## 2024-06-10 DIAGNOSIS — Z01818 Encounter for other preprocedural examination: Secondary | ICD-10-CM | POA: Diagnosis not present

## 2024-06-10 DIAGNOSIS — I5042 Chronic combined systolic (congestive) and diastolic (congestive) heart failure: Secondary | ICD-10-CM | POA: Diagnosis not present

## 2024-06-10 DIAGNOSIS — I5022 Chronic systolic (congestive) heart failure: Secondary | ICD-10-CM

## 2024-06-10 DIAGNOSIS — Z794 Long term (current) use of insulin: Secondary | ICD-10-CM

## 2024-06-10 MED ORDER — METOPROLOL SUCCINATE ER 50 MG PO TB24
50.0000 mg | ORAL_TABLET | Freq: Every day | ORAL | 0 refills | Status: DC
  Filled 2024-06-10: qty 90, 90d supply, fill #0

## 2024-06-10 MED ORDER — RANOLAZINE ER 500 MG PO TB12
500.0000 mg | ORAL_TABLET | Freq: Two times a day (BID) | ORAL | 1 refills | Status: AC
  Filled 2024-06-10: qty 180, 90d supply, fill #0

## 2024-06-10 MED ORDER — METOPROLOL SUCCINATE ER 25 MG PO TB24
25.0000 mg | ORAL_TABLET | Freq: Every day | ORAL | 1 refills | Status: AC
  Filled 2024-06-10: qty 90, 90d supply, fill #0

## 2024-06-10 MED ORDER — NITROGLYCERIN 0.4 MG SL SUBL
SUBLINGUAL_TABLET | SUBLINGUAL | 0 refills | Status: AC
  Filled 2024-06-10: qty 25, 8d supply, fill #0

## 2024-06-10 NOTE — Patient Instructions (Addendum)
 Medication Instructions:  RESUME Metoprolol  25mg  once per day  RESUME Ranexa  500mg  twice per day  These both help the heart to work more efficiently, prevent chest pain  Your cholesterol was elevated on labs in May, be sure you are taking both Rosuvastatin  (Crestor ) and Ezetimibe  (Zetia )  *If you need a refill on your cardiac medications before your next appointment, please call your pharmacy*  Testing/Procedures: Your provider has recommended a lexiscan myoview  prior to your endoscopy, colonoscopy.  Follow-Up: At [facility], you and your health needs are our priority.  As part of our continuing mission to provide you with exceptional heart care, our providers are all part of one team.  This team includes your primary Cardiologist (physician) and Advanced Practice Providers or APPs (Physician Assistants and Nurse Practitioners) who all work together to provide you with the care you need, when you need it.  Your next appointment:   2 month(s)  Provider:   Shelda Bruckner, MD, Rosaline Bane, NP, or Reche Finder, NP   We recommend signing up for the patient portal called MyChart.  Sign up information is provided on this After Visit Summary.  MyChart is used to connect with patients for Virtual Visits (Telemedicine).  Patients are able to view lab/test results, encounter notes, upcoming appointments, etc.  Non-urgent messages can be sent to your provider as well.   To learn more about what you can do with MyChart, go to [email or web address].   Other Instructions  To prevent or reduce lower extremity swelling: Eat a low salt diet. Salt makes the body hold onto extra fluid which causes swelling. Sit with legs elevated. For example, in the recliner or on an ottoman.  Wear knee-high compression stockings during the daytime. Ones labeled 15-20 mmHg provide good compression.

## 2024-06-10 NOTE — Progress Notes (Signed)
 Cardiology Office Note   Date:  06/10/2024  ID:  [name], Bartow [birth year], MRN [id number] PCP: Geofm Glade PARAS, MD  [facility] Providers Cardiologist:  Shelda Bruckner, MD     History of Present Illness Russell Trevino is a 73 y.o. male with a hx of CAD s/p CABG 04/2005, ischemic cardiomyopathy, HFrEF, hypertension, hyperlipidemia, DM 2, GERD, CKD 3B.   Admitted 12/16/2021 after presenting with chest pain.  It was unclear which of his medications he was or was not taking.  Echo 12/11/2021 LVEF 25% with septal and inferior wall motion abnormalities worse than previous.  He underwent LHC showing native left main occlusion with patent arterial graft. Medical management recommended. Plan was to potentially add ARNI in outpatient setting.  SGLT2 was discontinued to allow for starting of Ozempic .  Has since moved to [place], [place] but maintained healthcare in [place].  Presents today for preoperative clearance for EGD and colonoscopy with request to hold Plavix  5 days prior.  He notes at night when trying to sleep he has some hard breathing. This is relieved by taking a PRN nitroglycerin . This is occurring a couple nights per week and started happening a couple months ago. Notes this sensation wakes him up right before he is going to fall asleep. No exertional chest pain nor dyspnea, no palpitations. He has not been checking blood pressure at home. Reports blood sugar at home has been pretty high. He is not taking Ozempic . Reports outside provider told him the Ozempic  was making it harder for him to digest food which he already has difficulties with. He is seeing GI, Dr. Legrand, 06/13/24. Plans to inquire with GI whether safe to resume. He also notes he often takes nitroglycerin  for his stomach issues with improvement in symptoms. Requests refill for 100 tablets, discussed office standard for 25 tablets with refills. He reports he is not taking Metoprolol . He is uncertain if he is  taking Ranexa . Reports more difficulty with neuropathy and balance x 1 month. Also notes some bilateral lower extremity which is worse by end of the day. Does improve with elevation.  ROS: Please see the history of present illness.    All other systems reviewed and are negative.   Studies Reviewed EKG Interpretation Date/Time:  Monday June 10 2024 13:35:42 EDT Ventricular Rate:  53 PR Interval:  176 QRS Duration:  126 QT Interval:  488 QTC Calculation: 457 R Axis:   -2  Text Interpretation: Sinus bradycardia  Stable lateral TWI Confirmed by Vannie Mora ([id number]) on 06/10/2024 1:50:20 PM    Cardiac Studies & Procedures   ______________________________________________________________________________________________ CARDIAC CATHETERIZATION  CARDIAC CATHETERIZATION 12/17/2021  Conclusion   Mid LM to Dist LM lesion is 100% stenosed.   Prox LAD lesion is 100% stenosed.   Prox Cx lesion is 100% stenosed.   Prox RCA to Dist RCA lesion is 100% stenosed.   Dist LAD lesion is 60% stenosed.   LV end diastolic pressure is normal.  1.  Patent LIMA to LAD with radial off the LIMA graft to diagonal and obtuse marginal branches. 2.  Vein graft right coronary artery known occluded and not imaged. 3.  Severe native vessel disease with known occlusion of right coronary artery which was not imaged on the study and newly occluded left main lesion. 4.  LVEDP of 16 mmHg.  The results were reviewed with Dr. Santo and optimal medical therapy will be pursued.  Findings Coronary Findings Diagnostic  Dominance: Right  Left Main Mid LM  to Dist LM lesion is 100% stenosed.  Left Anterior Descending Prox LAD lesion is 100% stenosed. Dist LAD lesion is 60% stenosed.  Left Circumflex Prox Cx lesion is 100% stenosed.  Right Coronary Artery Prox RCA to Dist RCA lesion is 100% stenosed.  Acute Marginal Branch Collaterals Acute Mrg filled by collaterals from Prox RCA.  Right Posterior  Descending Artery Collaterals RPDA filled by collaterals from 3rd Sept.  LIMA Graft To Mid LAD  Sequential Graft To 1st Diag, 3rd Mrg  Intervention  No interventions have been documented.   CARDIAC CATHETERIZATION  CARDIAC CATHETERIZATION 10/26/2018  Conclusion  Mid LM to Dist LM lesion is 95% stenosed.  Prox LAD lesion is 100% stenosed.  Prox Cx lesion is 100% stenosed.  Prox RCA to Dist RCA lesion is 100% stenosed.  LV end diastolic pressure is normal.  Hemodynamic findings consistent with mild pulmonary hypertension.  1. Occlusive native vessel CAD. -95% distal left main - 100% proximal LAD after the first septal perforator - 100% LCx after a very small OM1. - 100% proximal RCA 2. Patent LIMA to the LAD 3. Patent free radial graft arising from the mid LIMA graft and supplying the first diagonal and OM 4. SVG to RCA is known to be occluded. The distal RCA is supplied by collaterals. 5. Normal LV filling pressures 6. Mild pulmonary HTN. 7. Preserved cardiac output.  Plan: continue medical therapy for CHF. Will switch IV lasix  to po. Stop IV Ntg and heparin .  Findings Coronary Findings Diagnostic  Dominance: Right  Left Main Mid LM to Dist LM lesion is 95% stenosed.  Left Anterior Descending Prox LAD lesion is 100% stenosed.  Left Circumflex Prox Cx lesion is 100% stenosed.  Right Coronary Artery Prox RCA to Dist RCA lesion is 100% stenosed.  Acute Marginal Branch Collaterals Acute Mrg filled by collaterals from Prox RCA.  Right Posterior Descending Artery Collaterals RPDA filled by collaterals from 3rd Sept.  LIMA Graft To Mid LAD  Sequential Graft To 1st Diag, 3rd Mrg  Intervention  No interventions have been documented.     ECHOCARDIOGRAM  ECHOCARDIOGRAM COMPLETE 12/17/2021  Narrative ECHOCARDIOGRAM REPORT    Patient Name:   Russell Trevino Date of Exam: 12/17/2021 Medical Rec #:  [id number]       Height:       72.0 in Accession  #:    [id number]      Weight:       235.0 lb Date of Birth:  [birth year]      BSA:          2.282 m Patient Age:    70 years        BP:           144/71 mmHg Patient Gender: M               HR:           73 bpm. Exam Location:  Inpatient  Procedure: 2D Echo and Intracardiac Opacification Agent  Indications:    Chest pain  History:        Patient has prior history of Echocardiogram examinations, most recent 10/26/2018. Cardiomyopathy and CHF, CAD and Previous Myocardial Infarction; Risk Factors:Diabetes.  Sonographer:    Harlene King Referring Phys: [phone] [facility]   Sonographer Comments: Image acquisition challenging due to patient body habitus. IMPRESSIONS   1. Left ventricular ejection fraction, by estimation, is 20 to 25%. The left ventricle has severely decreased function. The left ventricle demonstrates  regional wall motion abnormalities (see scoring diagram/findings for description). The left ventricular internal cavity size was moderately dilated. Left ventricular diastolic parameters are indeterminate. 2. Right ventricular systolic function is moderately reduced. The right ventricular size is normal. 3. Left atrial size was mildly dilated. 4. The mitral valve is grossly normal. Trivial mitral valve regurgitation. No evidence of mitral stenosis. 5. The aortic valve was not well visualized. Aortic valve regurgitation is not visualized. No aortic stenosis is present. 6. Aortic dilatation noted. There is mild dilatation of the ascending aorta, measuring 41 mm.  Comparison(s): Changes from prior study are noted.  Conclusion(s)/Recommendation(s): Wall motion better seen on current study. Diffuse hypokinesis with akinesis predominantly in the inferoseptal and inferior walls. No evidence of LV thrombus. Severely reduced LVEF, 20-25%.  FINDINGS Left Ventricle: Left ventricular ejection fraction, by estimation, is 20 to 25%. The left ventricle has severely decreased function.  The left ventricle demonstrates regional wall motion abnormalities. Definity  contrast agent was given IV to delineate the left ventricular endocardial borders. The left ventricular internal cavity size was moderately dilated. There is no left ventricular hypertrophy. Left ventricular diastolic parameters are indeterminate.   LV Wall Scoring: The mid and distal anterior septum, inferior septum, entire inferior wall, and apex are akinetic. The entire anterior wall, entire lateral wall, and basal anteroseptal segment are hypokinetic.  Right Ventricle: The right ventricular size is normal. Right vetricular wall thickness was not well visualized. Right ventricular systolic function is moderately reduced.  Left Atrium: Left atrial size was mildly dilated.  Right Atrium: Right atrial size was normal in size.  Pericardium: There is no evidence of pericardial effusion.  Mitral Valve: The mitral valve is grossly normal. Trivial mitral valve regurgitation. No evidence of mitral valve stenosis.  Tricuspid Valve: The tricuspid valve is grossly normal. Tricuspid valve regurgitation is trivial.  Aortic Valve: The aortic valve was not well visualized. Aortic valve regurgitation is not visualized. No aortic stenosis is present. Aortic valve mean gradient measures 2.0 mmHg. Aortic valve peak gradient measures 3.8 mmHg. Aortic valve area, by VTI measures 2.97 cm.  Pulmonic Valve: The pulmonic valve was not well visualized. Pulmonic valve regurgitation is trivial. No evidence of pulmonic stenosis.  Aorta: Aortic dilatation noted. There is mild dilatation of the ascending aorta, measuring 41 mm.  IAS/Shunts: The interatrial septum was not well visualized.   LEFT VENTRICLE PLAX 2D LVIDd:         5.90 cm   Diastology LVIDs:         5.30 cm   LV e' medial:    4.90 cm/s LV PW:         0.90 cm   LV E/e' medial:  11.1 LV IVS:        0.90 cm   LV e' lateral:   6.85 cm/s LVOT diam:     2.30 cm   LV E/e'  lateral: 7.9 LV SV:         53 LV SV Index:   23 LVOT Area:     4.15 cm   RIGHT VENTRICLE RV Basal diam:  3.40 cm RV Mid diam:    2.50 cm RV S prime:     6.74 cm/s TAPSE (M-mode): 1.1 cm  LEFT ATRIUM             Index        RIGHT ATRIUM           Index LA diam:        4.10 cm  1.80 cm/m   RA Area:     11.50 cm LA Vol (A2C):   44.7 ml 19.59 ml/m  RA Volume:   25.20 ml  11.04 ml/m LA Vol (A4C):   53.1 ml 23.27 ml/m LA Biplane Vol: 49.8 ml 21.83 ml/m AORTIC VALVE AV Area (Vmax):    2.89 cm AV Area (Vmean):   2.89 cm AV Area (VTI):     2.97 cm AV Vmax:           97.70 cm/s AV Vmean:          64.600 cm/s AV VTI:            0.179 m AV Peak Grad:      3.8 mmHg AV Mean Grad:      2.0 mmHg LVOT Vmax:         67.90 cm/s LVOT Vmean:        44.900 cm/s LVOT VTI:          0.128 m LVOT/AV VTI ratio: 0.72  AORTA Ao Root diam: 3.40 cm Ao Asc diam:  4.10 cm  MITRAL VALVE MV Area (PHT): 3.93 cm    SHUNTS MV Decel Time: 193 msec    Systemic VTI:  0.13 m MV E velocity: 54.40 cm/s  Systemic Diam: 2.30 cm MV A velocity: 96.40 cm/s MV E/A ratio:  0.56  Shelda Bruckner MD Electronically signed by Shelda Bruckner MD Signature Date/Time: 12/17/2021/10:50:26 AM    Final          ______________________________________________________________________________________________      Risk Assessment/Calculations   HYPERTENSION CONTROL Vitals:   06/10/24 1337 06/10/24 1400  BP: (!) 151/72 (!) 160/76    The patient's blood pressure is elevated above target today.  In order to address the patient's elevated BP: A current anti-hypertensive medication was adjusted today.      STOP-Bang Score:         Physical Exam VS:  BP (!) 160/76   Pulse (!) 53   Ht 6' (1.829 m)   Wt 219 lb 8 oz (99.6 kg)   SpO2 98%   BMI 29.77 kg/m   Vitals:   06/10/24 1337 06/10/24 1400  BP: (!) 151/72 (!) 160/76  Pulse: (!) 53   Height: 6' (1.829 m)   Weight: 219 lb 8 oz (99.6  kg)   SpO2: 98%   BMI (Calculated): 29.76          Wt Readings from Last 3 Encounters:  06/10/24 219 lb 8 oz (99.6 kg)  02/09/24 207 lb (93.9 kg)  01/19/24 208 lb 3.2 oz (94.4 kg)     GEN: Well nourished, well developed in no acute distress NECK: No JVD; No carotid bruits CARDIAC: RRR, no murmurs, rubs, gallops RESPIRATORY:  Clear to auscultation without rales, wheezing or rhonchi  ABDOMEN: Soft, non-tender, non-distended EXTREMITIES:  No edema; No deformity   ASSESSMENT AND PLAN  Preop - Clearance not provided today due to need for lexiscan myoview, as above. Will route to requesting provider so they are aware.   CAD - Reports episode of dyspnea at night waking him from sleep concerning for unstable angina relieved by nitroglycerin .  Plan for lexiscan myoview. He has risk factors for progressive of CAD including uncontrolled diabetes, hypertension, uncontrolled hyperlipidemia.  GDMT plavix  75mg  daily, zetia  10mg  daily, rosuvastatin  40mg  daily, imdur  120mg  daily. He does not think he is taking ranexa  500mg  BID, will resume. He does not think he is taking Metoprolol , given bradycardia suspect he is taking  but unaware and will reduce dose to Toprol  25mg  daily.  EKG today with stable lateral TWI.   HLD, LDL goal <70 - 02/02/24 LDL 100, triglycerides 248. Likely non-fasting lab. Notes nonadherence to Rosuvastatin , encouraged him to check pill bottles at home. Readdress at follow up.   DM2 - 02/09/24 A1c 11.5. no longer on Ozempic , plans to discuss if okay to resume with his GI provider. Encouraged to follow up with primary care for optimization of diabetes. Encouraged to consider seeing endocrinology.   HTN - BP not at goal. Resume Toprol , Ranexa  as above. Discussed to monitor BP at home at least 2 hours after medications and sitting for 5-10 minutes.   Medication management - care fragmented by receiving care in [facility] and [place]. Confusion regarding medications, unsure if he is taking Ranexa  or  Metoprolol  - refills provided. Requested he bring pill bottles to next appointment.    Informed Consent   Shared Decision Making/Informed Consent The risks [chest pain, shortness of breath, cardiac arrhythmias, dizziness, blood pressure fluctuations, myocardial infarction, stroke/transient ischemic attack, nausea, vomiting, allergic reaction, radiation exposure, metallic taste sensation and life-threatening complications (estimated to be 1 in 10,000)], benefits (risk stratification, diagnosing coronary artery disease, treatment guidance) and alternatives of a nuclear stress test were discussed in detail with Mr. Hayner and he agrees to proceed.     Dispo: follow up in 2-3 months  Signed, Reche GORMAN Finder, NP

## 2024-06-13 ENCOUNTER — Encounter: Payer: Self-pay | Admitting: Gastroenterology

## 2024-06-13 ENCOUNTER — Ambulatory Visit: Admitting: Gastroenterology

## 2024-06-13 VITALS — BP 116/50 | HR 82 | Ht 72.0 in | Wt 221.2 lb

## 2024-06-13 DIAGNOSIS — R112 Nausea with vomiting, unspecified: Secondary | ICD-10-CM | POA: Diagnosis not present

## 2024-06-13 DIAGNOSIS — R6881 Early satiety: Secondary | ICD-10-CM

## 2024-06-13 DIAGNOSIS — E1143 Type 2 diabetes mellitus with diabetic autonomic (poly)neuropathy: Secondary | ICD-10-CM

## 2024-06-13 DIAGNOSIS — K3184 Gastroparesis: Secondary | ICD-10-CM

## 2024-06-13 NOTE — Patient Instructions (Signed)
 _______________________________________________________  If your blood pressure at your visit was 140/90 or greater, please contact your primary care physician to follow up on this.  _______________________________________________________  If you are age 74 or older, your body mass index should be between 23-30. Your Body mass index is 30.01 kg/m. If this is out of the aforementioned range listed, please consider follow up with your Primary Care Provider.  If you are age 66 or younger, your body mass index should be between 19-25. Your Body mass index is 30.01 kg/m. If this is out of the aformentioned range listed, please consider follow up with your Primary Care Provider.   ________________________________________________________  The [facility] providers would like to encourage you to use MYCHART to communicate with providers for non-urgent requests or questions.  Due to long hold times on the telephone, sending your provider a message by [facility] may be a faster and more efficient way to get a response.  Please allow 48 business hours for a response.  Please remember that this is for non-urgent requests.  _______________________________________________________  [facility] is using a team-based approach to care.  Your team is made up of your doctor and two to three APPS. Our APPS (Nurse Practitioners and Physician Assistants) work with your physician to ensure care continuity for you. They are fully qualified to address your health concerns and develop a treatment plan. They communicate directly with your gastroenterologist to care for you. Seeing the Advanced Practice Practitioners on your physician's team can help you by facilitating care more promptly, often allowing for earlier appointments, access to diagnostic testing, procedures, and other specialty referrals.    Thank you for trusting me with your gastrointestinal care!    Dr. Victory Legrand DOUGLAS [facility]

## 2024-06-13 NOTE — Progress Notes (Signed)
 [facility] Consult Note:  History: Russell Trevino 06/13/2024  Referring provider: Geofm Glade PARAS, MD  Reason for consult/chief complaint: Abdominal Pain (Pt states food is not digesting well, pt can't sleep )  Prior GI history  From my May 2020 telemedicine note This patient was seen in office consultation with Russell Corti, PA on 10/24/2018 for chest pain.  He had a history of coronary disease including prior CABG.  His history was most worrisome for escalating angina, and he was referred to the emergency department.  Dexilant  and Carafate  were prescribed at that time. Patient had elevated BNP as well as mild elevation of troponin.  Cardiac catheterization done on 10/26/2018 showed severe multivessel disease and bypass grafts, upon which no intervention could be taken.  Medical management was recommended with lipid-lowering agent, antiplatelet agents, beta-blocker, nitroglycerin  and diuretics.  He was also switched from lisinopril  to Entresto . Cardiology office follow-up on 11/05/2018 with Russell Trevino noted ischemic cardiomyopathy with EF 40 to 45%, recent NSTEMI referring to the admission noted above. Russell Trevino has continued to have frequent heartburn, sometimes after meals, but often with light exertion.  He has not been taking the lingual nitroglycerin , because he believed it was for emergencies.  He has episodes of regurgitation during the day and sometimes overnight.  He denies dysphagia, nausea or vomiting or weight loss.  Subjective  HPI:  Russell Trevino was here today requesting help for some upper digestive issues. It was somewhat difficult to get a clear and consistent history, but he is primarily describing early satiety, bloating with nausea and intermittent regurgitation or vomiting.  He says this was an issue when I saw him about 5 years ago and has worsened since then.  He splits his time between here in [facility]  where he has a [place]  home, and he has been seeing a GI practice and they are over the last few years.  None of those records are available today, but Russell Trevino tells me he had a colonoscopy within the last 2 years and some further testing that sounds like a gastric emptying study.  He recalls being told that his stomach does not empty well and he was put on some medicine that he feels probably did not help much.  Sometime within about the last year it sounds like he went on Ozempic  that slowly increasing doses but had worsening upper GI symptoms.  He will return to that GI practice (at first told me it was 2 weeks ago, then perhaps as long as 3 or 4 months ago), and he was told to stop the Ozempic  because it was worsening his delayed gastric emptying, and he was put on a nasal spray treatment.  This sounded likely to be metoclopramide, and while at the visit he called his girlfriend at home so she could find the bottle and confirm that was the case.  He has been using that 4 times a day before meals and bedtime.  With the discontinuation of the Ozempic  his upper GI symptoms have improved somewhat but still persist.  He has nighttime regurgitation that makes him feel short of breath and might give him chest pain.  Sometimes will take nitroglycerin  for that, which seems like perhaps when he was reporting to his cardiologist this week.  He is requesting a better understanding of what is causing this and what he can do about it. In addition, Russell Trevino has not seen his primary care provider Dr. Geofm since May 30 if, and says  he does not have a primary care provider in [place] .  (Encounter views suggest that there may be a practice in [place]  refilling some medicines for him)    ROS:  Review of Systems  Constitutional:  Negative for appetite change and unexpected weight change.  HENT:  Negative for mouth sores and voice change.   Eyes:  Negative for pain and redness.  Respiratory:  Positive for shortness  of breath. Negative for cough.   Cardiovascular:  Positive for chest pain. Negative for palpitations.  Genitourinary:  Negative for dysuria and hematuria.  Musculoskeletal:  Negative for arthralgias and myalgias.  Skin:  Negative for pallor and rash.  Neurological:  Negative for weakness and headaches.       Painful neuropathy in his legs and feet  Hematological:  Negative for adenopathy.     Past Medical History: Past Medical History:  Diagnosis Date   BPH (benign prostatic hypertrophy)    Cancer (HCC)    prostate   Cardiomyopathy, ischemic    CHF (congestive heart failure) (HCC)    Coronary artery disease    at [facility] [place] 2009 (GRAFTS PATENT), cath 05/2018 w/ SVG-PDA 100%>>med rx   Diabetes mellitus, type 2 (HCC)    Frequency of urination    GERD (gastroesophageal reflux disease)    Gross hematuria    History of CHF (congestive heart failure) 2007   History of kidney stones    History of myocardial infarction 2006, 2019   2006>>CABG, 2019 cath w/ med rx   History of urinary retention    Hyperlipidemia    Hypertension    Myocardial infarction [facility])    2006   Nocturia    S/P CABG x 4 2006   From cardiology office visit 06/10/2024:  Russell Trevino is a 73 y.o. male with a hx of CAD s/p CABG 04/2005, ischemic cardiomyopathy, HFrEF, hypertension, hyperlipidemia, DM 2, GERD, CKD 3B.   Admitted 12/16/2021 after presenting with chest pain.  It was unclear which of his medications he was or was not taking.  Echo 12/11/2021 LVEF 25% with septal and inferior wall motion abnormalities worse than previous.  He underwent LHC showing native left main occlusion with patent arterial graft. Medical management recommended. Plan was to potentially add ARNI in outpatient setting.  SGLT2 was discontinued to allow for starting of Ozempic .  Has since moved to [place], [place] but maintained healthcare in [place].   Presents today for preoperative clearance for EGD and colonoscopy with request to  hold Plavix  5 days prior.   He notes at night when trying to sleep he has some hard breathing. This is relieved by taking a PRN nitroglycerin . This is occurring a couple nights per week and started happening a couple months ago. Notes this sensation wakes him up right before he is going to fall asleep. No exertional chest pain nor dyspnea, no palpitations. He has not been checking blood pressure at home. Reports blood sugar at home has been pretty high. He is not taking Ozempic . Reports outside provider told him the Ozempic  was making it harder for him to digest food which he already has difficulties with. He is seeing GI, Dr. Legrand, 06/13/24. Plans to inquire with GI whether safe to resume. He also notes he often takes nitroglycerin  for his stomach issues with improvement in symptoms. Requests refill for 100 tablets, discussed office standard for 25 tablets with refills. He reports he is not taking Metoprolol . He is uncertain if he is taking Ranexa .  Reports more difficulty with neuropathy and balance x 1 month. Also notes some bilateral lower extremity which is worse by end of the day. Does improve with elevation.  Preop clearance not given because patient's symptoms were concerning for unstable angina and he is scheduled for nuclear stress test  Past Surgical History: Past Surgical History:  Procedure Laterality Date   CARDIAC CATHETERIZATION  01/17/2008   GRAFTS PATENT / EF 40%,  DR SANTOS ([facility])   CORONARY ARTERY BYPASS GRAFT  04/2005   LIMA-LAD, L radial-OM-D1 and SVG-PDA   CYSTO / RIGHT URETERAL STENT PLACEMENT  10-08-1999   CYSTO/  REMOVAL BLADDER STONES  01-14-2010   CYSTOSCOPY  10/16/2012   Procedure: CYSTOSCOPY;  Surgeon: Norleen JINNY Seltzer, MD;  Location: [facility];  Service: Urology;  Laterality: N/A;  Cystoscopy with Clot Evacuation and fulguration bladder neck.   EXTRACORPOREAL SHOCK WAVE LITHOTRIPSY  10-15-2009   RIGHT   GOLD SEED IMPLANT N/A 10/06/2022   Procedure:  GOLD SEED IMPLANT;  Surgeon: Renda Glance, MD;  Location: [facility];  Service: Urology;  Laterality: N/A;   KNEE ARTHROSCOPY  1978   RIGHT   LEFT HEART CATH AND CORS/GRAFTS ANGIOGRAPHY N/A 12/17/2021   Procedure: LEFT HEART CATH AND CORS/GRAFTS ANGIOGRAPHY;  Surgeon: Wendel Lurena POUR, MD;  Location: [facility];  Service: Cardiovascular;  Laterality: N/A;   LEFT SHOULDER SURGERY   2002   LUMBAR FUSION  09-10-2008   L4 - L5   PERCUTANEOUS NEPHROSTOLITHOTOMY  1979   PROSTATE BIOPSY  10/16/2012   Procedure: PROSTATE BIOPSY;  Surgeon: Norleen JINNY Seltzer, MD;  Location: [facility];  Service: Urology;  Laterality: N/A;  Through ultrasound.   RIGHT/LEFT HEART CATH AND CORONARY/GRAFT ANGIOGRAPHY N/A 10/26/2018   Procedure: RIGHT/LEFT HEART CATH AND CORONARY/GRAFT ANGIOGRAPHY;  Surgeon: Swaziland, Peter M, MD;  Location: [facility]AS[facility];  Service: Cardiovascular;  Laterality: N/A;   SHOULDER ARTHROSCOPY W/ SUBACROMIAL DECOMPRESSION AND DISTAL CLAVICLE EXCISION  10-11-2004   AND PARTIAL ROTATOR CUFF REPAIR   SPACE OAR INSTILLATION N/A 10/06/2022   Procedure: SPACE OAR INSTILLATION;  Surgeon: Renda Glance, MD;  Location: [facility];  Service: Urology;  Laterality: N/A;  30 MINS FOR CASE   TRANSTHORACIC ECHOCARDIOGRAM  01/09/2012   LV SYSTOLIC FUNCTION MILDLY REDUCED/ EF 48%/ LV FILLING PATTERN  IS IMPAIRED/ HYPOKINESIS IN THE MID AND BASALAR SEPTUM & ANTEROSEPTUM   TURP VAPORIZATION  01-19-2010   BPH W/ BOO   URETEROLITHOTOMY  1976     Family History: Family History  Problem Relation Age of Onset   Lung cancer Mother 35   Diabetes Father    Stomach cancer Neg Hx    Colon cancer Neg Hx    Rectal cancer Neg Hx    Esophageal cancer Neg Hx    Pancreatic cancer Neg Hx     Social History: Social History   Socioeconomic History   Marital status: Legally Separated    Spouse name: Not on file   Number of children: 4   Years of education: Not on file   Highest education level: Not on  file  Occupational History   Occupation: RETIRED  Tobacco Use   Smoking status: Never   Smokeless tobacco: Never  Vaping Use   Vaping status: Never Used  Substance and Sexual Activity   Alcohol use: Not Currently    Alcohol/week: 2.0 standard drinks of alcohol    Types: 2 Cans of beer per week   Drug use: No  Sexual activity: Not Currently  Other Topics Concern   Not on file  Social History Narrative   Lives in [place], [place], has family and friends in [place].   Drinking caffeine sodas 3-4 bottles a day   Right handed      Lives with a his girlfriend here in [place]   Social Drivers of Health   Financial Resource Strain: Low Risk  (01/19/2024)   Overall Financial Resource Strain (CARDIA)    Difficulty of Paying Living Expenses: Not hard at all  Food Insecurity: No Food Insecurity (01/19/2024)   Hunger Vital Sign    Worried About Running Out of Food in the Last Year: Never true    Ran Out of Food in the Last Year: Never true  Transportation Needs: No Transportation Needs (01/19/2024)   PRAPARE - Administrator, Civil Service (Medical): No    Lack of Transportation (Non-Medical): No  Physical Activity: Sufficiently Active (01/19/2024)   Exercise Vital Sign    Days of Exercise per Week: 6 days    Minutes of Exercise per Session: 30 min  Stress: Stress Concern Present (01/19/2024)   [employer] of Occupational Health - Occupational Stress Questionnaire    Feeling of Stress : To some extent  Social Connections: Socially Isolated (01/19/2024)   Social Connection and Isolation Panel    Frequency of Communication with Friends and Family: Never    Frequency of Social Gatherings with Friends and Family: Never    Attends Religious Services: Never    Database administrator or Organizations: No    Attends Engineer, structural: Never    Marital Status: Separated    Allergies: No Known Allergies  Outpatient Meds: Current Outpatient Medications   Medication Sig Dispense Refill   clopidogrel  (PLAVIX ) 75 MG tablet Take 1 tablet (75 mg total) by mouth daily. 30 tablet 2   ezetimibe  (ZETIA ) 10 MG tablet Take 1 tablet (10 mg total) by mouth daily. 90 tablet 3   famotidine  (PEPCID ) 40 MG tablet Take 40 mg by mouth at bedtime.     finasteride  (PROSCAR ) 5 MG tablet TAKE 1 TABLET(5 MG) BY MOUTH DAILY 90 tablet 1   gabapentin  (NEURONTIN ) 100 MG capsule Take 4-5 capsules (400-500 mg total) by mouth at bedtime. 450 capsule 1   GIMOTI 15 MG/ACT SOLN Place 1 spray into right nostril in the morning and at bedtime.     Insulin  Pen Needle 31G X 6 MM MISC UAD for insulin  pen 60 each 5   Insulin  Syringe-Needle U-100 (BD VEO INSULIN  SYRINGE U/F) 31G X 15/64 1 ML MISC use as directed TO INJECT INSULIN   twice a day 10 each 2   isosorbide  mononitrate (IMDUR ) 120 MG 24 hr tablet Take 1 tablet (120 mg total) by mouth daily. 90 tablet 1   LANTUS  SOLOSTAR 100 UNIT/ML Solostar Pen ADMINISTER 30 UNITS UNDER THE SKIN TWICE DAILY 15 mL 1   metoprolol  succinate (TOPROL -XL) 25 MG 24 hr tablet Take 1 tablet (25 mg total) by mouth daily at 12 noon. Take with or immediately following a meal. 90 tablet 1   nitroGLYCERIN  (NITROSTAT ) 0.4 MG SL tablet Place 1 tablet under the tongue every 5 minutes for chest pain as needed. 25 tablet 0   ondansetron  (ZOFRAN -ODT) 4 MG disintegrating tablet DISSOLVE 1 TABLET(4 MG) ON THE TONGUE EVERY 8 HOURS AS NEEDED FOR NAUSEA OR VOMITING 20 tablet 0   pantoprazole  (PROTONIX ) 40 MG tablet Take 40 mg by mouth daily.  ranolazine  (RANEXA ) 500 MG 12 hr tablet Take 1 tablet (500 mg total) by mouth 2 (two) times daily. 180 tablet 1   rosuvastatin  (CRESTOR ) 40 MG tablet Take 1 tablet (40 mg total) by mouth daily. 90 tablet 3   sacubitril -valsartan  (ENTRESTO ) 24-26 MG Take 1 tablet by mouth 2 (two) times daily. 60 tablet 6   No current facility-administered medications for this visit.       ___________________________________________________________________ Objective   Exam:  BP (!) 116/50   Pulse 82   Ht 6' (1.829 m)   Wt 221 lb 4 oz (100.4 kg)   BMI 30.01 kg/m  Wt Readings from Last 3 Encounters:  06/13/24 221 lb 4 oz (100.4 kg)  06/10/24 219 lb 8 oz (99.6 kg)  02/09/24 207 lb (93.9 kg)    General: He is ambulatory, well-appearing, pleasant and conversational Eyes: sclera anicteric, no redness ENT: oral mucosa moist without lesions, no cervical or supraclavicular lymphadenopathy CV: Regular without appreciable murmur, no JVD, no peripheral edema Resp: clear to auscultation bilaterally, normal RR and effort noted GI: soft, no tenderness, with active bowel sounds. No guarding or palpable organomegaly noted.   Labs:     Latest Ref Rng & Units 02/09/2024   11:34 AM 11/10/2023    2:51 PM 06/28/2023    2:40 PM  CBC  WBC 4.0 - 10.5 K/uL 5.0  4.4  5.7   Hemoglobin 13.0 - 17.0 g/dL 89.4  87.1  88.5   Hematocrit 39.0 - 52.0 % 30.6  37.4  33.8   Platelets 150.0 - 400.0 K/uL 140.0  120.0  130.0       Latest Ref Rng & Units 02/09/2024   11:34 AM 11/10/2023    2:51 PM 06/28/2023    2:40 PM  CMP  Glucose 70 - 99 mg/dL 573  815  543   BUN 6 - 23 mg/dL 40  32  29   Creatinine 0.40 - 1.50 mg/dL 7.95  7.89  8.17   Sodium 135 - 145 mEq/L 128  136  133   Potassium 3.5 - 5.1 mEq/L 4.8  4.0  4.8   Chloride 96 - 112 mEq/L 95  102  101   CO2 19 - 32 mEq/L 24  23  24    Calcium  8.4 - 10.5 mg/dL 9.6  9.2  9.5   Total Protein 6.0 - 8.3 g/dL 6.6  7.3  7.0   Total Bilirubin 0.2 - 1.2 mg/dL 1.0  1.4  1.5   Alkaline Phos 39 - 117 U/L 63  59  68   AST 0 - 37 U/L 13  18  14    ALT 0 - 53 U/L 14  13  18     Hemoglobin A1c 11.5 in May of this year, 9.1 in October 2024   Positive cologuard in 2022   Assessment: Encounter Diagnoses  Name Primary?   Nausea and vomiting in adult Yes   Early satiety    Diabetic gastroparesis (HCC)     No outside GI records for  review, but I believe we have put together the story after long conversation today that suggests likely has diabetic related gastroparesis causing all of these symptoms.  Not yet clear if the chest pain and shortness of breath he has been describing are ancillary to that or cardiac in nature, though he is known to have severe multivessel post CABG coronary disease.  Cardiology is planning a stress test shortly.  It is also not clear to me if Russell Trevino  has some memory challenges or some degree of limited health literacy to remember all the specifics of his care, but he did not seem to have a good understanding of the nature of this condition.  I talked about why it occurs and the need for optimal glucose management, special dietary considerations and use of metoclopramide.  It is not clear if anybody had spoken with him about the potential neurologic side effects of metoclopramide or the potential for QT prolongation and cardiac arrhythmia, so I reviewed all that with him.  He is splitting his time between here in [place]  and still plans to see the GI practice there who has been prescribing the metoclopramide for him.  He does feel like it has been helping somewhat but he has persistent symptoms.  There was probably only so much medication can improve these since he needs better control of his glucose that can be achieved.  Symptoms all worsened on GLP-1 agonist, though consideration could be given to resuming that at a lower dose if that would help control his blood sugar better.  It sounds like when he got at or near the maximum dose of that that his gastroparesis understandably worsened. He does not need an upper endoscopy at this point.   Plan:  I have strongly encouraged him to get back in touch with Dr. Geofm because he needs management of his diabetes with further discussions about his treatment options.  He is also soon to have a cardiac stress test as noted above.  We gave him some reading  material about gastroparesis related dietary requirements and I directed him to [facility] clinic and [facility] websites for further information about this condition.  No specific follow-up arranged for him at this time, but I can answer questions as they arise.  And again, he also plans to follow-up with his GI practice in [place]  when he is in that area.   Thank you for the courtesy of this consult.  Please call me with any questions or concerns.  Victory LITTIE Brand III  CC: Referring provider noted above

## 2024-07-04 ENCOUNTER — Encounter: Payer: Self-pay | Admitting: *Deleted

## 2024-07-04 NOTE — Progress Notes (Signed)
 SAVEON PLANT                                          MRN: [id number]   07/04/2024   The VBCI Quality Team Specialist reviewed this patient medical record for the purposes of chart review for care gap closure. The following were reviewed: chart review for care gap closure-glycemic status assessment.    VBCI Quality Team

## 2024-07-10 ENCOUNTER — Other Ambulatory Visit: Payer: Self-pay | Admitting: Nurse Practitioner

## 2024-07-11 ENCOUNTER — Other Ambulatory Visit (HOSPITAL_BASED_OUTPATIENT_CLINIC_OR_DEPARTMENT_OTHER): Payer: Self-pay

## 2024-07-12 ENCOUNTER — Ambulatory Visit (HOSPITAL_BASED_OUTPATIENT_CLINIC_OR_DEPARTMENT_OTHER): Admitting: Family

## 2024-07-15 ENCOUNTER — Telehealth (HOSPITAL_COMMUNITY): Payer: Self-pay | Admitting: *Deleted

## 2024-07-15 NOTE — Telephone Encounter (Signed)

## 2024-07-19 ENCOUNTER — Other Ambulatory Visit (HOSPITAL_BASED_OUTPATIENT_CLINIC_OR_DEPARTMENT_OTHER): Payer: Self-pay | Admitting: Family

## 2024-07-19 DIAGNOSIS — Z01818 Encounter for other preprocedural examination: Secondary | ICD-10-CM

## 2024-07-19 DIAGNOSIS — I5042 Chronic combined systolic (congestive) and diastolic (congestive) heart failure: Secondary | ICD-10-CM

## 2024-07-19 DIAGNOSIS — E1165 Type 2 diabetes mellitus with hyperglycemia: Secondary | ICD-10-CM

## 2024-07-19 DIAGNOSIS — E785 Hyperlipidemia, unspecified: Secondary | ICD-10-CM

## 2024-07-19 DIAGNOSIS — I25119 Atherosclerotic heart disease of native coronary artery with unspecified angina pectoris: Secondary | ICD-10-CM

## 2024-07-19 DIAGNOSIS — I5022 Chronic systolic (congestive) heart failure: Secondary | ICD-10-CM

## 2024-07-22 ENCOUNTER — Encounter (HOSPITAL_COMMUNITY): Payer: Self-pay | Admitting: Family

## 2024-07-22 ENCOUNTER — Ambulatory Visit (HOSPITAL_COMMUNITY): Admission: RE | Admit: 2024-07-22 | Source: Ambulatory Visit | Attending: Family | Admitting: Family

## 2024-07-25 ENCOUNTER — Encounter: Payer: Self-pay | Admitting: Internal Medicine

## 2024-07-25 ENCOUNTER — Ambulatory Visit: Payer: Self-pay

## 2024-07-25 NOTE — Telephone Encounter (Signed)
 FYI Only or Action Required?: FYI only for provider: appointment scheduled on 07/26/24.  Patient was last seen in primary care on 02/09/2024 by Geofm Glade PARAS, MD.  Called Nurse Triage reporting Shortness of Breath.  Symptoms began a week ago.  Interventions attempted: Rest, hydration, or home remedies.  Symptoms are: gradually worsening.  Triage Disposition: See HCP Within 4 Hours (Or PCP Triage)  Patient/caregiver understands and will follow disposition?: Yes, pt declines alt clinic, scheduled with PCP clinic 07/26/24.    Copied from CRM [id number]. Topic: Clinical - Red Word Triage >> Jul 25, 2024 10:18 AM Tinnie BROCKS wrote: Red Word that prompted transfer to Nurse Triage: Coughing badly, very short of breathe. Requesting appointment with Glade Geofm @ [facility]. Reason for Disposition  [1] MILD difficulty breathing (e.g., minimal/no SOB at rest, SOB with walking, pulse < 100) AND [2] NEW-onset or WORSE than normal  Answer Assessment - Initial Assessment Questions 1. RESPIRATORY STATUS: Describe your breathing? (e.g., wheezing, shortness of breath, unable to speak, severe coughing)      SOB, cough 2. ONSET: When did this breathing problem begin?      X 1 week 3. PATTERN Does the difficult breathing come and go, or has it been constant since it started?      Intermittent, with exertion and when pt lies flat 4. SEVERITY: How bad is your breathing? (e.g., mild, moderate, severe)     Pt notes SOB with exertion and when pt lies flat 5. RECURRENT SYMPTOM: Have you had difficulty breathing before? If Yes, ask: When was the last time? and What happened that time?      None 6. CARDIAC HISTORY: Do you have any history of heart disease? (e.g., heart attack, angina, bypass surgery, angioplasty)      Heart attack 2006 7. LUNG HISTORY: Do you have any history of lung disease?  (e.g., pulmonary embolus, asthma, emphysema)     None 8. CAUSE: What do you think is causing  the breathing problem?      Unknown 9. OTHER SYMPTOMS: Do you have any other symptoms? (e.g., chest pain, cough, dizziness, fever, runny nose)     Cough, non-productive, wheezing 10. O2 SATURATION MONITOR:  Do you use an oxygen saturation monitor (pulse oximeter) at home? If Yes, ask: What is your reading (oxygen level) today? What is your usual oxygen saturation reading? (e.g., 95%)       NA, pt does not have a pulse oximeter 12. TRAVEL: Have you traveled out of the country in the last month? (e.g., travel history, exposures)       None  Protocols used: Breathing Difficulty-A-AH

## 2024-07-25 NOTE — Progress Notes (Unsigned)
    Subjective:    Patient ID: Russell Trevino, male    DOB: 18-Apr-1951, 73 y.o.   MRN: [id number]      HPI Arlo is here for No chief complaint on file.        Medications and allergies reviewed with patient and updated if appropriate.  Current Outpatient Medications on File Prior to Visit  Medication Sig Dispense Refill   clopidogrel  (PLAVIX ) 75 MG tablet Take 1 tablet (75 mg total) by mouth daily. 30 tablet 2   ezetimibe  (ZETIA ) 10 MG tablet Take 1 tablet (10 mg total) by mouth daily. 90 tablet 3   famotidine  (PEPCID ) 40 MG tablet Take 40 mg by mouth at bedtime.     finasteride  (PROSCAR ) 5 MG tablet TAKE 1 TABLET(5 MG) BY MOUTH DAILY 90 tablet 1   gabapentin  (NEURONTIN ) 100 MG capsule Take 4-5 capsules (400-500 mg total) by mouth at bedtime. 450 capsule 1   GIMOTI 15 MG/ACT SOLN Place 1 spray into right nostril in the morning and at bedtime.     Insulin  Pen Needle 31G X 6 MM MISC UAD for insulin  pen 60 each 5   Insulin  Syringe-Needle U-100 (BD VEO INSULIN  SYRINGE U/F) 31G X 15/64 1 ML MISC use as directed TO INJECT INSULIN   twice a day 10 each 2   isosorbide  mononitrate (IMDUR ) 120 MG 24 hr tablet Take 1 tablet (120 mg total) by mouth daily. 90 tablet 1   LANTUS  SOLOSTAR 100 UNIT/ML Solostar Pen ADMINISTER 30 UNITS UNDER THE SKIN TWICE DAILY 15 mL 1   metoprolol  succinate (TOPROL -XL) 25 MG 24 hr tablet Take 1 tablet (25 mg total) by mouth daily at 12 noon. Take with or immediately following a meal. 90 tablet 1   nitroGLYCERIN  (NITROSTAT ) 0.4 MG SL tablet Place 1 tablet under the tongue every 5 minutes for chest pain as needed. 25 tablet 0   ondansetron  (ZOFRAN -ODT) 4 MG disintegrating tablet DISSOLVE 1 TABLET(4 MG) ON THE TONGUE EVERY 8 HOURS AS NEEDED FOR NAUSEA OR VOMITING 20 tablet 0   pantoprazole  (PROTONIX ) 40 MG tablet Take 40 mg by mouth daily.     ranolazine  (RANEXA ) 500 MG 12 hr tablet Take 1 tablet (500 mg total) by mouth 2 (two) times daily. 180 tablet 1    rosuvastatin  (CRESTOR ) 40 MG tablet Take 1 tablet (40 mg total) by mouth daily. 90 tablet 3   sacubitril -valsartan  (ENTRESTO ) 24-26 MG TAKE 1 TABLET BY MOUTH TWICE DAILY 180 tablet 3   No current facility-administered medications on file prior to visit.    Review of Systems     Objective:  There were no vitals filed for this visit. BP Readings from Last 3 Encounters:  06/13/24 (!) 116/50  06/10/24 (!) 160/76  02/09/24 (!) 140/68   Wt Readings from Last 3 Encounters:  06/13/24 221 lb 4 oz (100.4 kg)  06/10/24 219 lb 8 oz (99.6 kg)  02/09/24 207 lb (93.9 kg)   There is no height or weight on file to calculate BMI.    Physical Exam         Assessment & Plan:    See Problem List for Assessment and Plan of chronic medical problems.

## 2024-07-26 ENCOUNTER — Ambulatory Visit (INDEPENDENT_AMBULATORY_CARE_PROVIDER_SITE_OTHER)

## 2024-07-26 ENCOUNTER — Ambulatory Visit: Payer: Self-pay | Admitting: Internal Medicine

## 2024-07-26 ENCOUNTER — Ambulatory Visit: Admitting: Internal Medicine

## 2024-07-26 VITALS — BP 144/70 | HR 68 | Temp 98.6°F | Ht 72.0 in | Wt 224.0 lb

## 2024-07-26 DIAGNOSIS — J069 Acute upper respiratory infection, unspecified: Secondary | ICD-10-CM | POA: Diagnosis not present

## 2024-07-26 DIAGNOSIS — R0602 Shortness of breath: Secondary | ICD-10-CM | POA: Diagnosis not present

## 2024-07-26 DIAGNOSIS — Z794 Long term (current) use of insulin: Secondary | ICD-10-CM | POA: Diagnosis not present

## 2024-07-26 DIAGNOSIS — J189 Pneumonia, unspecified organism: Secondary | ICD-10-CM

## 2024-07-26 DIAGNOSIS — N1832 Chronic kidney disease, stage 3b: Secondary | ICD-10-CM

## 2024-07-26 DIAGNOSIS — E1122 Type 2 diabetes mellitus with diabetic chronic kidney disease: Secondary | ICD-10-CM | POA: Diagnosis not present

## 2024-07-26 DIAGNOSIS — I5023 Acute on chronic systolic (congestive) heart failure: Secondary | ICD-10-CM

## 2024-07-26 LAB — CBC WITH DIFFERENTIAL/PLATELET
Basophils Absolute: 0 K/uL (ref 0.0–0.1)
Basophils Relative: 0.8 % (ref 0.0–3.0)
Eosinophils Absolute: 0.1 K/uL (ref 0.0–0.7)
Eosinophils Relative: 2.9 % (ref 0.0–5.0)
HCT: 33.5 % — ABNORMAL LOW (ref 39.0–52.0)
Hemoglobin: 11.3 g/dL — ABNORMAL LOW (ref 13.0–17.0)
Lymphocytes Relative: 17.8 % (ref 12.0–46.0)
Lymphs Abs: 0.8 K/uL (ref 0.7–4.0)
MCHC: 33.8 g/dL (ref 30.0–36.0)
MCV: 87.3 fl (ref 78.0–100.0)
Monocytes Absolute: 0.4 K/uL (ref 0.1–1.0)
Monocytes Relative: 8.4 % (ref 3.0–12.0)
Neutro Abs: 3.3 K/uL (ref 1.4–7.7)
Neutrophils Relative %: 70.1 % (ref 43.0–77.0)
Platelets: 162 K/uL (ref 150.0–400.0)
RBC: 3.83 Mil/uL — ABNORMAL LOW (ref 4.22–5.81)
RDW: 15.6 % — ABNORMAL HIGH (ref 11.5–15.5)
WBC: 4.7 K/uL (ref 4.0–10.5)

## 2024-07-26 LAB — LIPID PANEL
Cholesterol: 153 mg/dL (ref 0–200)
HDL: 45.8 mg/dL (ref >39.00)
LDL Cholesterol: 72 mg/dL (ref 0–99)
NonHDL: 107.48
Total CHOL/HDL Ratio: 3
Triglycerides: 177 mg/dL — ABNORMAL HIGH (ref 0.0–149.0)
VLDL: 35.4 mg/dL (ref 0.0–40.0)

## 2024-07-26 LAB — COMPREHENSIVE METABOLIC PANEL WITH GFR
ALT: 15 U/L (ref 0–53)
AST: 15 U/L (ref 0–37)
Albumin: 3.8 g/dL (ref 3.5–5.2)
Alkaline Phosphatase: 67 U/L (ref 39–117)
BUN: 20 mg/dL (ref 6–23)
CO2: 27 meq/L (ref 19–32)
Calcium: 9.1 mg/dL (ref 8.4–10.5)
Chloride: 106 meq/L (ref 96–112)
Creatinine, Ser: 1.42 mg/dL (ref 0.40–1.50)
GFR: 48.82 mL/min — ABNORMAL LOW (ref >60.00)
Glucose, Bld: 284 mg/dL — ABNORMAL HIGH (ref 70–99)
Potassium: 4.2 meq/L (ref 3.5–5.1)
Sodium: 141 meq/L (ref 135–145)
Total Bilirubin: 1.6 mg/dL — ABNORMAL HIGH (ref 0.2–1.2)
Total Protein: 6.7 g/dL (ref 6.0–8.3)

## 2024-07-26 LAB — HEMOGLOBIN A1C: Hgb A1c MFr Bld: 10.1 % — ABNORMAL HIGH (ref 4.6–6.5)

## 2024-07-26 LAB — BRAIN NATRIURETIC PEPTIDE: Pro B Natriuretic peptide (BNP): 1047 pg/mL — ABNORMAL HIGH (ref 0.0–100.0)

## 2024-07-26 MED ORDER — TORSEMIDE 20 MG PO TABS: 20.0000 mg | ORAL_TABLET | Freq: Every day | ORAL | 0 refills | Status: DC

## 2024-07-26 MED ORDER — TIRZEPATIDE 2.5 MG/0.5ML ~~LOC~~ SOAJ: 2.5000 mg | SUBCUTANEOUS | 0 refills | Status: DC

## 2024-07-26 MED ORDER — CEFPODOXIME PROXETIL 200 MG PO TABS: 200.0000 mg | ORAL_TABLET | Freq: Two times a day (BID) | ORAL | 0 refills | Status: AC

## 2024-07-26 MED ORDER — AZITHROMYCIN 250 MG PO TABS: ORAL_TABLET | ORAL | 0 refills | Status: DC

## 2024-07-26 NOTE — Patient Instructions (Addendum)
      Blood work was ordered.   A chest xray was ordered    Medications changes include :   2 antibiotics and mounjaro      Return in about 2 months (around 09/25/2024) for follow up.

## 2024-07-26 NOTE — Assessment & Plan Note (Signed)
 Insulin  65 u am, 35 u pm  Mounjaro 2.5

## 2024-08-05 ENCOUNTER — Ambulatory Visit: Payer: Self-pay

## 2024-08-05 NOTE — Telephone Encounter (Signed)
 FYI Only or Action Required?: FYI only for provider: appointment scheduled on 08/06/24.  Patient was last seen in primary care on 07/26/2024 by Geofm Glade PARAS, MD.  Called Nurse Triage reporting Cough.  Symptoms began several weeks ago.  Interventions attempted: Nothing.  Symptoms are: unchanged.  Triage Disposition: See PCP When Office is Open (Within 3 Days)  Patient/caregiver understands and will follow disposition?: Yes    Copied from CRM #[id number]. Topic: Clinical - Red Word Triage >> Aug 05, 2024  9:48 AM Amy B wrote: Red Word that prompted transfer to Nurse Triage: Shortness of breath, dry cough Reason for Disposition  Cough has been present for > 3 weeks  Answer Assessment - Initial Assessment Questions Cough no better after taking the antibiotics. Agreeable to OV.   1. ONSET: When did the cough begin?      2.5 weeks  2. SEVERITY: How bad is the cough today?      6-7/10  3. SPUTUM: Describe the color of your sputum (e.g., none, dry cough; clear, white, yellow, green)     None, dry cough    5. DIFFICULTY BREATHING: Are you having difficulty breathing? If Yes, ask: How bad is it? (e.g., mild, moderate, severe)      A little when walking around, sometimes laying flat have SOB, not sitting up  6. FEVER: Do you have a fever? If Yes, ask: What is your temperature, how was it measured, and when did it start?     No  7. CARDIAC HISTORY: Do you have any history of heart disease? (e.g., heart attack, congestive heart failure)      Heart disease  8. LUNG HISTORY: Do you have any history of lung disease?  (e.g., pulmonary embolus, asthma, emphysema)     No  9. PE RISK FACTORS: Do you have a history of blood clots? (or: recent major surgery, recent prolonged travel, bedridden)       10. OTHER SYMPTOMS: Do you have any other symptoms? (e.g., runny nose, wheezing, chest pain)       Vomiting w/cough a few times,  Protocols used: Cough - Acute  Non-Productive-A-AH

## 2024-08-06 ENCOUNTER — Ambulatory Visit: Admitting: Internal Medicine

## 2024-08-06 ENCOUNTER — Encounter: Payer: Self-pay | Admitting: Internal Medicine

## 2024-08-06 VITALS — BP 150/80 | HR 63 | Temp 98.5°F | Ht 73.0 in

## 2024-08-06 DIAGNOSIS — E1140 Type 2 diabetes mellitus with diabetic neuropathy, unspecified: Secondary | ICD-10-CM

## 2024-08-06 DIAGNOSIS — I152 Hypertension secondary to endocrine disorders: Secondary | ICD-10-CM

## 2024-08-06 DIAGNOSIS — E1142 Type 2 diabetes mellitus with diabetic polyneuropathy: Secondary | ICD-10-CM

## 2024-08-06 DIAGNOSIS — I5023 Acute on chronic systolic (congestive) heart failure: Secondary | ICD-10-CM

## 2024-08-06 DIAGNOSIS — S0101XA Laceration without foreign body of scalp, initial encounter: Secondary | ICD-10-CM | POA: Diagnosis not present

## 2024-08-06 DIAGNOSIS — C61 Malignant neoplasm of prostate: Secondary | ICD-10-CM

## 2024-08-06 DIAGNOSIS — E1165 Type 2 diabetes mellitus with hyperglycemia: Secondary | ICD-10-CM

## 2024-08-06 DIAGNOSIS — Z794 Long term (current) use of insulin: Secondary | ICD-10-CM

## 2024-08-06 DIAGNOSIS — E1169 Type 2 diabetes mellitus with other specified complication: Secondary | ICD-10-CM

## 2024-08-06 DIAGNOSIS — J189 Pneumonia, unspecified organism: Secondary | ICD-10-CM | POA: Diagnosis not present

## 2024-08-06 DIAGNOSIS — E1159 Type 2 diabetes mellitus with other circulatory complications: Secondary | ICD-10-CM

## 2024-08-06 DIAGNOSIS — E785 Hyperlipidemia, unspecified: Secondary | ICD-10-CM

## 2024-08-06 MED ORDER — FUROSEMIDE 20 MG PO TABS: 20.0000 mg | ORAL_TABLET | Freq: Every day | ORAL | 1 refills | Status: AC

## 2024-08-06 MED ORDER — LANTUS SOLOSTAR 100 UNIT/ML ~~LOC~~ SOPN: 35.0000 [IU] | PEN_INJECTOR | Freq: Two times a day (BID) | SUBCUTANEOUS | Status: AC

## 2024-08-06 NOTE — Assessment & Plan Note (Signed)
 Urine symptoms consistent with acute on chronic heart failure Symptoms that he is experiencing are consistent with acute on chronic systolic heart failure He has shortness of breath when laying flat and bending over He also has coughing and wheezing when laying flat or bending over Does not necessarily feel short of breath when walking Has furosemide  at home but has not been taking it Start furosemide  40 mg daily x 2 days then 20 mg daily Advised him to let me know if this does not improve his shortness of breath over the next several days Has cardiology follow-up 12/16

## 2024-08-06 NOTE — Assessment & Plan Note (Signed)
 Recent episode of community-acquired pneumonia Completed antibiotics He does still have some residual cough, wheeze and shortness of breath which I think are more related and not related to his infection No need for additional antibiotics

## 2024-08-06 NOTE — Assessment & Plan Note (Signed)
 Chronic Lab Results  Component Value Date   LDLCALC 72 07/26/2024    Check lipid panel  Continue Zetia  10 mg daily, Crestor  40 mg daily

## 2024-08-06 NOTE — Patient Instructions (Addendum)
       Medications changes include :   start furosemide  20 mg daily - you can take 40 mg daily for 1-2 days       Return in about 3 months (around 11/06/2024) for follow up.

## 2024-08-06 NOTE — Assessment & Plan Note (Signed)
 Acute Fell yesterday, no loss of consciousness He did have some initial dizziness and confusion, but that resolved quickly after the fall Has pain where he hit his head, but denies any headaches, dizziness now, confusion now, no change in vision or nausea Stressed that if he does hit his head in the future while on Plavix  he needs to be evaluated Wound is no longer bleeding and should heal-advised not to rub the area-okay to let soap and water  rinse over the area

## 2024-08-06 NOTE — Assessment & Plan Note (Signed)
 Chronic Currently taking gabapentin  4-500 mg at night Discussed his poor balance is related to neuropathy Stressed better sugar control

## 2024-08-06 NOTE — Progress Notes (Signed)
 Subjective:    Patient ID: Russell Trevino, male    DOB: [birth year], 73 y.o.   MRN: [id number]      HPI Russell Trevino is here for  Chief Complaint  Patient presents with   Acute Visit    Shortness of breath and outshining 17 days symptoms never went away.       Completed both abx - still SOB and if lays flat or bends over cough.  Cough has improved.    Fell yesterday - hit head - posterior- litle confusion - LOC  Discussed the use of AI scribe software for clinical note transcription with the patient, who gave verbal consent to proceed.  History of Present Illness Russell Trevino is a 73 year old male with heart failure who presents with a recent fall and head injury.  He fell yesterday, hitting the back of his head, which resulted in significant bleeding. There was no loss of consciousness, but he experienced confusion and dizziness initially, which resolved after sitting for about 45 minutes. He applied ice to the wound to stop the bleeding, which was difficult due to his use of Plavix .    He experiences shortness of breath primarily when lying flat or bending over, accompanied by a dry cough. These symptoms have improved since his initial presentation, where he coughed continuously. No fever, nasal congestion, sore throat, or sinus pain. He also experiences wheezing when lying flat.  He is currently taking 35 units of Lantus  insulin  twice a day and has recently started Mounjaro .        Medications and allergies reviewed with patient and updated if appropriate.  Current Outpatient Medications on File Prior to Visit  Medication Sig Dispense Refill   azithromycin  (ZITHROMAX ) 250 MG tablet Take two tabs the first day and then one tab daily for four days 6 tablet 0   clopidogrel  (PLAVIX ) 75 MG tablet Take 1 tablet (75 mg total) by mouth daily. 30 tablet 2   ezetimibe  (ZETIA ) 10 MG tablet Take 1 tablet (10 mg total) by mouth daily. 90 tablet 3   famotidine  (PEPCID ) 40 MG tablet  Take 40 mg by mouth at bedtime.     finasteride  (PROSCAR ) 5 MG tablet TAKE 1 TABLET(5 MG) BY MOUTH DAILY 90 tablet 1   gabapentin  (NEURONTIN ) 100 MG capsule Take 4-5 capsules (400-500 mg total) by mouth at bedtime. 450 capsule 1   GIMOTI 15 MG/ACT SOLN Place 1 spray into right nostril in the morning and at bedtime.     Insulin  Pen Needle 31G X 6 MM MISC UAD for insulin  pen 60 each 5   Insulin  Syringe-Needle U-100 (BD VEO INSULIN  SYRINGE U/F) 31G X 15/64 1 ML MISC use as directed TO INJECT INSULIN   twice a day 10 each 2   isosorbide  mononitrate (IMDUR ) 120 MG 24 hr tablet Take 1 tablet (120 mg total) by mouth daily. 90 tablet 1   LANTUS  SOLOSTAR 100 UNIT/ML Solostar Pen ADMINISTER 30 UNITS UNDER THE SKIN TWICE DAILY 15 mL 1   metoprolol  succinate (TOPROL -XL) 25 MG 24 hr tablet Take 1 tablet (25 mg total) by mouth daily at 12 noon. Take with or immediately following a meal. 90 tablet 1   nitroGLYCERIN  (NITROSTAT ) 0.4 MG SL tablet Place 1 tablet under the tongue every 5 minutes for chest pain as needed. 25 tablet 0   ondansetron  (ZOFRAN -ODT) 4 MG disintegrating tablet DISSOLVE 1 TABLET(4 MG) ON THE TONGUE EVERY 8 HOURS AS NEEDED FOR NAUSEA OR VOMITING 20  tablet 0   pantoprazole  (PROTONIX ) 40 MG tablet Take 40 mg by mouth daily.     ranolazine  (RANEXA ) 500 MG 12 hr tablet Take 1 tablet (500 mg total) by mouth 2 (two) times daily. 180 tablet 1   rosuvastatin  (CRESTOR ) 40 MG tablet Take 1 tablet (40 mg total) by mouth daily. 90 tablet 3   sacubitril -valsartan  (ENTRESTO ) 24-26 MG TAKE 1 TABLET BY MOUTH TWICE DAILY 180 tablet 3   tirzepatide  (MOUNJARO ) 2.5 MG/0.5ML Pen Inject 2.5 mg into the skin once a week. 2 mL 0   torsemide  (DEMADEX ) 20 MG tablet Take 1 tablet (20 mg total) by mouth daily. 4 tablet 0   No current facility-administered medications on file prior to visit.    Review of Systems  Constitutional:  Negative for fever.  HENT:  Negative for congestion, sinus pain and sore throat.   Eyes:   Negative for visual disturbance.  Respiratory:  Positive for cough (dry - if he lays flat or bends over), shortness of breath (if lays flat - not when walking) and wheezing (if laying flat).   Cardiovascular:  Positive for leg swelling (Mild, at baseline). Negative for chest pain and palpitations.  Gastrointestinal:  Negative for nausea.  Neurological:  Positive for dizziness (a little just after fall, but resolved). Negative for headaches.       Pain where he hit his head  Psychiatric/Behavioral:  Positive for confusion (a little initially-resolved shortly after the fall).        Objective:   Vitals:   08/06/24 1517  BP: (!) 150/80  Pulse: 63  Temp: 98.5 F (36.9 C)  SpO2: 97%   BP Readings from Last 3 Encounters:  08/06/24 (!) 150/80  07/26/24 (!) 144/70  06/13/24 (!) 116/50   Wt Readings from Last 3 Encounters:  07/26/24 224 lb (101.6 kg)  06/13/24 221 lb 4 oz (100.4 kg)  06/10/24 219 lb 8 oz (99.6 kg)   Body mass index is 29.55 kg/m.    Physical Exam Constitutional:      General: He is not in acute distress.    Appearance: Normal appearance. He is not ill-appearing.  HENT:     Head: Normocephalic.     Comments: Approximately 1 inch laceration posterior head without active bleeding or discharge.  Slight swelling around the laceration it is tender to touch-no fluctuation.  Coagulated blood present in the laceration.  No foreign body Eyes:     Conjunctiva/sclera: Conjunctivae normal.  Cardiovascular:     Rate and Rhythm: Normal rate and regular rhythm.     Heart sounds: Normal heart sounds.  Pulmonary:     Effort: Pulmonary effort is normal. No respiratory distress.     Breath sounds: Normal breath sounds. No wheezing or rales.  Musculoskeletal:     Right lower leg: Edema (Mild, pitting) present.     Left lower leg: Edema (Mild, pitting) present.  Skin:    General: Skin is warm and dry.     Findings: No rash.  Neurological:     Mental Status: He is alert.  Mental status is at baseline.  Psychiatric:        Mood and Affect: Mood normal.            Assessment & Plan:    See Problem List for Assessment and Plan of chronic medical problems.

## 2024-08-06 NOTE — Assessment & Plan Note (Signed)
 Following with Dr Watt S/p radiation Currently on treatment

## 2024-08-06 NOTE — Assessment & Plan Note (Signed)
 Chronic Blood pressure not ideally controlled He has been sick for the past 2 visits he has been here Needs to monitor BP at home Has upcoming appointment with cardiology Restart Lasix  40 mg daily x 2 days then 20 mg daily Continue Imdur  120 mg daily, metoprolol  XL 25 mg daily, Entresto  24-26 mg twice daily

## 2024-08-06 NOTE — Assessment & Plan Note (Signed)
 Chronic Sugars are poorly controlled Stressed that he needs to be seen either me or his doctor in [place]  every 3 months for his diabetes Lab Results  Component Value Date   HGBA1C 10.1 (H) 07/26/2024   Continue Lantus  35 units twice daily Just started Mounjaro  2.5 mg weekly and will titrate monthly as tolerated

## 2024-08-15 ENCOUNTER — Other Ambulatory Visit: Payer: Self-pay | Admitting: Internal Medicine

## 2024-08-27 ENCOUNTER — Encounter (HOSPITAL_BASED_OUTPATIENT_CLINIC_OR_DEPARTMENT_OTHER): Payer: Self-pay | Admitting: Family

## 2024-08-27 ENCOUNTER — Ambulatory Visit (HOSPITAL_BASED_OUTPATIENT_CLINIC_OR_DEPARTMENT_OTHER): Admitting: Family

## 2024-08-27 VITALS — BP 104/48 | HR 50 | Ht 73.0 in | Wt 213.5 lb

## 2024-08-27 DIAGNOSIS — R0609 Other forms of dyspnea: Secondary | ICD-10-CM

## 2024-08-27 DIAGNOSIS — I10 Essential (primary) hypertension: Secondary | ICD-10-CM

## 2024-08-27 DIAGNOSIS — I5020 Unspecified systolic (congestive) heart failure: Secondary | ICD-10-CM

## 2024-08-27 DIAGNOSIS — E785 Hyperlipidemia, unspecified: Secondary | ICD-10-CM

## 2024-08-27 DIAGNOSIS — I25118 Atherosclerotic heart disease of native coronary artery with other forms of angina pectoris: Secondary | ICD-10-CM

## 2024-08-27 DIAGNOSIS — I255 Ischemic cardiomyopathy: Secondary | ICD-10-CM

## 2024-08-27 NOTE — Progress Notes (Signed)
 Cardiology Office Note   Date:  08/27/2024  ID:  Russell, Trevino March 11, 1951, MRN [id number] PCP: Russell Darryle Ned, MD  [facility] Providers Cardiologist:  Russell Bruckner, MD     History of Present Illness Russell Trevino is a 73 y.o. male with a hx of CAD s/p CABG 04/2005, ischemic cardiomyopathy, HFrEF, hypertension, hyperlipidemia, DM 2, GERD, CKD 3B.   Admitted 12/16/2021 after presenting with chest pain.  It was unclear which of his medications he was or was not taking.  Echo 12/11/2021 LVEF 25% with septal and inferior wall motion abnormalities worse than previous.  He underwent LHC showing native left main occlusion with patent arterial graft. Medical management recommended. Plan was to potentially add ARNI in outpatient setting.  SGLT2 was discontinued to allow for starting of Ozempic .  Has since moved to [place], [place] but maintained healthcare in [place].  Seen 06/10/24. Due to dyspnea lexiscan myoview ordered but not performed. He no showed his scheduled myoview. Clearance requested for EGD and colonoscopy which was unable to be provided given need for ischemic evaluation.   07/26/24 treated by PCP for community acquired PNA with abx. Labs that day BNP 1,047, trig 177, LDL 72, A1c 10.1.   At PCP visit 08/06/24 BP not at goal, he had LE edema. Lasix  resumed at 40mg  x 2 days then 20mg  daily. Imdur  120mg  daily, toprol  25mg  daily, entresto  24-26mg  BID continued.   Presents today for follow up independently. Reports he was having breathing difficulties, saw primary care. He has persistent dyspnea which is improved but not resolved. Dyspnea is at rest, with activity, or when trying to sleep. He self adjusted Lasix  back to PRN reports he takes when I feel fluid and when his legs are swollen. Reports a little chest pain  not very often. However, has taken nitroglycerin  recently reports taking often about 3 times per week. He takes nitroglycerin  when he has  heartburn. Reviewed prior recommendation for stress test, he said he did not have it done because he was sick with GI bug and never rescheduled. Notes his family has requested he stay in [place] through Christmas.    ROS: Please see the history of present illness.    All other systems reviewed and are negative.   Studies Reviewed      Cardiac Studies & Procedures   ______________________________________________________________________________________________ CARDIAC CATHETERIZATION  CARDIAC CATHETERIZATION 12/17/2021  Conclusion   Mid LM to Dist LM lesion is 100% stenosed.   Prox LAD lesion is 100% stenosed.   Prox Cx lesion is 100% stenosed.   Prox RCA to Dist RCA lesion is 100% stenosed.   Dist LAD lesion is 60% stenosed.   LV end diastolic pressure is normal.  1.  Patent LIMA to LAD with radial off the LIMA graft to diagonal and obtuse marginal branches. 2.  Vein graft right coronary artery known occluded and not imaged. 3.  Severe native vessel disease with known occlusion of right coronary artery which was not imaged on the study and newly occluded left main lesion. 4.  LVEDP of 16 mmHg.  The results were reviewed with Dr. Santo and optimal medical therapy will be pursued.  Findings Coronary Findings Diagnostic  Dominance: Right  Left Main Mid LM to Dist LM lesion is 100% stenosed.  Left Anterior Descending Prox LAD lesion is 100% stenosed. Dist LAD lesion is 60% stenosed.  Left Circumflex Prox Cx lesion is 100% stenosed.  Right Coronary Artery Prox RCA to Dist RCA lesion is  100% stenosed.  Acute Marginal Branch Collaterals Acute Mrg filled by collaterals from Prox RCA.  Right Posterior Descending Artery Collaterals RPDA filled by collaterals from 3rd Sept.  LIMA Graft To Mid LAD  Sequential Graft To 1st Diag, 3rd Mrg  Intervention  No interventions have been documented.   CARDIAC CATHETERIZATION  CARDIAC CATHETERIZATION  10/26/2018  Conclusion  Mid LM to Dist LM lesion is 95% stenosed.  Prox LAD lesion is 100% stenosed.  Prox Cx lesion is 100% stenosed.  Prox RCA to Dist RCA lesion is 100% stenosed.  LV end diastolic pressure is normal.  Hemodynamic findings consistent with mild pulmonary hypertension.  1. Occlusive native vessel CAD. -95% distal left main - 100% proximal LAD after the first septal perforator - 100% LCx after a very small OM1. - 100% proximal RCA 2. Patent LIMA to the LAD 3. Patent free radial graft arising from the mid LIMA graft and supplying the first diagonal and OM 4. SVG to RCA is known to be occluded. The distal RCA is supplied by collaterals. 5. Normal LV filling pressures 6. Mild pulmonary HTN. 7. Preserved cardiac output.  Plan: continue medical therapy for CHF. Will switch IV lasix  to po. Stop IV Ntg and heparin .  Findings Coronary Findings Diagnostic  Dominance: Right  Left Main Mid LM to Dist LM lesion is 95% stenosed.  Left Anterior Descending Prox LAD lesion is 100% stenosed.  Left Circumflex Prox Cx lesion is 100% stenosed.  Right Coronary Artery Prox RCA to Dist RCA lesion is 100% stenosed.  Acute Marginal Branch Collaterals Acute Mrg filled by collaterals from Prox RCA.  Right Posterior Descending Artery Collaterals RPDA filled by collaterals from 3rd Sept.  LIMA Graft To Mid LAD  Sequential Graft To 1st Diag, 3rd Mrg  Intervention  No interventions have been documented.     ECHOCARDIOGRAM  ECHOCARDIOGRAM COMPLETE 12/17/2021  Narrative ECHOCARDIOGRAM REPORT    Patient Name:   Russell Trevino Date of Exam: 12/17/2021 Medical Rec #:  [id number]       Height:       72.0 in Accession #:    [id number]      Weight:       235.0 lb Date of Birth:  Jul 27, 1951      BSA:          2.282 m Patient Age:    70 years        BP:           144/71 mmHg Patient Gender: M               HR:           73 bpm. Exam Location:  Inpatient  Procedure:  2D Echo and Intracardiac Opacification Agent  Indications:    Chest pain  History:        Patient has prior history of Echocardiogram examinations, most recent 10/26/2018. Cardiomyopathy and CHF, CAD and Previous Myocardial Infarction; Risk Factors:Diabetes.  Sonographer:    Harlene King Referring Phys: [phone] [facility]   Sonographer Comments: Image acquisition challenging due to patient body habitus. IMPRESSIONS   1. Left ventricular ejection fraction, by estimation, is 20 to 25%. The left ventricle has severely decreased function. The left ventricle demonstrates regional wall motion abnormalities (see scoring diagram/findings for description). The left ventricular internal cavity size was moderately dilated. Left ventricular diastolic parameters are indeterminate. 2. Right ventricular systolic function is moderately reduced. The right ventricular size is normal. 3. Left atrial size was  mildly dilated. 4. The mitral valve is grossly normal. Trivial mitral valve regurgitation. No evidence of mitral stenosis. 5. The aortic valve was not well visualized. Aortic valve regurgitation is not visualized. No aortic stenosis is present. 6. Aortic dilatation noted. There is mild dilatation of the ascending aorta, measuring 41 mm.  Comparison(s): Changes from prior study are noted.  Conclusion(s)/Recommendation(s): Wall motion better seen on current study. Diffuse hypokinesis with akinesis predominantly in the inferoseptal and inferior walls. No evidence of LV thrombus. Severely reduced LVEF, 20-25%.  FINDINGS Left Ventricle: Left ventricular ejection fraction, by estimation, is 20 to 25%. The left ventricle has severely decreased function. The left ventricle demonstrates regional wall motion abnormalities. Definity  contrast agent was given IV to delineate the left ventricular endocardial borders. The left ventricular internal cavity size was moderately dilated. There is no left ventricular  hypertrophy. Left ventricular diastolic parameters are indeterminate.   LV Wall Scoring: The mid and distal anterior septum, inferior septum, entire inferior wall, and apex are akinetic. The entire anterior wall, entire lateral wall, and basal anteroseptal segment are hypokinetic.  Right Ventricle: The right ventricular size is normal. Right vetricular wall thickness was not well visualized. Right ventricular systolic function is moderately reduced.  Left Atrium: Left atrial size was mildly dilated.  Right Atrium: Right atrial size was normal in size.  Pericardium: There is no evidence of pericardial effusion.  Mitral Valve: The mitral valve is grossly normal. Trivial mitral valve regurgitation. No evidence of mitral valve stenosis.  Tricuspid Valve: The tricuspid valve is grossly normal. Tricuspid valve regurgitation is trivial.  Aortic Valve: The aortic valve was not well visualized. Aortic valve regurgitation is not visualized. No aortic stenosis is present. Aortic valve mean gradient measures 2.0 mmHg. Aortic valve peak gradient measures 3.8 mmHg. Aortic valve area, by VTI measures 2.97 cm.  Pulmonic Valve: The pulmonic valve was not well visualized. Pulmonic valve regurgitation is trivial. No evidence of pulmonic stenosis.  Aorta: Aortic dilatation noted. There is mild dilatation of the ascending aorta, measuring 41 mm.  IAS/Shunts: The interatrial septum was not well visualized.   LEFT VENTRICLE PLAX 2D LVIDd:         5.90 cm   Diastology LVIDs:         5.30 cm   LV e' medial:    4.90 cm/s LV PW:         0.90 cm   LV E/e' medial:  11.1 LV IVS:        0.90 cm   LV e' lateral:   6.85 cm/s LVOT diam:     2.30 cm   LV E/e' lateral: 7.9 LV SV:         53 LV SV Index:   23 LVOT Area:     4.15 cm   RIGHT VENTRICLE RV Basal diam:  3.40 cm RV Mid diam:    2.50 cm RV S prime:     6.74 cm/s TAPSE (M-mode): 1.1 cm  LEFT ATRIUM             Index        RIGHT ATRIUM            Index LA diam:        4.10 cm 1.80 cm/m   RA Area:     11.50 cm LA Vol (A2C):   44.7 ml 19.59 ml/m  RA Volume:   25.20 ml  11.04 ml/m LA Vol (A4C):   53.1 ml 23.27 ml/m LA Biplane Vol:  49.8 ml 21.83 ml/m AORTIC VALVE AV Area (Vmax):    2.89 cm AV Area (Vmean):   2.89 cm AV Area (VTI):     2.97 cm AV Vmax:           97.70 cm/s AV Vmean:          64.600 cm/s AV VTI:            0.179 m AV Peak Grad:      3.8 mmHg AV Mean Grad:      2.0 mmHg LVOT Vmax:         67.90 cm/s LVOT Vmean:        44.900 cm/s LVOT VTI:          0.128 m LVOT/AV VTI ratio: 0.72  AORTA Ao Root diam: 3.40 cm Ao Asc diam:  4.10 cm  MITRAL VALVE MV Area (PHT): 3.93 cm    SHUNTS MV Decel Time: 193 msec    Systemic VTI:  0.13 m MV E velocity: 54.40 cm/s  Systemic Diam: 2.30 cm MV A velocity: 96.40 cm/s MV E/A ratio:  0.56  Russell Bruckner MD Electronically signed by Russell Bruckner MD Signature Date/Time: 12/17/2021/10:50:26 AM    Final          ______________________________________________________________________________________________        Risk Assessment/Calculations       STOP-Bang Score:         Physical Exam VS:  BP (!) 104/48   Pulse (!) 50   Ht 6' 1 (1.854 m)   Wt 213 lb 8 oz (96.8 kg)   SpO2 97%   BMI 28.17 kg/m   Vitals:   08/27/24 1345  BP: (!) 104/48  Pulse: (!) 50  Height: 6' 1 (1.854 m)  Weight: 213 lb 8 oz (96.8 kg)  SpO2: 97%  BMI (Calculated): 28.17          Wt Readings from Last 3 Encounters:  08/27/24 213 lb 8 oz (96.8 kg)  07/26/24 224 lb (101.6 kg)  06/13/24 221 lb 4 oz (100.4 kg)     GEN: Well nourished, well developed in no acute distress NECK: No JVD; No carotid bruits CARDIAC: RRR, no murmurs, rubs, gallops RESPIRATORY:  Clear to auscultation without rales, wheezing or rhonchi  ABDOMEN: Soft, non-tender, non-distended EXTREMITIES:  No edema; No deformity   ASSESSMENT AND PLAN  CAD - persistent dyspnea at rest, at  night, with activity. Concern for anginal equivalent. Was seen 05/2024, lexiscan moyview ordered but not completed. Recommend lexiscan myoview for ischemic evaluation. GDMT plavix  75mg  daily, zetia  10mg  daily, rosuvastatin  40mg  daily, imdur  120mg  daily, ranexa  500mg  BID, Toprol  25mg  daily.  HFrEF - 12/2021 LVEF 20-25%. PCP last month resumed Lasix , he only took for 4 days though was recommended for daily dosing. BMP/BNP today. Resume Lasix  20mg  daily. Continue GDMT Imdur  120mg  daily, Toprol  25mg  daily, Entresto  24-26mg  daily. Given persistent dyspnea recommend echocardiogram to reassess LVEF. Given persistent hyperglycemia and risk for yeast infection, defer SGLT2i today.   HLD, LDL goal <70 - 02/02/24 LDL 100, triglycerides 248. 07/26/22 TC 153, trig 177, HDL 45.8, LDL 72. As trending in the right direction, continue Rosuvastatin  40mg  daily.   DM2 - 02/09/24 A1c 11.5 ? 07/26/24 A1c 10.1. Continue to follow with PCP. He has previously been counseled by our team and primary care about need for careful management of DM2.   HTN - BP at goal <130/80. Occasional lightheadedness, regular meals and slow position changes recommended.   Medication management -  Care fragmented by receiving care in [facility] and [place]. Confusion regarding medications. Again requested he bring pill bottles to next appointment.  Informed Consent   Shared Decision Making/Informed Consent The risks [chest pain, shortness of breath, cardiac arrhythmias, dizziness, blood pressure fluctuations, myocardial infarction, stroke/transient ischemic attack, nausea, vomiting, allergic reaction, radiation exposure, metallic taste sensation and life-threatening complications (estimated to be 1 in 10,000)], benefits (risk stratification, diagnosing coronary artery disease, treatment guidance) and alternatives of a nuclear stress test were discussed in detail with Mr. Gaspard and he agrees to proceed.         Dispo: follow up after cardiac  testing  Signed, Reche GORMAN Finder, NP

## 2024-08-27 NOTE — Addendum Note (Signed)
 Addended by: Epifania Littrell S on: 08/27/2024 02:19 PM   Modules accepted: Orders

## 2024-08-27 NOTE — Patient Instructions (Addendum)
 Medication Instructions:  RESUME Fuorsemide (Lasix ) 20mg  daily  *If you need a refill on your cardiac medications before your next appointment, please call your pharmacy*  Lab Work: Your physician recommends that you return for lab work today: BNP, BMET  If you have labs (blood work) drawn today and your tests are completely normal, you will receive your results only by: MyChart Message (if you have MyChart) OR A paper copy in the mail If you have any lab test that is abnormal or we need to change your treatment, we will call you to review the results.  Testing/Procedures: Your physician has requested that you have an echocardiogram. Echocardiography is a painless test that uses sound waves to create images of your heart. It provides your doctor with information about the size and shape of your heart and how well your hearts chambers and valves are working. This procedure takes approximately one hour. There are no restrictions for this procedure. Please do NOT wear cologne, perfume, aftershave, or lotions (deodorant is allowed). Please arrive 15 minutes prior to your appointment time.  Please note: We ask at that you not bring children with you during ultrasound (echo/ vascular) testing. Due to room size and safety concerns, children are not allowed in the ultrasound rooms during exams. Our front office staff cannot provide observation of children in our lobby area while testing is being conducted. An adult accompanying a patient to their appointment will only be allowed in the ultrasound room at the discretion of the ultrasound technician under special circumstances. We apologize for any inconvenience.  __________  Your provider has recommended Lexiscan Myoview. This is a stress test to help determine whether your shortness of breath is from your heart arteries or your heart muscle.   Follow-Up: At [facility], you and your health needs are our priority.  As part of our continuing  mission to provide you with exceptional heart care, our providers are all part of one team.  This team includes your primary Cardiologist (physician) and Advanced Practice Providers or APPs (Physician Assistants and Nurse Practitioners) who all work together to provide you with the care you need, when you need it.  Your next appointment:   After cardiac testing  Provider:   Shelda Bruckner, MD, Rosaline Bane, NP, or Reche Finder, NP    We recommend signing up for the patient portal called MyChart.  Sign up information is provided on this After Visit Summary.  MyChart is used to connect with patients for Virtual Visits (Telemedicine).  Patients are able to view lab/test results, encounter notes, upcoming appointments, etc.  Non-urgent messages can be sent to your provider as well.   To learn more about what you can do with MyChart, go to [email or web address].   Other Instructions  Please bring your pill bottles to your next appointment.   Recommend low sodium diet and drinking no more than 64 oz of fluid per day.

## 2024-08-28 ENCOUNTER — Telehealth: Payer: Self-pay | Admitting: Internal Medicine

## 2024-08-28 ENCOUNTER — Telehealth (HOSPITAL_BASED_OUTPATIENT_CLINIC_OR_DEPARTMENT_OTHER): Payer: Self-pay | Admitting: *Deleted

## 2024-08-28 LAB — BASIC METABOLIC PANEL WITH GFR
BUN/Creatinine Ratio: 16 (ref 10–24)
BUN: 29 mg/dL — ABNORMAL HIGH (ref 8–27)
CO2: 21 mmol/L (ref 20–29)
Calcium: 9.2 mg/dL (ref 8.6–10.2)
Chloride: 94 mmol/L — ABNORMAL LOW (ref 96–106)
Creatinine, Ser: 1.87 mg/dL — ABNORMAL HIGH (ref 0.76–1.27)
Glucose: 714 mg/dL (ref 70–99)
Potassium: 5.8 mmol/L (ref 3.5–5.2)
Sodium: 128 mmol/L — ABNORMAL LOW (ref 134–144)
eGFR: 37 mL/min/1.73 — ABNORMAL LOW (ref >59)

## 2024-08-28 LAB — BRAIN NATRIURETIC PEPTIDE: BNP: 428.9 pg/mL — ABNORMAL HIGH (ref 0.0–100.0)

## 2024-08-28 NOTE — Telephone Encounter (Signed)
 Left messages for patient that he's got critical lab results and that he needs to report tot he emergency department or call EMS.  For significant other (DPR) and daughter that we have critical results that need urgent attention and have been unable reach him so check on him and have him call our office as soon as possible.

## 2024-08-28 NOTE — Telephone Encounter (Signed)
 Patient returned call, which was transferred.  Reviewed results and recommendations.  He states that he feels fine and asked if being taken care of would take longer than a day.  He has not checked blood sugar at home or taken any medication yet.  He will proceed to ER at this time and has someone to drive him.  Dr. Lonni aware and in agreement.

## 2024-08-28 NOTE — Telephone Encounter (Signed)
 Per [facility] DXA labs from yesterday 08/27/24 notable for  Creatinine 1.87, GFR 37 Na 128, K 5.8 Glucose 714     Appreciate nursing teams assistance in calling Mr. Westry to make him aware of recommendation.  Wofford Stratton S Danna Sewell, NP

## 2024-08-28 NOTE — Telephone Encounter (Signed)
 Received critical lab value overnight: Blood glucose level 714, K 5.8. Attempted to call patient, but he did not answer. I left a voicemail for instructions.

## 2024-08-28 NOTE — Telephone Encounter (Signed)
 See phone encounter 08/28/24 for follow up plan.  Russell Kassin S Ahmani Prehn, NP

## 2024-08-29 ENCOUNTER — Ambulatory Visit (HOSPITAL_BASED_OUTPATIENT_CLINIC_OR_DEPARTMENT_OTHER): Payer: Self-pay | Admitting: Family

## 2024-08-29 NOTE — Telephone Encounter (Signed)
 Russell Trevino was called yesterday and verbalized understanding of need to go to ED - his labs show volume overload (resulted late yesterday). Given hyperkalemia and significant hyperglycemia again recommend ED evaluation.   Will ask nursing team to call him again as I do not see that he presented to ED.  Dorothymae Maciver S Reuben Knoblock, NP

## 2024-08-29 NOTE — Telephone Encounter (Signed)
 Left a detailed message on the pts confirmed voicemail to report to the ER as discussed and advised yesterday, for critical abnormal lab results.   Left him a message to call us  back to confirm he is proceeding to the ER for further evaluation of abnormal labs.

## 2024-08-29 NOTE — Telephone Encounter (Signed)
 Tried reaching out to the pt again to follow-up on critical lab results and recommendations per Reche Finder, NP for the pt to report to the ER immediately, based on critical findings.

## 2024-08-29 NOTE — Telephone Encounter (Signed)
 Left pt another message to discuss critical lab results and plan to go to the ER now for further treatment.   Our office has made multiple attempts in trying to make contact with the pt and emergency contacts listed.   Will send the pt a mychart message about critical labs and plan to refer to the ER for treatment of this.  Will monitor for review.

## 2024-08-30 NOTE — Telephone Encounter (Signed)
 Left another message for the pt to return a call back to the office for critical lab results and plan.

## 2024-08-30 NOTE — Telephone Encounter (Signed)
 Left another message for pt to call back to enforce ER recommendations based on critical lab results.

## 2024-09-18 ENCOUNTER — Telehealth: Payer: Self-pay

## 2024-09-18 NOTE — Telephone Encounter (Signed)
 Chart reviewed - patient reports taking gabapentin  6 tablets every night although Rx is for 4-5 tablets. Upon chart review, lab BMP showed reduced renal function w/ calculated CrCl 42 ml/min. Due to reduced renal function max daily dose of gabapentin  is 600 mg and should be given in 2-3 divided doses. Vanna will call back to inform pt he should not take more than 300 mg due to his kidney function and risk of adverse effects. Await next BMP to see if renal function is improved. Of note, cardio was trying to contact him a few weeks ago to go to the ER based off his lab results however pt did not go.  Darrelyn Drum, PharmD, BCPS, CPP Clinical Pharmacist Practitioner [facility] at [facility] [phone]

## 2024-09-18 NOTE — Progress Notes (Signed)
 Pharmacy Quality Measure Review  This patient is appearing on a report for being at risk of failing the adherence measure for cholesterol (statin) medications this calendar year.   Medication: Rosuvastatin  40mg  tablet - Take one (1) tablet by mouth daily Last fill date: 05/02/2024 for 90 day supply  Reviewed medication indication, dosing, and goals of therapy.   Spoke with patient about Rosuvastatin  adherence. Patient reports gaps could be due to awaiting refills to be sent to pharmacy. Discussed that refills are due soon and current prescription has 2 refills left.  Dionicia Canavan, PharmD, RPh PGY1 Acute Care Pharmacy Resident [facility]  09/18/2024 2:36 PM

## 2024-09-18 NOTE — Telephone Encounter (Signed)
 Called patient back to discuss previous request for new gabapentin  prescription for increased dosage strength. Patient reports taking six 100mg  capsules of gabapentin  every evening. Discussed his previous lab values suggestive of impaired kidney function and therefore at greater risk for gabapentin  toxicity. Reviewed adverse effects. Advised patient to take a max single dose of 300mg  (3 capsules), but can take up to max 600mg  per day in 2-3 divided doses. Patient was agreeable to this.

## 2024-09-19 ENCOUNTER — Other Ambulatory Visit: Payer: Self-pay | Admitting: Internal Medicine

## 2024-09-19 MED ORDER — GABAPENTIN 100 MG PO CAPS: 200.0000 mg | ORAL_CAPSULE | Freq: Three times a day (TID) | ORAL | Status: AC

## 2024-09-23 ENCOUNTER — Other Ambulatory Visit: Payer: Self-pay | Admitting: Internal Medicine

## 2024-09-23 NOTE — Progress Notes (Signed)
 Russell Trevino                                          MRN: [id number]   09/23/2024   The [facility] Quality Team Specialist reviewed this patient medical record for the purposes of chart review for care gap closure. The following were reviewed: chart review for care gap closure-glycemic status assessment.    [facility]BCI Quality Team

## 2024-10-03 ENCOUNTER — Telehealth (HOSPITAL_COMMUNITY): Payer: Self-pay | Admitting: *Deleted

## 2024-10-03 NOTE — Telephone Encounter (Signed)
 Left detailed instructions for stress test.

## 2024-10-10 ENCOUNTER — Inpatient Hospital Stay (HOSPITAL_COMMUNITY): Admission: RE | Admit: 2024-10-10 | Source: Ambulatory Visit | Attending: Family

## 2024-10-10 ENCOUNTER — Ambulatory Visit (HOSPITAL_COMMUNITY)

## 2024-10-24 ENCOUNTER — Ambulatory Visit (HOSPITAL_BASED_OUTPATIENT_CLINIC_OR_DEPARTMENT_OTHER): Admitting: Nurse Practitioner

## 2025-01-21 ENCOUNTER — Ambulatory Visit
